# Patient Record
Sex: Male | Born: 1960
Health system: Southern US, Community
[De-identification: ages and names within clinical notes are randomized; demographics above are authoritative.]

## PROBLEM LIST (undated history)

## (undated) DIAGNOSIS — F329 Major depressive disorder, single episode, unspecified: Secondary | ICD-10-CM

## (undated) DIAGNOSIS — G4730 Sleep apnea, unspecified: Secondary | ICD-10-CM

## (undated) DIAGNOSIS — K429 Umbilical hernia without obstruction or gangrene: Secondary | ICD-10-CM

## (undated) DIAGNOSIS — K922 Gastrointestinal hemorrhage, unspecified: Secondary | ICD-10-CM

## (undated) DIAGNOSIS — G8929 Other chronic pain: Secondary | ICD-10-CM

## (undated) DIAGNOSIS — J449 Chronic obstructive pulmonary disease, unspecified: Secondary | ICD-10-CM

## (undated) DIAGNOSIS — M1990 Unspecified osteoarthritis, unspecified site: Secondary | ICD-10-CM

## (undated) DIAGNOSIS — J189 Pneumonia, unspecified organism: Secondary | ICD-10-CM

## (undated) DIAGNOSIS — R0600 Dyspnea, unspecified: Secondary | ICD-10-CM

## (undated) DIAGNOSIS — R490 Dysphonia: Secondary | ICD-10-CM

## (undated) DIAGNOSIS — I7121 Aneurysm of the ascending aorta, without rupture: Secondary | ICD-10-CM

## (undated) DIAGNOSIS — J986 Disorders of diaphragm: Secondary | ICD-10-CM

## (undated) DIAGNOSIS — I10 Essential (primary) hypertension: Secondary | ICD-10-CM

## (undated) DIAGNOSIS — G35 Multiple sclerosis: Secondary | ICD-10-CM

## (undated) DIAGNOSIS — F419 Anxiety disorder, unspecified: Secondary | ICD-10-CM

## (undated) DIAGNOSIS — G709 Myoneural disorder, unspecified: Secondary | ICD-10-CM

## (undated) DIAGNOSIS — K5903 Drug induced constipation: Secondary | ICD-10-CM

## (undated) DIAGNOSIS — T402X5A Adverse effect of other opioids, initial encounter: Secondary | ICD-10-CM

## (undated) DIAGNOSIS — F32A Depression, unspecified: Secondary | ICD-10-CM

## (undated) HISTORY — DX: Other chronic pain: G89.29

## (undated) HISTORY — PX: TONSILLECTOMY: SUR1361

## (undated) HISTORY — DX: Essential (primary) hypertension: I10

## (undated) HISTORY — DX: Depression, unspecified: F32.A

## (undated) HISTORY — DX: Dysphonia: R49.0

## (undated) HISTORY — DX: Sleep apnea, unspecified: G47.30

## (undated) HISTORY — DX: Morbid (severe) obesity due to excess calories: E66.01

## (undated) HISTORY — DX: Anxiety disorder, unspecified: F41.9

## (undated) HISTORY — PX: COLONOSCOPY W/ POLYPECTOMY: SHX1380

## (undated) HISTORY — DX: Major depressive disorder, single episode, unspecified: F32.9

## (undated) HISTORY — PX: MULTIPLE TOOTH EXTRACTIONS: SHX2053

## (undated) HISTORY — PX: SPINE SURGERY: SHX786

## (undated) HISTORY — DX: Multiple sclerosis: G35

## (undated) HISTORY — PX: ESOPHAGOGASTRODUODENOSCOPY: SHX1529

## (undated) HISTORY — DX: Chronic obstructive pulmonary disease, unspecified: J44.9

## (undated) HISTORY — PX: CARPAL TUNNEL RELEASE: SHX101

---

## 2002-03-16 ENCOUNTER — Encounter: Admission: RE | Admit: 2002-03-16 | Discharge: 2002-03-16 | Payer: Self-pay

## 2002-03-16 ENCOUNTER — Encounter: Payer: Self-pay | Admitting: Family Medicine

## 2002-03-30 ENCOUNTER — Encounter: Admission: RE | Admit: 2002-03-30 | Discharge: 2002-03-30 | Payer: Self-pay | Admitting: Family Medicine

## 2002-03-30 ENCOUNTER — Encounter: Payer: Self-pay | Admitting: Family Medicine

## 2003-01-22 ENCOUNTER — Emergency Department (HOSPITAL_COMMUNITY): Admission: EM | Admit: 2003-01-22 | Discharge: 2003-01-22 | Payer: Self-pay | Admitting: Emergency Medicine

## 2003-01-22 ENCOUNTER — Encounter: Payer: Self-pay | Admitting: Emergency Medicine

## 2003-01-31 ENCOUNTER — Encounter: Admission: RE | Admit: 2003-01-31 | Discharge: 2003-01-31 | Payer: Self-pay | Admitting: Neurosurgery

## 2003-01-31 ENCOUNTER — Encounter: Payer: Self-pay | Admitting: Neurosurgery

## 2003-05-04 ENCOUNTER — Encounter: Payer: Self-pay | Admitting: Neurosurgery

## 2003-05-04 ENCOUNTER — Ambulatory Visit (HOSPITAL_COMMUNITY): Admission: RE | Admit: 2003-05-04 | Discharge: 2003-05-05 | Payer: Self-pay | Admitting: Neurosurgery

## 2003-05-04 ENCOUNTER — Encounter (INDEPENDENT_AMBULATORY_CARE_PROVIDER_SITE_OTHER): Payer: Self-pay | Admitting: *Deleted

## 2003-07-05 ENCOUNTER — Encounter: Admission: RE | Admit: 2003-07-05 | Discharge: 2003-07-05 | Payer: Self-pay | Admitting: Neurosurgery

## 2003-10-03 ENCOUNTER — Encounter: Admission: RE | Admit: 2003-10-03 | Discharge: 2003-10-03 | Payer: Self-pay | Admitting: Neurosurgery

## 2004-01-11 ENCOUNTER — Encounter: Admission: RE | Admit: 2004-01-11 | Discharge: 2004-01-11 | Payer: Self-pay | Admitting: Neurosurgery

## 2004-02-08 ENCOUNTER — Encounter: Admission: RE | Admit: 2004-02-08 | Discharge: 2004-02-08 | Payer: Self-pay | Admitting: Neurosurgery

## 2004-04-04 ENCOUNTER — Emergency Department (HOSPITAL_COMMUNITY): Admission: EM | Admit: 2004-04-04 | Discharge: 2004-04-04 | Payer: Self-pay | Admitting: Emergency Medicine

## 2004-06-01 ENCOUNTER — Encounter: Admission: RE | Admit: 2004-06-01 | Discharge: 2004-06-01 | Payer: Self-pay | Admitting: Neurosurgery

## 2004-06-03 ENCOUNTER — Encounter: Admission: RE | Admit: 2004-06-03 | Discharge: 2004-06-03 | Payer: Self-pay | Admitting: Neurosurgery

## 2004-11-12 ENCOUNTER — Inpatient Hospital Stay (HOSPITAL_COMMUNITY): Admission: RE | Admit: 2004-11-12 | Discharge: 2004-11-13 | Payer: Self-pay | Admitting: Neurosurgery

## 2005-01-03 ENCOUNTER — Encounter: Admission: RE | Admit: 2005-01-03 | Discharge: 2005-01-03 | Payer: Self-pay | Admitting: Neurosurgery

## 2005-04-02 ENCOUNTER — Encounter: Admission: RE | Admit: 2005-04-02 | Discharge: 2005-04-02 | Payer: Self-pay | Admitting: Neurosurgery

## 2005-07-02 ENCOUNTER — Encounter: Admission: RE | Admit: 2005-07-02 | Discharge: 2005-07-02 | Payer: Self-pay | Admitting: Neurosurgery

## 2005-10-29 ENCOUNTER — Encounter: Admission: RE | Admit: 2005-10-29 | Discharge: 2005-10-29 | Payer: Self-pay | Admitting: Neurosurgery

## 2005-12-20 ENCOUNTER — Encounter: Admission: RE | Admit: 2005-12-20 | Discharge: 2005-12-20 | Payer: Self-pay | Admitting: Neurosurgery

## 2006-01-27 ENCOUNTER — Ambulatory Visit: Admission: RE | Admit: 2006-01-27 | Discharge: 2006-01-27 | Payer: Self-pay | Admitting: Neurosurgery

## 2006-02-20 ENCOUNTER — Encounter: Admission: RE | Admit: 2006-02-20 | Discharge: 2006-02-20 | Payer: Self-pay | Admitting: Neurosurgery

## 2006-02-24 ENCOUNTER — Ambulatory Visit: Payer: Self-pay | Admitting: Family Medicine

## 2006-02-27 ENCOUNTER — Encounter: Admission: RE | Admit: 2006-02-27 | Discharge: 2006-02-27 | Payer: Self-pay | Admitting: Neurosurgery

## 2006-05-21 ENCOUNTER — Encounter: Admission: RE | Admit: 2006-05-21 | Discharge: 2006-05-21 | Payer: Self-pay | Admitting: Neurosurgery

## 2006-08-26 ENCOUNTER — Encounter: Admission: RE | Admit: 2006-08-26 | Discharge: 2006-08-26 | Payer: Self-pay | Admitting: Neurosurgery

## 2006-09-09 ENCOUNTER — Encounter: Admission: RE | Admit: 2006-09-09 | Discharge: 2006-09-09 | Payer: Self-pay | Admitting: Neurosurgery

## 2007-01-07 ENCOUNTER — Inpatient Hospital Stay (HOSPITAL_COMMUNITY): Admission: RE | Admit: 2007-01-07 | Discharge: 2007-01-10 | Payer: Self-pay | Admitting: Neurosurgery

## 2007-02-05 ENCOUNTER — Encounter: Admission: RE | Admit: 2007-02-05 | Discharge: 2007-02-05 | Payer: Self-pay | Admitting: Neurosurgery

## 2007-03-19 ENCOUNTER — Encounter: Admission: RE | Admit: 2007-03-19 | Discharge: 2007-03-19 | Payer: Self-pay | Admitting: Neurosurgery

## 2007-05-21 ENCOUNTER — Encounter: Admission: RE | Admit: 2007-05-21 | Discharge: 2007-05-21 | Payer: Self-pay | Admitting: Neurosurgery

## 2007-05-27 ENCOUNTER — Encounter: Admission: RE | Admit: 2007-05-27 | Discharge: 2007-05-27 | Payer: Self-pay | Admitting: Neurosurgery

## 2007-08-03 ENCOUNTER — Ambulatory Visit: Payer: Self-pay | Admitting: *Deleted

## 2007-08-03 DIAGNOSIS — R609 Edema, unspecified: Secondary | ICD-10-CM | POA: Insufficient documentation

## 2007-08-04 ENCOUNTER — Encounter: Payer: Self-pay | Admitting: Family Medicine

## 2007-08-05 ENCOUNTER — Encounter: Payer: Self-pay | Admitting: Family Medicine

## 2007-08-05 LAB — CONVERTED CEMR LAB
Chloride: 101 meq/L (ref 96–112)
Glucose, Bld: 109 mg/dL — ABNORMAL HIGH (ref 70–99)
Potassium: 4 meq/L (ref 3.5–5.3)
Sodium: 139 meq/L (ref 135–145)
TSH: 0.757 microintl units/mL (ref 0.350–5.50)

## 2007-08-10 ENCOUNTER — Ambulatory Visit: Payer: Self-pay | Admitting: Family Medicine

## 2007-08-10 DIAGNOSIS — N319 Neuromuscular dysfunction of bladder, unspecified: Secondary | ICD-10-CM | POA: Insufficient documentation

## 2007-08-12 ENCOUNTER — Telehealth (INDEPENDENT_AMBULATORY_CARE_PROVIDER_SITE_OTHER): Payer: Self-pay | Admitting: *Deleted

## 2007-08-12 ENCOUNTER — Ambulatory Visit: Payer: Self-pay | Admitting: Family Medicine

## 2007-08-12 LAB — CONVERTED CEMR LAB
Albumin: 4.5 g/dL (ref 3.5–5.2)
Bilirubin Urine: NEGATIVE
Glucose, Urine, Semiquant: NEGATIVE
Specific Gravity, Urine: 1.015
WBC Urine, dipstick: NEGATIVE
pH: 6.5

## 2007-08-13 ENCOUNTER — Telehealth: Payer: Self-pay | Admitting: Family Medicine

## 2007-08-14 ENCOUNTER — Telehealth: Payer: Self-pay | Admitting: Family Medicine

## 2007-08-14 ENCOUNTER — Encounter: Payer: Self-pay | Admitting: Family Medicine

## 2007-08-14 LAB — CONVERTED CEMR LAB
Basophils Absolute: 0 10*3/uL (ref 0.0–0.1)
Basophils Relative: 0 % (ref 0–1)
Eosinophils Relative: 5 % (ref 0–5)
HCT: 45.3 % (ref 39.0–52.0)
Hemoglobin: 14.8 g/dL (ref 13.0–17.0)
MCHC: 32.7 g/dL (ref 30.0–36.0)
Monocytes Absolute: 0.1 10*3/uL (ref 0.1–1.0)
RDW: 12.8 % (ref 11.5–15.5)

## 2007-08-18 ENCOUNTER — Ambulatory Visit: Payer: Self-pay | Admitting: Family Medicine

## 2007-08-18 DIAGNOSIS — M542 Cervicalgia: Secondary | ICD-10-CM | POA: Insufficient documentation

## 2007-08-18 DIAGNOSIS — I872 Venous insufficiency (chronic) (peripheral): Secondary | ICD-10-CM | POA: Insufficient documentation

## 2007-08-18 DIAGNOSIS — F418 Other specified anxiety disorders: Secondary | ICD-10-CM | POA: Insufficient documentation

## 2007-08-18 DIAGNOSIS — F329 Major depressive disorder, single episode, unspecified: Secondary | ICD-10-CM | POA: Insufficient documentation

## 2007-08-22 ENCOUNTER — Ambulatory Visit (HOSPITAL_COMMUNITY): Admission: RE | Admit: 2007-08-22 | Discharge: 2007-08-22 | Payer: Self-pay | Admitting: Neurosurgery

## 2007-08-22 ENCOUNTER — Encounter: Admission: RE | Admit: 2007-08-22 | Discharge: 2007-08-22 | Payer: Self-pay | Admitting: Neurosurgery

## 2007-09-17 ENCOUNTER — Encounter: Payer: Self-pay | Admitting: Family Medicine

## 2007-10-02 ENCOUNTER — Encounter: Payer: Self-pay | Admitting: Family Medicine

## 2007-11-17 ENCOUNTER — Encounter: Payer: Self-pay | Admitting: Family Medicine

## 2007-11-19 ENCOUNTER — Encounter: Payer: Self-pay | Admitting: Family Medicine

## 2008-01-14 ENCOUNTER — Encounter: Admission: RE | Admit: 2008-01-14 | Discharge: 2008-01-14 | Payer: Self-pay | Admitting: Family Medicine

## 2008-06-28 ENCOUNTER — Encounter: Admission: RE | Admit: 2008-06-28 | Discharge: 2008-06-28 | Payer: Self-pay | Admitting: Neurosurgery

## 2008-07-02 ENCOUNTER — Encounter: Admission: RE | Admit: 2008-07-02 | Discharge: 2008-07-02 | Payer: Self-pay | Admitting: Neurosurgery

## 2008-08-01 ENCOUNTER — Ambulatory Visit: Payer: Self-pay | Admitting: Surgery

## 2008-08-02 ENCOUNTER — Ambulatory Visit: Payer: Self-pay | Admitting: Vascular Surgery

## 2008-09-11 ENCOUNTER — Ambulatory Visit: Payer: Self-pay | Admitting: Diagnostic Radiology

## 2008-09-11 ENCOUNTER — Emergency Department (HOSPITAL_BASED_OUTPATIENT_CLINIC_OR_DEPARTMENT_OTHER): Admission: EM | Admit: 2008-09-11 | Discharge: 2008-09-11 | Payer: Self-pay | Admitting: Emergency Medicine

## 2008-11-19 ENCOUNTER — Encounter: Admission: RE | Admit: 2008-11-19 | Discharge: 2008-11-19 | Payer: Self-pay | Admitting: Neurosurgery

## 2009-06-11 ENCOUNTER — Encounter: Admission: RE | Admit: 2009-06-11 | Discharge: 2009-06-11 | Payer: Self-pay | Admitting: Anesthesiology

## 2009-12-20 ENCOUNTER — Encounter: Admission: RE | Admit: 2009-12-20 | Discharge: 2009-12-20 | Payer: Self-pay | Admitting: Neurosurgery

## 2009-12-27 ENCOUNTER — Encounter: Admission: RE | Admit: 2009-12-27 | Discharge: 2009-12-27 | Payer: Self-pay | Admitting: Neurosurgery

## 2010-01-31 ENCOUNTER — Ambulatory Visit (HOSPITAL_COMMUNITY): Admission: RE | Admit: 2010-01-31 | Discharge: 2010-02-01 | Payer: Self-pay | Admitting: Neurosurgery

## 2010-03-06 ENCOUNTER — Encounter: Admission: RE | Admit: 2010-03-06 | Discharge: 2010-03-06 | Payer: Self-pay | Admitting: Neurosurgery

## 2010-04-05 ENCOUNTER — Encounter: Admission: RE | Admit: 2010-04-05 | Discharge: 2010-04-05 | Payer: Self-pay | Admitting: Neurosurgery

## 2010-08-12 ENCOUNTER — Encounter: Payer: Self-pay | Admitting: Neurosurgery

## 2010-10-07 LAB — CBC
MCHC: 34.8 g/dL (ref 30.0–36.0)
RDW: 13.6 % (ref 11.5–15.5)

## 2010-10-07 LAB — SURGICAL PCR SCREEN
MRSA, PCR: NEGATIVE
Staphylococcus aureus: NEGATIVE

## 2010-12-04 NOTE — Procedures (Signed)
DUPLEX DEEP VENOUS EXAM - LOWER EXTREMITY   INDICATION:  Bilateral lower extremity edema.   HISTORY:  Edema:  Bilateral lower extremity edema since 2008.  Trauma/Surgery:  No.  Pain:  Bilateral lower extremity pain.  PE:  No.  Previous DVT:  No.  Anticoagulants:  No.  Other:  Patient claims that prednisone resolves the edema in his legs.   DUPLEX EXAM:                CFV   SFV   PopV  PTV    GSV                R  L  R  L  R  L  R   L  R  L  Thrombosis    o  o  o  o  o  o  o   o  o  o  Spontaneous   +  +  +  +  +  +  +   +  +  +  Phasic        +  +  +  +  +  +  +   +  +  +  Augmentation  +  +  +  +  +  +  +   +  +  +  Compressible  +  +  +  +  +  +  +   +  +  +  Competent     +  o  +  o  +  +  +   +  +  P   Legend:  + - yes  o - no  p - partial  D - decreased   IMPRESSION:  1. No evidence of deep or superficial vein thrombosis.  No evidence of      baker's cyst bilaterally.  2. Deep vein incompetence is seen in the left common femoral vein and      left superficial femoral vein.    _____________________________  V. Charlena Cross, MD   MC/MEDQ  D:  08/01/2008  T:  08/01/2008  Job:  [id number]

## 2010-12-04 NOTE — Op Note (Signed)
NAME:  Mark [name], Mark [name]           ACCOUNT NO.:  [id number]   MEDICAL RECORD NO.:  [id number]          PATIENT TYPE:  INP   LOCATION:  3172                         FACILITY:  [facility]   PHYSICIAN:  Donalee Citrin, M.D.        DATE OF BIRTH:  02/16/1961   DATE OF PROCEDURE:  01/07/2007  DATE OF DISCHARGE:                               OPERATIVE REPORT   PREOPERATIVE DIAGNOSIS:  Degenerative disc disease, spinal instability  L4-L5, with bilateral L5 radiculopathy.   POSTOPERATIVE DIAGNOSIS:  Degenerative disc disease, spinal instability  L4-L5, with bilateral L5 radiculopathy.   PROCEDURE:  Re-exploration of lumbar fusion L5-S1, removal of hardware  L5-S1, decompressive lumbar laminotomy at L4-L5, posterior lumbar  interbody fusion L4-L5 using a Hybrid Telamon 12 x 22 mm PEEK cage  packed with locally harvested bone graft mixed with Progenics bone  substitute as well as a Tangent 12 x 26 mm allograft wedge, pedicle  screw fixation L4 to S1 using the 6.35 Legacy pedicle screw system,  posterolateral arthrodesis, L4 to S1, using locally harvested bone mixed  with bone substitute   SURGEON:  Donalee Citrin, M.D.   ASSISTANT:  Tia Alert, M.D.   ANESTHESIA:  General endotracheal anesthesia.   HISTORY OF PRESENT ILLNESS:  The patient is a very pleasant 50 year old  gentleman who underwent L5-S1 interbody fusion a couple of years ago.  He did very well initially; however, about a year postop started  developing progressive worsening back pain refractory to all forms of  treatment  including facet injections, epidural steroid injections,  physical therapy, anti-inflammatories, narcotic pain medication. Repeat  imaging with MRI scan diskography showed concordant responses at L4-L5  with solid bony fusion at L5-S1 and a negative response at L3-L4.  Due  to the patient's failure with conservative treatment, diskogram and  imaging findings, the patient is recommended extension of his fusion to  L4-L5 and exploration of his fusion at L5-S1.  The risks and benefits of  the operation were explained to the patient, he understood and agreed to  proceed forth.   DESCRIPTION OF PROCEDURE:  The patient was brought to the OR and was  induced under general anesthesia.  He was placed prone on the Wilson  frame.  The back was prepped and draped in the usual sterile fashion.  No preoperative was needed.  The old incision was opened and extended  cephalad. Scar tissue was dissected free and the subperiosteal  dissection carried out at the lamina and spinous processes at L4  exposing L4.  Then, the hardware was identified at L5-S1. The fusion was  inspected and felt to be solid.  There was a lot of bony ingrowth on the  pedicle screws at L5-S1, the top were removed, and the rod was removed.  The patient requested leaving the S1 screws behind so this was left  behind.  Then, the spinous process at L4 was removed.  Extensive scar  tissue was dissected free at the L4-L5 disc space and complete medial  facetectomies were performed at L4-L5. There was a marked stenosis and  marked diastasis of the facet complex  as well as a synovial cyst coming  off the canalicular aspect of the L4-L5 facet on the right. After all  the foraminotomies were completed at L4 and L5, attention was turned to  the disc space.  A large central disc herniation was appreciated.  Annulotomy was made with an 11 blade scalpel on the right side.  This  was cleaned out. A size 10 distractor was inserted.  This was felt to be  inadequate so a size 12 distractor was then subsequently inserted.  This  had good apposition of the endplates and felt to be the appropriate size  for the interbody spacers.  Then, the left side disc space was cleaned  out using a size 12 cutter and chisel and fluoroscopy.  At each step  along the way, the interspace was cleaned out and prepared for the  allograft.  A 12 x 20 mm Telamon PEEK cage mixed with  locally harvested  bone graft mixed with bone substitute was packed in the left sided  interspace approximated 2-3 mm deep to posterior vertebral line.  Fluoroscopy confirmed good position of the interbody wedge.  Then, the  right side distractor was removed.  The disc space cleaned out in a  similar fashion, central disc was removed and scraped, and the remainder  of the locally harvested autograft was packed against the left sided  Telamon and the right sided Tangent wedge was inserted. After all  interbody space had been done, attention was turned to pedicle screw  placement.  A pilot hole was drilled, fluoroscopy confirming trajectory  and position and both intracanalicular and extracanalicular inspection  confirmed no mediolateral breach A 6.5 by 50 screw was inserted on the  left with a 6.5 by 50 inserted on the right subsequently. After all  screws had been inserted, aggressive irrigation was performed and  decortication with a high speed drill with T-piece and lateral gutters  down to the fusion mass at L5-S1. The remainder of the locally harvested  bone graft was packed in the lateral gutters.  A 70 mm rod was inserted  at S1 and L5. The L4 screw was then compressed against L5.  A cross-link  was inserted.  The foramina were reinspected prior to crossing placement  to confirm no migration of graft material and confirm patency. Gelfoam  was then overlaid in the dura.  The muscle and fascia were  reapproximated after placement of a medium Hemovac drain with  interrupted Vicryls and the skin was closed with interrupted Vicryl as  well with Dermabond, Steri-Strips and dressed. At the end of the case,  sponge, instrument, and needle counts were correct.           ______________________________  Donalee Citrin, M.D.     GC/MEDQ  D:  01/07/2007  T:  01/07/2007  Job:  [id number]

## 2010-12-04 NOTE — Assessment & Plan Note (Signed)
OFFICE VISIT   Mark [name]  DOB:  [birth year]                                       08/01/2008  EAVWU#:[id number]   REASON FOR VISIT:  Evaluate leg swelling.   HISTORY:  This is [name] 50 year old gentleman I am seeing at the request of  Dr. Hilma Favors for evaluation of bilateral leg swelling, right greater  than left.  The patient states that he initially began having problems  with leg swelling in December 2008.  He attributes this to taking  Levaquin for pneumonia.  The swelling has stayed about the same.  It has  not gotten or has not improved.  The right leg is worse than the left.  He has taken some prednisone in the past and this did help improve his  symptoms.  He has had minimal relief with diuretics.  He does not have  any ulceration.  Of note, he has had significant weight gain.   REVIEW OF SYSTEMS:  GENERAL:  Positive for weight gain.  He weighs 339  pounds.  CARDIAC:  Negative.  PULMONARY:  Positive for sleep apnea.  GI:  Positive for constipation.  GU:  Negative.  VASCULAR:  He has pain in the legs with walking.  NEURO:  Negative.  ORTHOPEDIC:  Positive for arthritis, joint pain, muscle pain.  PSYCHIATRIC:  Negative.  ENT:  Negative.  HEMATOLOGIC:  Negative.   PAST MEDICAL HISTORY:  Chronic back pain, edema, gout, impotence, carpal  tunnel syndrome, sleep apnea, benign prostatic hypertrophy.   FAMILY HISTORY:  Negative for cardiovascular disease.   SOCIAL HISTORY:  He is married with 2 children.  Works in an [employer]ing [employer].  Does not smoke, as never smoked.  Drinks occasional alcohol.   MEDICATIONS:  Hydrocodone, diazepam, diclofenac and Xenical.   ALLERGIES:  None.   PHYSICAL EXAMINATION:  Blood pressure is 134/80 pulse is 78, temperature  is 98.0.  General:  He is well-appearing, in no distress.  Normocephalic, atraumatic.  Cardiovascular:  Regular rate and rhythm.  Pulmonary:  Lungs are clear bilaterally.  Abdomen:  Obese.   Extremities  are warm and well-perfused.  Right leg has pitting edema up to the knee,  more pronounced than on the left leg.  There is no evidence of ischemia.  There is no ulceration.  He has mild pigment changes in the right gaiter  region.   DIAGNOSTIC STUDIES:  The patient underwent venous ultrasound today.  This reveals no evidence of deep venous thrombosis superficial venous  thrombosis or Baker cyst bilaterally.  There is deep vein incompetence  in the left common femoral vein and the left superficial femoral vein.   ASSESSMENT:  Bilateral leg swelling, right greater than left.   PLAN:  The patient most likely has [name] lymphatic etiology causing his  swelling.  For that reason I think we will need to treat this medically.  I have recommended that he begin wearing compression therapy 20-30 mmHg.  I also recommended that he see [name] lymphedema therapist for massage  therapy as well as encouraged him to keep his leg elevated whenever  possible.   He does have evidence of venous insufficiency.  However, this is not  something that can be treated with laser ablation as it is mainly in his  deep system.  We discussed the need to control his swelling as this  can  lead to ulcerations in the future.  I think he would benefit from weight  loss.  I do not think this is attributed to the Levaquin.   Mark Ny, MD  Electronically Signed   VWB/MEDQ  D:  08/01/2008  T:  08/02/2008  Job:  [id number]   cc:   Mark [name]

## 2010-12-04 NOTE — Discharge Summary (Signed)
NAME:  Mark [name], Mark [name]           ACCOUNT NO.:  [id number]   MEDICAL RECORD NO.:  [id number]          PATIENT TYPE:  INP   LOCATION:  3006                         FACILITY:  [facility]   PHYSICIAN:  Donalee Citrin, M.D.        DATE OF BIRTH:  March 18, 1961   DATE OF ADMISSION:  01/07/2007  DATE OF DISCHARGE:  01/10/2007                               DISCHARGE SUMMARY   ADMISSION DIAGNOSIS:  Degenerative disk disease, lumbar spinal stenosis  at L4-5.   PROCEDURE:  Decompressive lumbar laminectomy, posterior lumbar body  fusion, L4-5, evaluation of fusion, L5-S1, extension hardware.   HISTORY OF PRESENT ILLNESS:  Patient was admitted and went to the  operating room and underwent the aforementioned procedure.  Postop,  patient did very well, had significant improvement of his leg pain and  hip pain.  Back pain progressively got better.  Maintained control with  p.o. pain medications with pills and muscle relaxers over the first  couple of days postoperatively.   On day #3 postop, patient was ambulating and voiding spontaneously,  tolerating a regular diet.  Was able to be discharged home with  scheduled followup in two weeks.  At the time of discharge, patient was  ambulating, voiding, and tolerating p.o. pain medication for management.           ______________________________  Donalee Citrin, M.D.     GC/MEDQ  D:  02/05/2007  T:  02/05/2007  Job:  [id number]
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
## 2010-12-07 NOTE — Op Note (Signed)
NAME:  Mark [name], Mark [name]           ACCOUNT NO.:  [id number]   MEDICAL RECORD NO.:  [id number]          PATIENT TYPE:  INP   LOCATION:  2899                         FACILITY:  [facility]   PHYSICIAN:  Donalee Citrin, M.D.        DATE OF BIRTH:  08-19-1960   DATE OF PROCEDURE:  11/12/2004  DATE OF DISCHARGE:                                 OPERATIVE REPORT   PREOPERATIVE DIAGNOSES:  1.  Degenerative disk disease.  2.  Ruptured disk L5-S1, left greater than right with S1 radiculopathy.  3.  Mechanical and discogenic low back pain.   POSTOPERATIVE DIAGNOSES:  1.  Degenerative disk disease.  2.  Ruptured disk L5-S1, left greater than right with S1 radiculopathy.  3.  Mechanical and discogenic low back pain.   PROCEDURE:  Posterior lumbar interbody fusion L5-S1 using 10 x 26 mm  translaminar pedicle screw and fixation L5-S1 using the M10 Legacy pedicle  screw system.  Posterolateral arthrodesis L5-S1 using locally harvested  autograft. Placement of Hemovac drain.   SURGEON:  Donalee Citrin, M.D.   ASSISTANT:  Kathaleen Maser. Pool, M.D.   ANESTHESIA:  General endotracheal.   INDICATIONS FOR PROCEDURE:  The patient is a very pleasant, 50 year old  gentleman with longstanding back and left leg pain, refractory to all forms  of conservative treatment with physical therapy, facet injections, epidural  steroid injections, anti-inflammatories and time.  The patient has only  responded to the facet blocks but only for a temporary basis.  His  preoperative imaging showed severe degenerative collapse at the L5-S1 disk  space and large central disk rupture.  Due to the patient's failure of  conservative treatment, his symptoms of predominantly mechanical and  discogenic low back pain with secondary radiculopathy, the patient was  recommended decompressive stabilization procedure.   The risks and benefits of surgery were counseled with him previously.   DESCRIPTION OF PROCEDURE:  The patient was brought to the  OR, was inducted  under general anesthesia, positioned prone on the Wilson frame.  Back was  prepped and draped in the usual sterile fashion.  Preoperative x-ray  localized the L5-S1 disk space. After infiltrating with local anesthesia, a  midline incision was made, Bovie electrocautery was used to take down  subcutaneous tissues and subperiosteal dissection was carried out to lamina  of L4-5 and S1, exposing the TPs at L5 and S1.  Intraoperative x-ray  confirmed proper position.   The L5 pedicle, spinous process and laminar complexes were removed at L5 and  complete medial facectomies were performed. The facet was noted to be  markedly degenerated and hypertrophied. The ligament was also noted to be  hypertrophied. The L5 nerve root was noted to be under a tremendous amount  of stenosis due to the large, central disk bulge and collapse, closing off  the L5 neuroforaminal bilaterally, worse than the left. This was all under-  bent and opened up with a 3 mm Kerrison punch.  Lateral facet complex was  also under-bitten and the lateral disk space was exposed.  The S1 nerve root  was decompressed out of its foramen, flush with  the pedicle.   After both neuroforamina were opened up, attention was taken to the disk  space. Working using D'Errico nerve retractor, the right S1 nerve root was  reflected medially.  There was noted to be large, posterior osteophytic  spurs which were all scraped off and bitten away. The disk space was cleaned  out. The size 8 retractor was inserted. This kind of fish-mouthed and I  decided I wanted to replace it with a 10 distractor.  The 10 distractor had  good apposition of the endplates on the right. Then on the left side  interspace was again cleaned out.  Several large osteophytes were removed  from the central compartment as well as disk fragments were cleaned out.  Then using a size 10 cutter and chisel, the endplates were scraped and  prepared to receive  bone graft.  Using the ring chisel, the S1 endplate was  also scraped again.  This prepared the endplates.   A 10 x 26 mm __________ graft was inserted under fluoroscopy, confirming  each step along the way and good position of the bone graft after placement.  Then the right side distractor was removed. right side disk space was again  prepared in a similar fashion.  Locally harvested autograft was __________  and allograft from the right side inserted.  Fluoroscopy confirmed good  position of the allografts.   Then attention was taken to pedicle screw placement.  Using bony landmarks  from within the canal as well as extra-foraminally, the lateral aspect of  the L5 pedicle was identified.  A pilot hole was drilled, confirmed with  fluoroscopy, cannulated with the awl, probed from within the pedicle as well  as within the canal, tapped with a 5.5 tap and 6.5 x 45 pedicle screws  inserted L5 on the left. This procedure was repeated on the left at S1 and  on the right at L5 and S1. All 4 pedicle screws had excellent purchase,  confirmed by fluoroscopy trajectory, each step along the way.  Each pedicle  was probed from within the pedicle, as well as within the canal to confirm  no medial or lateral breach.   After all of our screws had been placed, decortication was carried out in  the lateral facet complexes.  Locally harvested allograft was packed in the  lateral gutters. Then a 40 mm rod was sized, selected and inserted, top nuts  tightened down at S1. The L5 pedicle screw was decompressed against the S1.  The neuroforamina were then reexplored with angled nerve hook and noted to  have no further stenosis and to be widely patent.   Then a medium Hemovac drain was placed.  Gelfoam was overlaid at the top of  the dura. The muscle and fascia were reapproximated with 0 interrupted Vicryl and subcutaneous tissues closed with 2 layer of interrupted Vicryl  and skin was closed with running  4-0 subcuticular.  Benzoin and Steri-Strips  were applied.  The patient went to the recovery room in stable condition.  At the end of the case, all sponge counts and needle counts were correct.      GC/MEDQ  D:  11/12/2004  T:  11/12/2004  Job:  [id number]

## 2011-01-28 ENCOUNTER — Other Ambulatory Visit: Payer: Self-pay | Admitting: Neurosurgery

## 2011-01-28 DIAGNOSIS — M545 Low back pain, unspecified: Secondary | ICD-10-CM

## 2011-01-30 ENCOUNTER — Ambulatory Visit
Admission: RE | Admit: 2011-01-30 | Discharge: 2011-01-30 | Disposition: A | Discharge status: Home / Self Care | Payer: BC Managed Care – PPO | Source: Ambulatory Visit | Attending: Neurosurgery | Admitting: Neurosurgery

## 2011-01-30 DIAGNOSIS — M545 Low back pain, unspecified: Secondary | ICD-10-CM

## 2011-01-30 MED ORDER — GADOBENATE DIMEGLUMINE 529 MG/ML IV SOLN: 20.0000 mL | Freq: Once | INTRAVENOUS | Status: DC | PRN

## 2011-01-30 MED ORDER — GADOBENATE DIMEGLUMINE 529 MG/ML IV SOLN
20.0000 mL | Freq: Once | INTRAVENOUS | Status: AC | PRN
  Administered 2011-01-30: 20 mL via INTRAVENOUS

## 2011-03-26 ENCOUNTER — Encounter (HOSPITAL_COMMUNITY)
Admission: RE | Admit: 2011-03-26 | Discharge: 2011-03-26 | Disposition: A | Discharge status: Home / Self Care | Payer: BC Managed Care – PPO | Source: Ambulatory Visit | Attending: Neurosurgery | Admitting: Neurosurgery

## 2011-03-26 LAB — SURGICAL PCR SCREEN: Staphylococcus aureus: POSITIVE — AB

## 2011-03-26 LAB — TYPE AND SCREEN: Unit division: 0

## 2011-03-26 LAB — CBC
HCT: 39.7 % (ref 39.0–52.0)
Hemoglobin: 13.8 g/dL (ref 13.0–17.0)
MCH: 29.9 pg (ref 26.0–34.0)
MCV: 85.9 fL (ref 78.0–100.0)
RBC: 4.62 MIL/uL (ref 4.22–5.81)
WBC: 8 10*3/uL (ref 4.0–10.5)

## 2011-04-01 ENCOUNTER — Inpatient Hospital Stay (HOSPITAL_COMMUNITY): Payer: BC Managed Care – PPO

## 2011-04-01 ENCOUNTER — Inpatient Hospital Stay (HOSPITAL_COMMUNITY)
Admission: RE | Admit: 2011-04-01 | Discharge: 2011-04-04 | DRG: 756 | Disposition: A | Discharge status: Home / Self Care | Payer: BC Managed Care – PPO | Source: Ambulatory Visit | Attending: Neurosurgery | Admitting: Neurosurgery

## 2011-04-01 DIAGNOSIS — Z01812 Encounter for preprocedural laboratory examination: Secondary | ICD-10-CM

## 2011-04-01 DIAGNOSIS — Z8659 Personal history of other mental and behavioral disorders: Secondary | ICD-10-CM

## 2011-04-01 DIAGNOSIS — M48061 Spinal stenosis, lumbar region without neurogenic claudication: Principal | ICD-10-CM | POA: Diagnosis present

## 2011-04-01 DIAGNOSIS — M5137 Other intervertebral disc degeneration, lumbosacral region: Secondary | ICD-10-CM | POA: Diagnosis present

## 2011-04-01 DIAGNOSIS — M51379 Other intervertebral disc degeneration, lumbosacral region without mention of lumbar back pain or lower extremity pain: Secondary | ICD-10-CM | POA: Diagnosis present

## 2011-04-01 DIAGNOSIS — Z888 Allergy status to other drugs, medicaments and biological substances status: Secondary | ICD-10-CM

## 2011-04-02 LAB — POCT I-STAT 4, (NA,K, GLUC, HGB,HCT)
Glucose, Bld: 129 mg/dL — ABNORMAL HIGH (ref 70–99)
HCT: 38 % — ABNORMAL LOW (ref 39.0–52.0)
Hemoglobin: 12.9 g/dL — ABNORMAL LOW (ref 13.0–17.0)

## 2011-04-18 NOTE — Op Note (Signed)
Mark [name], Mark [name]           ACCOUNT NO.:  [id number]  MEDICAL RECORD NO.:  [id number]  LOCATION:  3004                         FACILITY:  [facility]  PHYSICIAN:  Donalee Citrin, M.D.        DATE OF BIRTH:  [birth year]  DATE OF PROCEDURE:  04/01/2011 DATE OF DISCHARGE:  03/26/2011                              OPERATIVE REPORT   PREOPERATIVE DIAGNOSES: 1. Lumbar spinal stenosis, both congenital and acquired L2-3, L3-4. 2. Degenerative disease at L2-3 and L3-4 with bilateral L3-L4     radiculopathies and mechanical back pain.  PROCEDURE:  Exploration of fusion and removal of hardware at L4-S1, decompressive lumbar laminectomies and posterior lumbar interbody fusion at L2-3 and L3-4 using 14 x 22 mm Telamon PEEK cages, packed with locally harvested autograft mixed with morselized allograft and BMP. Posterolateral arthrodesis L2-L4 using local autograft mixed with morselized allograft and BMP.  The pedicle screw fixation at L2-L4 using the Sofamor Danek 6.35 legacy pedicle screw system and placement of large Hemovac drain.  SURGEON:  Ronney Lion, M.D.  ASSISTANT:  Kathaleen Maser. Pool, MD.  ANESTHESIA:  General endotracheal.  HISTORY OF PRESENT ILLNESS:  The patient is a very pleasant 50-year gentleman with a longstanding difficulty with his back.  He has undergone previous L5-S1 fusion, subsequent extension at L4-5 and progressively broken down and got additional stenotics at L3-4 and L2-3. 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  After he failed all forms of conservative treatment, it was recommended re-exploration of fusion and removal of hardware as well as decompression and stabilization procedures at L2-3, L3-4 and due to the patient's size, 2 levels of previous fusion and defect at the 2 additional levels, the patient was advised to get a redo reexploration of fusion and  removal of hardware, decompression and stabilization procedure using morselized allograft along with locally harvested allograft and BMP.  The risks and benefits of the operation were explained to the patient.  He understood and agreed to proceed forward.  DESCRIPTION OF PROCEDURE:  The patient was brought to the OR, was induced under general anesthesia, and positioned supine.  Back was prepped and draped in usual sterile fashion.  The old incision was opened up and extended cephalad.  The scar tissue was dissected free and subperiosteal dissection carried down to the lamina of  L2-L3 bilaterally exposing the T-piece at L2, L3 and L4.  The hardware was then identified.  The previous fusion was felt to be solid so the hardware was removed with cross-link rods.  The top tightening nuts and screws at L5-S1 were also removed, leaving the screws 4 behind.  Then the scar tissue dissected free and complete decompression was performed centrally by removing the spinous processes at L2-L3 respectively.  The central decompression was begun work to the scar tissue and complete medial facetectomies were performed at L2-3, L3-4.  The L3-L2, L3-L4 nerve roots were then skeletonized, flushed with pedicle, marked facet arthropathy and severe hourglass compression of thecal sac from congenital stenosis on top of acquired stenosis, especially down the 3-4 interspace and this was all teased away with a  Penfield 4 and the medial facetectomies were performed bilaterally.  After the complete decompression had been completed, attention was taken to pedicle screw placement.  Using a high-speed drill, pilot holes were drilled at L2 on the right, cannulated with the awl, probed, tapped with a 5/5 tap, probed again, and 6 x 45 screw was inserted at L3 on the right.  In a similar fashion 6 x 50 screws were inserted at L3 on the left side. Same size screws were inserted with 4 screws that have been left from the  previous surgery.  After all 4 screws in place, 6 screws in total, the interbody work was done.  Disk space was incised and cleaned out. Disk space was very large and we could necessitate a 14-distractor so then using a size 12 rotating cutter and a size 14 chisel, endplates were prepped.  Central disk was scraped away.  This was cleaned up through 2 rongeurs.  A 14 x 22 Telamon PEEK cage packed with local autograft mixed with morselized allograft was then inserted on the patient's right side.  Distractor was removed.  Fluoroscopy was used on each step along the way to confirm good position.  Then the disk space again was cleaned from the left, local autograft and BMP was then packed centrally with morselized allograft and the left side Telamon PEEK cage was placed.  Then in a similar fashion, the interbody work was done at L2-3.  Again fluoroscopy was used on each step along the way and endplates were all scraped both centrally and laterally.  At the end of decompression and implant placement, all the foramina re-explored to confirm patency.  Aggressive decortication was carried out at T-piece and lateral gutters posterolaterally.  The autograft, allograft and BMP were then packed posterolaterally.  Then a 100-mm rod was placed.  Top tightening nuts were tightened down at L4.  The L3 screws compressed against L4 and L2 compressed against L3.  Prior to placing the cross- link, the neural foramina were all reinspected to confirm no migration of graft material.  Gelfoam was laid atop the dura.  Large Hemovac drain was placed and then the wound was closed in layers sequentially with interrupted Vicryl and running 4-0 subcuticular.  Benzoin and Steri- Strips were applied.  The patient went to recovery room in stable condition.  At the end of the case, all sponge, needle, and instrument counts were correct.          ______________________________ Donalee Citrin, M.D.     GC/MEDQ  D:   04/01/2011  T:  04/02/2011  Job:  [id number]  Electronically Signed by Donalee Citrin M.D. on 04/18/2011 01:34:01 PM

## 2011-04-18 NOTE — H&P (Signed)
  NAMELEMOYNE, NESTOR NO.:  [id number]  MEDICAL RECORD NO.:  [id number]  LOCATION:  3004                         FACILITY:  [facility]  PHYSICIAN:  Donalee Citrin, M.D.        DATE OF BIRTH:  12-11-1960  DATE OF ADMISSION:  04/01/2011 DATE OF DISCHARGE:                             HISTORY & PHYSICAL   ADMITTING DIAGNOSES:  Lumbar spondylosis, __________ spinal stenosis , degenerative disk disease  L3-L4 and L2-L3.  PROCEDURE:  __________ disease, L2-L3, L3-L4 __________.  SURGEON:  Donalee Citrin, MD  ANESTHESIA:  General endotracheal.  HISTORY OF PRESENT ILLNESS:  The patient is a very pleasant __________  PAST MEDICAL HISTORY:  Fairly remarkable for history of depression, anxiety, arthritis.  PAST SURGICAL HISTORY:  Thoracic laminectomy L4 to S1, two different operations, cervical fusion with carpal tunnel release, tonsillectomy.  DRUG ALLERGIES:  LEVAQUIN.  CURRENT MEDICATIONS:  __________.  PHYSICAL EXAMINATION:  GENERAL:  The patient is very pleasant, awake, alert and oriented __________ reflexes and sensation.  IMAGING:  MRI scan and CT scan shows severe lumbar spinal stenosis.  ASSESSMENT/PLAN:  The patient  presents for decompression and stabilization procedure.          ______________________________ Donalee Citrin, M.D.     GC/MEDQ  D:  04/01/2011  T:  04/01/2011  Job:  [id number]  Electronically Signed by Donalee Citrin M.D. on 04/18/2011 01:33:58 PM

## 2011-05-01 NOTE — Discharge Summary (Signed)
  NAMECARLE, FENECH           ACCOUNT NO.:  [id number]  MEDICAL RECORD NO.:  [id number]  LOCATION:  3004                         FACILITY:  [facility]  PHYSICIAN:  Donalee Citrin, M.D.        DATE OF BIRTH:  January 07, 1961  DATE OF ADMISSION:  04/01/2011 DATE OF DISCHARGE:  04/04/2011                              DISCHARGE SUMMARY   ADMITTING DIAGNOSES: 1. Degenerative disk disease. 2. Lumbar spinal stenosis, L3-4.  PROCEDURE DURING THIS HOSPITALIZATION: 1. Exploration of fusion and removal of hardware, L4-S1. 2. Posterior lumbar interbody fusion. 3. Decompressive laminectomy, L2-3 and L3-4.  HOSPITAL COURSE:  The patient was admitted, went to the operating room, and underwent the aforementioned procedure.  Postoperatively, the patient did very well with significant improvement of his leg pain, had a lot of back pain.  He was progressively mobilized with physical and occupational therapy.  He was discontinued on his PCA.  He was started on OxyContin to control his back pain.  Foley was able to be taken out on day 2, and he was voiding and was subsequently able to be discharged home on postoperative day 4 with scheduled followup in approximately 2 weeks.          ______________________________ Donalee Citrin, M.D.     GC/MEDQ  D:  04/18/2011  T:  04/19/2011  Job:  [id number]  Electronically Signed by Donalee Citrin M.D. on 05/01/2011 03:18:55 PM

## 2011-05-09 LAB — TYPE AND SCREEN
ABO/RH(D): O NEG
Antibody Screen: NEGATIVE

## 2011-05-09 LAB — CBC
MCHC: 34.2
RBC: 4.51
WBC: 6.6

## 2011-05-16 ENCOUNTER — Other Ambulatory Visit: Payer: Self-pay | Admitting: Neurosurgery

## 2011-05-16 ENCOUNTER — Ambulatory Visit
Admission: RE | Admit: 2011-05-16 | Discharge: 2011-05-16 | Disposition: A | Discharge status: Home / Self Care | Payer: BC Managed Care – PPO | Source: Ambulatory Visit | Attending: Neurosurgery | Admitting: Neurosurgery

## 2011-05-16 DIAGNOSIS — M25539 Pain in unspecified wrist: Secondary | ICD-10-CM

## 2011-05-16 DIAGNOSIS — M545 Low back pain, unspecified: Secondary | ICD-10-CM

## 2011-05-16 DIAGNOSIS — M5137 Other intervertebral disc degeneration, lumbosacral region: Secondary | ICD-10-CM

## 2011-05-16 DIAGNOSIS — W19XXXA Unspecified fall, initial encounter: Secondary | ICD-10-CM

## 2011-06-27 ENCOUNTER — Other Ambulatory Visit: Payer: Self-pay | Admitting: Neurosurgery

## 2011-06-27 ENCOUNTER — Ambulatory Visit
Admission: RE | Admit: 2011-06-27 | Discharge: 2011-06-27 | Disposition: A | Discharge status: Home / Self Care | Payer: BC Managed Care – PPO | Source: Ambulatory Visit | Attending: Neurosurgery | Admitting: Neurosurgery

## 2011-06-27 DIAGNOSIS — M545 Low back pain, unspecified: Secondary | ICD-10-CM

## 2011-09-12 ENCOUNTER — Ambulatory Visit
Admission: RE | Admit: 2011-09-12 | Discharge: 2011-09-12 | Disposition: A | Discharge status: Home / Self Care | Payer: BC Managed Care – PPO | Source: Ambulatory Visit | Attending: Neurosurgery | Admitting: Neurosurgery

## 2011-09-12 DIAGNOSIS — M545 Low back pain, unspecified: Secondary | ICD-10-CM

## 2011-09-26 ENCOUNTER — Other Ambulatory Visit: Payer: Self-pay | Admitting: Sports Medicine

## 2011-09-26 DIAGNOSIS — M25532 Pain in left wrist: Secondary | ICD-10-CM

## 2011-10-01 ENCOUNTER — Other Ambulatory Visit: Payer: Self-pay | Admitting: Sports Medicine

## 2011-10-01 DIAGNOSIS — T07XXXA Unspecified multiple injuries, initial encounter: Secondary | ICD-10-CM

## 2011-10-03 ENCOUNTER — Ambulatory Visit
Admission: RE | Admit: 2011-10-03 | Discharge: 2011-10-03 | Disposition: A | Discharge status: Home / Self Care | Payer: BC Managed Care – PPO | Source: Ambulatory Visit | Attending: Sports Medicine | Admitting: Sports Medicine

## 2011-10-03 DIAGNOSIS — M25532 Pain in left wrist: Secondary | ICD-10-CM

## 2011-10-03 DIAGNOSIS — T07XXXA Unspecified multiple injuries, initial encounter: Secondary | ICD-10-CM

## 2012-08-01 ENCOUNTER — Emergency Department (INDEPENDENT_AMBULATORY_CARE_PROVIDER_SITE_OTHER): Payer: BC Managed Care – PPO

## 2012-08-01 ENCOUNTER — Emergency Department
Admission: EM | Admit: 2012-08-01 | Discharge: 2012-08-01 | Disposition: A | Discharge status: Home / Self Care | Payer: BC Managed Care – PPO | Source: Home / Self Care | Attending: Family Medicine | Admitting: Family Medicine

## 2012-08-01 ENCOUNTER — Encounter: Payer: Self-pay | Admitting: *Deleted

## 2012-08-01 DIAGNOSIS — I517 Cardiomegaly: Secondary | ICD-10-CM

## 2012-08-01 DIAGNOSIS — J40 Bronchitis, not specified as acute or chronic: Secondary | ICD-10-CM

## 2012-08-01 DIAGNOSIS — T1590XA Foreign body on external eye, part unspecified, unspecified eye, initial encounter: Secondary | ICD-10-CM

## 2012-08-01 DIAGNOSIS — R05 Cough: Secondary | ICD-10-CM

## 2012-08-01 DIAGNOSIS — R059 Cough, unspecified: Secondary | ICD-10-CM

## 2012-08-01 DIAGNOSIS — R062 Wheezing: Secondary | ICD-10-CM

## 2012-08-01 MED ORDER — METHYLPREDNISOLONE SODIUM SUCC 125 MG IJ SOLR
125.0000 mg | Freq: Once | INTRAMUSCULAR | Status: AC
  Administered 2012-08-01: 125 mg via INTRAMUSCULAR

## 2012-08-01 MED ORDER — PREDNISONE 50 MG PO TABS: ORAL_TABLET | ORAL | Status: DC

## 2012-08-01 MED ORDER — AZITHROMYCIN 250 MG PO TABS: ORAL_TABLET | ORAL | Status: DC

## 2012-08-01 MED ORDER — POLYMYXIN B-TRIMETHOPRIM 10000-0.1 UNIT/ML-% OP SOLN: 1.0000 [drp] | OPHTHALMIC | Status: DC

## 2012-08-01 NOTE — ED Provider Notes (Signed)
History     CSN: [id number]  Arrival date & time 08/01/12  1340   First MD Initiated Contact with Patient 08/01/12 1344      Chief Complaint  Patient presents with  . Eye Pain   HPI Eye pain x 2 days. Pt with hx/o recurrent eye foreign bodies in the past. Feels like he has foreign body in L eye. Unsure of source. Has been working in the garage. No LOV. No headache. No fevers.   URI Symptoms Onset: 1 week  Description: cough, wheezing, mild SOB Modifying factors:  [facility]. Still with cough. Hx/o pneumonia. Pt wants to make sure that sxs are not from PNA.   Symptoms Nasal discharge: mild Fever: no Sore throat: no Cough: yes Wheezing: yes Ear pain: no GI symptoms: no Sick contacts: no  Red Flags  Stiff neck: no Dyspnea: yes Rash: no Swallowing difficulty: no  Sinusitis Risk Factors Headache/face pain: no Double sickening: no tooth pain: no  Allergy Risk Factors Sneezing: no Itchy scratchy throat: no Seasonal symptoms: no  Flu Risk Factors Headache: no muscle aches: no severe fatigue: no    History reviewed. No pertinent past medical history.  Past Surgical History  Procedure Date  . Carpal tunnel release   . Spine surgery     x 5  . Tonsillectomy     History reviewed. No pertinent family history.  History  Substance Use Topics  . Smoking status: Never Smoker   . Smokeless tobacco: Never Used  . Alcohol Use: Yes      Review of Systems  All other systems reviewed and are negative.    Allergies  Review of patient's allergies indicates no known allergies.  Home Medications  No current outpatient prescriptions on file.  BP 132/76  Pulse 74  Temp 98.6 F (37 C) (Oral)  Resp 16  Ht 6\' 1"  (1.854 m)  Wt 346 lb 4 oz (157.058 kg)  BMI 45.68 kg/m2  SpO2 94%  Physical Exam  Constitutional:       Obese, NAD    HENT:  Head: Normocephalic and atraumatic.  Right Ear: External ear normal.  Left  Ear: External ear normal.  Eyes:       Noted foreign body in R upper eyelid portion of R eye.  No corneal abrasions on florescein eye exam.    Neck:       Large neck girth    Cardiovascular: Normal rate, regular rhythm and normal heart sounds.   Pulmonary/Chest: Effort normal and breath sounds normal. He has no wheezes.  Abdominal: Soft.  Musculoskeletal: Normal range of motion.  Neurological: He is alert.  Skin: Skin is warm.    ED Course  Procedures (including critical care time)  Labs Reviewed - No data to display Dg Chest 2 View  08/01/2012  *RADIOLOGY REPORT*  Clinical Data: Cough.  CHEST - 2 VIEW  Comparison: No priors.  Findings: Lung volumes are low.  There are some areas of interstitial prominence in the lung bases bilaterally with some mild peribronchial cuffing.  No frank airspace consolidation.  No pleural effusions.  No evidence of pulmonary edema.  Azygos lobe (normal anatomical variant) is incidentally noted.  Heart size is mildly enlarged.  Mediastinal contours are unremarkable. Orthopedic fixation hardware in the lower cervical spine.  IMPRESSION: 1.  Interstitial prominence and peribronchial cuffing may suggest mild bronchitis. 2.  Mild cardiomegaly.   Original Report Authenticated By: Trudie Reed, M.D.  1. Eye foreign body   2. Bronchitis   3. Wheezing       MDM  Foreign body removed at bedside. Will place on polytrim for infectious coverage.  Noted bronchitis on CXR Will place on zpak for atypical coverage.  Solumedrol 125mg  IM x1 Prednisone x 5 days.  Discussed general care and infectious red flags.  Follow up as needed.      The patient and/or caregiver has been counseled thoroughly with regard to treatment plan and/or medications prescribed including dosage, schedule, interactions, rationale for use, and possible side effects and they verbalize understanding. Diagnoses and expected course of recovery discussed and will return if not improved  as expected or if the condition worsens. Patient and/or caregiver verbalized understanding.              Doree Albee, MD 08/01/12 1536

## 2012-08-01 NOTE — ED Notes (Signed)
Pt v/o object in right eye x 2 days.

## 2012-09-07 IMAGING — CR DG WRIST COMPLETE 3+V*L*
4 series · 4 of 4 positions shown · non-contrast
Comparison: Plain films left hand 04/04/2004.

CLINICAL DATA: Status post fall 05/10/2011.  Pain.

LEFT WRIST - COMPLETE 3+ VIEW

[x wrist pa left]
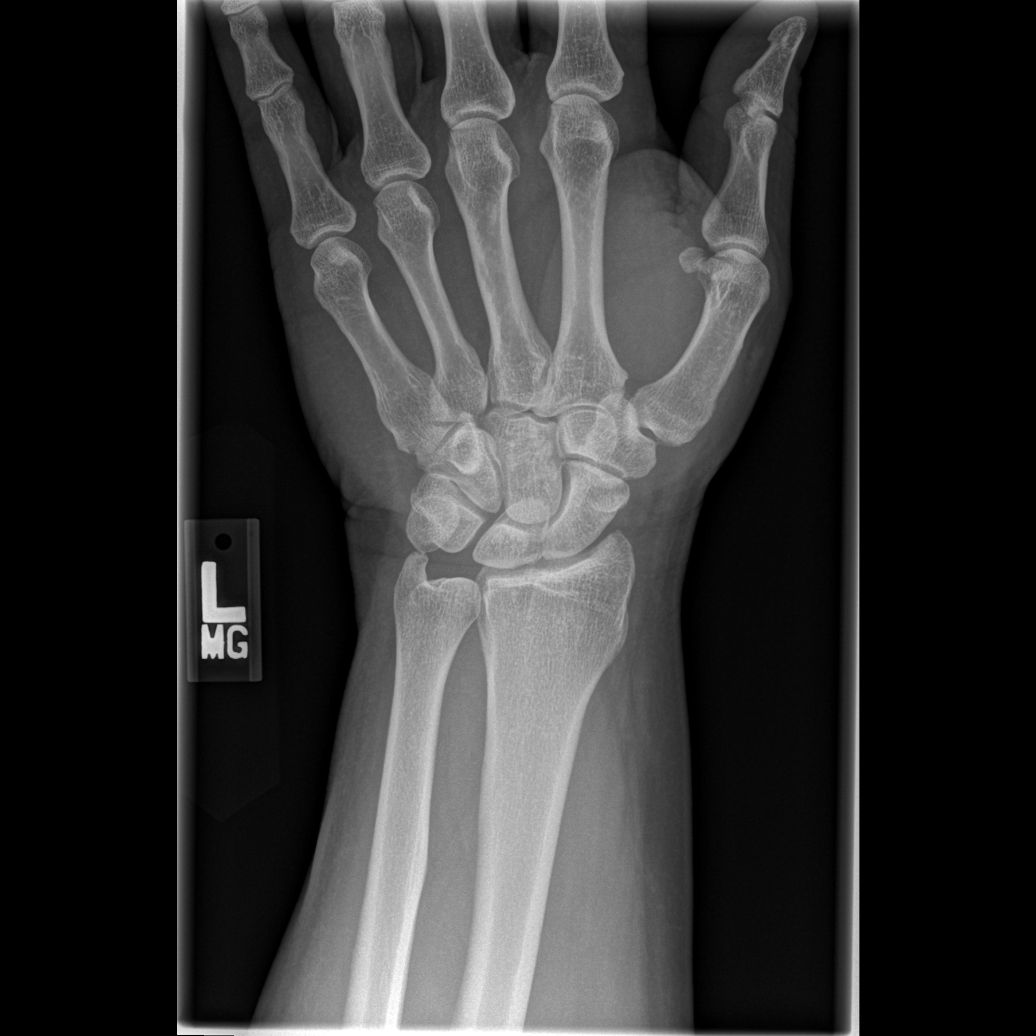

[x wrist obl left]
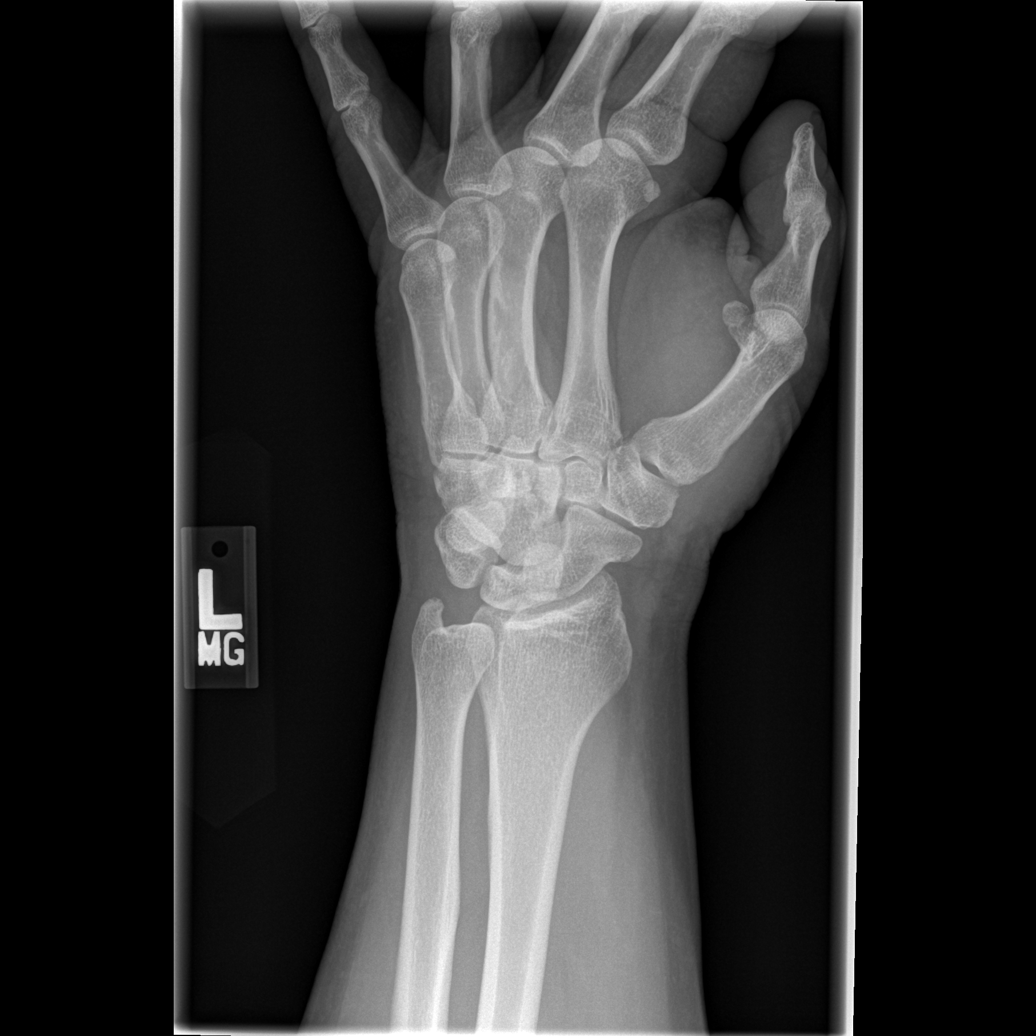

[x wrist lat left]
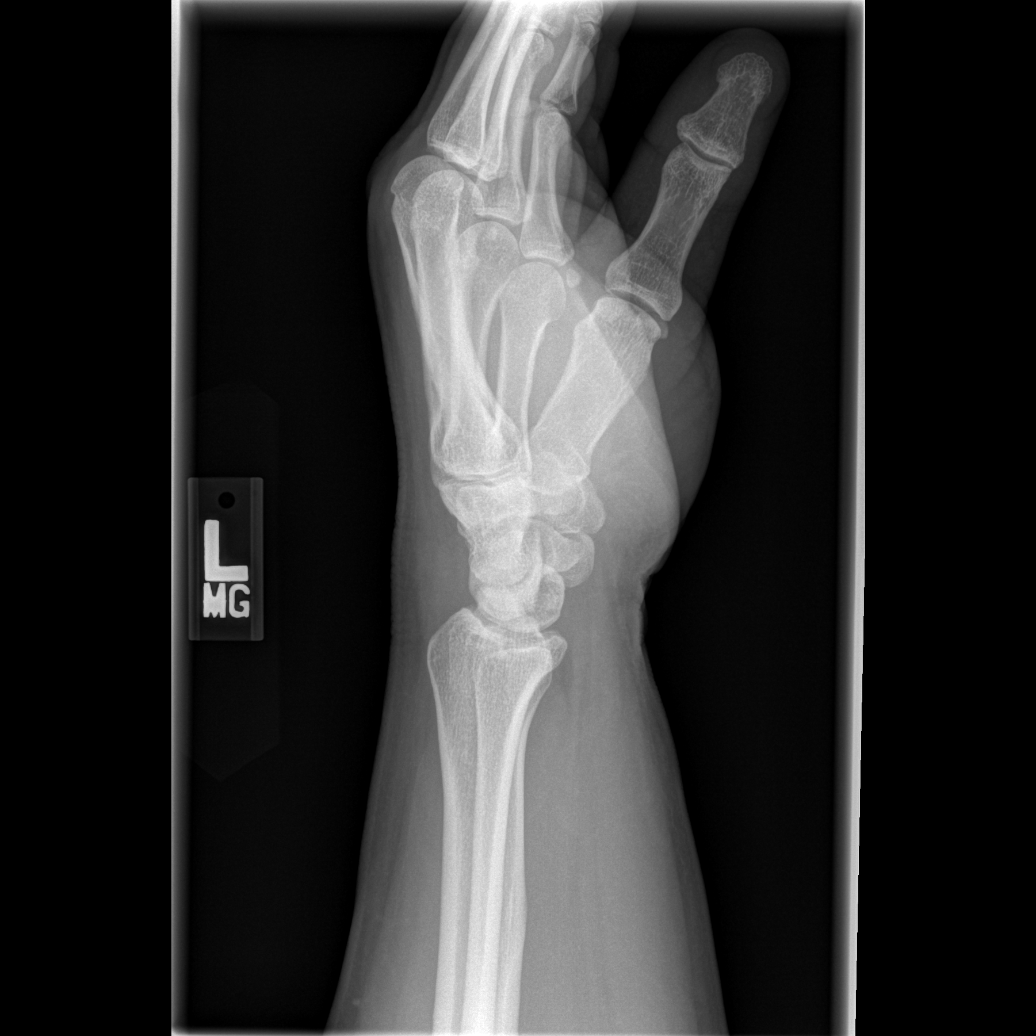

[x navicular]
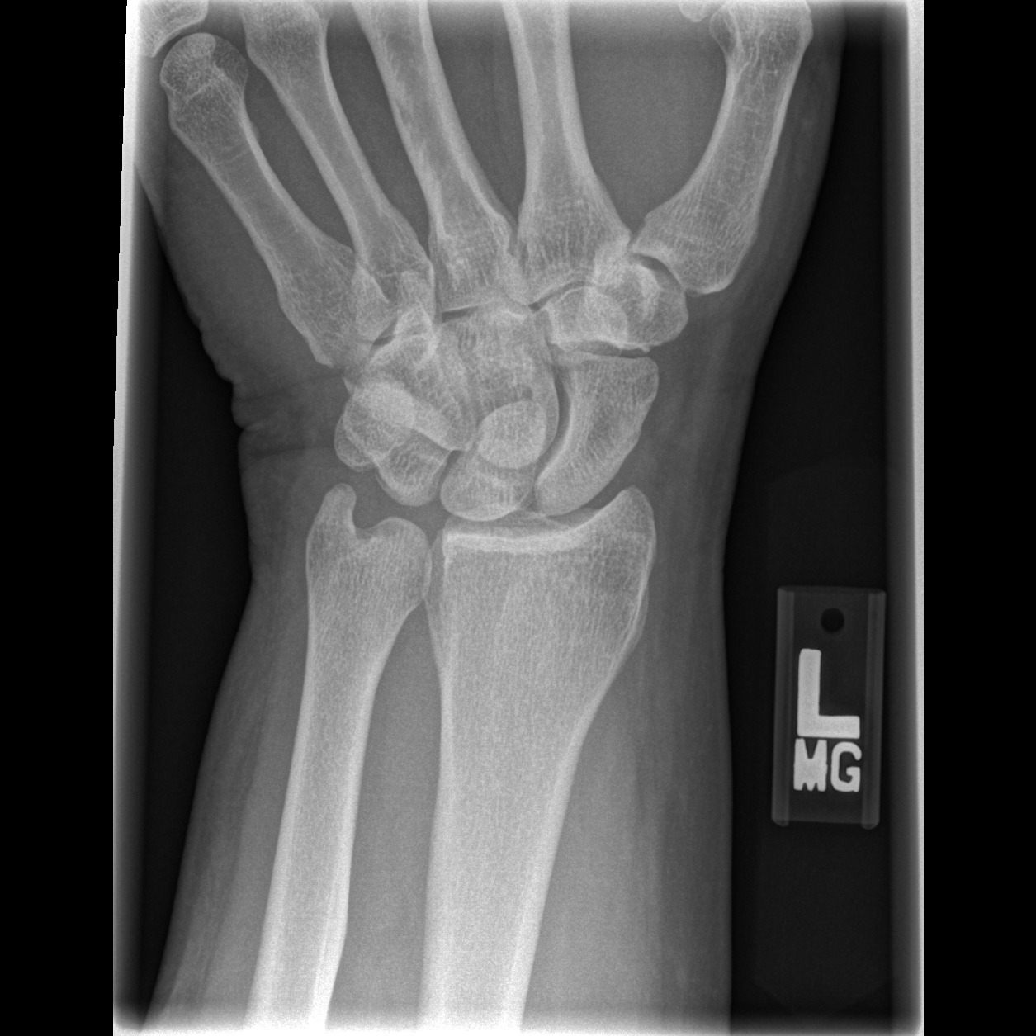

[4 of 4 positions shown; findings below may reference images not displayed]

FINDINGS: Imaged bones, joints and soft tissues appear normal.
IMPRESSION: Negative study.

## 2012-11-12 ENCOUNTER — Encounter: Payer: Self-pay | Admitting: Neurology

## 2012-11-12 NOTE — Progress Notes (Signed)
Quick Note:  I reviewed the patient's CPAP compliance data from 3/17 to 11/03/2012 which is a total of 30 days, during which time the patient used CPAP every day except for 1 day. The average usage was 4 hours and 31 minutes. The percent used days greater than 4 hours was 63 %, indicating fair compliance. The residual AHI was 1.3 per hour, indicating good treatment pressure of 11 cm of water pressure with EPR are of 3.   Huston Foley, MD, PhD [facility] ([facility]   ______

## 2012-11-20 ENCOUNTER — Other Ambulatory Visit: Payer: Self-pay | Admitting: Urology

## 2012-11-20 DIAGNOSIS — H538 Other visual disturbances: Secondary | ICD-10-CM

## 2012-11-23 ENCOUNTER — Other Ambulatory Visit: Payer: Self-pay | Admitting: Urology

## 2012-11-23 ENCOUNTER — Ambulatory Visit
Admission: RE | Admit: 2012-11-23 | Discharge: 2012-11-23 | Disposition: A | Discharge status: Home / Self Care | Payer: BC Managed Care – PPO | Source: Ambulatory Visit | Attending: Urology | Admitting: Urology

## 2012-11-23 DIAGNOSIS — Z139 Encounter for screening, unspecified: Secondary | ICD-10-CM

## 2012-11-25 ENCOUNTER — Ambulatory Visit
Admission: RE | Admit: 2012-11-25 | Discharge: 2012-11-25 | Disposition: A | Discharge status: Home / Self Care | Payer: BC Managed Care – PPO | Source: Ambulatory Visit | Attending: Urology | Admitting: Urology

## 2012-11-25 DIAGNOSIS — H538 Other visual disturbances: Secondary | ICD-10-CM

## 2013-01-14 ENCOUNTER — Encounter: Payer: Self-pay | Admitting: Neurology

## 2013-02-11 ENCOUNTER — Encounter: Payer: Self-pay | Admitting: Neurology

## 2013-02-11 ENCOUNTER — Ambulatory Visit (INDEPENDENT_AMBULATORY_CARE_PROVIDER_SITE_OTHER): Payer: BC Managed Care – PPO | Admitting: Neurology

## 2013-02-11 VITALS — BP 138/86 | HR 79 | Resp 18 | Ht 73.0 in | Wt 340.0 lb

## 2013-02-11 DIAGNOSIS — M549 Dorsalgia, unspecified: Secondary | ICD-10-CM

## 2013-02-11 DIAGNOSIS — G4733 Obstructive sleep apnea (adult) (pediatric): Secondary | ICD-10-CM

## 2013-02-11 DIAGNOSIS — G4730 Sleep apnea, unspecified: Secondary | ICD-10-CM

## 2013-02-11 DIAGNOSIS — G35 Multiple sclerosis: Secondary | ICD-10-CM | POA: Insufficient documentation

## 2013-02-11 HISTORY — DX: Sleep apnea, unspecified: G47.30

## 2013-02-11 HISTORY — DX: Multiple sclerosis: G35

## 2013-02-11 NOTE — Progress Notes (Signed)
[facility]  Provider:  Dr Vickey Huger Referring Provider: No ref. provider found Primary Care Physician:  Dr Mickie Kay   Chief Complaint  Patient presents with  . Follow-up    hypersomnia w/sleep apnea, rm 11    HPI:  Mark [name]. is a 52 y.o. male here as a referral from Dr.Cram for follow up on sleep apnea.   Mr. Despina Hidden has been a long time user of CPAP since early 2008. In December 2013 he returned upon request of his DME company-[employer] for a sleep consult. The patient had stated that sleepiness had increased but he also was on chronic pain-  not sleeping well at baseline and on the region an average of 4 hours of 90 sleep. His old CPAP machine setting will not known and the mammary jerk was not readable at the time of our encounter. The patient returned for a sleep study on 07/13/2012 and began tested positive for a high obstructive sleep apnea index his AHI was 96.1, his RDI 98.6. Oxygen saturation at nadir was 79% but only for 17.4 minutes. The patient's study was let's and CPAP initiated and titrated to 11 cm water he had no central apneas and response to the titration in his AHI was significantly reduced.  He used and nasal comfort gel mask.  EPR was set at 3 cm water. The patient also had a download from the CPAP in spring of this year showing borderline compliance.today I have another download available for the last month and his average daily used to time is 4 hours 4 minutes so he is just concerned compliant. His AHI is now 1.1, his median airleak is moderate 4.8 L a minute. He states that he still is not able to sleep longer by using CPAP at the CPAP has significantly reduced his snoring and apneic events his fatigue severity score today is 48 points his Epworth score is 6 pints -reduced in comparison to his last visit. His March download showed an AHI of 1.4 and June a residual  AHI of 1.2.  Is noted that if he doesn't wear his machine and mask  he awakens with a very sore throat from the loud , thunderous snoring.  His bedtime is 1 AM to 6 AM , will only get 4 hours average of nightly sleep- all his life , not a recent change. He used to work 2 jobs , and could go without any sleep in his twenties.  He has no cyclic sleep pattern, no depression history , and had a cervical fusion. He has frequent pneumonia , rhinitis,  and rarely a sinus infection.  He just received disability 2014 for chronic pain, and MS .   I also learned that [name] who has undergone multiple back surgeries in the past and has a history of chronic back pain,and was  diagnosed with a neurogenic bladder, developed dysesthesias earlier this year and was diagnosed by MRI with multiple sclerosis. He is treated by Dr. Mickie Kay , who has  started him on Aubagio .             Review of Systems: Out of a complete 14 system review, the patient complains of only the following symptoms, and all other reviewed systems are negative.  4 hours of sleep, Epworth 6, FSS 48.  becks 14 from 25 points.   History   Social History  . Marital Status: Married    Spouse Name: N/A    Number  of Children: 2  . Years of Education: 12   Occupational History  . Not on file.   Social History Main Topics  . Smoking status: Never Smoker   . Smokeless tobacco: Never Used  . Alcohol Use: Yes  . Drug Use: No  . Sexually Active: Not on file   Other Topics Concern  . Not on file   Social History Narrative   Last download from 2011 showed good response to 12 cm water with 3 cm EPR, but we have no record of the baseline AI.    Need to order  the old paper copies and send patient for retitration  to lab, after five years plus this will be a SPLIT at 15 and at 3% score. This calendar year preferred for deductible costs.           patient received machine in Dec 2013 , avoiding a new deductible.  Family History  Problem Relation Age of Onset  . Sleep walking Son   .  Cancer Other   . Cancer Other     Past Medical History  Diagnosis Date  . Morbid obesity   . Dysphonia     thoracic left sided"impingement" with heminumbness  . Chronic pain   . Depression   . Anxiety     chronic pain treated with narcotics  . MS (multiple sclerosis) 02/11/2013    ABAGIO  Per. Dr Daphane Shepherd  . MS (multiple sclerosis)   . Sleep apnea with use of continuous positive airway pressure (CPAP) 02/11/2013    07-13-12 AHi of 98.6 /hr titrated to 11 cm water ,  average user time 4 hours and 4 minutes. Residual AHI 1.20 December 2012 .    Past Surgical History  Procedure Laterality Date  . Carpal tunnel release    . Spine surgery      total of six vertebras fused, three lumbar surgeries and one anterior neck fusion  . Tonsillectomy    . Lower back fusion      surgery    Current Outpatient Prescriptions  Medication Sig Dispense Refill  . DULoxetine (CYMBALTA) 60 MG capsule       . HYDROcodone-acetaminophen (NORCO) 10-325 MG per tablet       . LYRICA 75 MG capsule       . Teriflunomide (AUBAGIO) 14 MG TABS Take 14 mg by mouth 1 day or 1 dose. Dr Mickie Kay      . azithromycin (ZITHROMAX) 250 MG tablet Take 2 tabs PO x 1 dose, then 1 tab PO QD x 4 days  6 tablet  0  . predniSONE (DELTASONE) 50 MG tablet 1 tab daily x 5 days  5 tablet  0  . trimethoprim-polymyxin b (POLYTRIM) ophthalmic solution Place 1 drop into the right eye every 4 (four) hours.  10 mL  0   No current facility-administered medications for this visit.    Allergies as of 02/11/2013  . (No Known Allergies)    Vitals: BP 138/86  Pulse 79  Ht 6\' 1"  (1.854 m)  Wt 340 lb (154.223 kg)  BMI 44.87 kg/m2 Last Weight:  Wt Readings from Last 1 Encounters:  02/11/13 340 lb (154.223 kg)   Last Height:   Ht Readings from Last 1 Encounters:  02/11/13 6\' 1"  (1.854 m)   Vision Screening:  20/20 bilat .  Physical exam:  General: The patient is awake, alert and appears not in acute distress. The patient is well  groomed. Head: Normocephalic, atraumatic. Neck is  supple. Mallampati 3, neck circumference:18 inches  Cardiovascular:  Regular rate and rhythm, without  murmurs or carotid bruit, and without distended neck veins. Respiratory: Lungs are clear to auscultation. Skin:  Without evidence of edema, or rash Trunk: BMI is still elevated and patient  has normal posture. Lost 8 pounds  Neurologic exam : The patient is awake and alert, oriented to place and time.  Memory subjective described as intact. There is a normal attention span & concentration ability.  Speech is fluent without  dysarthria, dysphonia or aphasia. Mood and affect are appropriate, he appears not depressed. .  Cranial nerves: Pupils are equal and briskly reactive to light. Funduscopic exam without evidence of pallor or edema. Extraocular movements  in vertical and horizontal planes intact and without nystagmus. Visual fields by finger perimetry are intact. Hearing to finger rub intact.  Facial sensation intact to fine touch. Facial motor strength is symmetric and tongue and uvula move midline.  Motor exam:  Normal tone and normal muscle bulk and symmetric normal strength in all extremities. The patient used a cane due to leg weakness and back pain, which he has not used today- he feels the Aubagio helped it. -   Sensory:  Fine touch, pinprick and vibration were tested in all extremities.  Feet numb  To fine touch,  vibration- small fiber neuropathy ? Proprioception is tested in the upper extremities  normal.  Coordination: Rapid alternating movements in the fingers/hands is tested and normal. Finger-to-nose maneuver tested  without evidence of ataxia, dysmetria or tremor.  Gait and station: Patient walks without assistive device , unassisted stool climb up to the exam table. He avoids stair due to knee pain .  Stance is stable and normal. Tandem gait is fragmented. Romberg testing is positive , he drifts forward - he fell i last month  and broke his wrist .   Deep tendon reflexes: in the  upper and lower extremities are attenuated , right more than left. Babinski equivocal.   Assessment:  After physical and neurologic examination, review of laboratory studies, imaging, neurophysiology testing and pre-existing records, assessment will be reviewed on the problem list.  OSA well treated with 11 cm CPAP,  AHi 1.2 -  Just making the compliance criteria at 4 hours and 4 minutes.  [facility].  340 pounds,  Reports he is not diabetic , but her needs to lose weight benefiting  his back, his OSA and his gait.   Plan:  Treatment plan and additional workup will be reviewed under Problem List.  Diet - try 2 and 5 diet , Dr. Leland Johns . CPAP continue on current setting.  MS followed by Dr. Daphane Shepherd

## 2013-02-11 NOTE — Patient Instructions (Signed)

## 2013-03-01 ENCOUNTER — Emergency Department
Admission: EM | Admit: 2013-03-01 | Discharge: 2013-03-01 | Disposition: A | Discharge status: Home / Self Care | Payer: BC Managed Care – PPO | Source: Home / Self Care | Attending: Family Medicine | Admitting: Family Medicine

## 2013-03-01 ENCOUNTER — Encounter: Payer: Self-pay | Admitting: Emergency Medicine

## 2013-03-01 DIAGNOSIS — J069 Acute upper respiratory infection, unspecified: Secondary | ICD-10-CM

## 2013-03-01 MED ORDER — AZITHROMYCIN 250 MG PO TABS: ORAL_TABLET | ORAL | Status: DC

## 2013-03-01 MED ORDER — BENZONATATE 100 MG PO CAPS: ORAL_CAPSULE | ORAL | Status: DC

## 2013-03-01 NOTE — ED Provider Notes (Signed)
CSN: [id number]     Arrival date & time 03/01/13  1928 History     First MD Initiated Contact with Patient 03/01/13 1952     Chief Complaint  Patient presents with  . Nasal Congestion  . Cough  . Fatigue      HPI Comments: Patient reports onset of a mild sore throat, fatigue, and sinus congestion 4 days ago.  This was followed by a cough, now worse at night.  He has had chills but no fever.   He has a history of pneumonia  The history is provided by the patient.    Past Medical History  Diagnosis Date  . Morbid obesity   . Dysphonia     thoracic left sided"impingement" with heminumbness  . Chronic pain   . Depression   . Anxiety     chronic pain treated with narcotics  . MS (multiple sclerosis) 02/11/2013    ABAGIO  Per. Dr Daphane Shepherd  . MS (multiple sclerosis)   . Sleep apnea with use of continuous positive airway pressure (CPAP) 02/11/2013    07-13-12 AHi of 98.6 /hr titrated to 11 cm water ,  average user time 4 hours and 4 minutes. Residual AHI 1.20 December 2012 .   Past Surgical History  Procedure Laterality Date  . Carpal tunnel release    . Spine surgery      total of six vertebras fused, three lumbar surgeries and one anterior neck fusion  . Tonsillectomy    . Lower back fusion      surgery   Family History  Problem Relation Age of Onset  . Sleep walking Son   . Cancer Other   . Cancer Other    History  Substance Use Topics  . Smoking status: Never Smoker   . Smokeless tobacco: Never Used  . Alcohol Use: Yes    Review of Systems + sore throat + cough No pleuritic pain No wheezing + nasal congestion + post-nasal drainage + sinus pain/pressure No itchy/red eyes No earache No hemoptysis No SOB No fever, + chills No nausea No vomiting No abdominal pain No diarrhea No urinary symptoms No skin rashes + fatigue No myalgias + headache Used OTC meds without relief  Allergies  Review of patient's allergies indicates no known allergies.  Home  Medications   Current Outpatient Rx  Name  Route  Sig  Dispense  Refill  . azithromycin (ZITHROMAX) 250 MG tablet      Take 2 tabs PO x 1 dose, then 1 tab PO QD x 4 days   6 tablet   0   . benzonatate (TESSALON) 100 MG capsule      Take one cap at bedtime as necessary for cough   12 capsule   0   . DULoxetine (CYMBALTA) 60 MG capsule               . HYDROcodone-acetaminophen (NORCO) 10-325 MG per tablet               . LYRICA 75 MG capsule               . Teriflunomide (AUBAGIO) 14 MG TABS   Oral   Take 14 mg by mouth 1 day or 1 dose. Dr Onalee Hua meyers          BP 127/80  Pulse 70  Temp(Src) 98 F (36.7 C) (Oral)  Resp 16  Ht 6' (1.829 m)  Wt 346 lb (156.945 kg)  BMI 46.92 kg/m2  SpO2 97% Physical Exam Nursing notes and Vital Signs reviewed. Appearance:  Patient appears stated age, and in no acute distress.  Patient is obese (BMI 46.9) Eyes:  Pupils are equal, round, and reactive to light and accomodation.  Extraocular movement is intact.  Conjunctivae are not inflamed  Ears:  Canals normal.  Tympanic membranes normal.  Nose:  Mildly congested turbinates.  No sinus tenderness.  Pharynx:  Normal Neck:  Supple.  Tender shotty left posterior nodes  Lungs:  Clear to auscultation.  Breath sounds are equal.  Heart:  Regular rate and rhythm without murmurs, rubs, or gallops.  Abdomen:  Nontender without masses or hepatosplenomegaly.  Bowel sounds are present.  No CVA or flank tenderness.  Extremities:  No edema.  No calf tenderness Skin:  No rash present.   ED Course   Procedures  none    1. Acute upper respiratory infections of unspecified site; suspect viral URI     MDM  With a history of pneumonia will begin a z-pack for atypical coverage. Prescription written for Benzonatate [facility]) to take at bedtime for night-time cough.  Take Mucinex D (guaifenesin with decongestant) twice daily for congestion.  Increase fluid intake, rest. May use Afrin  nasal spray (or generic oxymetazoline) twice daily for about 5 days.  Also recommend using saline nasal spray several times daily and saline nasal irrigation (AYR is a common brand) Stop all antihistamines for now, and other non-prescription cough/cold preparations. Recommend pneumococcal vaccine when well Followup with Family Doctor if not improved in one week.   Lattie Haw, MD 03/03/13 [phone]

## 2013-03-01 NOTE — ED Notes (Signed)
Reports congestion and cough x 5 days.

## 2013-08-11 ENCOUNTER — Encounter: Payer: Self-pay | Admitting: Neurology

## 2013-08-13 ENCOUNTER — Encounter: Payer: Self-pay | Admitting: Neurology

## 2013-09-15 DIAGNOSIS — M47816 Spondylosis without myelopathy or radiculopathy, lumbar region: Secondary | ICD-10-CM | POA: Insufficient documentation

## 2013-09-15 DIAGNOSIS — M47812 Spondylosis without myelopathy or radiculopathy, cervical region: Secondary | ICD-10-CM | POA: Insufficient documentation

## 2013-09-15 DIAGNOSIS — E669 Obesity, unspecified: Secondary | ICD-10-CM | POA: Insufficient documentation

## 2013-09-15 DIAGNOSIS — D214 Benign neoplasm of connective and other soft tissue of abdomen: Secondary | ICD-10-CM | POA: Insufficient documentation

## 2013-10-25 DIAGNOSIS — G629 Polyneuropathy, unspecified: Secondary | ICD-10-CM | POA: Insufficient documentation

## 2013-10-25 DIAGNOSIS — G4733 Obstructive sleep apnea (adult) (pediatric): Secondary | ICD-10-CM | POA: Insufficient documentation

## 2013-10-25 DIAGNOSIS — G8381 Brown-Sequard syndrome: Secondary | ICD-10-CM | POA: Insufficient documentation

## 2014-02-16 ENCOUNTER — Telehealth: Payer: Self-pay | Admitting: *Deleted

## 2014-02-16 ENCOUNTER — Encounter: Payer: Self-pay | Admitting: Neurology

## 2014-02-16 ENCOUNTER — Ambulatory Visit (INDEPENDENT_AMBULATORY_CARE_PROVIDER_SITE_OTHER): Payer: Medicare Other | Admitting: Neurology

## 2014-02-16 DIAGNOSIS — G4733 Obstructive sleep apnea (adult) (pediatric): Secondary | ICD-10-CM

## 2014-02-16 DIAGNOSIS — Z9989 Dependence on other enabling machines and devices: Secondary | ICD-10-CM

## 2014-02-16 NOTE — Progress Notes (Addendum)
[facility]  Provider:  Dr Brett Fairy Referring Provider: No ref. provider found Primary Care Physician:  Dr Rae Mar   Chief Complaint  Patient presents with  . Follow-up    Room 10    HPI:  Mark [name]. is a 53 y.o. male here as a referral from Dr.Cram for follow up on sleep apnea.      Mark [name] has been able to reduce his BMI and reduce thus his degree of OSA. He lost 24 pounds.    His CPAP download today was not possible , he forgot the machine or memory chip. He is followed by [facility] .  He has MS and is on Aubagio, noted delayed wound healing.  He is fatigued , he is disabled. He has irregular sleep habits , based on his hobby. He goes to bed at 2 AM and  Goes to sleep promptly , he rises at 8 AM but wears his CPAP nightly.   He drinks about a gallon of caffeinated sweet  tea  a day, but no coffee and also rarely soda.  Nonsmoker and nondrinker. The patient for a score prior to his last sleep study from 07-13-12 was 15 points and is becks inventory 25 points the patient now endorsed the Epworth sleepiness score at 7 points on CPAP and depression score at 0  points.    Last visit note at [facility], [facility] :  Mark [name] has been a long time user of CPAP since early 2008. In December 2013 he returned upon request of his DME company[employer] for a sleep consult. The patient had stated that sleepiness had increased but he also was on chronic pain-  not sleeping well at baseline and getting  an average of 4 hours sleep. His old CPAP machine setting was not known and the memory card  was not readable at the time of our encounter.  The patient returned for a sleep study on 07/13/2012 and tested positive for a severe  obstructive sleep apnea,   his AHI was 96.1, his RDI 98.6. Oxygen saturation at nadir was 79% but only for 17.4 minutes.  The patient's study was SPLIT and  CPAP initiated and titrated to 11 cm water.  He had no central apneas and  response to the titration in his AHI was significantly reduced.  He used and nasal comfort gel mask.  EPR was set at 3 cm water. The patient also had a download from the CPAP in spring of this year ( 2014)  showing borderline compliance. I have another download available for the last month and his average daily used to time is 4 hours 4 minutes so he is just concerned compliant. His AHI is now 1.1, his median airleak is moderate 4.8 L a minute. He states that he still is not able to sleep longer by using CPAP at the CPAP has significantly reduced his snoring and apneic events his fatigue severity score today is 48 points , his Epworth score is 6 points  -reduced in comparison to his last visit. His March  14 download showed an AHI of 1.4 and June a residual  AHI of 1.2.   Is noted that if he doesn't wear his machine and mask he awakens with a very sore throat from the loud , thunderous snoring.  His bedtime is 1 AM to 6 AM , will only get 4 hours average of nightly sleep- all his life , not a recent change. He used to  work 2 jobs , and could go without any sleep in his twenties.  He has no cyclic sleep pattern, no depression history , and had a cervical fusion. He has frequent pneumonia , rhinitis,  and rarely a sinus infection.  He just received disability 2014 for chronic pain, and MS .   I also learned that Mark [name] who has undergone multiple back surgeries in the past and has a history of chronic back pain,and was  diagnosed with a neurogenic bladder, developed dysesthesias earlier this year and was diagnosed by MRI with multiple sclerosis. He is treated by Dr. Rae Mar , who has  started him on Aubagio  Out of a complete 14 system review, the patient complains of only the following symptoms, and all other reviewed systems are negative.  4 hours of sleep, Epworth 7, FSS 48.  becks 14 from 25 points.   GDS zero points   History   Social History  . Marital Status: Married    Spouse  Name: Mark [name]    Number of Children: 2  . Years of Education: 12+   Occupational History  . Not on file.   Social History Main Topics  . Smoking status: Never Smoker   . Smokeless tobacco: Never Used  . Alcohol Use: Yes  . Drug Use: No  . Sexual Activity: Not on file   Other Topics Concern  . Not on file   Social History Narrative   Last download from 2011 showed good response to 12 cm water with 3 cm EPR, but we have no record of the baseline AI.    Need to order  the old paper copies and send patient for retitration  to lab, after five years plus this will be a SPLIT at 15 and at 3% score. This calendar year preferred for deductible costs.    Patient is married Mark [name]) and lives at home with his wife, daughter, son-in-law and grandchild.   Patient has two adult children.   Patient is right-handed.   Patient is disabled.   Patient has a college education.   Patient drinks a gallon of tea daily.          patient received machine in Dec 2013 , avoiding a new deductible.  Family History  Problem Relation Age of Onset  . Sleep walking Son   . Cancer Other   . Cancer Other     Past Medical History  Diagnosis Date  . Morbid obesity   . Dysphonia     thoracic left sided"impingement" with heminumbness  . Chronic pain   . Depression   . Anxiety     chronic pain treated with narcotics  . MS (multiple sclerosis) 02/11/2013    [name]  . MS (multiple sclerosis)   . Sleep apnea with use of continuous positive airway pressure (CPAP) 02/11/2013    07-13-12 AHi of 98.6 /hr titrated to 11 cm water ,  average user time 4 hours and 4 minutes. Residual AHI 1.20 December 2012 .    Past Surgical History  Procedure Laterality Date  . Carpal tunnel release    . Spine surgery      total of six vertebras fused, three lumbar surgeries and one anterior neck fusion  . Tonsillectomy    . Lower back fusion      surgery    Current Outpatient Prescriptions  Medication Sig  Dispense Refill  . amphetamine-dextroamphetamine (ADDERALL) 10 MG tablet Take 10 mg by mouth 2 (  two) times daily with a meal.      . DULoxetine (CYMBALTA) 60 MG capsule       . gabapentin (NEURONTIN) 300 MG capsule Take 300 mg by mouth 4 (four) times daily.      Marland Kitchen HYDROcodone-acetaminophen (NORCO) 10-325 MG per tablet       . ibuprofen (ADVIL,MOTRIN) 200 MG tablet Take 800 mg by mouth. As needed      . Teriflunomide (AUBAGIO) 14 MG TABS Take 14 mg by mouth 1 day or 1 dose. Dr Rae Mar       No current facility-administered medications for this visit.    Allergies as of 02/16/2014  . (No Known Allergies)    Vitals: BP 139/84  Pulse 79  Resp 16  Wt 339 lb (153.769 kg) Last Weight:  Wt Readings from Last 1 Encounters:  02/16/14 339 lb (153.769 kg)   Last Height:   Ht Readings from Last 1 Encounters:  03/01/13 6' (1.829 m)   Vision Screening:  20/20 bilat .  Physical exam:  General: The patient is awake, alert and appears not in acute distress. The patient is well groomed. Head: Normocephalic, atraumatic. Neck is supple. Mallampati 3, neck circumference: 20 inches , large neck ,laterally narrowed airway, no TMJ no retrognathia. Normal nasal air movements.  Cardiovascular:  Regular rate and rhythm, without murmurs or carotid bruit. Respiratory: Lungs are clear to auscultation. Skin:  Without evidence of edema, or rash Trunk: BMI is still elevated and patient  has normal posture. Lost 8 pounds  Neurologic exam : The patient is awake and alert, oriented to place and time.   Memory subjective described as intact. There is a normal attention span & concentration ability.  Speech is fluent without  dysarthria, dysphonia or aphasia.  Mood and affect are appropriate, he appears not depressed. .  Cranial nerves: Pupils are equal and briskly reactive to light. Funduscopic exam without evidence of pallor or edema. Extraocular movements  in vertical and horizontal planes intact and  without nystagmus. Visual fields by finger perimetry are intact. Hearing to finger rub intact.  Facial sensation intact to fine touch. Facial motor strength is symmetric and tongue and uvula move midline.  Motor exam:  Normal tone and normal muscle bulk and symmetric normal strength in all extremities.  The patient used a cane due to leg weakness and back pain, which he has not used today- he feels the Aubagio helped it. -   Sensory:  Fine touch, pinprick and vibration were tested in all extremities.   Feet numb to fine touch,  vibration- small fiber neuropathy ? Proprioception is tested in the upper extremities  normal.  Coordination: Rapid alternating movements in the fingers/hands is tested and normal.  Finger-to-nose maneuver tested  without evidence of ataxia, dysmetria or tremor.  Gait and station: Patient walks without assistive device , unassisted stool climb up to the exam table. He avoids stair due to knee pain .  Stance is stable and normal. Tandem gait is fragmented. Romberg testing is positive , he drifts forward - he fell i last month and broke his wrist .   Deep tendon reflexes: in the  upper and lower extremities are attenuated, right more than left.  Babinski equivocal.   Assessment:  After physical and neurologic examination, review of laboratory studies, imaging, neurophysiology testing and pre-existing records, assessment will be reviewed on the problem list.  OSA well treated with 11 cm CPAP,  Awaiting download form DME -[facility].  Plan:  Treatment plan and additional workup will be reviewed under Problem List.   CPAP continue on current setting.  MS followed by Dr. Bjorn Loser , [facility] .

## 2014-02-16 NOTE — Telephone Encounter (Signed)
I added to flomax to his med list.

## 2014-05-11 ENCOUNTER — Other Ambulatory Visit: Payer: Self-pay | Admitting: Urology

## 2014-05-11 DIAGNOSIS — G35 Multiple sclerosis: Secondary | ICD-10-CM

## 2014-05-11 DIAGNOSIS — R200 Anesthesia of skin: Secondary | ICD-10-CM

## 2014-05-23 ENCOUNTER — Other Ambulatory Visit: Payer: BC Managed Care – PPO

## 2014-05-26 ENCOUNTER — Other Ambulatory Visit: Payer: BC Managed Care – PPO

## 2014-05-31 ENCOUNTER — Ambulatory Visit
Admission: RE | Admit: 2014-05-31 | Discharge: 2014-05-31 | Disposition: A | Discharge status: Home / Self Care | Payer: Medicare Other | Source: Ambulatory Visit | Attending: Urology | Admitting: Urology

## 2014-05-31 DIAGNOSIS — G35 Multiple sclerosis: Secondary | ICD-10-CM

## 2014-05-31 DIAGNOSIS — R200 Anesthesia of skin: Secondary | ICD-10-CM

## 2014-05-31 MED ORDER — GADOBENATE DIMEGLUMINE 529 MG/ML IV SOLN
20.0000 mL | Freq: Once | INTRAVENOUS | Status: AC | PRN
  Administered 2014-05-31: 20 mL via INTRAVENOUS

## 2014-06-03 ENCOUNTER — Ambulatory Visit
Admission: RE | Admit: 2014-06-03 | Discharge: 2014-06-03 | Disposition: A | Discharge status: Home / Self Care | Payer: Medicare Other | Source: Ambulatory Visit | Attending: Urology | Admitting: Urology

## 2014-06-03 DIAGNOSIS — G35 Multiple sclerosis: Secondary | ICD-10-CM

## 2014-06-03 DIAGNOSIS — R200 Anesthesia of skin: Secondary | ICD-10-CM

## 2014-06-03 MED ORDER — GADOBENATE DIMEGLUMINE 529 MG/ML IV SOLN: 20.0000 mL | Freq: Once | INTRAVENOUS | Status: AC | PRN

## 2014-08-01 ENCOUNTER — Other Ambulatory Visit: Payer: Self-pay | Admitting: Neurosurgery

## 2014-08-01 DIAGNOSIS — M5412 Radiculopathy, cervical region: Secondary | ICD-10-CM

## 2014-08-01 DIAGNOSIS — M4316 Spondylolisthesis, lumbar region: Secondary | ICD-10-CM

## 2014-08-12 ENCOUNTER — Other Ambulatory Visit: Payer: Self-pay

## 2014-08-22 ENCOUNTER — Encounter: Payer: Self-pay | Admitting: Adult Health

## 2014-08-22 ENCOUNTER — Encounter: Payer: Self-pay | Admitting: Neurology

## 2014-08-22 ENCOUNTER — Ambulatory Visit (INDEPENDENT_AMBULATORY_CARE_PROVIDER_SITE_OTHER): Payer: Medicare Other | Admitting: Adult Health

## 2014-08-22 VITALS — BP 139/89 | HR 72 | Ht 73.0 in | Wt 336.0 lb

## 2014-08-22 DIAGNOSIS — G35 Multiple sclerosis: Secondary | ICD-10-CM | POA: Diagnosis not present

## 2014-08-22 DIAGNOSIS — G4730 Sleep apnea, unspecified: Secondary | ICD-10-CM

## 2014-08-22 DIAGNOSIS — G4733 Obstructive sleep apnea (adult) (pediatric): Secondary | ICD-10-CM | POA: Diagnosis not present

## 2014-08-22 NOTE — Patient Instructions (Signed)
Try to use the CPAP for > 4 hours each night.   Sleep Apnea  Sleep apnea is a sleep disorder characterized by abnormal pauses in breathing while you sleep. When your breathing pauses, the level of oxygen in your blood decreases. This causes you to move out of deep sleep and into light sleep. As a result, your quality of sleep is poor, and the system that carries your blood throughout your body (cardiovascular system) experiences stress. If sleep apnea remains untreated, the following conditions can develop:  High blood pressure (hypertension).  Coronary artery disease.  Inability to achieve or maintain an erection (impotence).  Impairment of your thought process (cognitive dysfunction). There are three types of sleep apnea: 1. Obstructive sleep apnea--Pauses in breathing during sleep because of a blocked airway. 2. Central sleep apnea--Pauses in breathing during sleep because the area of the brain that controls your breathing does not send the correct signals to the muscles that control breathing. 3. Mixed sleep apnea--A combination of both obstructive and central sleep apnea. RISK FACTORS The following risk factors can increase your risk of developing sleep apnea:  Being overweight.  Smoking.  Having narrow passages in your nose and throat.  Being of older age.  Being male.  Alcohol use.  Sedative and tranquilizer use.  Ethnicity. Among individuals younger than 35 years, African Americans are at increased risk of sleep apnea. SYMPTOMS   Difficulty staying asleep.  Daytime sleepiness and fatigue.  Loss of energy.  Irritability.  Loud, heavy snoring.  Morning headaches.  Trouble concentrating.  Forgetfulness.  Decreased interest in sex. DIAGNOSIS  In order to diagnose sleep apnea, your caregiver will perform a physical examination. Your caregiver may suggest that you take a home sleep test. Your caregiver may also recommend that you spend the night in a sleep lab.  In the sleep lab, several monitors record information about your heart, lungs, and brain while you sleep. Your leg and arm movements and blood oxygen level are also recorded. TREATMENT The following actions may help to resolve mild sleep apnea:  Sleeping on your side.   Using a decongestant if you have nasal congestion.   Avoiding the use of depressants, including alcohol, sedatives, and narcotics.   Losing weight and modifying your diet if you are overweight. There also are devices and treatments to help open your airway:  Oral appliances. These are custom-made mouthpieces that shift your lower jaw forward and slightly open your bite. This opens your airway.  Devices that create positive airway pressure. This positive pressure "splints" your airway open to help you breathe better during sleep. The following devices create positive airway pressure:  Continuous positive airway pressure (CPAP) device. The CPAP device creates a continuous level of air pressure with an air pump. The air is delivered to your airway through a mask while you sleep. This continuous pressure keeps your airway open.  Nasal expiratory positive airway pressure (EPAP) device. The EPAP device creates positive air pressure as you exhale. The device consists of single-use valves, which are inserted into each nostril and held in place by adhesive. The valves create very little resistance when you inhale but create much more resistance when you exhale. That increased resistance creates the positive airway pressure. This positive pressure while you exhale keeps your airway open, making it easier to breath when you inhale again.  Bilevel positive airway pressure (BPAP) device. The BPAP device is used mainly in patients with central sleep apnea. This device is similar to the CPAP  device because it also uses an air pump to deliver continuous air pressure through a mask. However, with the BPAP machine, the pressure is set at two  different levels. The pressure when you exhale is lower than the pressure when you inhale.  Surgery. Typically, surgery is only done if you cannot comply with less invasive treatments or if the less invasive treatments do not improve your condition. Surgery involves removing excess tissue in your airway to create a wider passage way. Document Released: 06/28/2002 Document Revised: 11/02/2012 Document Reviewed: 11/14/2011 [facility] Patient Information 2015 [place], [place]. This information is not intended to replace advice given to you by your health care provider. Make sure you discuss any questions you have with your health care provider.

## 2014-08-22 NOTE — Progress Notes (Signed)
PATIENT: Mark [name]. DOB: 04/28/1961  REASON FOR VISIT: follow up HISTORY FROM: patient  HISTORY OF PRESENT ILLNESS: [name] is a 54 year old male with history of obstructive sleep Apnea. He returns today for a 90 day compliance download. He brought his machine with him today and his reports shows an AHI of 1.1 at 11 cm of water, uses his machine for 4 hours and 46 minutes a night, with 93 %compliance. However he only uses his machine for > 4 hours 19/30 days- 63 %. Leak is 28.9L/min in the 95th percentile. His Epworth score is 4 points was previously 7 points. His fatigue severity score is 61 was previously 48. Patient states that his fatigue is due to MS and some disc issues in his back. Dr. Tressa Busman in [place] follows him for his MS. Patient reports that he gets about 10 hours of sleep a night. He goes to bed around 11-12 PM and arises at 10-11 AM.  He denies having trouble falling asleep or staying a sleep. States that the gets up about 1-2 times a night to urinate. Overall patient feels that CPAP has improved his sleepiness. Since the last visit the patient has had no new medical issues.   HISTORY 02/16/14 [facility]): [name] has been able to reduce his BMI and reduce thus his degree of OSA. He lost 24 pounds.  His CPAP download today was not possible , he forgot the machine or memory chip. He is followed by [facility] .  He has MS and is on Aubagio, noted delayed wound healing.  He is fatigued , he is disabled. He has irregular sleep habits , based on his hobby. He goes to bed at 2 AM and  Goes to sleep promptly , he rises at 8 AM but wears his CPAP nightly. He drinks about a gallon of caffeinated sweet  tea  a day, but no coffee and also rarely soda.  Nonsmoker and nondrinker. The patient for a score prior to his last sleep study from 07-13-12 was 15 points and is becks inventory 25 points the patient now endorsed the Epworth sleepiness score at 7 points on CPAP and  depression score at 0  points. Last visit note at [facility], [facility] :  [name] has been a long time user of CPAP since early 2008. In December 2013 he returned upon request of his DME company-[employer] for a sleep consult. The patient had stated that sleepiness had increased but he also was on chronic pain-  not sleeping well at baseline and getting  an average of 4 hours sleep. His old CPAP machine setting was not known and the memory card  was not readable at the time of our encounter. The patient returned for a sleep study on 07/13/2012 and tested positive for a severe  obstructive sleep apnea,   his AHI was 96.1, his RDI 98.6. Oxygen saturation at nadir was 79% but only for 17.4 minutes.The patient's study was SPLIT and  CPAP initiated and titrated to 11 cm water. He had no central apneas and response to the titration in his AHI was significantly reduced. He used and nasal comfort gel mask.  EPR was set at 3 cm water. The patient also had a download from the CPAP in spring of this year ( 2014)  showing borderline compliance.I have another download available for the last month and his average daily used to time is 4 hours 4 minutes so he is just concerned  compliant. His AHI is now 1.1, his median airleak is moderate 4.8 L a minute. He states that he still is not able to sleep longer by using CPAP at the CPAP has significantly reduced his snoring and apneic events his fatigue severity score today is 48 points , his Epworth score is 6 points  -reduced in comparison to his last visit. His March  14 download showed an AHI of 1.4 and June a residual  AHI of 1.2.Is noted that if he doesn't wear his machine and mask he awakens with a very sore throat from the loud , thunderous snoring.His bedtime is 1 AM to 6 AM , will only get 4 hours average of nightly sleep- all his life , not a recent He has no cyclic sleep pattern, no depression history , and had a cervical fusion. He has frequent pneumonia , rhinitis,  and rarely  a sinus infection.  He just received disability 2014 for chronic pain, and MS .I also learned that [name] who has undergone multiple back surgeries in the past and has a history of chronic back pain,and was  diagnosed with a neurogenic bladder, developed dysesthesias earlier this year and was diagnosed by MRI with multiple sclerosis. He is treated by Dr. Rae Mar , who has  started him on Aubagio  REVIEW OF SYSTEMS: Out of a complete 14 system review of symptoms, the patient complains only of the following symptoms, and all other reviewed systems are negative.  Constipation, joint pain, joint swelling, back pain, aching muscles, muscle cramps, walking difficulty, numbness or weakness.   ALLERGIES: No Known Allergies  HOME MEDICATIONS: Outpatient Prescriptions Prior to Visit  Medication Sig Dispense Refill  . amphetamine-dextroamphetamine (ADDERALL) 10 MG tablet Take 10 mg by mouth 2 (two) times daily with a meal.    . DULoxetine (CYMBALTA) 60 MG capsule     . gabapentin (NEURONTIN) 300 MG capsule Take 300 mg by mouth 4 (four) times daily.    Marland Kitchen HYDROcodone-acetaminophen (NORCO) 10-325 MG per tablet     . ibuprofen (ADVIL,MOTRIN) 200 MG tablet Take 800 mg by mouth. As needed    . tamsulosin (FLOMAX) 0.4 MG CAPS capsule Take 0.4 mg by mouth daily.    . Teriflunomide (AUBAGIO) 14 MG TABS Take 14 mg by mouth 1 day or 1 dose. Dr Rae Mar     No facility-administered medications prior to visit.    PAST MEDICAL HISTORY: Past Medical History  Diagnosis Date  . Morbid obesity   . Dysphonia     thoracic left sided"impingement" with heminumbness  . Chronic pain   . Depression   . Anxiety     chronic pain treated with narcotics  . MS (multiple sclerosis) 02/11/2013    [name]  . MS (multiple sclerosis)   . Sleep apnea with use of continuous positive airway pressure (CPAP) 02/11/2013    07-13-12 AHi of 98.6 /hr titrated to 11 cm water ,  average user time 4 hours and  4 minutes. Residual AHI 1.20 December 2012 .    PAST SURGICAL HISTORY: Past Surgical History  Procedure Laterality Date  . Carpal tunnel release    . Spine surgery      total of six vertebras fused, three lumbar surgeries and one anterior neck fusion  . Tonsillectomy    . Lower back fusion      surgery    FAMILY HISTORY: Family History  Problem Relation Age of Onset  . Sleep walking Son   .  Cancer Other   . Cancer Other     PHYSICAL EXAM  Filed Vitals:   08/22/14 1412  BP: 139/89  Pulse: 72  Height: 6\' 1"  (1.854 m)  Weight: 336 lb (152.409 kg)   Body mass index is 44.34 kg/(m^2).  Generalized: Well developed, in no acute distress  Neck: circumference 20 inches, Mallampati 3+   Neurological examination  Mentation: Alert oriented to time, place, history taking. Follows all commands speech and language fluent Cranial nerve II-XII: Pupils were equal round reactive to light. Extraocular movements were full, visual field were full on confrontational test. Facial sensation and strength were normal. Uvula tongue midline. Head turning and shoulder shrug  were normal and symmetric. Motor: The motor testing reveals 5 over 5 strength of all 4 extremities. Good symmetric motor tone is noted throughout.  Sensory: Sensory testing is intact to soft touch on all 4 extremities except decreased on the left upper and lower extremities. No evidence of extinction is noted.  Coordination: Cerebellar testing reveals good finger-nose-finger and heel-to-shin bilaterally.  Gait and station: Gait is wide based, very cautious and unsteady. Reflexes: Deep tendon reflexes are symmetric but depressed throughout   DIAGNOSTIC DATA (LABS, IMAGING, TESTING) - I reviewed patient records, labs, notes, testing and imaging myself where available.  ASSESSMENT AND PLAN 54 y.o. year old male  has a past medical history of Morbid obesity; Dysphonia; Chronic pain; Depression; Anxiety; MS (multiple sclerosis)  (02/11/2013); MS (multiple sclerosis); and Sleep apnea with use of continuous positive airway pressure (CPAP) (02/11/2013). here with:  1. OSA on CPAP 2. Multiple Sclerosis  Patient used his CPAP 28/30 days but he tends to not use it for >4 hours. I have asked the patient to start using the machine for >4 hours. He states that usually he forgets to put it back on when he gets up to urinate. Patient also had a leak but contributes this to shaving his beard. He has a new mask and straps coming. His MS is followed by Dr. Doyle Askew in [place]. He will follow-up in 3 months or sooner if needed.   Ward Givens, MSN, NP-C 08/22/2014, 2:29 PM [facility] [address] [place], [place] [address] [phone]  Note: This document was prepared with digital dictation and possible smart phrase technology. Any transcriptional errors that result from this process are unintentional.

## 2014-08-22 NOTE — Progress Notes (Signed)
I agree with the assessment and plan as directed by NP .The patient is known to me .   Nyelah Emmerich, MD  

## 2014-08-25 ENCOUNTER — Ambulatory Visit
Admission: RE | Admit: 2014-08-25 | Discharge: 2014-08-25 | Disposition: A | Discharge status: Home / Self Care | Payer: Self-pay | Source: Ambulatory Visit | Attending: Neurosurgery | Admitting: Neurosurgery

## 2014-08-25 DIAGNOSIS — M4316 Spondylolisthesis, lumbar region: Secondary | ICD-10-CM

## 2014-08-25 MED ORDER — GADOBENATE DIMEGLUMINE 529 MG/ML IV SOLN
20.0000 mL | Freq: Once | INTRAVENOUS | Status: AC | PRN
  Administered 2014-08-25: 20 mL via INTRAVENOUS

## 2014-09-07 ENCOUNTER — Other Ambulatory Visit: Payer: Self-pay | Admitting: Neurosurgery

## 2014-09-07 DIAGNOSIS — M5136 Other intervertebral disc degeneration, lumbar region: Secondary | ICD-10-CM

## 2014-09-12 ENCOUNTER — Ambulatory Visit
Admission: RE | Admit: 2014-09-12 | Discharge: 2014-09-12 | Disposition: A | Discharge status: Home / Self Care | Payer: Medicare Other | Source: Ambulatory Visit | Attending: Neurosurgery | Admitting: Neurosurgery

## 2014-09-12 DIAGNOSIS — M5136 Other intervertebral disc degeneration, lumbar region: Secondary | ICD-10-CM

## 2014-09-23 ENCOUNTER — Other Ambulatory Visit: Payer: Self-pay | Admitting: Neurosurgery

## 2014-09-23 DIAGNOSIS — M4316 Spondylolisthesis, lumbar region: Secondary | ICD-10-CM

## 2014-09-27 ENCOUNTER — Ambulatory Visit
Admission: RE | Admit: 2014-09-27 | Discharge: 2014-09-27 | Disposition: A | Discharge status: Home / Self Care | Payer: Medicare Other | Source: Ambulatory Visit | Attending: Neurosurgery | Admitting: Neurosurgery

## 2014-09-27 DIAGNOSIS — M4316 Spondylolisthesis, lumbar region: Secondary | ICD-10-CM

## 2014-09-27 MED ORDER — IOHEXOL 180 MG/ML  SOLN: 1.0000 mL | Freq: Once | INTRAMUSCULAR | Status: AC | PRN

## 2014-09-27 MED ORDER — METHYLPREDNISOLONE ACETATE 40 MG/ML INJ SUSP (RADIOLOG: 120.0000 mg | Freq: Once | INTRAMUSCULAR | Status: DC

## 2014-09-27 NOTE — Discharge Instructions (Signed)

## 2014-09-29 ENCOUNTER — Other Ambulatory Visit: Payer: Self-pay | Admitting: Neurosurgery

## 2014-09-29 DIAGNOSIS — M5412 Radiculopathy, cervical region: Secondary | ICD-10-CM

## 2014-10-06 ENCOUNTER — Ambulatory Visit
Admission: RE | Admit: 2014-10-06 | Discharge: 2014-10-06 | Disposition: A | Discharge status: Home / Self Care | Payer: Medicare Other | Source: Ambulatory Visit | Attending: Neurosurgery | Admitting: Neurosurgery

## 2014-10-06 DIAGNOSIS — M5412 Radiculopathy, cervical region: Secondary | ICD-10-CM

## 2014-10-06 MED ORDER — IOHEXOL 300 MG/ML  SOLN
1.0000 mL | Freq: Once | INTRAMUSCULAR | Status: AC | PRN
  Administered 2014-10-06: 1 mL via EPIDURAL

## 2014-10-06 MED ORDER — IOHEXOL 180 MG/ML  SOLN: 1.0000 mL | Freq: Once | INTRAMUSCULAR | Status: DC | PRN

## 2014-10-06 MED ORDER — IOHEXOL 300 MG/ML  SOLN: 1.0000 mL | Freq: Once | INTRAMUSCULAR | Status: AC | PRN

## 2014-10-06 MED ORDER — TRIAMCINOLONE ACETONIDE 40 MG/ML IJ SUSP (RADIOLOGY)
60.0000 mg | Freq: Once | INTRAMUSCULAR | Status: AC
  Administered 2014-10-06: 60 mg via EPIDURAL

## 2014-10-06 NOTE — Discharge Instructions (Signed)

## 2014-11-23 ENCOUNTER — Encounter: Payer: Self-pay | Admitting: Neurology

## 2014-11-23 ENCOUNTER — Telehealth: Payer: Self-pay

## 2014-11-23 ENCOUNTER — Ambulatory Visit (INDEPENDENT_AMBULATORY_CARE_PROVIDER_SITE_OTHER): Payer: Medicare Other | Admitting: Neurology

## 2014-11-23 VITALS — BP 129/78 | HR 79 | Resp 20 | Ht 73.62 in | Wt 345.0 lb

## 2014-11-23 DIAGNOSIS — G4733 Obstructive sleep apnea (adult) (pediatric): Secondary | ICD-10-CM | POA: Diagnosis not present

## 2014-11-23 NOTE — Patient Instructions (Signed)
Dalfampridine exteneded release tablets What is this medicine? DALFAMPRIDINE (dal FAM pri deen) may help improve walking in patients with multiple sclerosis. This medicine is not a cure. This medicine may be used for other purposes; ask your health care provider or pharmacist if you have questions. COMMON BRAND NAME(S): Ampyra What should I tell my health care provider before I take this medicine? They need to know if you have any of these conditions: -kidney disease -seizures -an unusual or allergic reaction to dalfampridine, other medicines, foods, dyes, or preservatives -pregnant or trying to get pregnant -breast-feeding How should I use this medicine? Take this medicine by mouth with a glass of water. Follow the directions on the prescription label. Take your medicine at regular intervals. Do not take it more often than directed. Do not cut, crush, chew, or dissolve this medicine. You can take it with or without food. A special MedGuide will be given to you by the pharmacist with each prescription and refill. Be sure to read this information carefully each time. Talk to your pediatrician regarding the use of this medicine in children. Special care may be needed. Overdosage: If you think you've taken too much of this medicine contact a poison control center or emergency room at once. Overdosage: If you think you have taken too much of this medicine contact a poison control center or emergency room at once. NOTE: This medicine is only for you. Do not share this medicine with others. What if I miss a dose? If you miss a dose, take it as soon as you can. Do not take double or extra doses. Do not take more than 2 tablets in a 24-hour period. Separate each tablet by 12 hours. What may interact with this medicine? Other forms of dalfampridine such as 4-aminopyridine such as 4-AP or fampridine This list may not describe all possible interactions. Give your health care provider a list of all the  medicines, herbs, non-prescription drugs, or dietary supplements you use. Also tell them if you smoke, drink alcohol, or use illegal drugs. Some items may interact with your medicine. What should I watch for while using this medicine? Tell your doctor or healthcare professional if your symptoms do not start to get better or if they get worse. You may get drowsy or dizzy. Do not drive, use machinery, or do anything that needs mental alertness until you know how this medicine affects you. Do not stand or sit up quickly, especially if you are an older patient. This reduces the risk of dizzy or fainting spells. What side effects may I notice from receiving this medicine? Side effects that you should report to your doctor or health care professional as soon as possible: -allergic reactions like skin rash, itching or hives, swelling of the face, lips, or tongue -confusion -pain in the lower back or side -pain when urinating -seizures -trouble passing urine or change in the amount of urine Side effects that usually do not require medical attention (Report these to your doctor or health care professional if they continue or are bothersome.): -constipation -nausea -pain, tingling, numbness in the hands or feet -trouble sleeping -unusually weak or tired This list may not describe all possible side effects. Call your doctor for medical advice about side effects. You may report side effects to FDA at 1-800-FDA-1088. Where should I keep my medicine? Keep out of the reach of children. Store at room temperature between 15 and 30 degrees C (59 and 86 degrees F). Throw away any unused medicine  after the expiration date. NOTE: This sheet is a summary. It may not cover all possible information. If you have questions about this medicine, talk to your doctor, pharmacist, or health care provider.  2015, [employer]. (2012-01-03 16:28:16)

## 2014-11-23 NOTE — Progress Notes (Signed)
SLEEP CLINIC [facility]   PATIENT: Mark [name]. DOB: 12/21/1960  REASON FOR VISIT: follow up HISTORY FROM: patient  HISTORY OF PRESENT ILLNESS:  MM , last visit . : [name] is a 54 year old male with history of obstructive sleep Apnea. He returns today for a 90 day compliance download. He brought his machine with him today and his reports shows an AHI of 1.1 at 11 cm of water, uses his machine for 4 hours and 46 minutes a night, with 93 %compliance. However he only uses his machine for > 4 hours 19/30 days- 63 %. Leak is 28.9L/min in the 95th percentile. His Epworth score is 4 points was previously 7 points. His fatigue severity score is 61 was previously 48. Patient states that his fatigue is due to MS and some disc issues in his back. Dr. Tressa Busman in [place] follows him for his MS. Patient reports that he gets about 10 hours of sleep a night. He goes to bed around 11-12 PM and arises at 10-11 AM.  He denies having trouble falling asleep or staying a sleep. States that the gets up about 1-2 times a night to urinate. Overall patient feels that CPAP has improved his sleepiness. Since the last visit the patient has had no new medical issues.   HISTORY 02/16/14 [facility]): [name] has been able to reduce his BMI and reduce thus his degree of OSA. He lost 24 pounds.  His CPAP download today was not possible , he forgot the machine or memory chip. He is followed by [facility] .  He has MS and is on Aubagio, noted delayed wound healing.  He is fatigued , he is disabled. He has irregular sleep habits , based on his hobby. He goes to bed at 2 AM and  Goes to sleep promptly , he rises at 8 AM but wears his CPAP nightly. He drinks about a gallon of caffeinated sweet  tea  a day, but no coffee and also rarely soda.  Nonsmoker and nondrinker. The patient for a score prior to his last sleep study from 07-13-12 was 15 points and is becks inventory 25 points the patient now  endorsed the Epworth sleepiness score at 7 points on CPAP and depression score at 0  points. Last visit note at [facility], [facility] :  [name] has been a long time user of CPAP since early 2008. In December 2013 he returned upon request of his DME company-i.e[employer] for a sleep consult. The patient had stated that sleepiness had increased but he also was on chronic pain-  not sleeping well at baseline and getting  an average of 4 hours sleep. His old CPAP machine setting was not known and the memory card  was not readable at the time of our encounter. The patient returned for a sleep study on 07/13/2012 and tested positive for a severe  obstructive sleep apnea,   his AHI was 96.1, his RDI 98.6.  Oxygen saturation at nadir was 79% but only for 17.4 minutes.The patient's study was SPLIT and  CPAP initiated and titrated to 11 cm water. He had no central apneas and response to the titration in his AHI was significantly reduced. He used and nasal comfort gel mask.  EPR was set at 3 cm water. The patient also had a download from the CPAP in spring of this year ( 2014)  showing borderline compliance.I have another download available for the last month and his average  daily used to time is 4 hours 4 minutes so he is just concerned compliant.  His AHI is now 1.1, his median airleak is moderate 4.8 L a minute. He states that he still is not able to sleep longer by using CPAP at the CPAP has significantly reduced his snoring and apneic events his fatigue severity score today is 48 points , his Epworth score is 6 points  -reduced in comparison to his last visit. His March  14 download showed an AHI of 1.4 and June a residual  AHI of 1.2.Is noted that if he doesn't wear his machine and mask he awakens with a very sore throat from the loud , thunderous snoring.His bedtime is 1 AM to 6 AM , will only get 4 hours average of nightly sleep- all his life , not a recent He has no cyclic sleep pattern, no depression history , and had a  cervical fusion. He has frequent pneumonia , rhinitis,  and rarely a sinus infection.   He just received disability 2014 for chronic pain, and MS .I also learned that [name] who has undergone multiple back surgeries in the past and has a history of chronic back pain,and was  diagnosed with a neurogenic bladder, developed dysesthesias earlier this year and was diagnosed by MRI with multiple sclerosis. He is treated by Dr. Rae Mar , who has  started him on Aubagio.   [name] is seen today for a CPAP follow-up visit on 11-23-14. He has a diagnosis of obstructive sleep apnea as described above he is on CPAP currently using 11 cm water with full-time EPR of 3 cm water. At download over the last January was obtained. At the time he had 93% compliance for days of use and 63% compliance for over 4 hours of use his average daily time of use is 4 hours and 27 minutes and his residual AHI is 1.1 there is a high air leak which may be explained by the patient's facial hair. His Epworth sleepiness score is 3 points and his fatigue severity score 63 points. The patient's machine today did not have a downloadable chip in it so we are calling his DME which is [employer] in [place]. In addition his high degree of fatigue can be relaxed related to his multiple sclerosis. He reports that he can hardly move his legs his legs just do not want to work cord" he does not have major pain with it but he is weak and in coordinated. He has fallen a couple of times. Also has right puffy ankles.  He continues to take AUBAGi0 and is followed by Dr. Doyle Askew.  He has been prescribed Adderall by Dr. Doyle Askew but he feels that he needs a higher dose or perhaps a more frequent dosing. His orthopedist prescribed NARCO, 120 tab at a time.   REVIEW OF SYSTEMS: Out of a complete 14 system review of symptoms, the patient complains only of the following symptoms, and all other reviewed systems are negative.  Constipation,  joint pain, joint swelling, back pain, aching muscles, muscle cramps, walking difficulty, numbness or weakness.   Fatigue severity score is extremely high at 63 points but his sleepiness score is 3 points and his compliance data show a 93% 4 days of use. CPAP is set to 11 cm water the 3 cm EPR residual AHI was 1.1.  ALLERGIES: No Known Allergies  HOME MEDICATIONS: Outpatient Prescriptions Prior to Visit  Medication Sig Dispense Refill  . amphetamine-dextroamphetamine (ADDERALL) 10  MG tablet Take 10 mg by mouth 2 (two) times daily with a meal.    . DULoxetine (CYMBALTA) 60 MG capsule     . gabapentin (NEURONTIN) 300 MG capsule Take 300 mg by mouth 4 (four) times daily.    Marland Kitchen HYDROcodone-acetaminophen (NORCO) 10-325 MG per tablet     . ibuprofen (ADVIL,MOTRIN) 200 MG tablet Take 800 mg by mouth. As needed    . tamsulosin (FLOMAX) 0.4 MG CAPS capsule Take 0.4 mg by mouth daily.    . Teriflunomide (AUBAGIO) 14 MG TABS Take 14 mg by mouth 1 day or 1 dose. Dr Rae Mar    . tiZANidine (ZANAFLEX) 4 MG tablet Take 4 mg by mouth.     No facility-administered medications prior to visit.    PAST MEDICAL HISTORY: Past Medical History  Diagnosis Date  . Morbid obesity   . Dysphonia     thoracic left sided"impingement" with heminumbness  . Chronic pain   . Depression   . Anxiety     chronic pain treated with narcotics  . MS (multiple sclerosis) 02/11/2013    [name]  . MS (multiple sclerosis)   . Sleep apnea with use of continuous positive airway pressure (CPAP) 02/11/2013    07-13-12 AHi of 98.6 /hr titrated to 11 cm water ,  average user time 4 hours and 4 minutes. Residual AHI 1.20 December 2012 .    PAST SURGICAL HISTORY: Past Surgical History  Procedure Laterality Date  . Carpal tunnel release    . Spine surgery      total of six vertebras fused, three lumbar surgeries and one anterior neck fusion  . Tonsillectomy    . Lower back fusion      surgery    FAMILY  HISTORY: Family History  Problem Relation Age of Onset  . Sleep walking Son   . Cancer Other   . Cancer Other     PHYSICAL EXAM  Filed Vitals:   11/23/14 1439  BP: 129/78  Pulse: 79  Resp: 20  Height: 6' 1.62" (1.87 m)  Weight: 345 lb (156.491 kg)   Body mass index is 44.75 kg/(m^2).  Generalized: Well developed, in no acute distress  . Patient appears fatigued he has lost some coordinated power in his legs. Neck: circumference 20 inches, Mallampati 3+   Neurological examination  Mentation: Alert oriented to time, place, history taking. Follows all commands speech and language fluent Cranial nerve II-XII: Pupils were equal round reactive to light. Extraocular movements were full, visual field were full on confrontational test. Facial sensation and strength were normal. Uvula tongue midline. Head turning and shoulder shrug  were normal and symmetric. Motor: The motor testing reveals 5 over 5 strength of all 4 extremities. Good symmetric motor tone is noted throughout.  Sensory: Sensory testing is intact to soft touch on all 4 extremities except decreased on the left upper and lower extremities.  No evidence of extinction is noted.  Coordination: Cerebellar testing reveals good finger-nose-finger and heel-to-shin bilaterally.  Gait and station: Gait is wide based, very cautious and unsteady. Reflexes: Deep tendon reflexes are symmetric but depressed throughout   DIAGNOSTIC DATA (LABS, IMAGING, TESTING) - I reviewed patient records, labs, notes, testing and imaging myself where available.  ASSESSMENT AND PLAN   1. OSA on CPAP- compliance visit .  2. Multiple Sclerosis- followed outside, as is his pain, back, arthritis , etc.   Patient used his CPAP 28/30 days but he tends to not  use it for >4 hours.  I have asked the patient to start using the machine for >4 hours. He states that usually he forgets to put it back on when he gets up to urinate . Patient also had a leak but  contributes this to shaving his beard. He has a new mask and straps coming.   His MS is followed by Dr. Doyle Askew in [place].   He will follow-up for sleep  In 12 months , CPAP - or sooner if needed.    11/23/2014, 2:51 PM [facility] [address] [place],  [address] [phone]  Note: This document was prepared with digital dictation and possible smart phrase technology. Any transcriptional errors that result from this process are unintentional.

## 2014-11-23 NOTE — Telephone Encounter (Signed)
Today's office visit with Dr. Brett Fairy faxed to Dr. Bjorn Loser at [facility] per Dr. Edwena Felty request.

## 2014-12-01 ENCOUNTER — Other Ambulatory Visit: Payer: Self-pay | Admitting: Urology

## 2014-12-01 DIAGNOSIS — G35 Multiple sclerosis: Secondary | ICD-10-CM

## 2014-12-01 DIAGNOSIS — G35D Multiple sclerosis, unspecified: Secondary | ICD-10-CM

## 2014-12-13 ENCOUNTER — Ambulatory Visit (INDEPENDENT_AMBULATORY_CARE_PROVIDER_SITE_OTHER): Payer: Medicare Other | Admitting: Family Medicine

## 2014-12-13 ENCOUNTER — Encounter: Payer: Self-pay | Admitting: Family Medicine

## 2014-12-13 VITALS — BP 138/78 | HR 82 | Ht 73.0 in | Wt 344.0 lb

## 2014-12-13 DIAGNOSIS — Z13220 Encounter for screening for lipoid disorders: Secondary | ICD-10-CM | POA: Diagnosis not present

## 2014-12-13 DIAGNOSIS — H9190 Unspecified hearing loss, unspecified ear: Secondary | ICD-10-CM | POA: Insufficient documentation

## 2014-12-13 DIAGNOSIS — Z1159 Encounter for screening for other viral diseases: Secondary | ICD-10-CM

## 2014-12-13 DIAGNOSIS — G35 Multiple sclerosis: Secondary | ICD-10-CM

## 2014-12-13 DIAGNOSIS — R351 Nocturia: Secondary | ICD-10-CM | POA: Diagnosis not present

## 2014-12-13 DIAGNOSIS — R030 Elevated blood-pressure reading, without diagnosis of hypertension: Secondary | ICD-10-CM | POA: Diagnosis not present

## 2014-12-13 DIAGNOSIS — IMO0001 Reserved for inherently not codable concepts without codable children: Secondary | ICD-10-CM

## 2014-12-13 DIAGNOSIS — H9193 Unspecified hearing loss, bilateral: Secondary | ICD-10-CM

## 2014-12-13 NOTE — Progress Notes (Signed)
Subjective:    Patient ID: Mark Spitz., male    DOB: [birth year], 54 y.o.   MRN: [id number]  HPI  54 year old male with a history of multiple sclerosis , obstructive sleep apnea and severe obesity comes in today to establish care. His wife is also a patient here.  He drinks about a gallon of sweet tea a day.  He wants to be tested for diabetes. He reports nocturia as well.   [name] writes the Hydrocodone for chronic neck and back pain. Hx of 5 back surgeries.   MS - Dr. Olen Pel writes the [place] and the adderral and gabapentin and IBU and zanaflex for his MS.   OSA - followed at [facility] neurology.    Review of Systems  Constitutional: Negative for fever, diaphoresis and unexpected weight change.  HENT: Negative for hearing loss, rhinorrhea, sneezing and tinnitus.         Ear ringing  Eyes: Negative for visual disturbance.  Respiratory: Negative for cough and wheezing.   Cardiovascular: Negative for chest pain and palpitations.  Gastrointestinal: Negative for nausea, vomiting, diarrhea and blood in stool.  Genitourinary: Negative for dysuria and discharge.        Positive for nighttime urination  Musculoskeletal: Positive for myalgias and arthralgias.  Skin: Negative for rash.  Neurological: Negative for headaches.        Memory loss +  Hematological: Negative for adenopathy.  Psychiatric/Behavioral: Positive for dysphoric mood. Negative for sleep disturbance. The patient is not nervous/anxious.     BP 138/78 mmHg  Pulse 82  Ht 6\' 1"  (1.854 m)  Wt 344 lb (156.037 kg)  BMI 45.40 kg/m2  SpO2 93%    No Known Allergies  Past Medical History  Diagnosis Date  . Morbid obesity   . Dysphonia     thoracic left sided"impingement" with heminumbness  . Chronic pain   . Depression   . Anxiety     chronic pain treated with narcotics  . MS (multiple sclerosis) 02/11/2013    [name]  . MS (multiple sclerosis)   . Sleep apnea with use of continuous  positive airway pressure (CPAP) 02/11/2013    07-13-12 AHi of 98.6 /hr titrated to 11 cm water ,  average user time 4 hours and 4 minutes. Residual AHI 1.20 December 2012 .    Past Surgical History  Procedure Laterality Date  . Carpal tunnel release Left     [name]  . Spine surgery      total of six vertebras fused, three lumbar surgeries and one anterior neck fusion  . Tonsillectomy    . Lower back fusion      surgery    History   Social History  . Marital Status: Married    Spouse Name: Mark [name]  . Number of Children: 2  . Years of Education: 12+   Occupational History  . Not on file.   Social History Main Topics  . Smoking status: Never Smoker   . Smokeless tobacco: Never Used  . Alcohol Use: Yes  . Drug Use: No  . Sexual Activity: Not on file   Other Topics Concern  . Not on file   Social History Narrative   Last download from 2011 showed good response to 12 cm water with 3 cm EPR, but we have no record of the baseline AI.    Need to order  the old paper copies and send patient for retitration  to lab, after five  years plus this will be a SPLIT at 88 and at 3% score. This calendar year preferred for deductible costs.    Patient is married Mark [name]) and lives at home with his wife, daughter, son-in-law and grandchild.   Patient has two adult children.   Patient is right-handed.   Patient is disabled.   Patient has a college education.   Patient drinks a gallon of tea daily.          Family History  Problem Relation Age of Onset  . Sleep walking Son   . Cancer Other   . Cancer Other     Outpatient Encounter Prescriptions as of 12/13/2014  Medication Sig  . amphetamine-dextroamphetamine (ADDERALL) 10 MG tablet Take 10 mg by mouth 2 (two) times daily with a meal.  . DULoxetine (CYMBALTA) 60 MG capsule   . gabapentin (NEURONTIN) 300 MG capsule Take 300 mg by mouth 4 (four) times daily.  Marland Kitchen HYDROcodone-acetaminophen (NORCO) 10-325 MG per tablet   . ibuprofen  (ADVIL,MOTRIN) 200 MG tablet Take 800 mg by mouth. As needed  . tamsulosin (FLOMAX) 0.4 MG CAPS capsule Take 0.4 mg by mouth daily.  . Teriflunomide (AUBAGIO) 14 MG TABS Take 14 mg by mouth 1 day or 1 dose. Dr Rae Mar  . tiZANidine (ZANAFLEX) 4 MG tablet Take 4 mg by mouth.   No facility-administered encounter medications on file as of 12/13/2014.          Objective:   Physical Exam  Constitutional: He is oriented to person, place, and time. He appears well-developed and well-nourished.  + obese  HENT:  Head: Normocephalic and atraumatic.  Cardiovascular: Normal rate, regular rhythm and normal heart sounds.    No carotid bruits.  Pulmonary/Chest: Effort normal and breath sounds normal.  Neurological: He is alert and oriented to person, place, and time.  Skin: Skin is warm and dry.  Psychiatric: He has a normal mood and affect. His behavior is normal.          Assessment & Plan:  Elevated BP -  Blood pressure has been up and down over the last year when I look back. I would suspect this is most likely secondary to weight and diet. Will check BMP, lipid panel and thyroid. Next  Due for hepatitis C screening. He declined HIV screening.  MS- followed by neurology.     chronic neck and back pain-followed by his neurosurgeon who also writes his narcotic pain medication. He currently is on hydrocodone 10/325 and takes 2 tabs every 4-6 hours daily. Next  Discussed need for vaccinations including tdap, shingles and pneumococcal 23 since he is technically immunosuppressed with his MS and on frequent courses of prednisone. He also has a prior history of pneumonia.   he does have bilateral hearing loss as well and does have hearing aids but his wife says he very rarely wears them.

## 2014-12-21 ENCOUNTER — Ambulatory Visit
Admission: RE | Admit: 2014-12-21 | Discharge: 2014-12-21 | Disposition: A | Discharge status: Home / Self Care | Payer: Medicare Other | Source: Ambulatory Visit | Attending: Urology | Admitting: Urology

## 2014-12-21 DIAGNOSIS — G35 Multiple sclerosis: Secondary | ICD-10-CM

## 2014-12-23 ENCOUNTER — Other Ambulatory Visit: Payer: Self-pay | Admitting: Urology

## 2014-12-23 DIAGNOSIS — G35 Multiple sclerosis: Secondary | ICD-10-CM

## 2014-12-23 DIAGNOSIS — G35D Multiple sclerosis, unspecified: Secondary | ICD-10-CM

## 2014-12-28 ENCOUNTER — Other Ambulatory Visit: Payer: Self-pay | Admitting: Neurosurgery

## 2014-12-28 DIAGNOSIS — M5412 Radiculopathy, cervical region: Secondary | ICD-10-CM

## 2014-12-29 ENCOUNTER — Other Ambulatory Visit: Payer: Self-pay | Admitting: Neurosurgery

## 2014-12-29 DIAGNOSIS — M5412 Radiculopathy, cervical region: Secondary | ICD-10-CM

## 2014-12-30 ENCOUNTER — Ambulatory Visit
Admission: RE | Admit: 2014-12-30 | Discharge: 2014-12-30 | Disposition: A | Discharge status: Home / Self Care | Payer: Medicare Other | Source: Ambulatory Visit | Attending: Neurosurgery | Admitting: Neurosurgery

## 2014-12-30 DIAGNOSIS — M5412 Radiculopathy, cervical region: Secondary | ICD-10-CM

## 2014-12-30 MED ORDER — TRIAMCINOLONE ACETONIDE 40 MG/ML IJ SUSP (RADIOLOGY)
60.0000 mg | Freq: Once | INTRAMUSCULAR | Status: AC
  Administered 2014-12-30: 60 mg via EPIDURAL

## 2014-12-30 MED ORDER — IOHEXOL 300 MG/ML  SOLN
1.0000 mL | Freq: Once | INTRAMUSCULAR | Status: AC | PRN
Start: 2014-12-30 — End: 2014-12-30
  Administered 2014-12-30: 1 mL via EPIDURAL

## 2015-01-04 ENCOUNTER — Ambulatory Visit (INDEPENDENT_AMBULATORY_CARE_PROVIDER_SITE_OTHER): Payer: Medicare Other | Admitting: Family Medicine

## 2015-01-04 ENCOUNTER — Encounter: Payer: Self-pay | Admitting: Family Medicine

## 2015-01-04 VITALS — BP 159/90 | HR 70 | Temp 98.2°F | Wt 340.0 lb

## 2015-01-04 DIAGNOSIS — Z283 Underimmunization status: Secondary | ICD-10-CM

## 2015-01-04 DIAGNOSIS — Z23 Encounter for immunization: Secondary | ICD-10-CM

## 2015-01-04 DIAGNOSIS — Z2839 Other underimmunization status: Secondary | ICD-10-CM

## 2015-01-04 DIAGNOSIS — L03116 Cellulitis of left lower limb: Secondary | ICD-10-CM | POA: Diagnosis not present

## 2015-01-04 MED ORDER — CLINDAMYCIN HCL 300 MG PO CAPS: 300.0000 mg | ORAL_CAPSULE | Freq: Three times a day (TID) | ORAL | Status: DC

## 2015-01-04 NOTE — Addendum Note (Signed)
Addended by: Isaias Cowman C on: 01/04/2015 11:35 AM   Modules accepted: Orders

## 2015-01-04 NOTE — Progress Notes (Signed)
CC: Mark [name]. is a 54 y.o. male is here for spider bite?   Subjective: HPI:  Painful ulcer moderate to severe in severity worse with touching or movement on the back of the left calf. Noticed 5 days ago and he's been waiting for her to improve on its own. He does not think it's been getting better or worse. No interventions as of yet. He denies any recent or remote trauma. He denies any claudication in either lower extremity. He has a small laceration from hitting his tiller against his right shin yesterday but denies any other skin complaints. He's never had this type of ulceration before. He denies fevers, chills, swollen lymph nodes nor any other rash. Denies swelling of either extremity recently or remotely   Review Of Systems Outlined In HPI  Past Medical History  Diagnosis Date  . Morbid obesity   . Dysphonia     thoracic left sided"impingement" with heminumbness  . Chronic pain   . Depression   . Anxiety     chronic pain treated with narcotics  . MS (multiple sclerosis) 02/11/2013    [name]  . MS (multiple sclerosis)   . Sleep apnea with use of continuous positive airway pressure (CPAP) 02/11/2013    07-13-12 AHi of 98.6 /hr titrated to 11 cm water ,  average user time 4 hours and 4 minutes. Residual AHI 1.20 December 2012 .    Past Surgical History  Procedure Laterality Date  . Carpal tunnel release Left     [name]  . Spine surgery      total of six vertebras fused, three lumbar surgeries and one anterior neck fusion  . Tonsillectomy    . Lower back fusion      surgery   Family History  Problem Relation Age of Onset  . Sleep walking Son   . Cancer Other   . Cancer Other     History   Social History  . Marital Status: Married    Spouse Name: Mark [name]  . Number of Children: 2  . Years of Education: 12+   Occupational History  . Not on file.   Social History Main Topics  . Smoking status: Never Smoker   . Smokeless tobacco: Never Used   . Alcohol Use: Yes  . Drug Use: No  . Sexual Activity: Not on file   Other Topics Concern  . Not on file   Social History Narrative   Last download from 2011 showed good response to 12 cm water with 3 cm EPR, but we have no record of the baseline AI.    Need to order  the old paper copies and send patient for retitration  to lab, after five years plus this will be a SPLIT at 15 and at 3% score. This calendar year preferred for deductible costs.    Patient is married Mark [name]) and lives at home with his wife, daughter, son-in-law and grandchild.   Patient has two adult children.   Patient is right-handed.   Patient is disabled.   Patient has a college education.   Patient drinks a gallon of tea daily.           Objective: BP 159/90 mmHg  Pulse 70  Temp(Src) 98.2 F (36.8 C) (Oral)  Wt 340 lb (154.223 kg)  Vital signs reviewed. General: Alert and Oriented, No Acute Distress HEENT: Pupils equal, round, reactive to light. Conjunctivae clear.  External ears unremarkable.  Moist mucous membranes.  Lungs: Clear and comfortable work of breathing, speaking in full sentences without accessory muscle use. Cardiac: Regular rate and rhythm.  Neuro: CN II-XII grossly intact, gait normal. Extremities: No peripheral edema.  Strong peripheral pulses.  Mental Status: No depression, anxiety, nor agitation. Logical though process. Skin: Warm and dry. Half centimeter shallow laceration on the right shin without any bleeding or signs of infection. On the back of the left calf there is a 9 mm diameter shallow ulceration with a punched out appearance quite painful to the touch. Mild erythema around the rim.  Assessment & Plan: [name] was seen today for spider bite?.  Diagnoses and all orders for this visit:  Cellulitis of left lower extremity Orders: -     clindamycin (CLEOCIN) 300 MG capsule; Take 1 capsule (300 mg total) by mouth 3 (three) times daily.   Suspect black widow versus brown  recluse spider bite, start clindamycin and keep wound covered. After 2 days of being on antibiotic feels like it's not improving to remeasure the wound and if greater than 9 mm notify me otherwise if shrinking this is reassuring.  He is uncertain about his most recent tetanus booster, he'll receive this today.  Return if symptoms worsen or fail to improve.

## 2015-01-05 LAB — LIPID PANEL
CHOLESTEROL: 140 mg/dL (ref 0–200)
HDL: 43 mg/dL (ref >=40)
LDL Cholesterol: 78 mg/dL (ref 0–99)
TRIGLYCERIDES: 93 mg/dL (ref <150)
Total CHOL/HDL Ratio: 3.3 Ratio
VLDL: 19 mg/dL (ref 0–40)

## 2015-01-05 LAB — BASIC METABOLIC PANEL WITH GFR
BUN: 19 mg/dL (ref 6–23)
CALCIUM: 9.3 mg/dL (ref 8.4–10.5)
CO2: 24 mEq/L (ref 19–32)
Chloride: 103 mEq/L (ref 96–112)
Creat: 0.78 mg/dL (ref 0.50–1.35)
Glucose, Bld: 90 mg/dL (ref 70–99)
POTASSIUM: 4.5 meq/L (ref 3.5–5.3)
Sodium: 136 mEq/L (ref 135–145)

## 2015-01-05 LAB — TSH: TSH: 1.302 u[IU]/mL (ref 0.350–4.500)

## 2015-01-05 LAB — HEPATITIS C ANTIBODY: HCV AB: NEGATIVE

## 2015-01-09 ENCOUNTER — Ambulatory Visit
Admission: RE | Admit: 2015-01-09 | Discharge: 2015-01-09 | Disposition: A | Discharge status: Home / Self Care | Payer: Medicare Other | Source: Ambulatory Visit | Attending: Urology | Admitting: Urology

## 2015-01-09 DIAGNOSIS — G35 Multiple sclerosis: Secondary | ICD-10-CM

## 2015-02-20 ENCOUNTER — Other Ambulatory Visit: Payer: Self-pay | Admitting: Neurosurgery

## 2015-02-20 DIAGNOSIS — M4316 Spondylolisthesis, lumbar region: Secondary | ICD-10-CM

## 2015-02-26 ENCOUNTER — Ambulatory Visit
Admission: RE | Admit: 2015-02-26 | Discharge: 2015-02-26 | Disposition: A | Discharge status: Home / Self Care | Payer: Medicare Other | Source: Ambulatory Visit | Attending: Neurosurgery | Admitting: Neurosurgery

## 2015-02-26 DIAGNOSIS — M4316 Spondylolisthesis, lumbar region: Secondary | ICD-10-CM

## 2015-02-26 MED ORDER — GADOBENATE DIMEGLUMINE 529 MG/ML IV SOLN
20.0000 mL | Freq: Once | INTRAVENOUS | Status: AC | PRN
  Administered 2015-02-26: 20 mL via INTRAVENOUS

## 2015-02-28 ENCOUNTER — Other Ambulatory Visit: Payer: Self-pay | Admitting: Neurosurgery

## 2015-03-02 ENCOUNTER — Other Ambulatory Visit: Payer: Medicare Other

## 2015-04-05 ENCOUNTER — Encounter (HOSPITAL_COMMUNITY)
Admission: RE | Admit: 2015-04-05 | Discharge: 2015-04-05 | Disposition: A | Discharge status: Home / Self Care | Payer: Medicare Other | Source: Ambulatory Visit | Attending: Neurosurgery | Admitting: Neurosurgery

## 2015-04-05 ENCOUNTER — Encounter (HOSPITAL_COMMUNITY): Payer: Self-pay

## 2015-04-05 DIAGNOSIS — Z01812 Encounter for preprocedural laboratory examination: Secondary | ICD-10-CM | POA: Insufficient documentation

## 2015-04-05 DIAGNOSIS — Z0183 Encounter for blood typing: Secondary | ICD-10-CM | POA: Insufficient documentation

## 2015-04-05 HISTORY — DX: Myoneural disorder, unspecified: G70.9

## 2015-04-05 HISTORY — DX: Gastrointestinal hemorrhage, unspecified: K92.2

## 2015-04-05 HISTORY — DX: Unspecified osteoarthritis, unspecified site: M19.90

## 2015-04-05 HISTORY — DX: Drug induced constipation: K59.03

## 2015-04-05 HISTORY — DX: Drug induced constipation: T40.2X5A

## 2015-04-05 HISTORY — DX: Umbilical hernia without obstruction or gangrene: K42.9

## 2015-04-05 HISTORY — DX: Pneumonia, unspecified organism: J18.9

## 2015-04-05 LAB — BASIC METABOLIC PANEL
Anion gap: 7 (ref 5–15)
BUN: 12 mg/dL (ref 6–20)
CO2: 27 mmol/L (ref 22–32)
Calcium: 9.2 mg/dL (ref 8.9–10.3)
Chloride: 102 mmol/L (ref 101–111)
Creatinine, Ser: 0.79 mg/dL (ref 0.61–1.24)
GFR calc Af Amer: >60 mL/min (ref >60)
GLUCOSE: 100 mg/dL — AB (ref 65–99)
Potassium: 4.1 mmol/L (ref 3.5–5.1)
Sodium: 136 mmol/L (ref 135–145)

## 2015-04-05 LAB — CBC
HEMATOCRIT: 39.1 % (ref 39.0–52.0)
Hemoglobin: 13.2 g/dL (ref 13.0–17.0)
MCH: 29.3 pg (ref 26.0–34.0)
MCHC: 33.8 g/dL (ref 30.0–36.0)
MCV: 86.9 fL (ref 78.0–100.0)
PLATELETS: 221 10*3/uL (ref 150–400)
RBC: 4.5 MIL/uL (ref 4.22–5.81)
RDW: 13.3 % (ref 11.5–15.5)
WBC: 7.3 10*3/uL (ref 4.0–10.5)

## 2015-04-05 LAB — TYPE AND SCREEN
ABO/RH(D): O NEG
ANTIBODY SCREEN: NEGATIVE

## 2015-04-05 LAB — SURGICAL PCR SCREEN
MRSA, PCR: NEGATIVE
Staphylococcus aureus: NEGATIVE

## 2015-04-05 NOTE — Progress Notes (Signed)
Patient states he saw Dr. Purcell Nails for a cardiology work-up 6-8 years ago but has not needed to see a cardiologist since.  Will request records for Echo and Stress test (patient states they were normal).  He saw the cardiologist due to edema in his leg which was then attributed to [place].  Patient follows up for MS with Dr. Bjorn Loser at [facility] in [place].  Patient's PCP is METHENEY,CATHERINE, MD

## 2015-04-05 NOTE — Pre-Procedure Instructions (Signed)
    Zymire Turnbo Harbin  04/05/2015      [place] [place] [facility] [place] [address] [place] [address] Phone: [phone] Fax: [phone]    Your procedure is scheduled on Monday April 17 2015 at 1000 a.m.  Report to [facility] Admitting at 800 A.M.  Call this number if you have problems the morning of surgery:  [phone]   Remember:  Do not eat food or drink liquids after midnight.  Take these medicines the morning of surgery with A SIP OF WATER Duloxetine (Cymbalta), Gabapentin (Neurontin), Tamsulosin (Flomax) If needed: Tizanidine (Zanaflex)  STOP: ALL Vitamins, Supplements, Effient and Herbal Medications, Fish Oils, Aspirins, NSAIDs (Nonsteroidal Anti-inflammatories such as Ibuprofen, Aleve, or Advil), and Goody's/BC Powders 7 days prior to surgery, until after surgery as directed by your physician. (On Monday September 19th).    Do not wear jewelry, make-up or nail polish.  Do not wear lotions, powders, or perfumes.  You may wear deodorant.  Do not shave 48 hours prior to surgery.  Men may shave face and neck.  Do not bring valuables to the hospital.  [facility] is not responsible for any belongings or valuables.  Contacts, dentures or bridgework may not be worn into surgery.  Leave your suitcase in the car.  After surgery it may be brought to your room.  For patients admitted to the hospital, discharge time will be determined by your treatment team.  Patients discharged the day of surgery will not be allowed to drive home.   Special instructions:  Please follow these instructions carefully:  1. Shower with CHG Soap the night before surgery and the morning of Surgery. 2. If you choose to wash your hair, wash your hair first as usual with your normal shampoo. 3. After you shampoo, rinse your hair and body thoroughly to remove the Shampoo. 4. Use CHG as you would  any other liquid soap. You can apply chg directly to the skin and wash gently with scrungie or a clean washcloth. 5. Apply the CHG Soap to your body ONLY FROM THE NECK DOWN. Do not use on open wounds or open sores. Avoid contact with your eyes, ears, mouth and genitals (private parts). Wash genitals (private parts) with your normal soap. 6. Wash thoroughly, paying special attention to the area where your surgery will be performed. 7. Thoroughly rinse your body with warm water from the neck down. 8. DO NOT shower/wash with your normal soap after using and rinsing off the CHG Soap. 9. Pat yourself dry with a clean towel.  10. Wear clean pajamas.  11. Place clean sheets on your bed the night of your first shower and do not sleep with pets.  Day of Surgery  Do not apply any lotions/deodorants the morning of surgery. Please wear clean clothes to the hospital/surgery center.    Please read over the following fact sheets that you were given. Pain Booklet, Coughing and Deep Breathing, Blood Transfusion Information, MRSA Information and Surgical Site Infection Prevention

## 2015-04-14 NOTE — Progress Notes (Signed)
Phone call made to medical records release number at [facility] [place] [phone]). No answer and left message for records.

## 2015-04-16 MED ORDER — DEXTROSE 5 % IV SOLN
3.0000 g | INTRAVENOUS | Status: AC
  Administered 2015-04-17: 2 g via INTRAVENOUS
  Administered 2015-04-17: 3 g via INTRAVENOUS
  Filled 2015-04-16: qty 3000

## 2015-04-17 ENCOUNTER — Encounter (HOSPITAL_COMMUNITY): Payer: Self-pay | Admitting: Anesthesiology

## 2015-04-17 ENCOUNTER — Inpatient Hospital Stay (HOSPITAL_COMMUNITY): Payer: Medicare Other | Admitting: Emergency Medicine

## 2015-04-17 ENCOUNTER — Inpatient Hospital Stay (HOSPITAL_COMMUNITY): Payer: Medicare Other | Admitting: Anesthesiology

## 2015-04-17 ENCOUNTER — Inpatient Hospital Stay (HOSPITAL_COMMUNITY)
Admission: RE | Admit: 2015-04-17 | Discharge: 2015-04-19 | DRG: 460 | Disposition: A | Discharge status: Home / Self Care | Payer: Medicare Other | Source: Ambulatory Visit | Attending: Neurosurgery | Admitting: Neurosurgery

## 2015-04-17 ENCOUNTER — Inpatient Hospital Stay (HOSPITAL_COMMUNITY): Payer: Medicare Other

## 2015-04-17 ENCOUNTER — Encounter (HOSPITAL_COMMUNITY)
Admission: RE | Disposition: A | Discharge status: Home / Self Care | Payer: Medicare Other | Source: Ambulatory Visit | Attending: Neurosurgery

## 2015-04-17 DIAGNOSIS — M4806 Spinal stenosis, lumbar region: Principal | ICD-10-CM | POA: Diagnosis present

## 2015-04-17 DIAGNOSIS — G35 Multiple sclerosis: Secondary | ICD-10-CM | POA: Diagnosis present

## 2015-04-17 DIAGNOSIS — G8929 Other chronic pain: Secondary | ICD-10-CM | POA: Diagnosis present

## 2015-04-17 DIAGNOSIS — M5126 Other intervertebral disc displacement, lumbar region: Secondary | ICD-10-CM | POA: Diagnosis present

## 2015-04-17 DIAGNOSIS — I739 Peripheral vascular disease, unspecified: Secondary | ICD-10-CM | POA: Diagnosis present

## 2015-04-17 DIAGNOSIS — Z6841 Body Mass Index (BMI) 40.0 and over, adult: Secondary | ICD-10-CM | POA: Diagnosis not present

## 2015-04-17 DIAGNOSIS — G4730 Sleep apnea, unspecified: Secondary | ICD-10-CM | POA: Diagnosis present

## 2015-04-17 DIAGNOSIS — F329 Major depressive disorder, single episode, unspecified: Secondary | ICD-10-CM | POA: Diagnosis present

## 2015-04-17 DIAGNOSIS — M549 Dorsalgia, unspecified: Secondary | ICD-10-CM | POA: Diagnosis present

## 2015-04-17 DIAGNOSIS — F419 Anxiety disorder, unspecified: Secondary | ICD-10-CM | POA: Diagnosis present

## 2015-04-17 DIAGNOSIS — R490 Dysphonia: Secondary | ICD-10-CM | POA: Diagnosis present

## 2015-04-17 DIAGNOSIS — Z419 Encounter for procedure for purposes other than remedying health state, unspecified: Secondary | ICD-10-CM

## 2015-04-17 HISTORY — PX: POSTERIOR LUMBAR FUSION: SHX6036

## 2015-04-17 SURGERY — POSTERIOR LUMBAR FUSION 1 WITH HARDWARE REMOVAL
Anesthesia: General | Site: Back

## 2015-04-17 MED ORDER — CEFAZOLIN SODIUM-DEXTROSE 2-3 GM-% IV SOLR
INTRAVENOUS | Status: AC
  Filled 2015-04-17: qty 50

## 2015-04-17 MED ORDER — ONDANSETRON HCL 4 MG/2ML IJ SOLN
INTRAMUSCULAR | Status: AC
  Filled 2015-04-17: qty 2

## 2015-04-17 MED ORDER — SUGAMMADEX SODIUM 200 MG/2ML IV SOLN
INTRAVENOUS | Status: AC
  Filled 2015-04-17: qty 2

## 2015-04-17 MED ORDER — BUPIVACAINE LIPOSOME 1.3 % IJ SUSP
INTRAMUSCULAR | Status: DC | PRN
  Administered 2015-04-17: 20 mL

## 2015-04-17 MED ORDER — GLYCOPYRROLATE 0.2 MG/ML IJ SOLN
INTRAMUSCULAR | Status: DC | PRN
  Administered 2015-04-17: .8 mg via INTRAVENOUS
  Administered 2015-04-17: 0.2 mg via INTRAVENOUS

## 2015-04-17 MED ORDER — SODIUM CHLORIDE 0.9 % IJ SOLN
INTRAMUSCULAR | Status: AC
  Filled 2015-04-17: qty 10

## 2015-04-17 MED ORDER — PROPOFOL 10 MG/ML IV BOLUS
INTRAVENOUS | Status: AC
  Filled 2015-04-17: qty 20

## 2015-04-17 MED ORDER — ESMOLOL HCL 10 MG/ML IV SOLN
INTRAVENOUS | Status: DC | PRN
  Administered 2015-04-17: 20 mg via INTRAVENOUS
  Administered 2015-04-17: 10 mg via INTRAVENOUS

## 2015-04-17 MED ORDER — BACITRACIN 50000 UNITS IM SOLR
INTRAMUSCULAR | Status: DC | PRN
  Administered 2015-04-17: 10:00:00

## 2015-04-17 MED ORDER — VANCOMYCIN HCL 1000 MG IV SOLR
INTRAVENOUS | Status: DC | PRN
  Administered 2015-04-17: 1000 mg

## 2015-04-17 MED ORDER — MENTHOL 3 MG MT LOZG: 1.0000 | LOZENGE | OROMUCOSAL | Status: DC | PRN

## 2015-04-17 MED ORDER — NEOSTIGMINE METHYLSULFATE 10 MG/10ML IV SOLN
INTRAVENOUS | Status: DC | PRN
  Administered 2015-04-17: 5 mg via INTRAVENOUS

## 2015-04-17 MED ORDER — ACETAMINOPHEN 650 MG RE SUPP: 650.0000 mg | RECTAL | Status: DC | PRN

## 2015-04-17 MED ORDER — THROMBIN 20000 UNITS EX SOLR
CUTANEOUS | Status: DC | PRN
  Administered 2015-04-17: 10:00:00 via TOPICAL

## 2015-04-17 MED ORDER — GABAPENTIN 600 MG PO TABS
600.0000 mg | ORAL_TABLET | Freq: Four times a day (QID) | ORAL | Status: DC
  Administered 2015-04-17 – 2015-04-19 (×7): 600 mg via ORAL
  Filled 2015-04-17 (×7): qty 1

## 2015-04-17 MED ORDER — DEXAMETHASONE SODIUM PHOSPHATE 10 MG/ML IJ SOLN
10.0000 mg | INTRAMUSCULAR | Status: AC
  Administered 2015-04-17: 10 mg via INTRAVENOUS

## 2015-04-17 MED ORDER — PHENYLEPHRINE HCL 10 MG/ML IJ SOLN
INTRAMUSCULAR | Status: AC
  Filled 2015-04-17: qty 1

## 2015-04-17 MED ORDER — ALBUMIN HUMAN 5 % IV SOLN
INTRAVENOUS | Status: DC | PRN
  Administered 2015-04-17: 14:00:00 via INTRAVENOUS

## 2015-04-17 MED ORDER — PHENOL 1.4 % MT LIQD: 1.0000 | OROMUCOSAL | Status: DC | PRN

## 2015-04-17 MED ORDER — GLYCOPYRROLATE 0.2 MG/ML IJ SOLN
INTRAMUSCULAR | Status: AC
  Filled 2015-04-17: qty 3

## 2015-04-17 MED ORDER — HYDROCODONE-ACETAMINOPHEN 10-325 MG PO TABS
2.0000 | ORAL_TABLET | ORAL | Status: DC | PRN
  Administered 2015-04-17 – 2015-04-19 (×9): 2 via ORAL
  Filled 2015-04-17 (×9): qty 2

## 2015-04-17 MED ORDER — PHENYLEPHRINE HCL 10 MG/ML IJ SOLN
10.0000 mg | INTRAMUSCULAR | Status: DC | PRN
  Administered 2015-04-17: 20 ug/min via INTRAVENOUS

## 2015-04-17 MED ORDER — PROMETHAZINE HCL 25 MG/ML IJ SOLN: 6.2500 mg | INTRAMUSCULAR | Status: DC | PRN

## 2015-04-17 MED ORDER — BUPIVACAINE LIPOSOME 1.3 % IJ SUSP
20.0000 mL | Freq: Once | INTRAMUSCULAR | Status: DC
  Filled 2015-04-17: qty 20

## 2015-04-17 MED ORDER — ROCURONIUM BROMIDE 50 MG/5ML IV SOLN
INTRAVENOUS | Status: AC
  Filled 2015-04-17: qty 1

## 2015-04-17 MED ORDER — CEFAZOLIN SODIUM-DEXTROSE 2-3 GM-% IV SOLR
2.0000 g | Freq: Three times a day (TID) | INTRAVENOUS | Status: DC
  Administered 2015-04-17 – 2015-04-19 (×5): 2 g via INTRAVENOUS
  Filled 2015-04-17 (×6): qty 50

## 2015-04-17 MED ORDER — LIDOCAINE HCL (CARDIAC) 20 MG/ML IV SOLN
INTRAVENOUS | Status: DC | PRN
  Administered 2015-04-17: 100 mg via INTRAVENOUS

## 2015-04-17 MED ORDER — ACETAMINOPHEN 325 MG PO TABS: 650.0000 mg | ORAL_TABLET | ORAL | Status: DC | PRN

## 2015-04-17 MED ORDER — TERIFLUNOMIDE 14 MG PO TABS
14.0000 mg | ORAL_TABLET | Freq: Every day | ORAL | Status: DC
  Administered 2015-04-17 – 2015-04-18 (×2): 14 mg via ORAL

## 2015-04-17 MED ORDER — HYDROMORPHONE HCL 1 MG/ML IJ SOLN
INTRAMUSCULAR | Status: AC
  Administered 2015-04-17: 0.5 mg via INTRAVENOUS
  Filled 2015-04-17: qty 1

## 2015-04-17 MED ORDER — GLYCOPYRROLATE 0.2 MG/ML IJ SOLN
INTRAMUSCULAR | Status: AC
  Filled 2015-04-17: qty 1

## 2015-04-17 MED ORDER — FENTANYL CITRATE (PF) 250 MCG/5ML IJ SOLN
INTRAMUSCULAR | Status: AC
  Filled 2015-04-17: qty 5

## 2015-04-17 MED ORDER — ONDANSETRON HCL 4 MG/2ML IJ SOLN: 4.0000 mg | INTRAMUSCULAR | Status: DC | PRN

## 2015-04-17 MED ORDER — PHENYLEPHRINE 40 MCG/ML (10ML) SYRINGE FOR IV PUSH (FOR BLOOD PRESSURE SUPPORT)
PREFILLED_SYRINGE | INTRAVENOUS | Status: AC
  Filled 2015-04-17: qty 10

## 2015-04-17 MED ORDER — 0.9 % SODIUM CHLORIDE (POUR BTL) OPTIME
TOPICAL | Status: DC | PRN
  Administered 2015-04-17: 1000 mL

## 2015-04-17 MED ORDER — HYDROMORPHONE HCL 1 MG/ML IJ SOLN
0.2500 mg | INTRAMUSCULAR | Status: DC | PRN
  Administered 2015-04-17 (×4): 0.5 mg via INTRAVENOUS

## 2015-04-17 MED ORDER — CYCLOBENZAPRINE HCL 10 MG PO TABS
10.0000 mg | ORAL_TABLET | Freq: Three times a day (TID) | ORAL | Status: DC | PRN
  Administered 2015-04-19: 10 mg via ORAL
  Filled 2015-04-17: qty 1

## 2015-04-17 MED ORDER — MIDAZOLAM HCL 2 MG/2ML IJ SOLN
INTRAMUSCULAR | Status: AC
  Filled 2015-04-17: qty 4

## 2015-04-17 MED ORDER — EPHEDRINE SULFATE 50 MG/ML IJ SOLN
INTRAMUSCULAR | Status: AC
  Filled 2015-04-17: qty 1

## 2015-04-17 MED ORDER — MIDAZOLAM HCL 5 MG/5ML IJ SOLN
INTRAMUSCULAR | Status: DC | PRN
  Administered 2015-04-17 (×2): 1 mg via INTRAVENOUS

## 2015-04-17 MED ORDER — SODIUM CHLORIDE 0.9 % IV SOLN: 250.0000 mL | INTRAVENOUS | Status: DC

## 2015-04-17 MED ORDER — LACTATED RINGERS IV SOLN
INTRAVENOUS | Status: DC | PRN
  Administered 2015-04-17 (×3): via INTRAVENOUS

## 2015-04-17 MED ORDER — IBUPROFEN 200 MG PO TABS: 800.0000 mg | ORAL_TABLET | Freq: Three times a day (TID) | ORAL | Status: DC | PRN

## 2015-04-17 MED ORDER — ROCURONIUM BROMIDE 100 MG/10ML IV SOLN
INTRAVENOUS | Status: DC | PRN
  Administered 2015-04-17 (×2): 10 mg via INTRAVENOUS
  Administered 2015-04-17: 20 mg via INTRAVENOUS
  Administered 2015-04-17: 50 mg via INTRAVENOUS

## 2015-04-17 MED ORDER — SUCCINYLCHOLINE CHLORIDE 20 MG/ML IJ SOLN
INTRAMUSCULAR | Status: DC | PRN
  Administered 2015-04-17: 120 mg via INTRAVENOUS

## 2015-04-17 MED ORDER — AMPHETAMINE-DEXTROAMPHETAMINE 10 MG PO TABS
10.0000 mg | ORAL_TABLET | Freq: Two times a day (BID) | ORAL | Status: DC
  Administered 2015-04-18 – 2015-04-19 (×3): 10 mg via ORAL
  Filled 2015-04-17 (×3): qty 1

## 2015-04-17 MED ORDER — SUCCINYLCHOLINE CHLORIDE 20 MG/ML IJ SOLN
INTRAMUSCULAR | Status: AC
  Filled 2015-04-17: qty 1

## 2015-04-17 MED ORDER — LIDOCAINE-EPINEPHRINE 1 %-1:100000 IJ SOLN
INTRAMUSCULAR | Status: DC | PRN
  Administered 2015-04-17: 10 mL

## 2015-04-17 MED ORDER — SODIUM CHLORIDE 0.9 % IJ SOLN
3.0000 mL | Freq: Two times a day (BID) | INTRAMUSCULAR | Status: DC
  Administered 2015-04-17 – 2015-04-18 (×2): 3 mL via INTRAVENOUS

## 2015-04-17 MED ORDER — DEXAMETHASONE SODIUM PHOSPHATE 10 MG/ML IJ SOLN
INTRAMUSCULAR | Status: AC
  Filled 2015-04-17: qty 1

## 2015-04-17 MED ORDER — FENTANYL CITRATE (PF) 100 MCG/2ML IJ SOLN
INTRAMUSCULAR | Status: DC | PRN
  Administered 2015-04-17: 150 ug via INTRAVENOUS
  Administered 2015-04-17 (×2): 50 ug via INTRAVENOUS

## 2015-04-17 MED ORDER — TAMSULOSIN HCL 0.4 MG PO CAPS
0.4000 mg | ORAL_CAPSULE | Freq: Every day | ORAL | Status: DC
  Administered 2015-04-18: 0.4 mg via ORAL
  Filled 2015-04-17: qty 1

## 2015-04-17 MED ORDER — DULOXETINE HCL 60 MG PO CPEP
60.0000 mg | ORAL_CAPSULE | Freq: Two times a day (BID) | ORAL | Status: DC
  Administered 2015-04-17 – 2015-04-19 (×4): 60 mg via ORAL
  Filled 2015-04-17 (×4): qty 1

## 2015-04-17 MED ORDER — PROPOFOL 10 MG/ML IV BOLUS
INTRAVENOUS | Status: DC | PRN
  Administered 2015-04-17: 250 mg via INTRAVENOUS

## 2015-04-17 MED ORDER — HYDROMORPHONE HCL 1 MG/ML IJ SOLN
0.5000 mg | INTRAMUSCULAR | Status: DC | PRN
  Administered 2015-04-17 – 2015-04-18 (×7): 1 mg via INTRAVENOUS
  Filled 2015-04-17 (×7): qty 1

## 2015-04-17 MED ORDER — DOCUSATE SODIUM 100 MG PO CAPS
100.0000 mg | ORAL_CAPSULE | Freq: Two times a day (BID) | ORAL | Status: DC
  Administered 2015-04-17 – 2015-04-19 (×4): 100 mg via ORAL
  Filled 2015-04-17 (×4): qty 1

## 2015-04-17 MED ORDER — TIZANIDINE HCL 4 MG PO TABS
4.0000 mg | ORAL_TABLET | Freq: Four times a day (QID) | ORAL | Status: DC | PRN
  Filled 2015-04-17: qty 1

## 2015-04-17 MED ORDER — SODIUM CHLORIDE 0.9 % IJ SOLN: 3.0000 mL | INTRAMUSCULAR | Status: DC | PRN

## 2015-04-17 MED ORDER — EPHEDRINE SULFATE 50 MG/ML IJ SOLN
INTRAMUSCULAR | Status: DC | PRN
  Administered 2015-04-17: 10 mg via INTRAVENOUS
  Administered 2015-04-17: 5 mg via INTRAVENOUS
  Administered 2015-04-17: 10 mg via INTRAVENOUS

## 2015-04-17 MED ORDER — ESMOLOL HCL 10 MG/ML IV SOLN
INTRAVENOUS | Status: AC
  Filled 2015-04-17: qty 10

## 2015-04-17 MED ORDER — VANCOMYCIN HCL 1000 MG IV SOLR
INTRAVENOUS | Status: AC
  Filled 2015-04-17: qty 1000

## 2015-04-17 MED ORDER — ONDANSETRON HCL 4 MG/2ML IJ SOLN
INTRAMUSCULAR | Status: DC | PRN
  Administered 2015-04-17: 4 mg via INTRAVENOUS

## 2015-04-17 MED ORDER — LIDOCAINE HCL (CARDIAC) 20 MG/ML IV SOLN
INTRAVENOUS | Status: AC
  Filled 2015-04-17: qty 5

## 2015-04-17 SURGICAL SUPPLY — 77 items
BAG DECANTER FOR FLEXI CONT (MISCELLANEOUS) ×2 IMPLANT
BENZOIN TINCTURE PRP APPL 2/3 (GAUZE/BANDAGES/DRESSINGS) ×2 IMPLANT
BLADE CLIPPER SURG (BLADE) IMPLANT
BLADE SURG 11 STRL SS (BLADE) ×2 IMPLANT
BONE ALLOSTEM MORSELIZED 5CC (Bone Implant) ×2 IMPLANT
BRUSH SCRUB EZ PLAIN DRY (MISCELLANEOUS) ×2 IMPLANT
BUR MATCHSTICK NEURO 3.0 LAGG (BURR) ×2 IMPLANT
BUR PRECISION FLUTE 6.0 (BURR) ×2 IMPLANT
CANISTER SUCT 3000ML PPV (MISCELLANEOUS) ×2 IMPLANT
CONT SPEC 4OZ CLIKSEAL STRL BL (MISCELLANEOUS) ×4 IMPLANT
COVER BACK TABLE 24X17X13 BIG (DRAPES) IMPLANT
COVER BACK TABLE 60X90IN (DRAPES) ×2 IMPLANT
CROSSLINK DANEK (Orthopedic Implant) ×1 IMPLANT
DECANTER SPIKE VIAL GLASS SM (MISCELLANEOUS) ×2 IMPLANT
DRAPE C-ARM 42X72 X-RAY (DRAPES) ×2 IMPLANT
DRAPE C-ARMOR (DRAPES) ×2 IMPLANT
DRAPE LAPAROTOMY 100X72X124 (DRAPES) ×2 IMPLANT
DRAPE POUCH INSTRU U-SHP 10X18 (DRAPES) ×2 IMPLANT
DRAPE PROXIMA HALF (DRAPES) IMPLANT
DRAPE SURG 17X23 STRL (DRAPES) ×2 IMPLANT
DRSG OPSITE 4X5.5 SM (GAUZE/BANDAGES/DRESSINGS) ×2 IMPLANT
DRSG OPSITE POSTOP 4X10 (GAUZE/BANDAGES/DRESSINGS) ×2 IMPLANT
DURAPREP 26ML APPLICATOR (WOUND CARE) ×2 IMPLANT
ELECT BLADE 4.0 EZ CLEAN MEGAD (MISCELLANEOUS) ×2
ELECT REM PT RETURN 9FT ADLT (ELECTROSURGICAL) ×2
ELECTRODE BLDE 4.0 EZ CLN MEGD (MISCELLANEOUS) ×1 IMPLANT
ELECTRODE REM PT RTRN 9FT ADLT (ELECTROSURGICAL) ×1 IMPLANT
EVACUATOR 3/16  PVC DRAIN (DRAIN) ×1
EVACUATOR 3/16 PVC DRAIN (DRAIN) ×1 IMPLANT
GAUZE SPONGE 4X4 12PLY STRL (GAUZE/BANDAGES/DRESSINGS) ×2 IMPLANT
GAUZE SPONGE 4X4 16PLY XRAY LF (GAUZE/BANDAGES/DRESSINGS) ×4 IMPLANT
GLOVE BIO SURGEON STRL SZ8 (GLOVE) ×4 IMPLANT
GLOVE ECLIPSE 7.5 STRL STRAW (GLOVE) ×4 IMPLANT
GLOVE EXAM NITRILE LRG STRL (GLOVE) IMPLANT
GLOVE EXAM NITRILE MD LF STRL (GLOVE) IMPLANT
GLOVE EXAM NITRILE XL STR (GLOVE) IMPLANT
GLOVE EXAM NITRILE XS STR PU (GLOVE) IMPLANT
GLOVE INDICATOR 7.5 STRL GRN (GLOVE) ×4 IMPLANT
GLOVE INDICATOR 8.0 STRL GRN (GLOVE) ×2 IMPLANT
GLOVE INDICATOR 8.5 STRL (GLOVE) ×4 IMPLANT
GLOVE SS N UNI LF 7.0 STRL (GLOVE) ×2 IMPLANT
GLOVE SURG SS PI 7.0 STRL IVOR (GLOVE) ×8 IMPLANT
GOWN STRL REUS W/ TWL LRG LVL3 (GOWN DISPOSABLE) ×1 IMPLANT
GOWN STRL REUS W/ TWL XL LVL3 (GOWN DISPOSABLE) ×3 IMPLANT
GOWN STRL REUS W/TWL 2XL LVL3 (GOWN DISPOSABLE) IMPLANT
GOWN STRL REUS W/TWL LRG LVL3 (GOWN DISPOSABLE) ×1
GOWN STRL REUS W/TWL XL LVL3 (GOWN DISPOSABLE) ×3
KIT BASIN OR (CUSTOM PROCEDURE TRAY) ×2 IMPLANT
KIT INFUSE XX SMALL 0.7CC (Orthopedic Implant) ×2 IMPLANT
KIT ROOM TURNOVER OR (KITS) ×2 IMPLANT
LIQUID BAND (GAUZE/BANDAGES/DRESSINGS) ×2 IMPLANT
MILL MEDIUM DISP (BLADE) ×2 IMPLANT
NEEDLE HYPO 21X1.5 SAFETY (NEEDLE) ×2 IMPLANT
NEEDLE HYPO 25X1 1.5 SAFETY (NEEDLE) ×2 IMPLANT
NS IRRIG 1000ML POUR BTL (IV SOLUTION) ×2 IMPLANT
PACK LAMINECTOMY NEURO (CUSTOM PROCEDURE TRAY) ×2 IMPLANT
PAD ARMBOARD 7.5X6 YLW CONV (MISCELLANEOUS) ×6 IMPLANT
PLATE BN 1.75-2.15XLO BAR (Orthopedic Implant) ×1 IMPLANT
ROD PREBENT 6.35X60 (Rod) ×4 IMPLANT
SCREW DANEK NONBREAK (Screw) ×4 IMPLANT
SCREW PEDICLE VA L635 6.5X50M (Screw) ×4 IMPLANT
SCREW SET BREAK OFF (Screw) ×4 IMPLANT
SPACER RISE 8X22 8-14MM-10 (Neuro Prosthesis/Implant) ×4 IMPLANT
SPONGE LAP 4X18 X RAY DECT (DISPOSABLE) IMPLANT
SPONGE SURGIFOAM ABS GEL 100 (HEMOSTASIS) ×2 IMPLANT
STRIP CLOSURE SKIN 1/2X4 (GAUZE/BANDAGES/DRESSINGS) ×2 IMPLANT
SUT BONE WAX W31G (SUTURE) ×2 IMPLANT
SUT VIC AB 0 CT1 18XCR BRD8 (SUTURE) ×2 IMPLANT
SUT VIC AB 0 CT1 8-18 (SUTURE) ×2
SUT VIC AB 2-0 CT1 18 (SUTURE) ×2 IMPLANT
SUT VICRYL 4-0 PS2 18IN ABS (SUTURE) ×2 IMPLANT
SYR 20CC LL (SYRINGE) ×2 IMPLANT
TOWEL OR 17X24 6PK STRL BLUE (TOWEL DISPOSABLE) ×2 IMPLANT
TOWEL OR 17X26 10 PK STRL BLUE (TOWEL DISPOSABLE) ×2 IMPLANT
TRAY FOLEY CATH 16FRSI W/METER (SET/KITS/TRAYS/PACK) ×2 IMPLANT
TRAY FOLEY W/METER SILVER 14FR (SET/KITS/TRAYS/PACK) IMPLANT
WATER STERILE IRR 1000ML POUR (IV SOLUTION) ×2 IMPLANT

## 2015-04-17 NOTE — H&P (Signed)
Mark [name] is an 54 y.o. male.   Chief Complaint: Back and bilateral leg pain HPI: Patient is a 54 year old gentleman has had multiple issues with his cervical thoracic lumbar spine multiple surgeries on both all 3 sections. He previously undergone an L2-S1 fusion over 2 different operations and was doing fairly well with his lumbar spine until he started having acute back pain workup revealed a new herniated disc at L1-L2 causing severe thecal sac compression. Patient does have bilateral hip and leg pain groin pain and medial 5 pain. Due to patient's failure conservative treatment imaging findings and progression of clinical syndrome I recommended decompression stabilization procedure at L1-2 and exploration of fusion removal of hardware L2-L4. I have extensively reviewed the risks and benefits of the operation with the patient as well as perioperative course expectations of outcome and alternatives of surgery and he understands and agrees to proceed forward.  Past Medical History  Diagnosis Date  . Morbid obesity   . Dysphonia     thoracic left sided"impingement" with heminumbness  . Chronic pain   . Depression   . Anxiety     chronic pain treated with narcotics  . MS (multiple sclerosis) 02/11/2013    [name]  . MS (multiple sclerosis)   . Sleep apnea with use of continuous positive airway pressure (CPAP) 02/11/2013    07-13-12 AHi of 98.6 /hr titrated to 11 cm water ,  average user time 4 hours and 4 minutes. Residual AHI 1.20 December 2012 .  Marland Kitchen Pneumonia   . Constipation due to opioid therapy   . GI bleed     abt 15 years ago  . Umbilical hernia   . Neuromuscular disorder   . Arthritis     Past Surgical History  Procedure Laterality Date  . Carpal tunnel release Left     [name]  . Spine surgery      total of six vertebras fused, three lumbar surgeries and one anterior neck fusion  . Tonsillectomy    . Lower back fusion      surgery  . Colonoscopy w/  polypectomy      Family History  Problem Relation Age of Onset  . Sleep walking Son   . Cancer Other   . Cancer Other    Social History:  reports that he has never smoked. He has never used smokeless tobacco. He reports that he drinks alcohol. He reports that he does not use illicit drugs.  Allergies: No Known Allergies  Medications Prior to Admission  Medication Sig Dispense Refill  . amphetamine-dextroamphetamine (ADDERALL) 10 MG tablet Take 10 mg by mouth 2 (two) times daily with a meal.    . DULoxetine (CYMBALTA) 60 MG capsule Take 60 mg by mouth 2 (two) times daily.     Marland Kitchen gabapentin (NEURONTIN) 600 MG tablet Take 600 mg by mouth 4 (four) times daily.    Marland Kitchen HYDROcodone-acetaminophen (NORCO) 10-325 MG per tablet Take 2 tablets by mouth every 4 (four) hours as needed for severe pain.     Marland Kitchen ibuprofen (ADVIL,MOTRIN) 800 MG tablet Take 800 mg by mouth every 8 (eight) hours as needed for moderate pain.    . tamsulosin (FLOMAX) 0.4 MG CAPS capsule Take 0.4 mg by mouth daily.    . Teriflunomide (AUBAGIO) 14 MG TABS Take 14 mg by mouth at bedtime. Dr Rae Mar    . tiZANidine (ZANAFLEX) 4 MG tablet Take 4 mg by mouth every 6 (six) hours as  needed for muscle spasms.       No results found for this or any previous visit (from the past 48 hour(s)). No results found.  Review of Systems  Constitutional: Negative.   Eyes: Negative.   Respiratory: Negative.   Cardiovascular: Negative.   Gastrointestinal: Negative.   Genitourinary: Negative.   Musculoskeletal: Positive for myalgias, back pain, joint pain and neck pain.  Skin: Negative.   Neurological: Positive for tingling and sensory change.  Endo/Heme/Allergies: Negative.   Psychiatric/Behavioral: Negative.     Blood pressure 130/72, pulse 65, temperature 97.6 F (36.4 C), temperature source Oral, resp. rate 20, height 6\' 1"  (1.854 m), weight 158.561 kg (349 lb 9 oz), SpO2 96 %. Physical Exam  Constitutional: He is oriented to  person, place, and time. He appears well-developed and well-nourished.  HENT:  Head: Normocephalic.  Eyes: Pupils are equal, round, and reactive to light.  Neck: Normal range of motion.  Respiratory: Effort normal.  GI: Soft.  Neurological: He is alert and oriented to person, place, and time. He has normal strength. GCS eye subscore is 4. GCS verbal subscore is 5. GCS motor subscore is 6.  Patient is awake and alert strength is 5 out of 5 in his upper and lower extremities.  Skin: Skin is warm and dry.     Assessment/Plan 54 year old gentleman presents for decompression stabilization procedure at L1-L2 with an expiration of fusion removal of hardware L2-L4  CRAM,GARY P 04/17/2015, 10:04 AM

## 2015-04-17 NOTE — Anesthesia Postprocedure Evaluation (Signed)
  Anesthesia Post-op Note  Patient: Percell Miller A Kasik  Procedure(s) Performed: Procedure(s): LUMBAR ONE-TWO POSTERIOR LUMBAR INTERBODY FUSION  WITH EXPLORATION  HARDWARE REMOVAL LUMBAR TWO TO LUMBAR FOUR (N/A)  Patient Location: PACU  Anesthesia Type:General  Level of Consciousness: awake, sedated, patient cooperative and responds to stimulation  Airway and Oxygen Therapy: Patient Spontanous Breathing and Patient connected to nasal cannula oxygen  Post-op Pain: moderate  Post-op Assessment: Post-op Vital signs reviewed LLE Motor Response: Purposeful movement LLE Sensation: Full sensation RLE Motor Response: Purposeful movement RLE Sensation: Full sensation      Post-op Vital Signs: stable  Last Vitals:  Filed Vitals:   04/17/15 1530  BP: 168/94  Pulse: 88  Temp: 36.8 C  Resp: 20    Complications: No apparent anesthesia complications

## 2015-04-17 NOTE — Anesthesia Procedure Notes (Signed)
Procedure Name: Intubation Date/Time: 04/17/2015 10:13 AM Performed by: Susa Loffler Pre-anesthesia Checklist: Patient identified, Patient being monitored, Emergency Drugs available, Timeout performed and Suction available Patient Re-evaluated:Patient Re-evaluated prior to inductionOxygen Delivery Method: Circle system utilized Preoxygenation: Pre-oxygenation with 100% oxygen Intubation Type: IV induction Ventilation: Mask ventilation without difficulty, Two handed mask ventilation required and Oral airway inserted - appropriate to patient size Laryngoscope Size: Glidescope (large adult blade) Grade View: Grade I Tube type: Oral Tube size: 7.5 mm Number of attempts: 1 Airway Equipment and Method: Stylet,  Video-laryngoscopy and Oral airway Placement Confirmation: ETT inserted through vocal cords under direct vision,  positive ETCO2 and breath sounds checked- equal and bilateral Secured at: 23 cm Tube secured with: Tape Dental Injury: Teeth and Oropharynx as per pre-operative assessment

## 2015-04-17 NOTE — Progress Notes (Signed)
Dr. Orene Desanctis updated. Pt pain 8/10 but drifting off to sleep. Snoring occassionally Saturations good. BP 177/ 97 pt states bp runs 140-170's at home at times. Cleared to transfer to 5 C

## 2015-04-17 NOTE — Progress Notes (Signed)
Placed pt. On cpap. Pt. Tolerating well at this time. 

## 2015-04-17 NOTE — Progress Notes (Signed)
Attempted IV x 2-would not thread

## 2015-04-17 NOTE — Transfer of Care (Signed)
Immediate Anesthesia Transfer of Care Note  Patient: Mark [name]  Procedure(s) Performed: Procedure(s): LUMBAR ONE-TWO POSTERIOR LUMBAR INTERBODY FUSION  WITH EXPLORATION  HARDWARE REMOVAL LUMBAR TWO TO LUMBAR FOUR (N/A)  Patient Location: PACU  Anesthesia Type:General  Level of Consciousness: awake, alert  and oriented  Airway & Oxygen Therapy: Patient Spontanous Breathing and Patient connected to nasal cannula oxygen  Post-op Assessment: Report given to RN, Post -op Vital signs reviewed and stable and Patient moving all extremities X 4  Post vital signs: Reviewed and stable  Last Vitals:  Filed Vitals:   04/17/15 0839  BP: 130/72  Pulse: 65  Temp: 36.4 C  Resp: 20    Complications: No apparent anesthesia complications

## 2015-04-17 NOTE — Op Note (Signed)
Preoperative diagnosis: Herniated nuclear pulposus L1-2 and instability L1-L2.  Postoperative diagnosis: Same  Procedure: Decompressive lumbar laminectomy L1-L2 in excess and requiring more work to would be needed with a standard interbody fusion with complete medial facetectomies and radical foraminotomies of the L1-L2 nerve roots.  #2 posterior lumbar interbody fusion using the globus expandable peek cages packed with locally harvested autograft mixed with allostem and BMP L1-L2  #3 pedicle screw fixation L1-L2 using the 6.35 Legacy pedicle screw system from [employer]  #4 exploration of fusion removal of hardware L2-L4  #5 posterior lateral arthrodesis L1-L2 using the locally harvested autograft mixed with Allostem and BMP  Surgeon: Dominica Severin cram  Asst.: Marland Kitchen Ditty  Anesthesia: Gen.  EBL: Minimal  History of present illness: Patient is a 54 year old gentleman has had long-standing issues with his cervical lumbar and thoracic spine undergone previous L2-S1 fusion and was doing fairly well however had progressive worsening back and bilateral leg pain workup revealed a large herniated disc at L1-L2 and due to patient's failed conservative treatment imaging findings and progressive clinical syndrome I recommended decompression sterilization procedure at that level. I extensively went over the risks and benefits of the operation with the patient as well as perioperative course expectations of outcome and alternatives of surgery he understood and agreed to proceed forward.  Operative procedure: Patient brought into the or was induced under general anesthesia positioned prone the Wilson frame his back was prepped and draped in routine sterile fashion. His old incision was opened up and extended cephalad subperiosteal dissections care lamina of L1-L2 and the hardware was exposed and opened down down to L4. I inspected the fusion effusion did appear to be solid I removed the cross-link remove the  top tightening nuts and removed the L3 and L4 screws. The fusion again was very solid from L2-L4. This point attention was taken to expose the TPs at L1 and removed the spinous process at L2 performed central decompression work into scar tissue with complete medial facetectomies radical foraminotomies of the L1 and L2 nerve roots. Epidural veins were correct I there was a large free fragment disc herniation migrated inferiorly from the disc space displacing the right L2 nerve against the pedicle at teased several large fibrous disc from medial to the pedicle cleaned out the disc space with pituitary rongeurs Epstein curettes and paddle shavers. With the distractor in place on the left side I inserted a 8-14 expandable cage packed with locally harvested autograft then the contralateral side endplates were prepared in a similar fashion some additional disc had ruptured on the left side and removed this cleaned out the disc space prepared the endplates packed a BMP local autograft mixed centrally and laterally and inserted the contralateral cage expanded to similar fashion. Then placed to L1 pedicle screws in similar fashion with Palacos cannulation with the awl probed O55 Probed again and 6 5 x 50 screws inserted bilaterally at L1. All screws excellent purchase was a couple see her good fixing space was maintained aggressive decortication was care MTPs lateral gutters from L1-L2 the remainder the local autograft BMP mix was packed posterior laterally from the TPs of L1 into the posterior lateral fusion mass at L2. Then I connected up 2 rods anchored and top tightening nuts in place placed a cross-link checked the foramina to confirm patency overlaid Gelfoam and up the dura spray spread vancomycin powder on the wound. Then placed a large Hemovac drain closed in layers interrupted Vicryls place more vancomycin powder in the extra  fascial layer. Then closed the skin with subcuticular. Dermabond benzo and Steri-Strip were  all applied patient recovered in stable condition. At the end of case all needle counts sponge counts were correct.

## 2015-04-17 NOTE — Anesthesia Preprocedure Evaluation (Addendum)
Anesthesia Evaluation  Patient identified by MRN, date of birth, ID band Patient awake    Reviewed: Allergy & Precautions, NPO status , Patient's Chart, lab work & pertinent test results  History of Anesthesia Complications Negative for: history of anesthetic complications  Airway Mallampati: III  TM Distance: <3 FB Neck ROM: Full    Dental  (+) Teeth Intact, Dental Advisory Given, Poor Dentition Multiple carious teeth:   Pulmonary sleep apnea , pneumonia,    breath sounds clear to auscultation       Cardiovascular + Peripheral Vascular Disease   Rhythm:Regular Rate:Normal     Neuro/Psych Multiple sclerosis  Neuromuscular disease    GI/Hepatic negative GI ROS, Neg liver ROS,   Endo/Other  Morbid obesity  Renal/GU negative Renal ROS     Musculoskeletal  (+) Arthritis ,   Abdominal (+) + obese,   Peds  Hematology   Anesthesia Other Findings   Reproductive/Obstetrics                           Anesthesia Physical Anesthesia Plan  ASA: III  Anesthesia Plan: General   Post-op Pain Management:    Induction: Intravenous  Airway Management Planned: Oral ETT and Video Laryngoscope Planned  Additional Equipment:   Intra-op Plan:   Post-operative Plan: Extubation in OR  Informed Consent: I have reviewed the patients History and Physical, chart, labs and discussed the procedure including the risks, benefits and alternatives for the proposed anesthesia with the patient or authorized representative who has indicated his/her understanding and acceptance.   Dental advisory given  Plan Discussed with: CRNA and Surgeon  Anesthesia Plan Comments:         Anesthesia Quick Evaluation

## 2015-04-17 NOTE — Progress Notes (Signed)
..  Pt arrived to 5C08@ 1634, Pt A&Ox 4, c/o pain 0/10. Dressing CDI, hem. Vac.  Marland Kitchen Pt VS taken, pt on O2 from PACU. Foley intact, unclamped. Pt without distress. Family at the bedside. Diet ordered, will monitor.

## 2015-04-18 NOTE — Progress Notes (Signed)
Subjective: Patient reports Doing well no leg pain back pain controlled  Objective: Vital signs in last 24 hours: Temp:  [97.6 F (36.4 C)-98.4 F (36.9 C)] 98.3 F (36.8 C) (09/27 0533) Pulse Rate:  [65-92] 89 (09/27 0533) Resp:  [8-21] 18 (09/27 0533) BP: (130-183)/(72-110) 173/79 mmHg (09/27 0533) SpO2:  [94 %-100 %] 100 % (09/27 0533) Weight:  [158.561 kg (349 lb 9 oz)] 158.561 kg (349 lb 9 oz) (09/26 0839)  Intake/Output from previous day: 09/26 0701 - 09/27 0700 In: 2650 [I.V.:2400; IV Piggyback:250] Out: 4215 [Urine:3515; Drains:500; Blood:200] Intake/Output this shift:    Strength out of 5 wound clean dry and intact  Lab Results: No results for input(s): WBC, HGB, HCT, PLT in the last 72 hours. BMET No results for input(s): NA, K, CL, CO2, GLUCOSE, BUN, CREATININE, CALCIUM in the last 72 hours.  Studies/Results: Dg Lumbar Spine 2-3 Views  04/17/2015   CLINICAL DATA:  L1-2 posterior fusion and L2 through L5 hardware removal.  EXAM: DG C-ARM 61-120 MIN; LUMBAR SPINE - 2-3 VIEW  COMPARISON:  Lumbar spine CT dated 06/28/2008.  FINDINGS: AP an lateral C arm views of the lumbar spine demonstrate placement of interbody metallic spacers and pedicle screws at 1 level in the lumbar spine. The exact level cannot be determined due to the limited field-of-view. An interbody bone plug is noted at the next more inferior level.  IMPRESSION: Surgical changes, as described above.   Electronically Signed   By: Claudie Revering M.D.   On: 04/17/2015 14:01   Dg C-arm 1-60 Min  04/17/2015   CLINICAL DATA:  L1-2 posterior fusion and L2 through L5 hardware removal.  EXAM: DG C-ARM 61-120 MIN; LUMBAR SPINE - 2-3 VIEW  COMPARISON:  Lumbar spine CT dated 06/28/2008.  FINDINGS: AP an lateral C arm views of the lumbar spine demonstrate placement of interbody metallic spacers and pedicle screws at 1 level in the lumbar spine. The exact level cannot be determined due to the limited field-of-view. An interbody  bone plug is noted at the next more inferior level.  IMPRESSION: Surgical changes, as described above.   Electronically Signed   By: Claudie Revering M.D.   On: 04/17/2015 14:01    Assessment/Plan: Postoperative day 1 from a PLIF at L1-L2 mobilized today with physical and occupational therapy  LOS: 1 day     CRAM,GARY P 04/18/2015, 7:39 AM

## 2015-04-18 NOTE — Care Management Note (Signed)
Case Management Note  Patient Details  Name: Mark [name] MRN: [id number] Date of Birth: Jan 23, 1961  Subjective/Objective:                    Action/Plan: Patient admitted for L 1-2 PLIF. Pt from home. CM will continue to follow for discharge needs.   Expected Discharge Date:                  Expected Discharge Plan:  Home/Self Care  In-House Referral:     Discharge planning Services     Post Acute Care Choice:    Choice offered to:     DME Arranged:    DME Agency:     HH Arranged:    HH Agency:     Status of Service:  In process, will continue to follow  Medicare Important Message Given:    Date Medicare IM Given:    Medicare IM give by:    Date Additional Medicare IM Given:    Additional Medicare Important Message give by:     If discussed at [place] of Stay Meetings, dates discussed:    Additional Comments:  Pollie Friar, RN 04/18/2015, 10:18 AM

## 2015-04-18 NOTE — Evaluation (Signed)
Physical Therapy Evaluation Patient Details Name: Mark [name] MRN: [id number] DOB: 05-22-1961 Today's Date: 04/18/2015   History of Present Illness  pt presents with L1-2 PLIF and hx of MS and multiple back surgeries.    Clinical Impression  Pt moving well and follows cueing well.  Pt able to self-don his back brace without A, but needed education on back precautions.  Will continue to follow while on acute.      Follow Up Recommendations No PT follow up;Supervision - Intermittent    Equipment Recommendations  None recommended by PT    Recommendations for Other Services       Precautions / Restrictions Precautions Precautions: Back Precaution Booklet Issued: No Precaution Comments: Reviewed back precautions.   Required Braces or Orthoses: Spinal Brace Spinal Brace: Lumbar corset;Applied in sitting position Restrictions Weight Bearing Restrictions: No      Mobility  Bed Mobility Overal bed mobility: Needs Assistance Bed Mobility: Rolling;Sidelying to Sit Rolling: Min guard Sidelying to sit: Min guard       General bed mobility comments: cues for log roll technique and hand over hand cueing.    Transfers Overall transfer level: Needs assistance Equipment used: Rolling walker (2 wheeled) Transfers: Sit to/from Stand Sit to Stand: Min guard         General transfer comment: cues for UE use and scooting to EOB prior to coming to stand.    Ambulation/Gait Ambulation/Gait assistance: Supervision Ambulation Distance (Feet): 100 Feet Assistive device: Rolling walker (2 wheeled) Gait Pattern/deviations: Step-through pattern;Decreased stride length     General Gait Details: pt moves with guarded posture and needs cues for positioning within RW and upright posture.    Stairs            Wheelchair Mobility    Modified Rankin (Stroke Patients Only)       Balance Overall balance assessment: No apparent balance deficits (not formally assessed)                                            Pertinent Vitals/Pain Pain Assessment: 0-10 Pain Score: 3  Pain Location: Back Pain Descriptors / Indicators: Aching Pain Intervention(s): Monitored during session;Premedicated before session;Repositioned    Home Living Family/patient expects to be discharged to:: Private residence Living Arrangements: Spouse/significant other Available Help at Discharge: Family;Available 24 hours/day Type of Home: House Home Access: Level entry     Home Layout: One level Home Equipment: Walker - 2 wheels;Shower seat;Cane - single point Additional Comments: pt has 2 steps down to den    Prior Function Level of Independence: Independent with assistive device(s)         Comments: Used cane.     Hand Dominance        Extremity/Trunk Assessment   Upper Extremity Assessment: Defer to OT evaluation           Lower Extremity Assessment: RLE deficits/detail;LLE deficits/detail RLE Deficits / Details: pt indicates decreased sensation in LE up to knee.  Good overall strength.   LLE Deficits / Details: pt indicates chronic diminished sensation from nipple line inferiorly to toes since previous back surgery.    Cervical / Trunk Assessment: Normal  Communication   Communication: No difficulties  Cognition Arousal/Alertness: Awake/alert Behavior During Therapy: WFL for tasks assessed/performed Overall Cognitive Status: Within Functional Limits for tasks assessed  General Comments      Exercises        Assessment/Plan    PT Assessment Patient needs continued PT services  PT Diagnosis Difficulty walking   PT Problem List Decreased activity tolerance;Decreased balance;Decreased mobility;Decreased knowledge of use of DME;Decreased knowledge of precautions;Impaired sensation;Pain  PT Treatment Interventions DME instruction;Gait training;Stair training;Functional mobility training;Therapeutic  activities;Therapeutic exercise;Balance training;Neuromuscular re-education;Patient/family education   PT Goals (Current goals can be found in the Care Plan section) Acute Rehab PT Goals Patient Stated Goal: Move without pain. PT Goal Formulation: With patient Time For Goal Achievement: 04/25/15 Potential to Achieve Goals: Good    Frequency Min 5X/week   Barriers to discharge        Co-evaluation               End of Session Equipment Utilized During Treatment: Gait belt;Back brace Activity Tolerance: Patient tolerated treatment well Patient left: in chair;with call bell/phone within reach;with family/visitor present Nurse Communication: Mobility status         Time: 2297-9892 PT Time Calculation (min) (ACUTE ONLY): 24 min   Charges:   PT Evaluation $Initial PT Evaluation Tier I: 1 Procedure PT Treatments $Gait Training: 8-22 mins   PT G CodesCatarina Hartshorn, [place] 04/18/2015, 12:04 PM

## 2015-04-18 NOTE — Evaluation (Signed)
Occupational Therapy Evaluation Patient Details Name: Mark [name] MRN: [id number] DOB: [birth year] Today's Date: 04/18/2015    History of Present Illness pt presents with L1-2 PLIF and hx of MS and multiple back surgeries.     Clinical Impression   Pt was assisted for LB dressing by his wife and ambulating with a cane.  Educated pt and wife in back precautions related in ADL and IADL, availability of AE for LB bathing and dressing, and safe footwear.  Pt is well familiar with back precautions as this is his sixth spinal surgery.  No further OT needs.    Follow Up Recommendations  No OT follow up    Equipment Recommendations  None recommended by OT    Recommendations for Other Services       Precautions / Restrictions Precautions Precautions: Back Precaution Booklet Issued: No Precaution Comments: Reviewed back precautions.   Required Braces or Orthoses: Spinal Brace Spinal Brace: Lumbar corset;Applied in sitting position Restrictions Weight Bearing Restrictions: No      Mobility Bed Mobility Overal bed mobility: Needs Assistance Bed Mobility: Rolling;Sidelying to Sit Rolling: Min guard Sidelying to sit: Min guard       General bed mobility comments: pt in chair  Transfers Overall transfer level: Needs assistance Equipment used: Rolling walker (2 wheeled) Transfers: Sit to/from Stand Sit to Stand: Supervision         General transfer comment: wife reports they have been walking together in the halls    Balance Overall balance assessment: No apparent balance deficits (not formally assessed)                                          ADL                                         General ADL Comments: Pt performing ADL transfers at a supervision level. Wife will continue to assist with LB dressing, pt has a Secondary school teacher. Educated pt in 2 cup method for brushing teeth and to avoid twisting with pericare.     Vision      Perception     Praxis      Pertinent Vitals/Pain Pain Assessment: Faces Pain Score: 3  Faces Pain Scale: Hurts little more Pain Location: back Pain Descriptors / Indicators: Aching Pain Intervention(s): Monitored during session;Premedicated before session     Hand Dominance Right   Extremity/Trunk Assessment Upper Extremity Assessment Upper Extremity Assessment: Overall WFL for tasks assessed   Lower Extremity Assessment Lower Extremity Assessment: Defer to PT evaluation RLE Deficits / Details: pt indicates decreased sensation in LE up to knee.  Good overall strength.   RLE Sensation: decreased light touch LLE Deficits / Details: pt indicates chronic diminished sensation from nipple line inferiorly to toes since previous back surgery.   LLE Sensation: decreased light touch   Cervical / Trunk Assessment Cervical / Trunk Assessment: Normal   Communication Communication Communication: HOH   Cognition Arousal/Alertness: Awake/alert Behavior During Therapy: WFL for tasks assessed/performed Overall Cognitive Status: Within Functional Limits for tasks assessed                     General Comments       Exercises       Shoulder Instructions  Home Living Family/patient expects to be discharged to:: Private residence Living Arrangements: Spouse/significant other Available Help at Discharge: Family;Available 24 hours/day Type of Home: House Home Access: Level entry     Home Layout: One level     Bathroom Shower/Tub: Occupational psychologist: Handicapped height     Home Equipment: Environmental consultant - 2 wheels;Shower seat;Cane - single point;Adaptive equipment Adaptive Equipment: Reacher Additional Comments: pt has 2 steps down to den      Prior Functioning/Environment Level of Independence: Needs assistance  Gait / Transfers Assistance Needed: uses a cane ADL's / Homemaking Assistance Needed: wife helps don socks, pt wears slip on shoes    Comments: pt is familiar with use of sock aide, educated in benefits of long bath sponge and in safe footwear    OT Diagnosis:     OT Problem List:     OT Treatment/Interventions:      OT Goals(Current goals can be found in the care plan section) Acute Rehab OT Goals Patient Stated Goal: home today  OT Frequency:     Barriers to D/C:            Co-evaluation              End of Session Equipment Utilized During Treatment: Rolling walker;Back brace  Activity Tolerance: Patient tolerated treatment well Patient left: in chair;with call bell/phone within reach;with family/visitor present   Time: 1235-1250 OT Time Calculation (min): 15 min Charges:  OT General Charges $OT Visit: 1 Procedure OT Evaluation $Initial OT Evaluation Tier I: 1 Procedure G-Codes:    Malka So 04/18/2015, 1:06 PM

## 2015-04-18 NOTE — Progress Notes (Signed)
Patient refusing to have Foley removed this AM because of difficulty in the past with retention. Patient requesting to "keep it in a little bit longer." Will pass along to day shift. Continuing to monitor. Genelle Economou, Rande Brunt, RN

## 2015-04-19 MED ORDER — OXYCODONE HCL 10 MG PO TABS: 20.0000 mg | ORAL_TABLET | ORAL | Status: DC | PRN

## 2015-04-19 MED FILL — Sodium Chloride IV Soln 0.9%: INTRAVENOUS | Qty: 1000 | Status: AC

## 2015-04-19 MED FILL — Heparin Sodium (Porcine) Inj 1000 Unit/ML: INTRAMUSCULAR | Qty: 30 | Status: AC

## 2015-04-19 NOTE — Discharge Instructions (Signed)
No lifting no bending no twisting no driving a riding a car unless he is coming back and forth to see me. Keep incision clean dry and intact. °

## 2015-04-19 NOTE — Progress Notes (Signed)
Pt discharged home with spouse. Left unit via wheelchair with volunteer services. IV removed, hemovac discontinued. Pt tol well. Mark [name]

## 2015-04-19 NOTE — Discharge Summary (Signed)
Physician Discharge Summary  Patient ID: Mark [name] MRN: [id number] DOB/AGE: April 27, 1961 54 y.o.  Admit date: 04/17/2015 Discharge date: 04/19/2015  Admission Diagnoses: Degenerative disc disease lumbar spinal stenosis and herniated nucleus pulposis L1-L2  Discharge Diagnoses: Same good Active Problems:   HNP (herniated nucleus pulposus), lumbar   Discharged Condition: good  Hospital Course: patient is admitted hospital underwent decompressive laminectomy and fusion L1-L2 with exploration of fusion remohardware L2-L4., Postoperatively patient went to recovery room and the floor on the floor he was ambulating and voiding spontaneously and tolerating regular diet stable for discharge home.   Consults: None  Significant Diagnostic Studies:   Treatments: Decompression stabilization procedure L1-L2   Discharge Exam: Blood pressure 168/88, pulse 79, temperature 98.6 F (37 C), temperature source Oral, resp. rate 17, height 6\' 1"  (1.854 m), weight 158.561 kg (349 lb 9 oz), SpO2 98 %. Strength out of 5 wound clean dry and intact  Disposition: 01-Home or Self Care     Medication List    TAKE these medications        amphetamine-dextroamphetamine 10 MG tablet  Commonly known as:  ADDERALL  Take 10 mg by mouth 2 (two) times daily with a meal.     AUBAGIO 14 MG Tabs  Generic drug:  Teriflunomide  Take 14 mg by mouth at bedtime. Dr Rae Mar     DULoxetine 60 MG capsule  Commonly known as:  CYMBALTA  Take 60 mg by mouth 2 (two) times daily.     gabapentin 600 MG tablet  Commonly known as:  NEURONTIN  Take 600 mg by mouth 4 (four) times daily.     HYDROcodone-acetaminophen 10-325 MG tablet  Commonly known as:  NORCO  Take 2 tablets by mouth every 4 (four) hours as needed for severe pain.     ibuprofen 800 MG tablet  Commonly known as:  ADVIL,MOTRIN  Take 800 mg by mouth every 8 (eight) hours as needed for moderate pain.     Oxycodone HCl 10 MG Tabs  Commonly  known as:  ROXICODONE  Take 2 tablets (20 mg total) by mouth every 4 (four) hours as needed for severe pain.     tamsulosin 0.4 MG Caps capsule  Commonly known as:  FLOMAX  Take 0.4 mg by mouth daily.     tiZANidine 4 MG tablet  Commonly known as:  ZANAFLEX  Take 4 mg by mouth every 6 (six) hours as needed for muscle spasms.           Follow-up Information    Follow up with [facility] P, MD.   Specialty:  Neurosurgery   Contact information:   [address] [place] [address] [phone]       Signed: Elaina Hoops 04/19/2015, 8:32 AM

## 2015-04-19 NOTE — Progress Notes (Signed)
Pt aware of discharge plan. Aware MD in surgery and will sign prescription when out. Can discharge home then. Agreeable. Wendee Copp

## 2015-04-19 NOTE — Progress Notes (Signed)
Patient ID: Mark [name], male   DOB: 03-06-1961, 54 y.o.   MRN: [id number] Doing well no leg pain  Strength out of 5 wound clean dry and intact  Discharge home

## 2015-04-19 NOTE — Progress Notes (Signed)
Patient placed self on CPAP and was sleeping. Patient was tollerating well

## 2015-04-22 ENCOUNTER — Encounter: Payer: Self-pay | Admitting: *Deleted

## 2015-04-22 ENCOUNTER — Emergency Department
Admission: EM | Admit: 2015-04-22 | Discharge: 2015-04-22 | Disposition: A | Discharge status: Home / Self Care | Payer: Medicare Other | Source: Home / Self Care | Attending: Family Medicine | Admitting: Family Medicine

## 2015-04-22 ENCOUNTER — Emergency Department (INDEPENDENT_AMBULATORY_CARE_PROVIDER_SITE_OTHER): Payer: Medicare Other

## 2015-04-22 DIAGNOSIS — R05 Cough: Secondary | ICD-10-CM

## 2015-04-22 DIAGNOSIS — R509 Fever, unspecified: Secondary | ICD-10-CM | POA: Diagnosis not present

## 2015-04-22 DIAGNOSIS — J069 Acute upper respiratory infection, unspecified: Secondary | ICD-10-CM

## 2015-04-22 LAB — POCT URINALYSIS DIP (MANUAL ENTRY)
Bilirubin, UA: NEGATIVE
GLUCOSE UA: NEGATIVE
Ketones, POC UA: NEGATIVE
LEUKOCYTES UA: NEGATIVE
NITRITE UA: NEGATIVE
Spec Grav, UA: 1.02
UROBILINOGEN UA: 0.2
pH, UA: 6

## 2015-04-22 LAB — POCT CBC W AUTO DIFF (K'VILLE URGENT CARE)

## 2015-04-22 NOTE — ED Notes (Signed)
Pt had back surgery Monday and thursday evening began to feel ill.  He has had headache 8/10 pain, fever up to 104 on home thermometer and vomited once.  We did compare their thermometer to ours in clinic here today and theirs said 103 and ours 99.  Pt states no extra pain at incision site.

## 2015-04-22 NOTE — ED Provider Notes (Signed)
CSN: [id number]     Arrival date & time 04/22/15  1004 History   First MD Initiated Contact with Patient 04/22/15 1118     Chief Complaint  Patient presents with  . Fever  . Headache      HPI Comments: Patient is s/p L1-L2 fusion five days ago.  He had no post-op complications and was discharged 3 days ago.  24 hours ago he developed sweats and yesterday had a low grade fever.  His neurosurgeon prescribed a Z-pack, concerned that patient may have had a respiratory infection.  The patient states that he had had a mild cough prior to surgery, now improved but persistent.  He reports that he had chills/sweats this morning, and nausea with an episode of vomiting today.  No diarrhea or abdominal pain.  He is taking fluids without difficulty.  He has been fatigued during the past 48 hours.  He denies shortness of breath or pleuritic pain.  No urinary symptoms.  He reports that his back pain is better, and denies increased pain at his incision site.  The history is provided by the patient and the spouse.    Past Medical History  Diagnosis Date  . Morbid obesity ([place])   . Dysphonia     thoracic left sided"impingement" with heminumbness  . Chronic pain   . Depression   . Anxiety     chronic pain treated with narcotics  . MS (multiple sclerosis) ([place]) 02/11/2013    [name]  . MS (multiple sclerosis) ([place])   . Sleep apnea with use of continuous positive airway pressure (CPAP) 02/11/2013    07-13-12 AHi of 98.6 /hr titrated to 11 cm water ,  average user time 4 hours and 4 minutes. Residual AHI 1.20 December 2012 .  Marland Kitchen Pneumonia   . Constipation due to opioid therapy   . GI bleed     abt 15 years ago  . Umbilical hernia   . Neuromuscular disorder ([place])   . Arthritis    Past Surgical History  Procedure Laterality Date  . Carpal tunnel release Left     [name]  . Spine surgery      total of six vertebras fused, three lumbar surgeries and one anterior neck fusion  . Tonsillectomy     . Lower back fusion      surgery  . Colonoscopy w/ polypectomy     Family History  Problem Relation Age of Onset  . Sleep walking Son   . Cancer Other   . Cancer Other    Social History  Substance Use Topics  . Smoking status: Never Smoker   . Smokeless tobacco: Never Used  . Alcohol Use: Yes     Comment: occasionally    Review of Systems No sore throat + cough No pleuritic pain No wheezing No nasal congestion No post-nasal drainage No sinus pain/pressure No itchy/red eyes No earache No hemoptysis No SOB + fever, + chills/sweats + nausea + vomiting, resolved No abdominal pain No diarrhea No urinary symptoms No skin rash + fatigue + myalgias + headache Used OTC meds without relief  Allergies  Review of patient's allergies indicates no known allergies.  Home Medications   Prior to Admission medications   Medication Sig Start Date End Date Taking? Authorizing Provider  azithromycin (ZITHROMAX) 250 MG tablet Take by mouth daily.   Yes Historical Provider, MD  amphetamine-dextroamphetamine (ADDERALL) 10 MG tablet Take 10 mg by mouth 2 (two) times daily with a  meal.    Historical Provider, MD  DULoxetine (CYMBALTA) 60 MG capsule Take 60 mg by mouth 2 (two) times daily.  01/25/13   Historical Provider, MD  gabapentin (NEURONTIN) 600 MG tablet Take 600 mg by mouth 4 (four) times daily.    Historical Provider, MD  HYDROcodone-acetaminophen (NORCO) 10-325 MG per tablet Take 2 tablets by mouth every 4 (four) hours as needed for severe pain.  02/01/13   Historical Provider, MD  ibuprofen (ADVIL,MOTRIN) 800 MG tablet Take 800 mg by mouth every 8 (eight) hours as needed for moderate pain.    Historical Provider, MD  Oxycodone HCl 10 MG TABS Take 2 tablets (20 mg total) by mouth every 4 (four) hours as needed for severe pain. 04/19/15   [name], MD  tamsulosin (FLOMAX) 0.4 MG CAPS capsule Take 0.4 mg by mouth daily.    Historical Provider, MD  Teriflunomide (AUBAGIO) 14 MG  TABS Take 14 mg by mouth at bedtime. Dr Rae Mar    Historical Provider, MD  tiZANidine (ZANAFLEX) 4 MG tablet Take 4 mg by mouth every 6 (six) hours as needed for muscle spasms.     Historical Provider, MD   Meds Ordered and Administered this Visit  Medications - No data to display  BP 121/61 mmHg  Pulse 88  Temp(Src) 99 F (37.2 C) (Oral)  Ht 6' (1.829 m)  Wt 348 lb (157.852 kg)  BMI 47.19 kg/m2  SpO2 95% No data found.   Physical Exam Nursing notes and Vital Signs reviewed. Appearance:  Patient appears stated age, and in no acute distress.  Patient is obese (BMI 47.2) Eyes:  Pupils are equal, round, and reactive to light and accomodation.  Extraocular movement is intact.  Conjunctivae are not inflamed  Ears:  Canals normal.  Tympanic membranes normal.  Nose:   Normal turbinates.  No sinus tenderness.  Mouth:  moist mucous membranes  Pharynx:  Normal Neck:  Supple.  No adenopathy  Lungs:  Clear to auscultation.  Breath sounds are equal.  Moving air well. Heart:  Regular rate and rhythm without murmurs, rubs, or gallops.  Abdomen:  Nontender without masses or hepatosplenomegaly.  Bowel sounds are present.  No CVA or flank tenderness.  Back:  Midline surgical scar without swelling, discharge, or erythema; minimal surrounding tenderness to palpation. Extremities:  Trace lower leg edema.  No calf tenderness Skin:  No rash present.   ED Course  Procedures  None    Labs Reviewed  POCT URINALYSIS DIP (MANUAL ENTRY) - Abnormal; Notable for the following:    Blood, UA trace-intact (*)    Protein Ur, POC trace (*)    All other components within normal limits  URINE CULTURE  POCT CBC W AUTO DIFF ([facility]):  WBC 7.0; LY 16.6; MO 8.7; GR 74.7; Hgb 9.0; Platelets 207     Imaging Review Dg Chest 2 View  04/22/2015   CLINICAL DATA:  Cough and fever.  Recent lumbar fusion.  EXAM: CHEST  2 VIEW  COMPARISON:  08/01/2012  FINDINGS: Cardiac enlargement. Negative for heart  failure. Negative for pneumonia or effusion. Lumbar fusion hardware noted.  IMPRESSION: No active cardiopulmonary disease.   Electronically Signed   By: Franchot Gallo M.D.   On: 04/22/2015 12:31     MDM   1. Acute upper respiratory infection    Continue present medications.  Check temperature daily. Note anemia on today's CBC (Hgb 9.0).  Previous Hgb on 04/05/15 was 13.2 Followup with Family Doctor  if not improved in about 3 days.  Follow-up with neurosurgeon as scheduled. If symptoms become significantly worse during the night or over the weekend, proceed to the local emergency room.     Kandra Nicolas, MD 04/23/15 [phone]

## 2015-04-23 LAB — URINE CULTURE
COLONY COUNT: NO GROWTH
Organism ID, Bacteria: NO GROWTH

## 2015-04-23 NOTE — Discharge Instructions (Signed)
Continue present medications.  Check temperature daily. Follow-up with neurosurgeon as scheduled. If symptoms become significantly worse during the night or over the weekend, proceed to the local emergency room.

## 2015-04-24 ENCOUNTER — Telehealth: Payer: Self-pay | Admitting: *Deleted

## 2015-06-01 ENCOUNTER — Encounter: Payer: Self-pay | Admitting: Family Medicine

## 2015-06-01 ENCOUNTER — Ambulatory Visit (INDEPENDENT_AMBULATORY_CARE_PROVIDER_SITE_OTHER): Payer: Medicare Other | Admitting: Family Medicine

## 2015-06-01 VITALS — BP 147/73 | HR 73 | Temp 98.6°F | Wt 342.0 lb

## 2015-06-01 DIAGNOSIS — R52 Pain, unspecified: Secondary | ICD-10-CM | POA: Diagnosis not present

## 2015-06-01 NOTE — Progress Notes (Signed)
   Subjective:    Patient ID: Mark Spitz., male    DOB: [birth year], 54 y.o.   MRN: [id number]  HPI Has a really ST 2 days ago.  Had some sweats. No fever. Some cough. + bodyaches.  No SOB.  + nightsweats. Says drenched the sheets last night. + sick contacts. No recent travel outside the country. Had back surgery 6 weeks ago.    Review of Systems     Objective:   Physical Exam  Constitutional: He is oriented to person, place, and time. He appears well-developed and well-nourished.  HENT:  Head: Normocephalic and atraumatic.  Right Ear: External ear normal.  Left Ear: External ear normal.  Nose: Nose normal.  Mouth/Throat: Oropharynx is clear and moist.  TMs and canals are clear.   Eyes: Conjunctivae and EOM are normal. Pupils are equal, round, and reactive to light.  Neck: Neck supple. No thyromegaly present.  Cardiovascular: Normal rate and normal heart sounds.   Pulmonary/Chest: Effort normal and breath sounds normal.  Lymphadenopathy:    He has no cervical adenopathy.  Neurological: He is alert and oriented to person, place, and time.  Skin: Skin is warm and dry.  Psychiatric: He has a normal mood and affect.          Assessment & Plan:  URI - likely viral. Neg for influenza.  Symptomatic care. Call if getting worse or not better.

## 2015-06-01 NOTE — Patient Instructions (Signed)
Upper Respiratory Infection, Adult Most upper respiratory infections (URIs) are a viral infection of the air passages leading to the lungs. A URI affects the nose, throat, and upper air passages. The most common type of URI is nasopharyngitis and is typically referred to as "the common cold." URIs run their course and usually go away on their own. Most of the time, a URI does not require medical attention, but sometimes a bacterial infection in the upper airways can follow a viral infection. This is called a secondary infection. Sinus and middle ear infections are common types of secondary upper respiratory infections. Bacterial pneumonia can also complicate a URI. A URI can worsen asthma and chronic obstructive pulmonary disease (COPD). Sometimes, these complications can require emergency medical care and may be life threatening.  CAUSES Almost all URIs are caused by viruses. A virus is a type of germ and can spread from one person to another.  RISKS FACTORS You may be at risk for a URI if:   You smoke.   You have chronic heart or lung disease.  You have a weakened defense (immune) system.   You are very young or very old.   You have nasal allergies or asthma.  You work in crowded or poorly ventilated areas.  You work in health care facilities or schools. SIGNS AND SYMPTOMS  Symptoms typically develop 2-3 days after you come in contact with a cold virus. Most viral URIs last 7-10 days. However, viral URIs from the influenza virus (flu virus) can last 14-18 days and are typically more severe. Symptoms may include:   Runny or stuffy (congested) nose.   Sneezing.   Cough.   Sore throat.   Headache.   Fatigue.   Fever.   Loss of appetite.   Pain in your forehead, behind your eyes, and over your cheekbones (sinus pain).  Muscle aches.  DIAGNOSIS  Your health care provider may diagnose a URI by:  Physical exam.  Tests to check that your symptoms are not due to  another condition such as:  Strep throat.  Sinusitis.  Pneumonia.  Asthma. TREATMENT  A URI goes away on its own with time. It cannot be cured with medicines, but medicines may be prescribed or recommended to relieve symptoms. Medicines may help:  Reduce your fever.  Reduce your cough.  Relieve nasal congestion. HOME CARE INSTRUCTIONS   Take medicines only as directed by your health care provider.   Gargle warm saltwater or take cough drops to comfort your throat as directed by your health care provider.  Use a warm mist humidifier or inhale steam from a shower to increase air moisture. This may make it easier to breathe.  Drink enough fluid to keep your urine clear or pale yellow.   Eat soups and other clear broths and maintain good nutrition.   Rest as needed.   Return to work when your temperature has returned to normal or as your health care provider advises. You may need to stay home longer to avoid infecting others. You can also use a face mask and careful hand washing to prevent spread of the virus.  Increase the usage of your inhaler if you have asthma.   Do not use any tobacco products, including cigarettes, chewing tobacco, or electronic cigarettes. If you need help quitting, ask your health care provider. PREVENTION  The best way to protect yourself from getting a cold is to practice good hygiene.   Avoid oral or hand contact with people with cold   symptoms.   Wash your hands often if contact occurs.  There is no clear evidence that vitamin C, vitamin E, echinacea, or exercise reduces the chance of developing a cold. However, it is always recommended to get plenty of rest, exercise, and practice good nutrition.  SEEK MEDICAL CARE IF:   You are getting worse rather than better.   Your symptoms are not controlled by medicine.   You have chills.  You have worsening shortness of breath.  You have brown or red mucus.  You have yellow or brown nasal  discharge.  You have pain in your face, especially when you bend forward.  You have a fever.  You have swollen neck glands.  You have pain while swallowing.  You have white areas in the back of your throat. SEEK IMMEDIATE MEDICAL CARE IF:   You have severe or persistent:  Headache.  Ear pain.  Sinus pain.  Chest pain.  You have chronic lung disease and any of the following:  Wheezing.  Prolonged cough.  Coughing up blood.  A change in your usual mucus.  You have a stiff neck.  You have changes in your:  Vision.  Hearing.  Thinking.  Mood. MAKE SURE YOU:   Understand these instructions.  Will watch your condition.  Will get help right away if you are not doing well or get worse.   This information is not intended to replace advice given to you by your health care provider. Make sure you discuss any questions you have with your health care provider.   Document Released: 01/01/2001 Document Revised: 11/22/2014 Document Reviewed: 10/13/2013 Elsevier Interactive Patient Education 2016 [employer]  

## 2015-07-07 ENCOUNTER — Other Ambulatory Visit: Payer: Self-pay | Admitting: Urology

## 2015-07-07 DIAGNOSIS — G35 Multiple sclerosis: Secondary | ICD-10-CM

## 2015-07-25 ENCOUNTER — Ambulatory Visit
Admission: RE | Admit: 2015-07-25 | Discharge: 2015-07-25 | Disposition: A | Discharge status: Home / Self Care | Payer: Medicare Other | Source: Ambulatory Visit | Attending: Urology | Admitting: Urology

## 2015-07-25 DIAGNOSIS — G35 Multiple sclerosis: Secondary | ICD-10-CM

## 2015-07-25 MED ORDER — GADOBENATE DIMEGLUMINE 529 MG/ML IV SOLN
20.0000 mL | Freq: Once | INTRAVENOUS | Status: AC | PRN
Start: 2015-07-25 — End: 2015-07-25
  Administered 2015-07-25: 20 mL via INTRAVENOUS

## 2015-09-11 DIAGNOSIS — G629 Polyneuropathy, unspecified: Secondary | ICD-10-CM | POA: Diagnosis not present

## 2015-09-11 DIAGNOSIS — R5383 Other fatigue: Secondary | ICD-10-CM | POA: Diagnosis not present

## 2015-09-11 DIAGNOSIS — M7989 Other specified soft tissue disorders: Secondary | ICD-10-CM | POA: Diagnosis not present

## 2015-09-11 DIAGNOSIS — Z79899 Other long term (current) drug therapy: Secondary | ICD-10-CM | POA: Insufficient documentation

## 2015-09-11 DIAGNOSIS — G4733 Obstructive sleep apnea (adult) (pediatric): Secondary | ICD-10-CM | POA: Diagnosis not present

## 2015-09-11 DIAGNOSIS — G35 Multiple sclerosis: Secondary | ICD-10-CM | POA: Diagnosis not present

## 2015-09-11 DIAGNOSIS — G8381 Brown-Sequard syndrome: Secondary | ICD-10-CM | POA: Diagnosis not present

## 2015-09-11 DIAGNOSIS — Z5181 Encounter for therapeutic drug level monitoring: Secondary | ICD-10-CM | POA: Diagnosis not present

## 2015-09-26 ENCOUNTER — Ambulatory Visit (INDEPENDENT_AMBULATORY_CARE_PROVIDER_SITE_OTHER): Payer: Medicare Other | Admitting: Family Medicine

## 2015-09-26 ENCOUNTER — Encounter: Payer: Self-pay | Admitting: Family Medicine

## 2015-09-26 VITALS — BP 153/86 | HR 88 | Temp 98.8°F | Wt 348.0 lb

## 2015-09-26 DIAGNOSIS — R52 Pain, unspecified: Secondary | ICD-10-CM | POA: Diagnosis not present

## 2015-09-26 DIAGNOSIS — R509 Fever, unspecified: Secondary | ICD-10-CM | POA: Diagnosis not present

## 2015-09-26 DIAGNOSIS — R05 Cough: Secondary | ICD-10-CM | POA: Diagnosis not present

## 2015-09-26 LAB — POCT INFLUENZA A/B
INFLUENZA A, POC: NEGATIVE
Influenza B, POC: NEGATIVE

## 2015-09-26 NOTE — Progress Notes (Signed)
Mark [name]. is a 55 y.o. male who presents to [facility] today for body aches. Patient has bilateral upper and lower body aches and soreness. This is been present for about a day. He's been exposed to somebody who was positive for flu. No vomiting or diarrhea. Patient notes mild fever at home. He did not receive a flu vaccine this year. He thinks perhaps his pain may be due to a flareup of his MS. No new weakness or numbness or loss of function. No new bowel or bladder dysfunction.   Past Medical History  Diagnosis Date  . Morbid obesity ([place])   . Dysphonia     thoracic left sided"impingement" with heminumbness  . Chronic pain   . Depression   . Anxiety     chronic pain treated with narcotics  . MS (multiple sclerosis) ([place]) 02/11/2013    [name]  . MS (multiple sclerosis) ([place])   . Sleep apnea with use of continuous positive airway pressure (CPAP) 02/11/2013    07-13-12 AHi of 98.6 /hr titrated to 11 cm water ,  average user time 4 hours and 4 minutes. Residual AHI 1.20 December 2012 .  Marland Kitchen Pneumonia   . Constipation due to opioid therapy   . GI bleed     abt 15 years ago  . Umbilical hernia   . Neuromuscular disorder ([place])   . Arthritis    Past Surgical History  Procedure Laterality Date  . Carpal tunnel release Left     [name]  . Spine surgery      total of six vertebras fused, three lumbar surgeries and one anterior neck fusion  . Tonsillectomy    . Posterior lumbar fusion  04/17/15    [name], also removed old hardware  . Colonoscopy w/ polypectomy     Social History  Substance Use Topics  . Smoking status: Never Smoker   . Smokeless tobacco: Never Used  . Alcohol Use: Yes     Comment: occasionally   family history includes Cancer in his other and other; Sleep walking in his son.  ROS as above Medications: Current Outpatient Prescriptions   Medication Sig Dispense Refill  . amphetamine-dextroamphetamine (ADDERALL) 10 MG tablet Take 10 mg by mouth 2 (two) times daily with a meal.    . DULoxetine (CYMBALTA) 60 MG capsule Take 60 mg by mouth 2 (two) times daily.     Marland Kitchen gabapentin (NEURONTIN) 600 MG tablet Take 600 mg by mouth 4 (four) times daily.    Marland Kitchen HYDROcodone-acetaminophen (NORCO) 10-325 MG per tablet Take 2 tablets by mouth every 4 (four) hours as needed for severe pain.     Marland Kitchen ibuprofen (ADVIL,MOTRIN) 800 MG tablet Take 800 mg by mouth every 8 (eight) hours as needed for moderate pain.    . tamsulosin (FLOMAX) 0.4 MG CAPS capsule Take 0.4 mg by mouth daily.    . Teriflunomide (AUBAGIO) 14 MG TABS Take 14 mg by mouth at bedtime. Dr Rae Mar    . tiZANidine (ZANAFLEX) 4 MG tablet Take 4 mg by mouth every 6 (six) hours as needed for muscle spasms.      No current facility-administered medications for this visit.   No Known Allergies   Exam:  BP 153/86 mmHg  Pulse 88  Temp(Src) 98.8 F (37.1 C) (Oral)  Wt 348 lb (157.852 kg)  SpO2 98% Gen: Well NAD HEENT: EOMI,  MMM Lungs: Normal  work of breathing. CTABL Heart: RRR no MRG Abd: NABS, Soft. Nondistended, Nontender Exts: Brisk capillary refill, warm and well perfused.  Lower extremity strength is intact to knee extension and flexion. Patient walks with a painful gait.     Results for orders placed or performed in visit on 09/26/15 (from the past 24 hour(s))  POCT Influenza A/B     Status: None   Collection Time: 09/26/15  1:46 PM  Result Value Ref Range   Influenza A, POC Negative Negative   Influenza B, POC Negative Negative   No results found.   55 year old male with a history of multiple sclerosis with myalgias. Likely due to viral etiology. Influenza test negative. This may be a flare of MS. Patient should have a prednisone dose being called in by his neurologist soon. He can take that I feel it reasonable. Additionally we'll check CBC CK and metabolic  panel. Return if not improving. Discussed warning signs or symptoms.

## 2015-09-26 NOTE — Patient Instructions (Addendum)
Thank you for coming in today. Flu test was negative.  Get labs now.  Return if not better.  Call or go to the emergency room if you get worse, have trouble breathing, have chest pains, or palpitations.   Muscle Pain, Adult Muscle pain (myalgia) may be caused by many things, including:  Overuse or muscle strain, especially if you are not in shape. This is the most common cause of muscle pain.  Injury.  Bruises.  Viruses, such as the flu.  Infectious diseases.  Fibromyalgia, which is a chronic condition that causes muscle tenderness, fatigue, and headache.  Autoimmune diseases, including lupus.  Certain drugs, including ACE inhibitors and statins. Muscle pain may be mild or severe. In most cases, the pain lasts only a short time and goes away without treatment. To diagnose the cause of your muscle pain, your health care provider will take your medical history. This means he or she will ask you when your muscle pain began and what has been happening. If you have not had muscle pain for very long, your health care provider may want to wait before doing much testing. If your muscle pain has lasted a long time, your health care provider may want to run tests right away. If your health care provider thinks your muscle pain may be caused by illness, you may need to have additional tests to rule out certain conditions.  Treatment for muscle pain depends on the cause. Home care is often enough to relieve muscle pain. Your health care provider may also prescribe anti-inflammatory medicine. HOME CARE INSTRUCTIONS Watch your condition for any changes. The following actions may help to lessen any discomfort you are feeling:  Only take over-the-counter or prescription medicines as directed by your health care provider.  Apply ice to the sore muscle:  Put ice in a plastic bag.  Place a towel between your skin and the bag.  Leave the ice on for 15-20 minutes, 3-4 times a day.  You may  alternate applying hot and cold packs to the muscle as directed by your health care provider.  If overuse is causing your muscle pain, slow down your activities until the pain goes away.  Remember that it is normal to feel some muscle pain after starting a workout program. Muscles that have not been used often will be sore at first.  Do regular, gentle exercises if you are not usually active.  Warm up before exercising to lower your risk of muscle pain.  Do not continue working out if the pain is very bad. Bad pain could mean you have injured a muscle. SEEK MEDICAL CARE IF:  Your muscle pain gets worse, and medicines do not help.  You have muscle pain that lasts longer than 3 days.  You have a rash or fever along with muscle pain.  You have muscle pain after a tick bite.  You have muscle pain while working out, even though you are in good physical condition.  You have redness, soreness, or swelling along with muscle pain.  You have muscle pain after starting a new medicine or changing the dose of a medicine. SEEK IMMEDIATE MEDICAL CARE IF:  You have trouble breathing.  You have trouble swallowing.  You have muscle pain along with a stiff neck, fever, and vomiting.  You have severe muscle weakness or cannot move part of your body. MAKE SURE YOU:   Understand these instructions.  Will watch your condition.  Will get help right away if you are  not doing well or get worse.   This information is not intended to replace advice given to you by your health care provider. Make sure you discuss any questions you have with your health care provider.   Document Released: 05/30/2006 Document Revised: 07/29/2014 Document Reviewed: 05/04/2013 Elsevier Interactive Patient Education [employer].

## 2015-09-27 DIAGNOSIS — B9789 Other viral agents as the cause of diseases classified elsewhere: Secondary | ICD-10-CM | POA: Insufficient documentation

## 2015-09-27 DIAGNOSIS — G35 Multiple sclerosis: Secondary | ICD-10-CM | POA: Diagnosis not present

## 2015-09-27 DIAGNOSIS — M60009 Infective myositis, unspecified site: Secondary | ICD-10-CM | POA: Diagnosis not present

## 2015-09-27 DIAGNOSIS — G8381 Brown-Sequard syndrome: Secondary | ICD-10-CM | POA: Diagnosis not present

## 2015-09-27 LAB — CBC
HCT: 39.3 % (ref 39.0–52.0)
HEMOGLOBIN: 13 g/dL (ref 13.0–17.0)
MCH: 27.1 pg (ref 26.0–34.0)
MCHC: 33.1 g/dL (ref 30.0–36.0)
MCV: 82 fL (ref 78.0–100.0)
MPV: 9.6 fL (ref 8.6–12.4)
Platelets: 236 10*3/uL (ref 150–400)
RBC: 4.79 MIL/uL (ref 4.22–5.81)
RDW: 15.4 % (ref 11.5–15.5)
WBC: 5.2 10*3/uL (ref 4.0–10.5)

## 2015-09-27 LAB — COMPREHENSIVE METABOLIC PANEL
ALBUMIN: 4.2 g/dL (ref 3.6–5.1)
ALT: 17 U/L (ref 9–46)
AST: 16 U/L (ref 10–35)
Alkaline Phosphatase: 74 U/L (ref 40–115)
BUN: 11 mg/dL (ref 7–25)
CHLORIDE: 101 mmol/L (ref 98–110)
CO2: 27 mmol/L (ref 20–31)
Calcium: 9.1 mg/dL (ref 8.6–10.3)
Creat: 0.74 mg/dL (ref 0.70–1.33)
Glucose, Bld: 104 mg/dL — ABNORMAL HIGH (ref 65–99)
POTASSIUM: 4.4 mmol/L (ref 3.5–5.3)
Sodium: 136 mmol/L (ref 135–146)
TOTAL PROTEIN: 6.4 g/dL (ref 6.1–8.1)
Total Bilirubin: 0.4 mg/dL (ref 0.2–1.2)

## 2015-09-27 LAB — CK: CK TOTAL: 71 U/L (ref 7–232)

## 2015-09-27 NOTE — Progress Notes (Signed)
Quick Note:    Labs are normal.  ______

## 2015-10-02 ENCOUNTER — Ambulatory Visit (INDEPENDENT_AMBULATORY_CARE_PROVIDER_SITE_OTHER): Payer: Medicare Other

## 2015-10-02 ENCOUNTER — Encounter: Payer: Self-pay | Admitting: Family Medicine

## 2015-10-02 ENCOUNTER — Ambulatory Visit (INDEPENDENT_AMBULATORY_CARE_PROVIDER_SITE_OTHER): Payer: Medicare Other | Admitting: Family Medicine

## 2015-10-02 VITALS — BP 146/83 | HR 67 | Temp 97.7°F | Wt 336.0 lb

## 2015-10-02 DIAGNOSIS — R509 Fever, unspecified: Secondary | ICD-10-CM

## 2015-10-02 DIAGNOSIS — R059 Cough, unspecified: Secondary | ICD-10-CM

## 2015-10-02 DIAGNOSIS — R05 Cough: Secondary | ICD-10-CM

## 2015-10-02 DIAGNOSIS — R053 Chronic cough: Secondary | ICD-10-CM | POA: Insufficient documentation

## 2015-10-02 MED ORDER — IPRATROPIUM-ALBUTEROL 0.5-2.5 (3) MG/3ML IN SOLN
3.0000 mL | Freq: Once | RESPIRATORY_TRACT | Status: AC
  Administered 2015-10-02: 3 mL via RESPIRATORY_TRACT

## 2015-10-02 MED ORDER — BENZONATATE 200 MG PO CAPS: 200.0000 mg | ORAL_CAPSULE | Freq: Three times a day (TID) | ORAL | Status: DC | PRN

## 2015-10-02 MED ORDER — PREDNISONE 50 MG PO TABS: 50.0000 mg | ORAL_TABLET | Freq: Every day | ORAL | Status: DC

## 2015-10-02 MED ORDER — AZITHROMYCIN 250 MG PO TABS: 250.0000 mg | ORAL_TABLET | Freq: Every day | ORAL | Status: DC

## 2015-10-02 MED ORDER — GUAIFENESIN-CODEINE 100-10 MG/5ML PO SOLN: 5.0000 mL | Freq: Every evening | ORAL | Status: DC | PRN

## 2015-10-02 MED ORDER — ALBUTEROL SULFATE HFA 108 (90 BASE) MCG/ACT IN AERS: 2.0000 | INHALATION_SPRAY | Freq: Four times a day (QID) | RESPIRATORY_TRACT | Status: DC | PRN

## 2015-10-02 NOTE — Assessment & Plan Note (Signed)
Likely more bronchitis and pneumonia. Awaiting formal radiology review. Patient already recently had prednisone course. We'll prescribe azithromycin codeine cough syrup Tessalon Perles and albuterol as patient had good response to DuoNeb. Backup 5 day 50 mg prednisone burst. Return as needed.

## 2015-10-02 NOTE — Patient Instructions (Signed)
Thank you for coming in today. Take azithromycin antibiotics. Use albuterol inhaler as needed. Take Tessalon during the day and codeine cough syrup at night. Use prednisone if not getting better. We will contact you regarding x-ray results. Call or go to the emergency room if you get worse, have trouble breathing, have chest pains, or palpitations.   Acute Bronchitis Bronchitis is inflammation of the airways that extend from the windpipe into the lungs (bronchi). The inflammation often causes mucus to develop. This leads to a cough, which is the most common symptom of bronchitis.  In acute bronchitis, the condition usually develops suddenly and goes away over time, usually in a couple weeks. Smoking, allergies, and asthma can make bronchitis worse. Repeated episodes of bronchitis may cause further lung problems.  CAUSES Acute bronchitis is most often caused by the same virus that causes a cold. The virus can spread from person to person (contagious) through coughing, sneezing, and touching contaminated objects. SIGNS AND SYMPTOMS   Cough.   Fever.   Coughing up mucus.   Body aches.   Chest congestion.   Chills.   Shortness of breath.   Sore throat.  DIAGNOSIS  Acute bronchitis is usually diagnosed through a physical exam. Your health care provider will also ask you questions about your medical history. Tests, such as chest X-rays, are sometimes done to rule out other conditions.  TREATMENT  Acute bronchitis usually goes away in a couple weeks. Oftentimes, no medical treatment is necessary. Medicines are sometimes given for relief of fever or cough. Antibiotic medicines are usually not needed but may be prescribed in certain situations. In some cases, an inhaler may be recommended to help reduce shortness of breath and control the cough. A cool mist vaporizer may also be used to help thin bronchial secretions and make it easier to clear the chest.  HOME CARE  INSTRUCTIONS  Get plenty of rest.   Drink enough fluids to keep your urine clear or pale yellow (unless you have a medical condition that requires fluid restriction). Increasing fluids may help thin your respiratory secretions (sputum) and reduce chest congestion, and it will prevent dehydration.   Take medicines only as directed by your health care provider.  If you were prescribed an antibiotic medicine, finish it all even if you start to feel better.  Avoid smoking and secondhand smoke. Exposure to cigarette smoke or irritating chemicals will make bronchitis worse. If you are a smoker, consider using nicotine gum or skin patches to help control withdrawal symptoms. Quitting smoking will help your lungs heal faster.   Reduce the chances of another bout of acute bronchitis by washing your hands frequently, avoiding people with cold symptoms, and trying not to touch your hands to your mouth, nose, or eyes.   Keep all follow-up visits as directed by your health care provider.  SEEK MEDICAL CARE IF: Your symptoms do not improve after 1 week of treatment.  SEEK IMMEDIATE MEDICAL CARE IF:  You develop an increased fever or chills.   You have chest pain.   You have severe shortness of breath.  You have bloody sputum.   You develop dehydration.  You faint or repeatedly feel like you are going to pass out.  You develop repeated vomiting.  You develop a severe headache. MAKE SURE YOU:   Understand these instructions.  Will watch your condition.  Will get help right away if you are not doing well or get worse.   This information is not intended to replace  advice given to you by your health care provider. Make sure you discuss any questions you have with your health care provider.   Document Released: 08/15/2004 Document Revised: 07/29/2014 Document Reviewed: 12/29/2012 Elsevier Interactive Patient Education [employer].

## 2015-10-02 NOTE — Progress Notes (Signed)
Mark [name]. is a 55 y.o. male who presents to [facility] today for Cough. Patient was seen last week for body aches and subjective fever. This was thought to be due to a viral etiology. Certainly afterwards he developed a cough and wheezing. He notes some chest tightness  As well as a persistent nagging and bothersome cough. He denies any severe shortness of breath or chest pain. He notes subjective fevers.  He has tried some over-the-counter medicines which did not help much.  His neurologist prescribed prednisone which helped his body aches.   Past Medical History  Diagnosis Date  . Morbid obesity ([place])   . Dysphonia     thoracic left sided"impingement" with heminumbness  . Chronic pain   . Depression   . Anxiety     chronic pain treated with narcotics  . MS (multiple sclerosis) ([place]) 02/11/2013    [name]  . MS (multiple sclerosis) ([place])   . Sleep apnea with use of continuous positive airway pressure (CPAP) 02/11/2013    07-13-12 AHi of 98.6 /hr titrated to 11 cm water ,  average user time 4 hours and 4 minutes. Residual AHI 1.20 December 2012 .  Marland Kitchen Pneumonia   . Constipation due to opioid therapy   . GI bleed     abt 15 years ago  . Umbilical hernia   . Neuromuscular disorder ([place])   . Arthritis    Past Surgical History  Procedure Laterality Date  . Carpal tunnel release Left     [name]  . Spine surgery      total of six vertebras fused, three lumbar surgeries and one anterior neck fusion  . Tonsillectomy    . Posterior lumbar fusion  04/17/15    [name], also removed old hardware  . Colonoscopy w/ polypectomy     Social History  Substance Use Topics  . Smoking status: Never Smoker   . Smokeless tobacco: Never Used  . Alcohol Use: Yes     Comment: occasionally   family history includes Cancer in his other and other; Sleep walking in his  son.  ROS as above Medications: Current Outpatient Prescriptions  Medication Sig Dispense Refill  . amphetamine-dextroamphetamine (ADDERALL) 10 MG tablet Take 10 mg by mouth 2 (two) times daily with a meal.    . DULoxetine (CYMBALTA) 60 MG capsule Take 60 mg by mouth 2 (two) times daily.     Marland Kitchen gabapentin (NEURONTIN) 600 MG tablet Take 600 mg by mouth 4 (four) times daily.    Marland Kitchen HYDROcodone-acetaminophen (NORCO) 10-325 MG per tablet Take 2 tablets by mouth every 4 (four) hours as needed for severe pain.     Marland Kitchen ibuprofen (ADVIL,MOTRIN) 800 MG tablet Take 800 mg by mouth every 8 (eight) hours as needed for moderate pain.    . tamsulosin (FLOMAX) 0.4 MG CAPS capsule Take 0.4 mg by mouth daily.    . Teriflunomide (AUBAGIO) 14 MG TABS Take 14 mg by mouth at bedtime. Dr Rae Mar    . tiZANidine (ZANAFLEX) 4 MG tablet Take 4 mg by mouth every 6 (six) hours as needed for muscle spasms.      No current facility-administered medications for this visit.   No Known Allergies   Exam:  BP 146/83 mmHg  Pulse 67  Temp(Src) 97.7 F (36.5 C) (Oral)  Wt 336 lb (152.409 kg)  SpO2 98% Gen: Well NAD HEENT: EOMI,  MMM Lungs: Normal work of breathing.  Wheezing present bilaterally Heart: RRR no MRG Abd: NABS, Soft. Nondistended, Nontender Exts: Brisk capillary refill, warm and well perfused.   Patient was given a 2.5/0.5mg  duoneb nebulizer, and felt better   CXR: Bronchitic changes. Large heart. No obvious infiltrate. Awaiting formal review.   No results found for this or any previous visit (from the past 24 hour(s)). No results found.   Please see individual assessment and plan sections.

## 2015-10-03 NOTE — Progress Notes (Signed)
Quick Note:  Normal, no changes. ______ 

## 2015-10-05 DIAGNOSIS — M5137 Other intervertebral disc degeneration, lumbosacral region: Secondary | ICD-10-CM | POA: Diagnosis not present

## 2015-10-05 DIAGNOSIS — Z6841 Body Mass Index (BMI) 40.0 and over, adult: Secondary | ICD-10-CM | POA: Diagnosis not present

## 2015-10-05 DIAGNOSIS — I10 Essential (primary) hypertension: Secondary | ICD-10-CM | POA: Diagnosis not present

## 2015-10-05 DIAGNOSIS — M542 Cervicalgia: Secondary | ICD-10-CM | POA: Diagnosis not present

## 2015-11-23 ENCOUNTER — Encounter: Payer: Self-pay | Admitting: Nurse Practitioner

## 2015-11-23 ENCOUNTER — Ambulatory Visit (INDEPENDENT_AMBULATORY_CARE_PROVIDER_SITE_OTHER): Payer: Medicare Other | Admitting: Nurse Practitioner

## 2015-11-23 VITALS — BP 140/85 | HR 78 | Resp 18 | Ht 73.0 in | Wt 350.0 lb

## 2015-11-23 DIAGNOSIS — G4733 Obstructive sleep apnea (adult) (pediatric): Secondary | ICD-10-CM

## 2015-11-23 NOTE — Progress Notes (Signed)
[facility]  PATIENT: Mark [name]. DOB: Dec 16, 1960   REASON FOR VISIT: follow up for obstructive sleep apnea, obesity HISTORY FROM:patient    HISTORY OF PRESENT ILLNESS:  HISTORY 02/16/14 [facility]): Mark [name] has been able to reduce his BMI and reduce thus his degree of OSA. He lost 24 pounds. His CPAP download today was not possible , he forgot the machine or memory chip. He is followed by [facility] . He has MS and is on Aubagio, noted delayed wound healing. He is fatigued , he is disabled. He has irregular sleep habits , based on his hobby. He goes to bed at 2 AM and Goes to sleep promptly , he rises at 8 AM but wears his CPAP nightly. He drinks about a gallon of caffeinated sweet tea a day, but no coffee and also rarely soda. Nonsmoker and nondrinker. The patient for a score prior to his last sleep study from 07-13-12 was 15 points and is becks inventory 25 points the patient now endorsed the Epworth sleepiness score at 7 points on CPAP and depression score at 0 points. Last visit note at [facility], [facility] : Mark [name] has been a long time user of CPAP since early 2008. In December 2013 he returned upon request of his DME company-i.[employer] for a sleep consult. The patient had stated that sleepiness had increased but he also was on chronic pain- not sleeping well at baseline and getting an average of 4 hours sleep. His old CPAP machine setting was not known and the memory card was not readable at the time of our encounter.The patient returned for a sleep study on 07/13/2012 and tested positive for a severe obstructive sleep apnea, his AHI was 96.1, his RDI 98.6.  Oxygen saturation at nadir was 79% but only for 17.4 minutes.The patient's study was SPLIT and CPAP initiated and titrated to 11 cm water.He had no central apneas and response to the titration in his AHI was significantly reduced.He used and nasal comfort gel mask. EPR was set at 3 cm  water. The patient also had a download from the CPAP in spring of this year ( 2014) showing borderline compliance.I have another download available for the last month and his average daily used to time is 4 hours 4 minutes so he is just concerned compliant. His AHI is now 1.1, his median airleak is moderate 4.8 L a minute. He states that he still is not able to sleep longer by using CPAP at the CPAP has significantly reduced his snoring and apneic events his fatigue severity score today is 48 points , his Epworth score is 6 points -reduced in comparison to his last visit. His March 14 download showed an AHI of 1.4 and June a residual AHI of 1.2.Is noted that if he doesn't wear his machine and mask he awakens with a very sore throat from the loud , thunderous snoring.His bedtime is 1 AM to 6 AM , will only get 4 hours average of nightly sleep- all his life , not a recent He has no cyclic sleep pattern, no depression history , and had a cervical fusion. He has frequent pneumonia , rhinitis, and rarely a sinus infection.  He just received disability 2014 for chronic pain, and MS .I also learned that Mark [name] who has undergone multiple back surgeries in the past and has a history of chronic back pain,and was diagnosed with a neurogenic bladder, developed dysesthesias earlier this year and was diagnosed by MRI  with multiple sclerosis. He is treated by Dr. Rae Mar , who has started him on Aubagio.  CDMr. [name] is seen today for a CPAP follow-up visit on 11-23-14. He has a diagnosis of obstructive sleep apnea as described above he is on CPAP currently using 11 cm water with full-time EPR of 3 cm water. At download over the last January was obtained. At the time he had 93% compliance for days of use and 63% compliance for over 4 hours of use his average daily time of use is 4 hours and 27 minutes and his residual AHI is 1.1 there is a high air leak which may be explained by the patient's facial  hair. His Epworth sleepiness score is 3 points and his fatigue severity score 63 points. The patient's machine today did not have a downloadable chip in it so we are calling his DME which is [facility] in [place]. In addition his high degree of fatigue can be relaxed related to his multiple sclerosis. He reports that he can hardly move his legs his legs just do not want to work cord" he does not have major pain with it but he is weak and in coordinated. He has fallen a couple of times. Also has right puffy ankles.He continues to take AUBAGi0 and is followed by Dr. Doyle Askew. He has been prescribed Adderall by Dr. Doyle Askew but he feels that he needs a higher dose or perhaps a more frequent dosing. His orthopedist prescribed NARCO, 120 tab at a time.  UPDATE 05/04/2017[name], 55 year old male returns for follow-up. He has history of obstructive sleep apnea with CPAP. His compliance is 90% at 27 days his average usage is 4 hours and 8 minutes.  However he only uses his machine for > 4 hours 13/27 days- 43 %. Leak is minimal..AHI of 1.1 at 11 cm of water,  His Epworth score is 4 points. His fatigue severity score is 55  Patient states that his fatigue is due to MS . Dr. Tressa Busman in [place] follows him for his MS. Patient reports that he gets about 10 hours of sleep a night. He goes to bed around 11-12 PM and arises at 10-11 AM. He has puffy ankles. He returns for reevaluation REVIEW OF SYSTEMS: Full 14 system review of systems performed and notable only for those listed, all others are neg:  Constitutional: fatigue  Cardiovascular: neg Ear/Nose/Throat: neg  Skin: neg Eyes: neg Respiratory: neg Gastroitestinal: neg  Hematology/Lymphatic: neg  Endocrine: neg Musculoskeletal:neg Allergy/Immunology: neg Neurological: neg Psychiatric: neg Sleep : obstructive sleep apnea with CPAP   ALLERGIES: No Known Allergies  HOME MEDICATIONS: Outpatient Prescriptions Prior to Visit  Medication Sig  Dispense Refill  . amphetamine-dextroamphetamine (ADDERALL) 10 MG tablet Take 10 mg by mouth 2 (two) times daily with a meal.    . DULoxetine (CYMBALTA) 60 MG capsule Take 60 mg by mouth 2 (two) times daily.     Marland Kitchen gabapentin (NEURONTIN) 600 MG tablet Take 600 mg by mouth 4 (four) times daily.    Marland Kitchen HYDROcodone-acetaminophen (NORCO) 10-325 MG per tablet Take 2 tablets by mouth every 4 (four) hours as needed for severe pain.     Marland Kitchen ibuprofen (ADVIL,MOTRIN) 800 MG tablet Take 800 mg by mouth every 8 (eight) hours as needed for moderate pain.    . tamsulosin (FLOMAX) 0.4 MG CAPS capsule Take 0.4 mg by mouth daily.    . Teriflunomide (AUBAGIO) 14 MG TABS Take 14 mg by mouth at bedtime. Dr Rae Mar    .  tiZANidine (ZANAFLEX) 4 MG tablet Take 4 mg by mouth every 6 (six) hours as needed for muscle spasms.     Marland Kitchen albuterol (PROVENTIL HFA;VENTOLIN HFA) 108 (90 Base) MCG/ACT inhaler Inhale 2 puffs into the lungs every 6 (six) hours as needed for wheezing or shortness of breath. 1 Inhaler 0  . azithromycin (ZITHROMAX) 250 MG tablet Take 1 tablet (250 mg total) by mouth daily. Take first 2 tablets together, then 1 every day until finished. 6 tablet 0  . benzonatate (TESSALON) 200 MG capsule Take 1 capsule (200 mg total) by mouth 3 (three) times daily as needed for cough. 45 capsule 0  . guaiFENesin-codeine 100-10 MG/5ML syrup Take 5 mLs by mouth at bedtime as needed for cough. 120 mL 0  . predniSONE (DELTASONE) 50 MG tablet Take 1 tablet (50 mg total) by mouth daily. 5 tablet 0   No facility-administered medications prior to visit.    PAST MEDICAL HISTORY: Past Medical History  Diagnosis Date  . Morbid obesity ([place])   . Dysphonia     thoracic left sided"impingement" with heminumbness  . Chronic pain   . Depression   . Anxiety     chronic pain treated with narcotics  . MS (multiple sclerosis) ([place]) 02/11/2013    [name]  . MS (multiple sclerosis) ([place])   . Sleep apnea with use of  continuous positive airway pressure (CPAP) 02/11/2013    07-13-12 AHi of 98.6 /hr titrated to 11 cm water ,  average user time 4 hours and 4 minutes. Residual AHI 1.20 December 2012 .  Marland Kitchen Pneumonia   . Constipation due to opioid therapy   . GI bleed     abt 15 years ago  . Umbilical hernia   . Neuromuscular disorder ([place])   . Arthritis     PAST SURGICAL HISTORY: Past Surgical History  Procedure Laterality Date  . Carpal tunnel release Left     [name]  . Spine surgery      total of six vertebras fused, three lumbar surgeries and one anterior neck fusion  . Tonsillectomy    . Posterior lumbar fusion  04/17/15    [name], also removed old hardware  . Colonoscopy w/ polypectomy      FAMILY HISTORY: Family History  Problem Relation Age of Onset  . Sleep walking Son   . Cancer Other   . Cancer Other     SOCIAL HISTORY: Social History   Social History  . Marital Status: Married    Spouse Name: [name]  . Number of Children: 2  . Years of Education: 12+   Occupational History  . Not on file.   Social History Main Topics  . Smoking status: Never Smoker   . Smokeless tobacco: Never Used  . Alcohol Use: Yes     Comment: occasionally  . Drug Use: No  . Sexual Activity: Not on file   Other Topics Concern  . Not on file   Social History Narrative   Last download from 2011 showed good response to 12 cm water with 3 cm EPR, but we have no record of the baseline AI.    Need to order  the old paper copies and send patient for retitration  to lab, after five years plus this will be a SPLIT at 15 and at 3% score. This calendar year preferred for deductible costs.    Patient is married [name]) and lives at home with his wife, daughter, son-in-law and  grandchild.   Patient has two adult children.   Patient is right-handed.   Patient is disabled.   Patient has a college education.   Patient drinks a gallon of tea daily.           PHYSICAL EXAM  Filed Vitals:   11/23/15 1517    BP: 140/85  Pulse: 78  Resp: 18  Height: 6\' 1"  (1.854 m)  Weight: 350 lb (158.759 kg)   Body mass index is 46.19 kg/(m^2). Generalized: Well developed, in no acute distress  Neck: circumference 20 inches, Mallampati 3+   Neurological examination  Mentation: Alert oriented to time, place, history taking. Follows all commands speech and language fluent Cranial nerve II-XII: Pupils were equal round reactive to light. Extraocular movements were full, visual field were full on confrontational test. Facial sensation and strength were normal. Uvula tongue midline. Head turning and shoulder shrug were normal and symmetric. Motor: The motor testing reveals 5 over 5 strength of all 4 extremities. Good symmetric motor tone is noted throughout.  Sensory: Sensory testing is intact to soft touch on all 4 extremities except decreased on the left upper and lower extremities. No evidence of extinction is noted.  Coordination: Cerebellar testing reveals good finger-nose-finger and heel-to-shin bilaterally.  Gait and station: Gait is wide based, very cautious and unsteady. Reflexes: Deep tendon reflexes are symmetric but depressed throughout  DIAGNOSTIC DATA (LABS, IMAGING, TESTING) - I reviewed patient records, labs, notes, testing and imaging myself where available.  Lab Results  Component Value Date   WBC 5.2 09/26/2015   HGB 13.0 09/26/2015   HCT 39.3 09/26/2015   MCV 82.0 09/26/2015   PLT 236 09/26/2015      Component Value Date/Time   NA 136 09/26/2015 1354   K 4.4 09/26/2015 1354   CL 101 09/26/2015 1354   CO2 27 09/26/2015 1354   GLUCOSE 104* 09/26/2015 1354   BUN 11 09/26/2015 1354   CREATININE 0.74 09/26/2015 1354   CREATININE 0.79 04/05/2015 1200   CALCIUM 9.1 09/26/2015 1354   PROT 6.4 09/26/2015 1354   ALBUMIN 4.2 09/26/2015 1354   AST 16 09/26/2015 1354   ALT 17 09/26/2015 1354   ALKPHOS 74 09/26/2015 1354   BILITOT 0.4 09/26/2015 1354   GFRNONAA >60 04/05/2015 1200    GFRNONAA >89 01/04/2015 1141   GFRAA >60 04/05/2015 1200   GFRAA >89 01/04/2015 1141   Lab Results  Component Value Date   CHOL 140 01/04/2015   HDL 43 01/04/2015   LDLCALC 78 01/04/2015   TRIG 93 01/04/2015   CHOLHDL 3.3 01/04/2015    Lab Results  Component Value Date   TSH 1.302 01/04/2015      ASSESSMENT AND PLAN 55 y.o. year old male  has a past medical history of Morbid obesity; Dysphonia; Chronic pain; Depression; Anxiety; MS (multiple sclerosis) (02/11/2013); MS (multiple sclerosis); and Sleep apnea with use of continuous positive airway pressure (CPAP) (02/11/2013). Hereto follow-up for his CPAP compliance    90% compliance at 27 days his average usage is 4 hours and 8 minutes.  However he only uses his machine for > 4 hours 13/27 days- 43 %. Leak is minimal.he attributes this to shaving his beard/facial hair.AHI of 1.1 at 11 cm of water,  His Epworth score is 4 points. His fatigue severity score is 55 Patient used his CPAP 27/30 days but he tends to not use it for >4 hours. I have asked the patient to start using the machine for >4 hours.  He states that usually he forgets to put it back on when he gets up to urinate.  His MS is followed by Dr. Doyle Askew in [place]. He will follow-up 1 year.  Mark [name], [facility], [facility], APRN  [facility] [address], [place], [place] [address] [phone]

## 2015-11-23 NOTE — Patient Instructions (Signed)
Will check with equipment company for more elastic for mask  Fairly good compliance with CPap F/U yearly for CPAP compliance

## 2015-11-27 ENCOUNTER — Encounter: Payer: Self-pay | Admitting: *Deleted

## 2015-12-08 DIAGNOSIS — E291 Testicular hypofunction: Secondary | ICD-10-CM | POA: Insufficient documentation

## 2015-12-08 DIAGNOSIS — N400 Enlarged prostate without lower urinary tract symptoms: Secondary | ICD-10-CM | POA: Insufficient documentation

## 2015-12-26 DIAGNOSIS — N401 Enlarged prostate with lower urinary tract symptoms: Secondary | ICD-10-CM | POA: Diagnosis not present

## 2015-12-26 DIAGNOSIS — N529 Male erectile dysfunction, unspecified: Secondary | ICD-10-CM | POA: Diagnosis not present

## 2015-12-26 DIAGNOSIS — R350 Frequency of micturition: Secondary | ICD-10-CM | POA: Diagnosis not present

## 2015-12-26 DIAGNOSIS — N138 Other obstructive and reflux uropathy: Secondary | ICD-10-CM | POA: Diagnosis not present

## 2015-12-26 DIAGNOSIS — E291 Testicular hypofunction: Secondary | ICD-10-CM | POA: Diagnosis not present

## 2016-01-01 ENCOUNTER — Ambulatory Visit (INDEPENDENT_AMBULATORY_CARE_PROVIDER_SITE_OTHER): Payer: Medicare Other | Admitting: Family Medicine

## 2016-01-02 ENCOUNTER — Ambulatory Visit (INDEPENDENT_AMBULATORY_CARE_PROVIDER_SITE_OTHER): Payer: Medicare Other | Admitting: Physician Assistant

## 2016-01-02 ENCOUNTER — Encounter: Payer: Self-pay | Admitting: Physician Assistant

## 2016-01-02 VITALS — BP 138/78 | HR 65 | Ht 73.0 in | Wt 337.0 lb

## 2016-01-02 DIAGNOSIS — R030 Elevated blood-pressure reading, without diagnosis of hypertension: Secondary | ICD-10-CM | POA: Diagnosis not present

## 2016-01-02 DIAGNOSIS — IMO0001 Reserved for inherently not codable concepts without codable children: Secondary | ICD-10-CM

## 2016-01-02 MED ORDER — ALPRAZOLAM 0.5 MG PO TABS
ORAL_TABLET | ORAL | Status: DC
Start: 2016-01-02 — End: 2016-12-11

## 2016-01-02 NOTE — Progress Notes (Signed)
Patient has been deemed a "no-show" for today's scheduled appointment.  Based on chief complaint and my chart review:  -Follow-up advised.  Contacting patient and urged to schedule visit in 2-3 weeks.  If necessary this was communicated to the patient via phone or mail on: 5:13 PM 01/02/2016

## 2016-01-03 DIAGNOSIS — R030 Elevated blood-pressure reading, without diagnosis of hypertension: Secondary | ICD-10-CM | POA: Insufficient documentation

## 2016-01-03 NOTE — Progress Notes (Signed)
   Subjective:    Patient ID: Mark [name]., male    DOB: 02-10-1961, 55 y.o.   MRN: [id number]  HPI Patient is a 55 year old male who presents to the clinic for blood pressure issues. 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 He went to have a procedure done his blood pressure was 179/101. He has never been on any medication for blood pressure. He denies any chest pain, palpitations, headaches or dizziness. When he checks his blood pressure outside of the dentist office it is always under 140/90. He does admit to getting very anxious before the procedure.   Review of Systems  All other systems reviewed and are negative.      Objective:   Physical Exam  Constitutional: He is oriented to person, place, and time. He appears well-developed and well-nourished.  Morbid obesity.   HENT:  Head: Normocephalic and atraumatic.  Cardiovascular: Normal rate, regular rhythm and normal heart sounds.   Pulmonary/Chest: Effort normal and breath sounds normal. He has no wheezes.  Neurological: He is alert and oriented to person, place, and time.  Psychiatric: He has a normal mood and affect. His behavior is normal.          Assessment & Plan:  Elevated blood pressure-Blood pressure here in office is under 140/90. I look back at his flowsheet and historically his blood pressures have always been fairly controlled. I do think that could be some anxiety component to the elevation when at the dentist office. I did give him Xanax to take 30 minutes before he arrives at the dentist office hopefully this will help to relax him. Also discussed laying down and having him wait 5 minutes before checking his blood pressure. I went ahead and filled out paperwork for them to do her seizure as long as blood pressure under 165/95.I do not think he needs a daily blood pressure medicine at this time. I discussed with patient that he is obese and his blood pressure  has been borderline at times we could consider this in the future.  Morbid obesity-encouraged patient to consider an exercise plan to help him lose weight. Encouraged patient to follow-up in the next month with PCP to recheck blood pressure and discuss weight loss medications.

## 2016-01-04 DIAGNOSIS — R030 Elevated blood-pressure reading, without diagnosis of hypertension: Secondary | ICD-10-CM | POA: Diagnosis not present

## 2016-01-04 DIAGNOSIS — M5137 Other intervertebral disc degeneration, lumbosacral region: Secondary | ICD-10-CM | POA: Diagnosis not present

## 2016-01-04 DIAGNOSIS — Z6841 Body Mass Index (BMI) 40.0 and over, adult: Secondary | ICD-10-CM | POA: Diagnosis not present

## 2016-01-15 DIAGNOSIS — M79604 Pain in right leg: Secondary | ICD-10-CM | POA: Insufficient documentation

## 2016-01-15 DIAGNOSIS — M79605 Pain in left leg: Secondary | ICD-10-CM | POA: Insufficient documentation

## 2016-01-19 DIAGNOSIS — G4733 Obstructive sleep apnea (adult) (pediatric): Secondary | ICD-10-CM | POA: Diagnosis not present

## 2016-02-13 ENCOUNTER — Encounter: Payer: Self-pay | Admitting: Family Medicine

## 2016-02-13 ENCOUNTER — Ambulatory Visit (INDEPENDENT_AMBULATORY_CARE_PROVIDER_SITE_OTHER): Payer: Medicare Other | Admitting: Family Medicine

## 2016-02-13 VITALS — BP 147/85 | HR 84 | Wt 336.0 lb

## 2016-02-13 DIAGNOSIS — M5416 Radiculopathy, lumbar region: Secondary | ICD-10-CM | POA: Insufficient documentation

## 2016-02-13 DIAGNOSIS — G35 Multiple sclerosis: Secondary | ICD-10-CM | POA: Diagnosis not present

## 2016-02-13 MED ORDER — PREDNISONE 10 MG (48) PO TBPK: ORAL_TABLET | Freq: Every day | ORAL | 0 refills | Status: DC

## 2016-02-13 NOTE — Patient Instructions (Signed)
Thank you for coming in today.  Dr Shelia Media  Wednesday Nov 8th at [facility] 30 mins early.  Come with insurance and medications and photo ID.  You are on the cancellation list as well. You should receive a new patient package in the mail soon. Call [phone] for questions  Contact her neurosurgeon   Take prednisone Dosepak  Come back or go to the emergency room if you notice new weakness new numbness problems walking or bowel or bladder problems.

## 2016-02-13 NOTE — Progress Notes (Signed)
Mark [name]. is a 55 y.o. male who presents to [facility] today for back pain radiating to the left leg. Patient has a complicated past medical history of multiple sclerosis and degenerative disc disease in the low back with occasional nerve impingement. He's been complaining of pain radiating down the left leg for the last several weeks. He's been treated with a short course of prednisone from his neurologist office for a presumptive MS flare. He denies any new weakness or new bowel bladder dysfunction. He notes the pain is quite severe at times. He takes gabapentin Cymbalta and Norco for pain control.   On a related note he would like a second opinion with a different neurologist and is interested in Dr. Shelia Media at [facility]. He would like a referral if possible.   Past Medical History:  Diagnosis Date  . Anxiety    chronic pain treated with narcotics  . Arthritis   . Chronic pain   . Constipation due to opioid therapy   . Depression   . Dysphonia    thoracic left sided"impingement" with heminumbness  . GI bleed    abt 15 years ago  . Morbid obesity ([place])   . MS (multiple sclerosis) ([place]) 02/11/2013   [name]  . MS (multiple sclerosis) ([place])   . Neuromuscular disorder ([place])   . Pneumonia   . Sleep apnea with use of continuous positive airway pressure (CPAP) 02/11/2013   07-13-12 AHi of 98.6 /hr titrated to 11 cm water ,  average user time 4 hours and 4 minutes. Residual AHI 1.20 December 2012 .  Marland Kitchen Umbilical hernia    Past Surgical History:  Procedure Laterality Date  . CARPAL TUNNEL RELEASE Left    [name]  . COLONOSCOPY W/ POLYPECTOMY    . POSTERIOR LUMBAR FUSION  04/17/15   [name], also removed old hardware  . SPINE SURGERY     total of six vertebras fused, three lumbar surgeries and one anterior neck fusion  . TONSILLECTOMY      Social History  Substance Use Topics  . Smoking status: Never Smoker  . Smokeless tobacco: Never Used  . Alcohol use Yes     Comment: occasionally   family history includes Cancer in his other and other; Sleep walking in his son.  ROS as above:  Medications: Current Outpatient Prescriptions  Medication Sig Dispense Refill  . ALPRAZolam (XANAX) 0.5 MG tablet Take 1-2 tablets before procedures. 10 tablet 0  . amphetamine-dextroamphetamine (ADDERALL) 10 MG tablet Take 10 mg by mouth 2 (two) times daily with a meal.    . DULoxetine (CYMBALTA) 60 MG capsule Take 60 mg by mouth 2 (two) times daily.     Marland Kitchen gabapentin (NEURONTIN) 600 MG tablet Take 600 mg by mouth 4 (four) times daily.    Marland Kitchen HYDROcodone-acetaminophen (NORCO) 10-325 MG per tablet Take 2 tablets by mouth every 4 (four) hours as needed for severe pain.     Marland Kitchen ibuprofen (ADVIL,MOTRIN) 800 MG tablet Take 800 mg by mouth every 8 (eight) hours as needed for moderate pain.    . predniSONE (STERAPRED UNI-PAK 48 TAB) 10 MG (48) TBPK tablet Take by mouth daily. 12 day dosepack po 48 tablet 0  . tamsulosin (FLOMAX) 0.4 MG CAPS capsule Take 0.4 mg by mouth daily.    . Teriflunomide (AUBAGIO) 14 MG TABS Take 14 mg by mouth at bedtime. Dr  david meyers    . tiZANidine (ZANAFLEX) 4 MG tablet Take 4 mg by mouth every 6 (six) hours as needed for muscle spasms.      No current facility-administered medications for this visit.    No Known Allergies   Exam:  BP (!) 147/85   Pulse 84  Gen: Well NAD in Pain appearing. Back is nontender. Left leg positive left slump test.  Sensation is intact. Strength is decreased.  Antalgic gait using a cane.   CLINICAL DATA:  L1-2 posterior fusion and L2 through L5 hardware removal.  EXAM: DG C-ARM 61-120 MIN; LUMBAR SPINE - 2-3 VIEW  COMPARISON:  Lumbar spine CT dated 06/28/2008.  FINDINGS: AP an lateral C arm views of the lumbar spine demonstrate placement of interbody metallic spacers  and pedicle screws at 1 level in the lumbar spine. The exact level cannot be determined due to the limited field-of-view. An interbody bone plug is noted at the next more inferior level.  IMPRESSION: Surgical changes, as described above.   CLINICAL DATA:  Spondylolisthesis. Right-sided lower back pain starting 3 weeks ago.  EXAM: MRI LUMBAR SPINE WITHOUT AND WITH CONTRAST  TECHNIQUE: Multiplanar and multiecho pulse sequences of the lumbar spine were obtained without and with intravenous contrast.  CONTRAST:  57mL MULTIHANCE GADOBENATE DIMEGLUMINE 529 MG/ML IV SOLN  COMPARISON:  08/25/2014  FINDINGS: No marrow signal abnormality suggestive of fracture, infection, or neoplasm. Normal conus signal and morphology. No retroperitoneal abnormality to explain back pain. No abnormal clumping or enhancement to suggest arachnoiditis.  Degenerative changes which are superimposed on a congenitally stenotic canal:  T11-12: Bulky facet and ligamentum overgrowth causes chronic spinal canal stenosis with mild to moderate deformation of the posterior cord. Facet effusions present at this level suggesting chronic motion.  T12- L1: Mild to moderate right foraminal stenosis from facet overgrowth.  L1-L2: Advanced spinal canal stenosis from congenitally short pedicles and progressive adjacent segment ligament and facet overgrowth. There is also new T2 hyperintense extradural signal abnormality filling the right canal and extending inferiorly to the mid L2 body level, and potentially extending posteriorly. This is consistent with disc extrusion, signal suggesting recent herniation. Canal and right foraminal stenosis is high-grade.  L2-L3: Discectomy and laminectomy with complete bony fusion by CT. No residual stenosis.  L3-L4: Discectomy and laminectomy with complete bony fusion by CT. No residual stenosis.  L4-L5: Discectomy with wide laminectomy. Complete bony fusion by  CT. No residual stenosis.  L5-S1:Discectomy with complete bony fusion. Hardware has been removed from the left remotely. No residual stenosis.  IMPRESSION: 1. L1-2 new right paracentral disc extrusion with inferior extension and advanced spinal canal stenosis. Short pedicles and advanced adjacent segment posterior element disease contributes to the stenosis. 2. T11-12 chronic spinal canal stenosis from posterior element disease with mild to moderate cord mass effect. 3. L2-S1 fusion with no residual stenosis.   Electronically Signed   By: Monte Fantasia M.D.   On: 02/26/2015 21:16  Electronically Signed   By: Claudie Revering M.D.   On: 04/17/2015 14:01    No results found for this or any previous visit (from the past 24 hour(s)). No results found.    Assessment and Plan: 55 y.o. male with probable left L5 lumbar radiculopathy. Patient has extensive DJD in the lumbar spine. However his history and course is comment located by multiple sclerosis.  I discussed my suspicion with the patient who agrees that is probably lumbar radiculopathy as it is somewhat consistent and hind site with  previous episodes of lumbar radiculopathy. He states that "I I should just go see my neurosurgeon then".   I think that's a reasonable plan. However we'll also prescribe a longer course of prednisone Dosepak. Continue gabapentin Norco and Cymbalta. Discussed warning signs or symptoms.  Additionally we'll refer to Dr. Shelia Media at [facility] neurology   Discussed warning signs or symptoms. Please see discharge instructions. Patient expresses understanding.   CC: METHENEY,CATHERINE, MD

## 2016-02-16 DIAGNOSIS — G35 Multiple sclerosis: Secondary | ICD-10-CM | POA: Diagnosis not present

## 2016-02-16 DIAGNOSIS — Z79899 Other long term (current) drug therapy: Secondary | ICD-10-CM | POA: Diagnosis not present

## 2016-02-16 DIAGNOSIS — M79662 Pain in left lower leg: Secondary | ICD-10-CM | POA: Diagnosis not present

## 2016-02-16 DIAGNOSIS — M79609 Pain in unspecified limb: Secondary | ICD-10-CM | POA: Diagnosis not present

## 2016-02-23 DIAGNOSIS — R030 Elevated blood-pressure reading, without diagnosis of hypertension: Secondary | ICD-10-CM | POA: Diagnosis not present

## 2016-02-23 DIAGNOSIS — Z6841 Body Mass Index (BMI) 40.0 and over, adult: Secondary | ICD-10-CM | POA: Diagnosis not present

## 2016-02-23 DIAGNOSIS — G992 Myelopathy in diseases classified elsewhere: Secondary | ICD-10-CM | POA: Diagnosis not present

## 2016-03-05 ENCOUNTER — Other Ambulatory Visit: Payer: Self-pay | Admitting: Neurosurgery

## 2016-03-05 DIAGNOSIS — G992 Myelopathy in diseases classified elsewhere: Secondary | ICD-10-CM

## 2016-03-13 ENCOUNTER — Emergency Department
Admission: EM | Admit: 2016-03-13 | Discharge: 2016-03-13 | Disposition: A | Discharge status: Home / Self Care | Payer: Medicare Other | Source: Home / Self Care | Attending: Family Medicine | Admitting: Family Medicine

## 2016-03-13 ENCOUNTER — Encounter: Payer: Self-pay | Admitting: *Deleted

## 2016-03-13 DIAGNOSIS — Z0100 Encounter for examination of eyes and vision without abnormal findings: Secondary | ICD-10-CM | POA: Diagnosis not present

## 2016-03-13 NOTE — ED Triage Notes (Signed)
Pt reports getting a foreign body in his RT eye 3 days ago. He is having an MRI tomorrow and wanted to make sure he didn't have anything metal in his eye.

## 2016-03-13 NOTE — ED Provider Notes (Signed)
[facility]    CSN: [id number] Arrival date & time: 03/13/16  [id number]  First Provider Contact:  First MD Initiated Contact with Patient 03/13/16 1955        History   Chief Complaint Chief Complaint  Patient presents with  . Eye Problem    HPI Mark [name]. is a 55 y.o. male.   Patient complains of two day history of vague foreign body sensation in his right eye without changes in vision.  He is concerned because he will be undergoing MRI tomorrow, and he wants to ensure that there is no metallic foreign body in his right eye.   The history is provided by the patient.  Eye Problem  Location:  Right eye Quality:  Foreign body sensation Severity:  Mild Onset quality:  Gradual Duration:  2 days Timing:  Constant Progression:  Improving Chronicity:  New Context: scratch   Context: not burn, not chemical exposure, not contact lens problem, not direct trauma, not foreign body, not using machinery, not smoke exposure and not UV exposure   Relieved by:  None tried Worsened by:  Nothing Ineffective treatments:  None tried Associated symptoms: no blurred vision, no crusting, no decreased vision, no discharge, no double vision, no facial rash, no headaches, no inflammation, no itching, no photophobia, no redness, no scotomas, no swelling and no tearing   Risk factors: no recent URI     Past Medical History:  Diagnosis Date  . Anxiety    chronic pain treated with narcotics  . Arthritis   . Chronic pain   . Constipation due to opioid therapy   . Depression   . Dysphonia    thoracic left sided"impingement" with heminumbness  . GI bleed    abt 15 years ago  . Morbid obesity ([place])   . MS (multiple sclerosis) ([place]) 02/11/2013   [name]  . MS (multiple sclerosis) ([place])   . Neuromuscular disorder ([place])   . Pneumonia   . Sleep apnea with use of continuous positive airway pressure (CPAP) 02/11/2013   07-13-12 AHi of 98.6 /hr titrated to 11 cm  water ,  average user time 4 hours and 4 minutes. Residual AHI 1.20 December 2012 .  Marland Kitchen Umbilical hernia     Patient Active Problem List   Diagnosis Date Noted  . Left lumbar radiculitis 02/13/2016  . Morbid obesity ([place]) 01/03/2016  . Elevated blood pressure 01/03/2016  . Cough 10/02/2015  . HNP (herniated nucleus pulposus), lumbar 04/17/2015  . Hearing loss 12/13/2014  . OSA (obstructive sleep apnea) 11/23/2014  . Severe obesity (BMI >= 40) ([place]) 11/23/2014  . 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 Neuropathy ([place]) 10/25/2013  . Obstructive apnea 10/25/2013  . Cervical osteoarthritis 09/15/2013  . Degenerative arthritis of lumbar spine 09/15/2013  . Adiposity 09/15/2013  . Benign neoplasm of soft tissues of abdomen 09/15/2013  . MS (multiple sclerosis) ([place]) 02/11/2013  . Sleep apnea with use of continuous positive airway pressure (CPAP) 02/11/2013  . DEPRESSION 08/18/2007  . UNSPECIFIED VENOUS INSUFFICIENCY 08/18/2007  . BACK PAIN, CHRONIC 08/18/2007  . NEUROGENIC BLADDER 08/10/2007  . LEG EDEMA, BILATERAL 08/03/2007    Past Surgical History:  Procedure Laterality Date  . CARPAL TUNNEL RELEASE Left    [name]  . COLONOSCOPY W/ POLYPECTOMY    . POSTERIOR LUMBAR FUSION  04/17/15   [name], also removed old hardware  . SPINE SURGERY     total of six vertebras fused, three lumbar  surgeries and one anterior neck fusion  . TONSILLECTOMY         Home Medications    Prior to Admission medications   Medication Sig Start Date End Date Taking? Authorizing Provider  ALPRAZolam Duanne Moron) 0.5 MG tablet Take 1-2 tablets before procedures. 01/02/16   Jade L Breeback, PA-C  amphetamine-dextroamphetamine (ADDERALL) 10 MG tablet Take 10 mg by mouth 2 (two) times daily with a meal.    Historical Provider, MD  DULoxetine (CYMBALTA) 60 MG capsule Take 60 mg by mouth 2 (two) times daily.  01/25/13   Historical Provider, MD  gabapentin (NEURONTIN) 600 MG tablet Take 600 mg by mouth 4 (four)  times daily.    Historical Provider, MD  HYDROcodone-acetaminophen (NORCO) 10-325 MG per tablet Take 2 tablets by mouth every 4 (four) hours as needed for severe pain.  02/01/13   Historical Provider, MD  ibuprofen (ADVIL,MOTRIN) 800 MG tablet Take 800 mg by mouth every 8 (eight) hours as needed for moderate pain.    Historical Provider, MD  tamsulosin (FLOMAX) 0.4 MG CAPS capsule Take 0.4 mg by mouth daily.    Historical Provider, MD  Teriflunomide (AUBAGIO) 14 MG TABS Take 14 mg by mouth at bedtime. Dr Rae Mar    Historical Provider, MD  tiZANidine (ZANAFLEX) 4 MG tablet Take 4 mg by mouth every 6 (six) hours as needed for muscle spasms.     Historical Provider, MD    Family History Family History  Problem Relation Age of Onset  . Cancer Other   . Cancer Other   . Sleep walking Son     Social History Social History  Substance Use Topics  . Smoking status: Never Smoker  . Smokeless tobacco: Never Used  . Alcohol use Yes     Comment: occasionally     Allergies   Review of patient's allergies indicates no known allergies.   Review of Systems Review of Systems  Eyes: Negative for blurred vision, double vision, photophobia, discharge, redness and itching.  Neurological: Negative for headaches.  All other systems reviewed and are negative.    Physical Exam Triage Vital Signs ED Triage Vitals  Enc Vitals Group     BP 03/13/16 1954 152/79     Pulse Rate 03/13/16 1954 73     Resp 03/13/16 1954 18     Temp 03/13/16 1954 98.1 F (36.7 C)     Temp Source 03/13/16 1954 Oral     SpO2 03/13/16 1954 96 %     Weight 03/13/16 1955 (!) 338 lb (153.3 kg)     Height 03/13/16 1955 6\' 1"  (1.854 m)     Head Circumference --      Peak Flow --      Pain Score 03/13/16 1956 3     Pain Loc --      Pain Edu? --      Excl. in [place]? --    No data found.   Updated Vital Signs BP 152/79 (BP Location: Left Arm)   Pulse 73   Temp 98.1 F (36.7 C) (Oral)   Resp 18   Ht 6\' 1"  (1.854  m)   Wt (!) 338 lb (153.3 kg)   SpO2 96%   BMI 44.59 kg/m   Visual Acuity Right Eye Distance: 20/20 Left Eye Distance: 20/30 Bilateral Distance: 20/20 (w/o correction)  Right Eye Near:   Left Eye Near:    Bilateral Near:     Physical Exam  Constitutional: He appears well-developed and well-nourished.  No distress.  HENT:  Head: Atraumatic.  Nose: Nose normal.  Mouth/Throat: Oropharynx is clear and moist.  Eyes: Conjunctivae, EOM and lids are normal. Pupils are equal, round, and reactive to light. Lids are everted and swept, no foreign bodies found. Right eye exhibits no chemosis, no discharge, no exudate and no hordeolum. No foreign body present in the right eye. Left eye exhibits no chemosis, no discharge, no exudate and no hordeolum. No foreign body present in the left eye.  Fluorescein to right eye reveals no foreign body or corneal abrasion  Nursing note and vitals reviewed.    UC Treatments / Results  Labs (all labs ordered are listed, but only abnormal results are displayed) Labs Reviewed - No data to display  EKG  EKG Interpretation None       Radiology No results found.  Procedures Procedures (including critical care time)  Medications Ordered in UC Medications - No data to display   Initial Impression / Assessment and Plan / UC Course  I have reviewed the triage vital signs and the nursing notes.  Pertinent labs & imaging results that were available during my care of the patient were reviewed by me and considered in my medical decision making (see chart for details).  Clinical Course   Reassurance:  Eye exam reveals no evidence foreign body or corneal abrasion.     Final Clinical Impressions(s) / UC Diagnoses   Final diagnoses:  Encounter for eye exam    New Prescriptions New Prescriptions   No medications on file     Kandra Nicolas, MD 03/13/16 2014

## 2016-03-14 ENCOUNTER — Ambulatory Visit
Admission: RE | Admit: 2016-03-14 | Discharge: 2016-03-14 | Disposition: A | Discharge status: Home / Self Care | Payer: Medicare Other | Source: Ambulatory Visit | Attending: Neurosurgery | Admitting: Neurosurgery

## 2016-03-14 DIAGNOSIS — M4804 Spinal stenosis, thoracic region: Secondary | ICD-10-CM | POA: Diagnosis not present

## 2016-03-14 DIAGNOSIS — G992 Myelopathy in diseases classified elsewhere: Secondary | ICD-10-CM

## 2016-03-14 DIAGNOSIS — M4802 Spinal stenosis, cervical region: Secondary | ICD-10-CM | POA: Diagnosis not present

## 2016-03-14 MED ORDER — GADOBENATE DIMEGLUMINE 529 MG/ML IV SOLN
20.0000 mL | Freq: Once | INTRAVENOUS | Status: AC | PRN
  Administered 2016-03-14: 20 mL via INTRAVENOUS

## 2016-03-19 DIAGNOSIS — M79605 Pain in left leg: Secondary | ICD-10-CM | POA: Diagnosis not present

## 2016-03-19 DIAGNOSIS — G35 Multiple sclerosis: Secondary | ICD-10-CM | POA: Diagnosis not present

## 2016-03-19 DIAGNOSIS — G8381 Brown-Sequard syndrome: Secondary | ICD-10-CM | POA: Diagnosis not present

## 2016-03-19 DIAGNOSIS — M79604 Pain in right leg: Secondary | ICD-10-CM | POA: Diagnosis not present

## 2016-03-19 DIAGNOSIS — Z79899 Other long term (current) drug therapy: Secondary | ICD-10-CM | POA: Diagnosis not present

## 2016-03-19 DIAGNOSIS — G629 Polyneuropathy, unspecified: Secondary | ICD-10-CM | POA: Diagnosis not present

## 2016-03-20 DIAGNOSIS — G8220 Paraplegia, unspecified: Secondary | ICD-10-CM | POA: Insufficient documentation

## 2016-04-04 DIAGNOSIS — R030 Elevated blood-pressure reading, without diagnosis of hypertension: Secondary | ICD-10-CM | POA: Diagnosis not present

## 2016-04-04 DIAGNOSIS — Z79899 Other long term (current) drug therapy: Secondary | ICD-10-CM | POA: Diagnosis not present

## 2016-04-04 DIAGNOSIS — Z5181 Encounter for therapeutic drug level monitoring: Secondary | ICD-10-CM | POA: Diagnosis not present

## 2016-04-04 DIAGNOSIS — M5023 Other cervical disc displacement, cervicothoracic region: Secondary | ICD-10-CM | POA: Diagnosis not present

## 2016-04-04 DIAGNOSIS — Z6841 Body Mass Index (BMI) 40.0 and over, adult: Secondary | ICD-10-CM | POA: Diagnosis not present

## 2016-04-04 DIAGNOSIS — M542 Cervicalgia: Secondary | ICD-10-CM | POA: Diagnosis not present

## 2016-04-09 DIAGNOSIS — M4802 Spinal stenosis, cervical region: Secondary | ICD-10-CM | POA: Diagnosis not present

## 2016-04-09 DIAGNOSIS — M4804 Spinal stenosis, thoracic region: Secondary | ICD-10-CM | POA: Diagnosis not present

## 2016-04-09 DIAGNOSIS — G35 Multiple sclerosis: Secondary | ICD-10-CM | POA: Diagnosis not present

## 2016-04-16 ENCOUNTER — Other Ambulatory Visit: Payer: Self-pay | Admitting: Neurosurgery

## 2016-04-16 DIAGNOSIS — M5023 Other cervical disc displacement, cervicothoracic region: Secondary | ICD-10-CM

## 2016-04-20 ENCOUNTER — Other Ambulatory Visit: Payer: Medicare Other

## 2016-04-22 ENCOUNTER — Ambulatory Visit
Admission: RE | Admit: 2016-04-22 | Discharge: 2016-04-22 | Disposition: A | Discharge status: Home / Self Care | Payer: Medicare Other | Source: Ambulatory Visit | Attending: Neurosurgery | Admitting: Neurosurgery

## 2016-04-22 DIAGNOSIS — M4326 Fusion of spine, lumbar region: Secondary | ICD-10-CM | POA: Diagnosis not present

## 2016-04-22 DIAGNOSIS — M5023 Other cervical disc displacement, cervicothoracic region: Secondary | ICD-10-CM

## 2016-04-23 DIAGNOSIS — Z6841 Body Mass Index (BMI) 40.0 and over, adult: Secondary | ICD-10-CM | POA: Diagnosis not present

## 2016-04-23 DIAGNOSIS — M5137 Other intervertebral disc degeneration, lumbosacral region: Secondary | ICD-10-CM | POA: Diagnosis not present

## 2016-04-23 DIAGNOSIS — R030 Elevated blood-pressure reading, without diagnosis of hypertension: Secondary | ICD-10-CM | POA: Diagnosis not present

## 2016-04-24 ENCOUNTER — Other Ambulatory Visit: Payer: Self-pay | Admitting: Neurosurgery

## 2016-04-24 DIAGNOSIS — M5023 Other cervical disc displacement, cervicothoracic region: Secondary | ICD-10-CM

## 2016-04-29 ENCOUNTER — Other Ambulatory Visit: Payer: Self-pay | Admitting: Neurosurgery

## 2016-05-01 DIAGNOSIS — G4733 Obstructive sleep apnea (adult) (pediatric): Secondary | ICD-10-CM | POA: Diagnosis not present

## 2016-05-13 ENCOUNTER — Other Ambulatory Visit: Payer: Medicare Other

## 2016-05-14 ENCOUNTER — Encounter (HOSPITAL_COMMUNITY): Payer: Self-pay

## 2016-05-14 ENCOUNTER — Encounter (HOSPITAL_COMMUNITY)
Admission: RE | Admit: 2016-05-14 | Discharge: 2016-05-14 | Disposition: A | Discharge status: Home / Self Care | Payer: Medicare Other | Source: Ambulatory Visit | Attending: Neurosurgery | Admitting: Neurosurgery

## 2016-05-14 DIAGNOSIS — Z6841 Body Mass Index (BMI) 40.0 and over, adult: Secondary | ICD-10-CM | POA: Diagnosis not present

## 2016-05-14 DIAGNOSIS — F419 Anxiety disorder, unspecified: Secondary | ICD-10-CM | POA: Diagnosis not present

## 2016-05-14 DIAGNOSIS — G9520 Unspecified cord compression: Secondary | ICD-10-CM | POA: Diagnosis not present

## 2016-05-14 DIAGNOSIS — G4730 Sleep apnea, unspecified: Secondary | ICD-10-CM | POA: Diagnosis not present

## 2016-05-14 DIAGNOSIS — M4805 Spinal stenosis, thoracolumbar region: Secondary | ICD-10-CM | POA: Diagnosis not present

## 2016-05-14 DIAGNOSIS — G35 Multiple sclerosis: Secondary | ICD-10-CM | POA: Diagnosis not present

## 2016-05-14 DIAGNOSIS — F329 Major depressive disorder, single episode, unspecified: Secondary | ICD-10-CM | POA: Diagnosis not present

## 2016-05-14 DIAGNOSIS — G8929 Other chronic pain: Secondary | ICD-10-CM | POA: Diagnosis not present

## 2016-05-14 DIAGNOSIS — Z981 Arthrodesis status: Secondary | ICD-10-CM | POA: Diagnosis not present

## 2016-05-14 LAB — CBC
HEMATOCRIT: 40.5 % (ref 39.0–52.0)
HEMOGLOBIN: 13.6 g/dL (ref 13.0–17.0)
MCH: 29.1 pg (ref 26.0–34.0)
MCHC: 33.6 g/dL (ref 30.0–36.0)
MCV: 86.5 fL (ref 78.0–100.0)
Platelets: 250 10*3/uL (ref 150–400)
RBC: 4.68 MIL/uL (ref 4.22–5.81)
RDW: 13 % (ref 11.5–15.5)
WBC: 8.4 10*3/uL (ref 4.0–10.5)

## 2016-05-14 LAB — BASIC METABOLIC PANEL
ANION GAP: 6 (ref 5–15)
BUN: 21 mg/dL — ABNORMAL HIGH (ref 6–20)
CHLORIDE: 104 mmol/L (ref 101–111)
CO2: 28 mmol/L (ref 22–32)
Calcium: 9.3 mg/dL (ref 8.9–10.3)
Creatinine, Ser: 0.79 mg/dL (ref 0.61–1.24)
GFR calc non Af Amer: >60 mL/min (ref >60)
Glucose, Bld: 103 mg/dL — ABNORMAL HIGH (ref 65–99)
POTASSIUM: 3.8 mmol/L (ref 3.5–5.1)
SODIUM: 138 mmol/L (ref 135–145)

## 2016-05-14 LAB — SURGICAL PCR SCREEN
MRSA, PCR: NEGATIVE
STAPHYLOCOCCUS AUREUS: NEGATIVE

## 2016-05-14 LAB — TYPE AND SCREEN
ABO/RH(D): O NEG
ANTIBODY SCREEN: NEGATIVE

## 2016-05-14 NOTE — Pre-Procedure Instructions (Signed)
Mark Albert Shiller Jr.  05/14/2016      [place], [place] - [id number] [place] [address] Phone: [phone] Fax: [phone]    Your procedure is scheduled on   Friday  05/17/16  Report to [facility] Admitting at 740 A.M.  Call this number if you have problems the morning of surgery:  [phone]   Remember:  Do not eat food or drink liquids after midnight.  Take these medicines the morning of surgery with A SIP OF WATER   DULOXETINE (CYMBALTA), GABAPENTIN, HYDROCODONE IF NEEDED   (STOP NOW TAKING ASPIRIN OR ASPIRIN PRODUCTS, IBUPROFEN/ ADVIL/ MOTRIN, MOBIC/ MELOXICAM, GOODY POWDERS, BC'S, HERBAL MEDICINES)   Do not wear jewelry, make-up or nail polish.  Do not wear lotions, powders, or perfumes, or deoderant.  Do not shave 48 hours prior to surgery.  Men may shave face and neck.  Do not bring valuables to the hospital.  [facility] is not responsible for any belongings or valuables.  Contacts, dentures or bridgework may not be worn into surgery.  Leave your suitcase in the car.  After surgery it may be brought to your room.  For patients admitted to the hospital, discharge time will be determined by your treatment team.  Patients discharged the day of surgery will not be allowed to drive home.   Name and phone number of your driver:    Special instructions:  [facility] - Preparing for Surgery  Before surgery, you can play an important role.  Because skin is not sterile, your skin needs to be as free of germs as possible.  You can reduce the number of germs on you skin by washing with CHG (chlorahexidine gluconate) soap before surgery.  CHG is an antiseptic cleaner which kills germs and bonds with the skin to continue killing germs even after washing.  Please DO NOT use if you have an allergy to CHG or antibacterial soaps.  If your skin becomes reddened/irritated stop using the CHG and inform your nurse when  you arrive at Short Stay.  Do not shave (including legs and underarms) for at least 48 hours prior to the first CHG shower.  You may shave your face.  Please follow these instructions carefully:   1.  Shower with CHG Soap the night before surgery and the                                morning of Surgery.  2.  If you choose to wash your hair, wash your hair first as usual with your       normal shampoo.  3.  After you shampoo, rinse your hair and body thoroughly to remove the                      Shampoo.  4.  Use CHG as you would any other liquid soap.  You can apply chg directly       to the skin and wash gently with scrungie or a clean washcloth.  5.  Apply the CHG Soap to your body ONLY FROM THE NECK DOWN.        Do not use on open wounds or open sores.  Avoid contact with your eyes,       ears, mouth and genitals (private parts).  Wash genitals (private parts)       with your  normal soap.  6.  Wash thoroughly, paying special attention to the area where your surgery        will be performed.  7.  Thoroughly rinse your body with warm water from the neck down.  8.  DO NOT shower/wash with your normal soap after using and rinsing off       the CHG Soap.  9.  Pat yourself dry with a clean towel.            10.  Wear clean pajamas.            11.  Place clean sheets on your bed the night of your first shower and do not        sleep with pets.  Day of Surgery  Do not apply any lotions/deoderants the morning of surgery.  Please wear clean clothes to the hospital/surgery center.    Please read over the following fact sheets that you were given. MRSA Information and Surgical Site Infection Prevention

## 2016-05-14 NOTE — Progress Notes (Signed)
Called PCP (DR. Lynne Leader [phone]) LEFT MESSAGE WITH EBONY TO FAX STRESS TEST  OR ECHO , EKG, OV )   PATIENTS WIFE WAS NOT SURE WHAT WAS DONE MAYBE ECHO OR STRESS TEST ?2014 [place].

## 2016-05-16 MED ORDER — DEXTROSE 5 % IV SOLN
3.0000 g | INTRAVENOUS | Status: AC
  Administered 2016-05-17 (×2): 3 g via INTRAVENOUS
  Filled 2016-05-16: qty 3000

## 2016-05-16 NOTE — Progress Notes (Signed)
Left message for dr. Amalia Hailey office to fax ? Echo or ? Stress test if they have.

## 2016-05-17 ENCOUNTER — Inpatient Hospital Stay (HOSPITAL_COMMUNITY): Payer: Medicare Other

## 2016-05-17 ENCOUNTER — Encounter (HOSPITAL_COMMUNITY): Payer: Self-pay | Admitting: Anesthesiology

## 2016-05-17 ENCOUNTER — Inpatient Hospital Stay (HOSPITAL_COMMUNITY): Payer: Medicare Other | Admitting: Anesthesiology

## 2016-05-17 ENCOUNTER — Encounter (HOSPITAL_COMMUNITY)
Admission: RE | Disposition: A | Discharge status: Home / Self Care | Payer: Self-pay | Source: Ambulatory Visit | Attending: Neurosurgery

## 2016-05-17 ENCOUNTER — Inpatient Hospital Stay (HOSPITAL_COMMUNITY)
Admission: RE | Admit: 2016-05-17 | Discharge: 2016-05-19 | DRG: 460 | Disposition: A | Discharge status: Home / Self Care | Payer: Medicare Other | Source: Ambulatory Visit | Attending: Neurosurgery | Admitting: Neurosurgery

## 2016-05-17 DIAGNOSIS — F329 Major depressive disorder, single episode, unspecified: Secondary | ICD-10-CM | POA: Diagnosis present

## 2016-05-17 DIAGNOSIS — G35 Multiple sclerosis: Secondary | ICD-10-CM | POA: Diagnosis not present

## 2016-05-17 DIAGNOSIS — M5136 Other intervertebral disc degeneration, lumbar region: Secondary | ICD-10-CM

## 2016-05-17 DIAGNOSIS — Z79899 Other long term (current) drug therapy: Secondary | ICD-10-CM

## 2016-05-17 DIAGNOSIS — M4804 Spinal stenosis, thoracic region: Secondary | ICD-10-CM | POA: Diagnosis present

## 2016-05-17 DIAGNOSIS — M546 Pain in thoracic spine: Secondary | ICD-10-CM | POA: Diagnosis present

## 2016-05-17 DIAGNOSIS — G9520 Unspecified cord compression: Secondary | ICD-10-CM | POA: Diagnosis present

## 2016-05-17 DIAGNOSIS — G8929 Other chronic pain: Secondary | ICD-10-CM | POA: Diagnosis present

## 2016-05-17 DIAGNOSIS — G959 Disease of spinal cord, unspecified: Secondary | ICD-10-CM | POA: Diagnosis not present

## 2016-05-17 DIAGNOSIS — Z981 Arthrodesis status: Secondary | ICD-10-CM | POA: Diagnosis not present

## 2016-05-17 DIAGNOSIS — F419 Anxiety disorder, unspecified: Secondary | ICD-10-CM | POA: Diagnosis not present

## 2016-05-17 DIAGNOSIS — G4730 Sleep apnea, unspecified: Secondary | ICD-10-CM | POA: Diagnosis present

## 2016-05-17 DIAGNOSIS — Z6841 Body Mass Index (BMI) 40.0 and over, adult: Secondary | ICD-10-CM

## 2016-05-17 DIAGNOSIS — M4805 Spinal stenosis, thoracolumbar region: Secondary | ICD-10-CM | POA: Diagnosis not present

## 2016-05-17 DIAGNOSIS — M1990 Unspecified osteoarthritis, unspecified site: Secondary | ICD-10-CM | POA: Diagnosis not present

## 2016-05-17 DIAGNOSIS — Z472 Encounter for removal of internal fixation device: Secondary | ICD-10-CM | POA: Diagnosis not present

## 2016-05-17 SURGERY — POSTERIOR LUMBAR FUSION 3 LEVEL
Anesthesia: General | Site: Back

## 2016-05-17 MED ORDER — HYDROMORPHONE HCL 2 MG/ML IJ SOLN
INTRAMUSCULAR | Status: AC
  Filled 2016-05-17: qty 1

## 2016-05-17 MED ORDER — TIZANIDINE HCL 4 MG PO TABS
4.0000 mg | ORAL_TABLET | Freq: Four times a day (QID) | ORAL | Status: DC
  Administered 2016-05-17 – 2016-05-19 (×6): 4 mg via ORAL
  Filled 2016-05-17 (×6): qty 1

## 2016-05-17 MED ORDER — LIDOCAINE-EPINEPHRINE 1 %-1:100000 IJ SOLN
INTRAMUSCULAR | Status: AC
  Filled 2016-05-17: qty 1

## 2016-05-17 MED ORDER — SODIUM CHLORIDE 0.9 % IV SOLN: 250.0000 mL | INTRAVENOUS | Status: DC

## 2016-05-17 MED ORDER — PROPOFOL 10 MG/ML IV BOLUS
INTRAVENOUS | Status: AC
  Filled 2016-05-17: qty 20

## 2016-05-17 MED ORDER — DULOXETINE HCL 60 MG PO CPEP
60.0000 mg | ORAL_CAPSULE | Freq: Two times a day (BID) | ORAL | Status: DC
  Administered 2016-05-17 – 2016-05-19 (×4): 60 mg via ORAL
  Filled 2016-05-17 (×4): qty 1

## 2016-05-17 MED ORDER — CHLORHEXIDINE GLUCONATE CLOTH 2 % EX PADS: 6.0000 | MEDICATED_PAD | Freq: Once | CUTANEOUS | Status: DC

## 2016-05-17 MED ORDER — CEFAZOLIN SODIUM 1 G IJ SOLR
INTRAMUSCULAR | Status: AC
  Filled 2016-05-17: qty 30

## 2016-05-17 MED ORDER — 0.9 % SODIUM CHLORIDE (POUR BTL) OPTIME
TOPICAL | Status: DC | PRN
  Administered 2016-05-17: 1000 mL

## 2016-05-17 MED ORDER — THROMBIN 5000 UNITS EX SOLR
CUTANEOUS | Status: DC | PRN
  Administered 2016-05-17: 13:00:00 via TOPICAL

## 2016-05-17 MED ORDER — TAMSULOSIN HCL 0.4 MG PO CAPS
0.4000 mg | ORAL_CAPSULE | Freq: Every day | ORAL | Status: DC
  Administered 2016-05-17 – 2016-05-18 (×2): 0.4 mg via ORAL
  Filled 2016-05-17 (×2): qty 1

## 2016-05-17 MED ORDER — GABAPENTIN 400 MG PO CAPS
800.0000 mg | ORAL_CAPSULE | Freq: Four times a day (QID) | ORAL | Status: DC
  Administered 2016-05-17 – 2016-05-19 (×6): 800 mg via ORAL
  Filled 2016-05-17 (×7): qty 2

## 2016-05-17 MED ORDER — DEXAMETHASONE SODIUM PHOSPHATE 10 MG/ML IJ SOLN
10.0000 mg | INTRAMUSCULAR | Status: DC
  Filled 2016-05-17: qty 1

## 2016-05-17 MED ORDER — ALPRAZOLAM 0.5 MG PO TABS: 0.5000 mg | ORAL_TABLET | ORAL | Status: DC

## 2016-05-17 MED ORDER — METHYLPREDNISOLONE 4 MG PO TBPK: 4.0000 mg | ORAL_TABLET | ORAL | Status: DC

## 2016-05-17 MED ORDER — OXYCODONE-ACETAMINOPHEN 5-325 MG PO TABS
ORAL_TABLET | ORAL | Status: AC
  Filled 2016-05-17: qty 2

## 2016-05-17 MED ORDER — SURGIFOAM 100 EX MISC
CUTANEOUS | Status: DC | PRN
  Administered 2016-05-17: 11:00:00 via TOPICAL

## 2016-05-17 MED ORDER — PHENOL 1.4 % MT LIQD: 1.0000 | OROMUCOSAL | Status: DC | PRN

## 2016-05-17 MED ORDER — SODIUM CHLORIDE 0.9 % IR SOLN
Status: DC | PRN
  Administered 2016-05-17 (×2)

## 2016-05-17 MED ORDER — HYDROMORPHONE HCL 1 MG/ML IJ SOLN
0.2500 mg | INTRAMUSCULAR | Status: DC | PRN
  Administered 2016-05-17 (×4): 0.5 mg via INTRAVENOUS

## 2016-05-17 MED ORDER — THROMBIN 5000 UNITS EX SOLR
CUTANEOUS | Status: AC
  Filled 2016-05-17: qty 5000

## 2016-05-17 MED ORDER — SODIUM CHLORIDE 0.9% FLUSH: 3.0000 mL | INTRAVENOUS | Status: DC | PRN

## 2016-05-17 MED ORDER — FENTANYL CITRATE (PF) 100 MCG/2ML IJ SOLN
INTRAMUSCULAR | Status: AC
  Filled 2016-05-17: qty 2

## 2016-05-17 MED ORDER — FENTANYL CITRATE (PF) 100 MCG/2ML IJ SOLN
INTRAMUSCULAR | Status: DC | PRN
  Administered 2016-05-17 (×2): 100 ug via INTRAVENOUS
  Administered 2016-05-17 (×2): 50 ug via INTRAVENOUS
  Administered 2016-05-17: 100 ug via INTRAVENOUS
  Administered 2016-05-17: 25 ug via INTRAVENOUS
  Administered 2016-05-17 (×3): 50 ug via INTRAVENOUS
  Administered 2016-05-17: 25 ug via INTRAVENOUS

## 2016-05-17 MED ORDER — BUPIVACAINE HCL (PF) 0.25 % IJ SOLN
INTRAMUSCULAR | Status: AC
  Filled 2016-05-17: qty 30

## 2016-05-17 MED ORDER — SODIUM CHLORIDE 0.9% FLUSH
3.0000 mL | Freq: Two times a day (BID) | INTRAVENOUS | Status: DC
  Administered 2016-05-18: 3 mL via INTRAVENOUS

## 2016-05-17 MED ORDER — VANCOMYCIN HCL 1000 MG IV SOLR
INTRAVENOUS | Status: AC
  Filled 2016-05-17: qty 1000

## 2016-05-17 MED ORDER — PHENYLEPHRINE HCL 10 MG/ML IJ SOLN
INTRAVENOUS | Status: DC | PRN
  Administered 2016-05-17: 75 ug/min via INTRAVENOUS
  Administered 2016-05-17: 100 ug/min via INTRAVENOUS

## 2016-05-17 MED ORDER — ONDANSETRON HCL 4 MG/2ML IJ SOLN
INTRAMUSCULAR | Status: AC
  Filled 2016-05-17: qty 2

## 2016-05-17 MED ORDER — ROCURONIUM BROMIDE 100 MG/10ML IV SOLN
INTRAVENOUS | Status: DC | PRN
  Administered 2016-05-17 (×2): 10 mg via INTRAVENOUS
  Administered 2016-05-17: 20 mg via INTRAVENOUS
  Administered 2016-05-17: 60 mg via INTRAVENOUS
  Administered 2016-05-17 (×2): 10 mg via INTRAVENOUS
  Administered 2016-05-17 (×2): 20 mg via INTRAVENOUS

## 2016-05-17 MED ORDER — ONDANSETRON HCL 4 MG/2ML IJ SOLN
INTRAMUSCULAR | Status: DC | PRN
  Administered 2016-05-17 (×2): 4 mg via INTRAVENOUS

## 2016-05-17 MED ORDER — DEXTROSE 5 % IV SOLN
3.0000 g | Freq: Three times a day (TID) | INTRAVENOUS | Status: DC
  Administered 2016-05-17 – 2016-05-19 (×5): 3 g via INTRAVENOUS
  Filled 2016-05-17 (×6): qty 3000

## 2016-05-17 MED ORDER — AMPHETAMINE-DEXTROAMPHET ER 5 MG PO CP24
15.0000 mg | ORAL_CAPSULE | Freq: Two times a day (BID) | ORAL | Status: DC
  Administered 2016-05-18 – 2016-05-19 (×3): 15 mg via ORAL
  Filled 2016-05-17 (×3): qty 1

## 2016-05-17 MED ORDER — LACTATED RINGERS IV SOLN
INTRAVENOUS | Status: DC
  Administered 2016-05-17 (×2): via INTRAVENOUS
  Administered 2016-05-17: 50 mL/h via INTRAVENOUS
  Administered 2016-05-17: 14:00:00 via INTRAVENOUS

## 2016-05-17 MED ORDER — LIDOCAINE HCL (CARDIAC) 20 MG/ML IV SOLN
INTRAVENOUS | Status: DC | PRN
  Administered 2016-05-17: 80 mg via INTRAVENOUS

## 2016-05-17 MED ORDER — DOCUSATE SODIUM 100 MG PO CAPS
100.0000 mg | ORAL_CAPSULE | Freq: Two times a day (BID) | ORAL | Status: DC
  Administered 2016-05-17 – 2016-05-19 (×4): 100 mg via ORAL
  Filled 2016-05-17 (×4): qty 1

## 2016-05-17 MED ORDER — OXYCODONE-ACETAMINOPHEN 5-325 MG PO TABS
1.0000 | ORAL_TABLET | ORAL | Status: DC | PRN
  Administered 2016-05-17 – 2016-05-18 (×5): 2 via ORAL
  Administered 2016-05-19: 1 via ORAL
  Administered 2016-05-19 (×2): 2 via ORAL
  Filled 2016-05-17 (×7): qty 2

## 2016-05-17 MED ORDER — BUPIVACAINE LIPOSOME 1.3 % IJ SUSP
20.0000 mL | INTRAMUSCULAR | Status: DC
  Filled 2016-05-17: qty 20

## 2016-05-17 MED ORDER — ALBUMIN HUMAN 5 % IV SOLN
INTRAVENOUS | Status: DC | PRN
  Administered 2016-05-17: 12:00:00 via INTRAVENOUS

## 2016-05-17 MED ORDER — BUPIVACAINE LIPOSOME 1.3 % IJ SUSP
INTRAMUSCULAR | Status: DC | PRN
  Administered 2016-05-17: 20 mL

## 2016-05-17 MED ORDER — PROPOFOL 10 MG/ML IV BOLUS
INTRAVENOUS | Status: DC | PRN
  Administered 2016-05-17: 200 mg via INTRAVENOUS
  Administered 2016-05-17: 30 mg via INTRAVENOUS

## 2016-05-17 MED ORDER — ACETAMINOPHEN 325 MG PO TABS: 650.0000 mg | ORAL_TABLET | ORAL | Status: DC | PRN

## 2016-05-17 MED ORDER — FENTANYL CITRATE (PF) 100 MCG/2ML IJ SOLN
INTRAMUSCULAR | Status: AC
Start: 2016-05-17 — End: 2016-05-17
  Filled 2016-05-17: qty 2

## 2016-05-17 MED ORDER — LIDOCAINE-EPINEPHRINE 1 %-1:100000 IJ SOLN
INTRAMUSCULAR | Status: DC | PRN
  Administered 2016-05-17: 5 mL via INTRADERMAL

## 2016-05-17 MED ORDER — ACETAMINOPHEN 650 MG RE SUPP: 650.0000 mg | RECTAL | Status: DC | PRN

## 2016-05-17 MED ORDER — THROMBIN 20000 UNITS EX SOLR
CUTANEOUS | Status: AC
  Filled 2016-05-17: qty 20000

## 2016-05-17 MED ORDER — MENTHOL 3 MG MT LOZG: 1.0000 | LOZENGE | OROMUCOSAL | Status: DC | PRN

## 2016-05-17 MED ORDER — TERIFLUNOMIDE 14 MG PO TABS: 14.0000 mg | ORAL_TABLET | Freq: Every day | ORAL | Status: DC

## 2016-05-17 MED ORDER — AMPHETAMINE-DEXTROAMPHET ER 15 MG PO CP24
15.0000 mg | ORAL_CAPSULE | Freq: Two times a day (BID) | ORAL | Status: DC
  Filled 2016-05-17: qty 1

## 2016-05-17 MED ORDER — EPHEDRINE SULFATE 50 MG/ML IJ SOLN
INTRAMUSCULAR | Status: DC | PRN
  Administered 2016-05-17 (×4): 5 mg via INTRAVENOUS
  Administered 2016-05-17: 10 mg via INTRAVENOUS

## 2016-05-17 MED ORDER — CYCLOBENZAPRINE HCL 10 MG PO TABS
ORAL_TABLET | ORAL | Status: AC
  Filled 2016-05-17: qty 1

## 2016-05-17 MED ORDER — HYDROCODONE-ACETAMINOPHEN 10-325 MG PO TABS
2.0000 | ORAL_TABLET | ORAL | Status: DC | PRN
  Administered 2016-05-17 – 2016-05-18 (×3): 2 via ORAL
  Filled 2016-05-17 (×3): qty 2

## 2016-05-17 MED ORDER — HYDROMORPHONE HCL 1 MG/ML IJ SOLN
0.5000 mg | INTRAMUSCULAR | Status: DC | PRN
  Administered 2016-05-17 – 2016-05-18 (×7): 1 mg via INTRAVENOUS
  Filled 2016-05-17 (×7): qty 1

## 2016-05-17 MED ORDER — ROCURONIUM BROMIDE 10 MG/ML (PF) SYRINGE
PREFILLED_SYRINGE | INTRAVENOUS | Status: AC
  Filled 2016-05-17: qty 30

## 2016-05-17 MED ORDER — IBUPROFEN 200 MG PO TABS: 800.0000 mg | ORAL_TABLET | Freq: Three times a day (TID) | ORAL | Status: DC | PRN

## 2016-05-17 MED ORDER — MEPERIDINE HCL 25 MG/ML IJ SOLN: 6.2500 mg | INTRAMUSCULAR | Status: DC | PRN

## 2016-05-17 MED ORDER — MIDAZOLAM HCL 2 MG/2ML IJ SOLN
INTRAMUSCULAR | Status: AC
  Filled 2016-05-17: qty 2

## 2016-05-17 MED ORDER — GABAPENTIN 800 MG PO TABS
800.0000 mg | ORAL_TABLET | Freq: Four times a day (QID) | ORAL | Status: DC
  Filled 2016-05-17 (×3): qty 1

## 2016-05-17 MED ORDER — DEXAMETHASONE SODIUM PHOSPHATE 10 MG/ML IJ SOLN
INTRAMUSCULAR | Status: DC | PRN
  Administered 2016-05-17: 10 mg via INTRAVENOUS

## 2016-05-17 MED ORDER — MIDAZOLAM HCL 5 MG/5ML IJ SOLN
INTRAMUSCULAR | Status: DC | PRN
  Administered 2016-05-17: 2 mg via INTRAVENOUS

## 2016-05-17 MED ORDER — VANCOMYCIN HCL 1000 MG IV SOLR
INTRAVENOUS | Status: DC | PRN
  Administered 2016-05-17: 1000 mg via TOPICAL

## 2016-05-17 MED ORDER — PROMETHAZINE HCL 25 MG/ML IJ SOLN: 6.2500 mg | INTRAMUSCULAR | Status: DC | PRN

## 2016-05-17 MED ORDER — BUPIVACAINE HCL (PF) 0.25 % IJ SOLN
INTRAMUSCULAR | Status: DC | PRN
  Administered 2016-05-17: 5 mL

## 2016-05-17 MED ORDER — SUGAMMADEX SODIUM 500 MG/5ML IV SOLN
INTRAVENOUS | Status: DC | PRN
  Administered 2016-05-17: 300 mg via INTRAVENOUS

## 2016-05-17 MED ORDER — CYCLOBENZAPRINE HCL 10 MG PO TABS
10.0000 mg | ORAL_TABLET | Freq: Three times a day (TID) | ORAL | Status: DC | PRN
  Administered 2016-05-17 – 2016-05-19 (×2): 10 mg via ORAL
  Filled 2016-05-17: qty 1

## 2016-05-17 MED ORDER — PHENYLEPHRINE HCL 10 MG/ML IJ SOLN
INTRAMUSCULAR | Status: DC | PRN
  Administered 2016-05-17 (×3): 40 ug via INTRAVENOUS

## 2016-05-17 MED ORDER — GLYCOPYRROLATE 0.2 MG/ML IJ SOLN
INTRAMUSCULAR | Status: DC | PRN
  Administered 2016-05-17 (×2): 0.2 mg via INTRAVENOUS

## 2016-05-17 MED ORDER — ONDANSETRON HCL 4 MG/2ML IJ SOLN: 4.0000 mg | INTRAMUSCULAR | Status: DC | PRN

## 2016-05-17 MED FILL — Sodium Chloride IV Soln 0.9%: INTRAVENOUS | Qty: 1000 | Status: AC

## 2016-05-17 MED FILL — Heparin Sodium (Porcine) Inj 1000 Unit/ML: INTRAMUSCULAR | Qty: 30 | Status: AC

## 2016-05-17 SURGICAL SUPPLY — 79 items
BAG DECANTER FOR FLEXI CONT (MISCELLANEOUS) ×4 IMPLANT
BENZOIN TINCTURE PRP APPL 2/3 (GAUZE/BANDAGES/DRESSINGS) ×2 IMPLANT
BLADE CLIPPER SURG (BLADE) ×2 IMPLANT
BLADE SURG 11 STRL SS (BLADE) ×2 IMPLANT
BUR MATCHSTICK NEURO 3.0 LAGG (BURR) ×2 IMPLANT
BUR PRECISION FLUTE 6.0 (BURR) ×2 IMPLANT
CANISTER SUCT 3000ML PPV (MISCELLANEOUS) ×2 IMPLANT
CLSR STERI-STRIP ANTIMIC 1/2X4 (GAUZE/BANDAGES/DRESSINGS) ×2 IMPLANT
CONN CROSSLINK REV 38-50MM (Connector) ×2 IMPLANT
CONNECTOR CRSLINK REV 38-50MM (Connector) ×1 IMPLANT
CONT SPEC 4OZ CLIKSEAL STRL BL (MISCELLANEOUS) ×4 IMPLANT
COVER BACK TABLE 60X90IN (DRAPES) ×2 IMPLANT
DECANTER SPIKE VIAL GLASS SM (MISCELLANEOUS) ×2 IMPLANT
DERMABOND ADVANCED (GAUZE/BANDAGES/DRESSINGS) ×1
DERMABOND ADVANCED .7 DNX12 (GAUZE/BANDAGES/DRESSINGS) ×1 IMPLANT
DRAPE C-ARM 42X72 X-RAY (DRAPES) ×2 IMPLANT
DRAPE C-ARMOR (DRAPES) ×2 IMPLANT
DRAPE HALF SHEET 40X57 (DRAPES) ×2 IMPLANT
DRAPE LAPAROTOMY 100X72X124 (DRAPES) ×2 IMPLANT
DRAPE POUCH INSTRU U-SHP 10X18 (DRAPES) ×2 IMPLANT
DRAPE SURG 17X23 STRL (DRAPES) ×2 IMPLANT
DRSG OPSITE 4X5.5 SM (GAUZE/BANDAGES/DRESSINGS) ×2 IMPLANT
DRSG OPSITE POSTOP 4X10 (GAUZE/BANDAGES/DRESSINGS) ×2 IMPLANT
DRSG OPSITE POSTOP 4X8 (GAUZE/BANDAGES/DRESSINGS) ×2 IMPLANT
DURAPREP 26ML APPLICATOR (WOUND CARE) ×2 IMPLANT
ELECT BLADE 4.0 EZ CLEAN MEGAD (MISCELLANEOUS) ×2
ELECT REM PT RETURN 9FT ADLT (ELECTROSURGICAL) ×2
ELECTRODE BLDE 4.0 EZ CLN MEGD (MISCELLANEOUS) ×1 IMPLANT
ELECTRODE REM PT RTRN 9FT ADLT (ELECTROSURGICAL) ×1 IMPLANT
EVACUATOR 3/16  PVC DRAIN (DRAIN) ×1
EVACUATOR 3/16 PVC DRAIN (DRAIN) ×1 IMPLANT
GAUZE SPONGE 4X4 12PLY STRL (GAUZE/BANDAGES/DRESSINGS) ×2 IMPLANT
GAUZE SPONGE 4X4 16PLY XRAY LF (GAUZE/BANDAGES/DRESSINGS) ×2 IMPLANT
GLOVE BIO SURGEON STRL SZ7 (GLOVE) ×4 IMPLANT
GLOVE BIO SURGEON STRL SZ8 (GLOVE) ×4 IMPLANT
GLOVE BIOGEL PI IND STRL 7.0 (GLOVE) ×1 IMPLANT
GLOVE BIOGEL PI INDICATOR 7.0 (GLOVE) ×1
GLOVE ECLIPSE 9.0 STRL (GLOVE) ×2 IMPLANT
GLOVE EXAM NITRILE LRG STRL (GLOVE) IMPLANT
GLOVE EXAM NITRILE XL STR (GLOVE) IMPLANT
GLOVE EXAM NITRILE XS STR PU (GLOVE) IMPLANT
GLOVE INDICATOR 8.5 STRL (GLOVE) ×4 IMPLANT
GLOVE SURG SS PI 6.5 STRL IVOR (GLOVE) ×4 IMPLANT
GOWN STRL REUS W/ TWL LRG LVL3 (GOWN DISPOSABLE) ×4 IMPLANT
GOWN STRL REUS W/ TWL XL LVL3 (GOWN DISPOSABLE) ×4 IMPLANT
GOWN STRL REUS W/TWL 2XL LVL3 (GOWN DISPOSABLE) IMPLANT
GOWN STRL REUS W/TWL LRG LVL3 (GOWN DISPOSABLE) ×4
GOWN STRL REUS W/TWL XL LVL3 (GOWN DISPOSABLE) ×4
KIT BASIN OR (CUSTOM PROCEDURE TRAY) ×2 IMPLANT
KIT INFUSE MEDIUM (Orthopedic Implant) ×2 IMPLANT
KIT ROOM TURNOVER OR (KITS) ×2 IMPLANT
MARKER SKIN DUAL TIP RULER LAB (MISCELLANEOUS) ×2 IMPLANT
MILL MEDIUM DISP (BLADE) ×2 IMPLANT
MIX DBX 10CC 35% BONE (Bone Implant) ×2 IMPLANT
NEEDLE HYPO 21X1.5 SAFETY (NEEDLE) ×2 IMPLANT
NEEDLE HYPO 25X1 1.5 SAFETY (NEEDLE) ×2 IMPLANT
NS IRRIG 1000ML POUR BTL (IV SOLUTION) ×2 IMPLANT
PACK LAMINECTOMY NEURO (CUSTOM PROCEDURE TRAY) ×2 IMPLANT
PAD ARMBOARD 7.5X6 YLW CONV (MISCELLANEOUS) ×6 IMPLANT
PUTTY KINEX BIOACTIVE 2CC (Bone Implant) ×2 IMPLANT
ROD REVERE LOCKING 6.35 (Rod) ×2 IMPLANT
SCREW REVERE 6.35 6.5MMX45 (Screw) ×6 IMPLANT
SCREW REVERE 6.35 6.5X40MM (Screw) ×2 IMPLANT
SCREW REVERE 6.5X50MM (Screw) ×4 IMPLANT
SCREW SET BREAK OFF (Screw) ×8 IMPLANT
SPONGE LAP 4X18 X RAY DECT (DISPOSABLE) ×2 IMPLANT
SPONGE SURGIFOAM ABS GEL 100 (HEMOSTASIS) ×2 IMPLANT
STRIP CLOSURE SKIN 1/2X4 (GAUZE/BANDAGES/DRESSINGS) ×4 IMPLANT
STYLET INTUB SATIN SLIP 14FR (MISCELLANEOUS) ×2 IMPLANT
SUT VIC AB 0 CT1 18XCR BRD8 (SUTURE) ×2 IMPLANT
SUT VIC AB 0 CT1 8-18 (SUTURE) ×2
SUT VIC AB 2-0 CT1 18 (SUTURE) ×4 IMPLANT
SUT VIC AB 3-0 SH 8-18 (SUTURE) ×2 IMPLANT
SUT VIC AB 4-0 PS2 27 (SUTURE) IMPLANT
SYR 20CC LL (SYRINGE) ×2 IMPLANT
TOWEL OR 17X24 6PK STRL BLUE (TOWEL DISPOSABLE) ×2 IMPLANT
TOWEL OR 17X26 10 PK STRL BLUE (TOWEL DISPOSABLE) ×2 IMPLANT
TRAY FOLEY W/METER SILVER 16FR (SET/KITS/TRAYS/PACK) ×2 IMPLANT
WATER STERILE IRR 1000ML POUR (IV SOLUTION) ×2 IMPLANT

## 2016-05-17 NOTE — Op Note (Signed)
Preoperative diagnosis: Thoracic myelopathy looks to me weakness with thoracic stenosis T10-11, T11-12, T12-L1  Postoperative diagnosis: Same  Procedure: Decompressive thoracic laminectomy at T10-11, T11-12, and T12-L1 with partial medial facetectomies and foraminotomies of the T10, T11, T12, and L1 nerve roots.  #2 pedicle screw fixation T10 L2 utilizing the globus Revere pedicle screws at T10, T11, T12 tying into old the Medtronic Legacy pedicle screws at L1 and L2. With 6.5 x 45 mm screws at T11 and T12 and 6.5 x 40 at T10  #3 posterior lateral arthrodesis T10-L1 utilizing locally harvested autograft mixed with DBX mix and BMP as well as kinex  Surgeon: Mark [name]  Asst.: Mallie Mussel pool  Anesthesia: Gen.  EBL: Minimal  History of present illness: Patient is a very pleasant 55 year old gentleman has had long-standing progressive worsening back and bilateral leg pain weakness difficulty walking. Workup revealed severe cord compression thoracic stenosis at T10-11, T11-12, T12-L1. Due to patient's progression of clinical syndrome failed conservative treatment imaging findings and recommended decompressive thoracic laminectomy and fusion from T10-L2. I extensively went over the risks and benefits of the operation with the patient as well as perioperative course expectations of outcome and alternatives of surgery and he understands and agrees to proceed forward.  Operative procedure: Patient brought into the or was induced on general she's positioned prone on the Wilson frame his back was prepped and draped in routine sterile fashion is old incision was opened up and extended cephalad subperiosteal dissection carried lamina of T9, T10, T11, T12, and identify the screws and hardware from L1-L2. The old fusion appear to be solid L1-L2 after a confirmed the appropriate level removed the spinous processes at T10, T11, T12 perform central decompression by drilling down the lamina with a high-speed drill and  will margin of the gutters of the tumor Kerrison punch taking care to minimize any retraction replacement on thecal sac and spinal cord. There was severe hourglass compression of the spinal cord and thecal sac at all 3 levels especially at T11-12. This is all teased off the removed piece of fashion decompress the central canal. After wide and adequate decompression achieved from T10 down to L1 attention taken to pedicle screw placement. Utilizing-AP and lateral fluoroscopy pilot holes were drilled K with the awl probed tapped with a 55 Probed again and initially a 6 5 x 50 screw was inserted on the left at T12 then 6 5 x 45 on the left T11 6 5 x 45 on the left at T10 and on the right side a 6 5 x 40 at T10 6 x 45 at T11 and 6 5 x 45 at T12. All screws were in good position and confirmed by fluoroscopy. The wounds, see her good fixing space was maintained I aggressively decorticated the TPs and lateral gutters from T10 down to L1 packed local autograft mixed with DBX and kinex And BMP posterior laterally. Then fashioned and cut 2 rods anchored in place with top tightening nuts and placed a cross-link placed some additional bone graft lateral to the rods then explored the spinal cord decompression site to confirm no migration of graft material placed a large Hemovac drain speckled vancomycin powder in the wound and injected X Burrell. I then closed the wound in layers with interrupted Vicryls and Dermabond benzo insertions and sterile dressing was applied patient recovered in stable condition. At the end of case all needle counts and sponge counts were correct.

## 2016-05-17 NOTE — Progress Notes (Signed)
Attempted to see if patient had echo or stress test  From [facility]- office stated they did not see one. Patient has not been there since 2009 .

## 2016-05-17 NOTE — Anesthesia Preprocedure Evaluation (Signed)
Anesthesia Evaluation  Patient identified by MRN, date of birth, ID band Patient awake    Reviewed: Allergy & Precautions, NPO status , Patient's Chart, lab work & pertinent test results  History of Anesthesia Complications Negative for: history of anesthetic complications  Airway Mallampati: III  TM Distance: <3 FB Neck ROM: Full    Dental  (+) Teeth Intact, Dental Advisory Given, Poor Dentition Multiple carious teeth:   Pulmonary sleep apnea , pneumonia,    breath sounds clear to auscultation       Cardiovascular + Peripheral Vascular Disease   Rhythm:Regular Rate:Normal     Neuro/Psych Multiple sclerosis  Neuromuscular disease    GI/Hepatic negative GI ROS, Neg liver ROS,   Endo/Other  Morbid obesity  Renal/GU negative Renal ROS     Musculoskeletal  (+) Arthritis ,   Abdominal (+) + obese,   Peds  Hematology   Anesthesia Other Findings   Reproductive/Obstetrics                             Anesthesia Physical  Anesthesia Plan  ASA: III  Anesthesia Plan: General   Post-op Pain Management:    Induction: Intravenous  Airway Management Planned: Oral ETT and Video Laryngoscope Planned  Additional Equipment:   Intra-op Plan:   Post-operative Plan: Extubation in OR  Informed Consent: I have reviewed the patients History and Physical, chart, labs and discussed the procedure including the risks, benefits and alternatives for the proposed anesthesia with the patient or authorized representative who has indicated his/her understanding and acceptance.   Dental advisory given  Plan Discussed with: CRNA  Anesthesia Plan Comments:         Anesthesia Quick Evaluation

## 2016-05-17 NOTE — H&P (Signed)
Mark [name]. is an 55 y.o. male.   Chief Complaint: Back and bilateral leg pain and weakness HPI: Patient is a 55 year old gentleman is a long-standing history with this neck thoracic and lumbar spine. He's had a previous L1 S1 fusion he's had thoracic decompressive laminectomies he's had anterior cervical discectomy and fusion. Over the last several months she's had progressive worsening weakness and numbness in his lower 70s. Workup has revealed lower thoracic severe spinal stenosis and due to his clinical exam consistent with a progressive thoracic myelopathy and severe stenosis above his previous L1-L2 fusion I recommended decompressive thoracic laminectomy from T10-11 to L1 and pedicle screw fixation T10-L2 tying into his old construct at L2. I've extensively gone over the risks and benefits of the operation the patient as well as perioperative course expectations of outcome and alternatives of surgery and he understood and agrees to proceed forward  Past Medical History:  Diagnosis Date  . Anxiety    chronic pain treated with narcotics  . Arthritis   . Chronic pain   . Constipation due to opioid therapy   . Depression   . Dysphonia    thoracic left sided"impingement" with heminumbness  . GI bleed    abt 15 years ago  . Morbid obesity ([place])   . MS (multiple sclerosis) ([place]) 02/11/2013   [name]  . MS (multiple sclerosis) ([place])   . Neuromuscular disorder ([place])   . Pneumonia   . Sleep apnea with use of continuous positive airway pressure (CPAP) 02/11/2013   07-13-12 AHi of 98.6 /hr titrated to 11 cm water ,  average user time 4 hours and 4 minutes. Residual AHI 1.20 December 2012 .  Marland Kitchen Umbilical hernia     Past Surgical History:  Procedure Laterality Date  . CARPAL TUNNEL RELEASE Left    [name]  . COLONOSCOPY W/ POLYPECTOMY    . POSTERIOR LUMBAR FUSION  04/17/15   [name], also removed old hardware  . SPINE SURGERY     total of six vertebras fused, three  lumbar surgeries and one anterior neck fusion  . TONSILLECTOMY      Family History  Problem Relation Age of Onset  . Cancer Other   . Cancer Other   . Sleep walking Son    Social History:  reports that he has never smoked. He has never used smokeless tobacco. He reports that he drinks alcohol. He reports that he does not use drugs.  Allergies: No Known Allergies  Medications Prior to Admission  Medication Sig Dispense Refill  . ALPRAZolam (XANAX) 0.5 MG tablet Take 1-2 tablets before procedures. (Patient taking differently: Take 0.5-1 mg by mouth as directed. Take 1-2 tablets before procedures.) 10 tablet 0  . amphetamine-dextroamphetamine (ADDERALL XR) 15 MG 24 hr capsule Take 15 mg by mouth 2 (two) times daily.  0  . DULoxetine (CYMBALTA) 60 MG capsule Take 60 mg by mouth 2 (two) times daily.     Marland Kitchen gabapentin (NEURONTIN) 800 MG tablet Take 800 mg by mouth 4 (four) times daily.  11  . HYDROcodone-acetaminophen (NORCO) 10-325 MG per tablet Take 2 tablets by mouth every 4 (four) hours as needed for severe pain.     Marland Kitchen ibuprofen (ADVIL,MOTRIN) 800 MG tablet Take 800 mg by mouth every 8 (eight) hours as needed for moderate pain.    . meloxicam (MOBIC) 7.5 MG tablet Take 7.5 mg by mouth 4 (four) times daily.  11  . methylPREDNISolone (  MEDROL DOSEPAK) 4 MG TBPK tablet Take 4-24 mg by mouth See admin instructions. Take these number of tablets on consecutive days:6-5-4-3-2-1  0  . tamsulosin (FLOMAX) 0.4 MG CAPS capsule Take 0.4 mg by mouth at bedtime.     . Teriflunomide (AUBAGIO) 14 MG TABS Take 14 mg by mouth at bedtime. Dr Rae Mar    . tiZANidine (ZANAFLEX) 4 MG tablet Take 4 mg by mouth 4 (four) times daily.       No results found for this or any previous visit (from the past 48 hour(s)). No results found.  Review of Systems  Musculoskeletal: Positive for back pain, falls and myalgias.  Neurological: Positive for sensory change.  All other systems reviewed and are  negative.   Blood pressure (!) 168/96, pulse 65, temperature 98.6 F (37 C), temperature source Oral, resp. rate 18, height 6\' 1"  (1.854 m), weight (!) 147.9 kg (326 lb), SpO2 97 %. Physical Exam  Constitutional: He is oriented to person, place, and time. He appears well-developed and well-nourished.  HENT:  Head: Normocephalic and atraumatic.  Eyes: Pupils are equal, round, and reactive to light.  Neck: Normal range of motion.  Respiratory: Effort normal.  GI: Soft. Bowel sounds are normal.  Neurological: He is alert and oriented to person, place, and time. He has normal strength. GCS eye subscore is 4. GCS verbal subscore is 5. GCS motor subscore is 6.  Strength is 5 out of 5 distal lower extremity dorsiflexion and plantarflexion quadriceps and hamstrings however iliopsoas is 4+ out of 5 right lower extremity     Assessment/Plan 55 year old presents for T10 L2 fusion with decompressive thoracic laminectomies T10-11 T11-T12 T12-L1  Edilberto Roosevelt P, MD 05/17/2016, 9:40 AM

## 2016-05-17 NOTE — Anesthesia Procedure Notes (Signed)
Procedure Name: Intubation Date/Time: 05/17/2016 10:14 AM Performed by: Shirlyn Goltz Pre-anesthesia Checklist: Patient identified, Emergency Drugs available, Suction available and Patient being monitored Patient Re-evaluated:Patient Re-evaluated prior to inductionOxygen Delivery Method: Circle system utilized Preoxygenation: Pre-oxygenation with 100% oxygen Intubation Type: IV induction Ventilation: Mask ventilation without difficulty Laryngoscope Size: Glidescope and 4 Grade View: Grade I Tube type: Oral Tube size: 7.5 mm Number of attempts: 1 Airway Equipment and Method: Rigid stylet and Video-laryngoscopy Placement Confirmation: ETT inserted through vocal cords under direct vision,  positive ETCO2 and breath sounds checked- equal and bilateral Secured at: 23 cm Tube secured with: Tape Dental Injury: Teeth and Oropharynx as per pre-operative assessment

## 2016-05-17 NOTE — Transfer of Care (Signed)
Immediate Anesthesia Transfer of Care Note  Patient: Mark Botbyl Ballo Jr.  Procedure(s) Performed: Procedure(s) with comments: THORASIC TEN - THORASIC ELEVEN, THORASIC ELEVEN - THORASIC TWELVE, THORASIC TWELVE - LUMBAR ONE POSTERIOR LATERAL ARTHRODESIS (N/A) - THORASIC TEN - THORASIC ELEVEN, THORASIC ELEVEN - THORASIC TWELVE, THORASIC TWELVE - LUMBAR ONE POSTERIOR LATERAL ARTHRODESIS   Patient Location: PACU  Anesthesia Type:General  Level of Consciousness: awake, alert , oriented and patient cooperative  Airway & Oxygen Therapy: Patient Spontanous Breathing and Patient connected to nasal cannula oxygen  Post-op Assessment: Report given to RN, Post -op Vital signs reviewed and stable and Patient moving all extremities X 4  Post vital signs: Reviewed and stable  Last Vitals:  Vitals:   05/17/16 0844 05/17/16 1545  BP: (!) 168/96   Pulse: 65   Resp: 18   Temp: 37 C (P) 36.7 C    Last Pain:  Vitals:   05/17/16 0902  TempSrc:   PainSc: 8       Patients Stated Pain Goal: 3 ([id number])  Complications: No apparent anesthesia complications

## 2016-05-17 NOTE — Anesthesia Postprocedure Evaluation (Signed)
Anesthesia Post Note  Patient: Yazen Charvat Seyler Jr.  Procedure(s) Performed: Procedure(s) (LRB): THORASIC TEN - THORASIC ELEVEN, THORASIC ELEVEN - THORASIC TWELVE, THORASIC TWELVE - LUMBAR ONE POSTERIOR LATERAL ARTHRODESIS (N/A)  Patient location during evaluation: PACU Anesthesia Type: General Level of consciousness: awake and alert Pain management: pain level controlled Vital Signs Assessment: post-procedure vital signs reviewed and stable Respiratory status: spontaneous breathing, nonlabored ventilation, respiratory function stable and patient connected to nasal cannula oxygen Cardiovascular status: blood pressure returned to baseline and stable Postop Assessment: no signs of nausea or vomiting Anesthetic complications: no    Last Vitals:  Vitals:   05/17/16 1704 05/17/16 1733  BP:  (!) 156/91  Pulse: 65 62  Resp: 15 14  Temp: 36.2 C 36.8 C    Last Pain:  Vitals:   05/17/16 1733  TempSrc: Oral  PainSc:                  Effie Berkshire

## 2016-05-17 NOTE — Progress Notes (Signed)
Patient arrived to 5C08 from PACU. Safety precautions and orders reviewed with patient. ICS encourage. VSS. No other distress noted. Will continue to monitor.   Ave Filter, RN

## 2016-05-18 MED ORDER — CHLORHEXIDINE GLUCONATE 0.12 % MT SOLN
15.0000 mL | Freq: Two times a day (BID) | OROMUCOSAL | Status: DC
  Administered 2016-05-18: 15 mL via OROMUCOSAL
  Filled 2016-05-18: qty 15

## 2016-05-18 MED ORDER — ORAL CARE MOUTH RINSE: 15.0000 mL | Freq: Two times a day (BID) | OROMUCOSAL | Status: DC

## 2016-05-18 NOTE — Progress Notes (Signed)
Upon initiation of removal of foley this AM, patient refused. Patient and wife educated about infection risk, discussed plan of care; stated understanding. Patient refused any mobilization this AM, states "I am not up for it right now, I will try later. Last surgery the foley stayed in for a whole day, but I know it has to come out." Continue to monitor.

## 2016-05-18 NOTE — Evaluation (Signed)
Occupational Therapy Evaluation Patient Details Name: Mark [name]. MRN: [id number] DOB: [birth year] Today's Date: 05/18/2016    History of Present Illness 55 y.o. male with hx of multiple spinal surgery and multiple sclerosis, underwent decompressive thoracic laminectomy at T10-11, T11-12, and T12-L1 with partial medial facetectomies and foraminotomies of the T10, T11, T12, and L1 nerve roots.   Clinical Impression   Pt admitted with above. He demonstrates the below listed deficits and will benefit from continued OT to maximize safety and independence with BADLs.  Pt requires max A for LB ADLs and min A for functional mobility.  His wife is very supportive and able to assist as needed at discharge.  Will follow acutely.       Follow Up Recommendations  No OT follow up;Supervision/Assistance - 24 hour    Equipment Recommendations  None recommended by OT    Recommendations for Other Services       Precautions / Restrictions Precautions Precautions: Back;Fall Precaution Booklet Issued: No Precaution Comments: pt able to state back precautions independently  Required Braces or Orthoses: Spinal Brace Spinal Brace: Lumbar corset Restrictions Weight Bearing Restrictions: No      Mobility Bed Mobility               General bed mobility comments: sitting up in chair   Transfers Overall transfer level: Needs assistance Equipment used: Rolling walker (2 wheeled) Transfers: Sit to/from [facility] Sit to Stand: Min assist Stand pivot transfers: Min assist       General transfer comment: min A to lift hips from low surface     Balance Overall balance assessment: Needs assistance Sitting-balance support: Feet supported Sitting balance-Leahy Scale: Good     Standing balance support: No upper extremity supported Standing balance-Leahy Scale: Fair                              ADL Overall ADL's : Needs  assistance/impaired Eating/Feeding: Independent   Grooming: Wash/dry hands;Wash/dry face;Oral care;Brushing hair;Minimal assistance;Standing Grooming Details (indicate cue type and reason): reviewed safe technique for brushing teeth  Upper Body Bathing: Set up;Supervision/ safety;Sitting   Lower Body Bathing: Maximal assistance;Sit to/from stand Lower Body Bathing Details (indicate cue type and reason): Pt has long handled loofa he will use to bathe LEs  Upper Body Dressing : Supervision/safety;Set up;Sitting   Lower Body Dressing: Maximal assistance;Sit to/from stand Lower Body Dressing Details (indicate cue type and reason): Pt has reacher and is able to describe correct method for donning pants.  Wife will assist him with sandles.  He doesn't wear socks and has no interest in sock aid  Toilet Transfer: Minimal assistance;Ambulation;Comfort height toilet;Grab bars;RW   Toileting- Clothing Manipulation and Hygiene: Moderate assistance;Sit to/from stand       Functional mobility during ADLs: Min guard;Minimal assistance;Rolling walker General ADL Comments: wife very supportive      Vision     Perception     Praxis      Pertinent Vitals/Pain Pain Assessment: 0-10 Pain Score: 5  Pain Location: back  Pain Descriptors / Indicators: Aching;Operative site guarding Pain Intervention(s): Limited activity within patient's tolerance;Monitored during session;Premedicated before session     Hand Dominance Right   Extremity/Trunk Assessment Upper Extremity Assessment Upper Extremity Assessment: Overall WFL for tasks assessed   Lower Extremity Assessment Lower Extremity Assessment: Defer to PT evaluation   Cervical / Trunk Assessment Cervical / Trunk Assessment: Normal   Communication Communication  Communication: No difficulties   Cognition Arousal/Alertness: Awake/alert Behavior During Therapy: WFL for tasks assessed/performed Overall Cognitive Status: Within Functional  Limits for tasks assessed                     General Comments       Exercises       Shoulder Instructions      Home Living Family/patient expects to be discharged to:: Private residence Living Arrangements: Spouse/significant other Available Help at Discharge: Family Type of Home: Other(Comment) (RV) Home Access: Ramped entrance     Home Layout: Two level Alternate Level Stairs-Number of Steps: 2 Alternate Level Stairs-Rails: Right Bathroom Shower/Tub: Hospital doctor Toilet: Handicapped height     Home Equipment: Environmental consultant - 2 wheels;Bedside commode;Shower seat;Wheelchair - manual;Cane - single point;Adaptive equipment;Other (comment) (lift chair and adjustable bed ) Adaptive Equipment: Reacher Additional Comments: Living in an RV, has equipment, nity center      Prior Functioning/Environment Level of Independence: Needs assistance  Gait / Transfers Assistance Needed: Cane to ambulate, multiple falls ADL's / Homemaking Assistance Needed: Wife would assist dressing lower body   Comments: wife does not wear socks         OT Problem List: Decreased strength;Impaired balance (sitting and/or standing);Decreased safety awareness;Decreased knowledge of use of DME or AE;Decreased knowledge of precautions;Obesity;Pain   OT Treatment/Interventions: Self-care/ADL training;DME and/or AE instruction;Therapeutic activities;Patient/family education    OT Goals(Current goals can be found in the care plan section) Acute Rehab OT Goals Patient Stated Goal: to regain independence  OT Goal Formulation: With patient Time For Goal Achievement: 06/01/16 Potential to Achieve Goals: Good ADL Goals Pt Will Perform Grooming: with supervision;standing Pt Will Perform Lower Body Bathing: with supervision;sit to/from stand;with adaptive equipment Pt Will Perform Lower Body Dressing: with supervision;sit to/from stand;with adaptive equipment (pants only ) Pt Will Transfer to  Toilet: with supervision;ambulating;regular height toilet;bedside commode;grab bars Pt Will Perform Toileting - Clothing Manipulation and hygiene: with supervision;with adaptive equipment;sit to/from stand  OT Frequency: Min 2X/week   Barriers to D/C:            Co-evaluation              End of Session Equipment Utilized During Treatment: Gait belt;Back brace Nurse Communication: Mobility status  Activity Tolerance: Patient tolerated treatment well Patient left: in chair;with call bell/phone within reach;with chair alarm set;with family/visitor present   Time: 1225-1255 OT Time Calculation (min): 30 min Charges:  OT General Charges $OT Visit: 1 Procedure OT Evaluation $OT Eval Moderate Complexity: 1 Procedure OT Treatments $Self Care/Home Management : 8-22 mins G-Codes:    Nakshatra Klose M 2016/05/28, 5:26 PM

## 2016-05-18 NOTE — Evaluation (Signed)
Physical Therapy Evaluation Patient Details Name: Mark [name]. MRN: [id number] DOB: 07/23/1960 Today's Date: 05/18/2016   History of Present Illness  55 y.o. male with hx of multiple spinal surgery and multiple sclerosis, underwent decompressive thoracic laminectomy at T10-11, T11-12, and T12-L1 with partial medial facetectomies and foraminotomies of the T10, T11, T12, and L1 nerve roots.  Clinical Impression  Patient is seen following the above procedure, presenting with functional limitations due to the deficits listed below (see PT Problem List). Patient reports feeling much stronger after surgery than he did prior to procedure. Requires min-mod assist for getting out of bed and ambulating short distance today. Anticipate he will progress well enough to return home with is wife safely but may benefit from HHPT follow-up due to living in confined space, limiting navigation in RV. Patient will benefit from skilled PT to increase their independence and safety with mobility to allow discharge to the venue listed below.       Follow Up Recommendations Home health PT;Supervision for mobility/OOB (Due to staying in RV, would likely benefit from home safety evaluation and recommendations. Pt also reports hx of non-compliance following spinal surgeries resulting in further injuries.)    Equipment Recommendations  None recommended by PT    Recommendations for Other Services       Precautions / Restrictions Precautions Precautions: Back;Fall Precaution Booklet Issued: No Precaution Comments: Reviewed precautions Required Braces or Orthoses: Spinal Brace Spinal Brace: Thoracolumbosacral orthotic Restrictions Weight Bearing Restrictions: No      Mobility  Bed Mobility Overal bed mobility: Needs Assistance Bed Mobility: Rolling;Sidelying to Sit Rolling: Min guard Sidelying to sit: Mod assist       General bed mobility comments: Min assist for truncal support while rising from  sidelying positon after practicing log roll technique. Cues throughout.  Transfers Overall transfer level: Needs assistance Equipment used: Rolling walker (2 wheeled) Transfers: Sit to/from Stand Sit to Stand: Min assist;From elevated surface         General transfer comment: Min assist for boost to stand from slightly elevated surface. VC for safety and technique with RW for support once upright.  Ambulation/Gait Ambulation/Gait assistance: Min assist Ambulation Distance (Feet): 5 Feet Assistive device: Rolling walker (2 wheeled) Gait Pattern/deviations: Step-to pattern;Decreased stride length;Shuffle;Trunk flexed;Wide base of support Gait velocity: decreased Gait velocity interpretation: Below normal speed for age/gender General Gait Details: Educated on walker placement with closer proximity for support. Cues for upright posture. No buckling noted with moderate support through UEs on RW. Limited distance due to pain today but states he feels stronger now than he did prior to surgery.  Stairs            Wheelchair Mobility    Modified Rankin (Stroke Patients Only)       Balance Overall balance assessment: Needs assistance Sitting-balance support: No upper extremity supported;Feet supported Sitting balance-Leahy Scale: Good     Standing balance support: No upper extremity supported Standing balance-Leahy Scale: Fair                               Pertinent Vitals/Pain      Home Living Family/patient expects to be discharged to:: Private residence Living Arrangements: Spouse/significant other Available Help at Discharge: Family Type of Home: Other(Comment) (RV) Home Access: Ramped entrance     Home Layout: Two level Home Equipment: [place] - 2 wheels;Bedside commode;Shower seat;Wheelchair - manual;Cane - single point Additional Comments: Living in an  RV, has equipment, plans to shower at community center    Prior Function Level of Independence:  Needs assistance   Gait / Transfers Assistance Needed: Kasandra Knudsen to ambulate, multiple falls  ADL's / Homemaking Assistance Needed: Wife would assist dressing lower body        Hand Dominance   Dominant Hand: Right    Extremity/Trunk Assessment   Upper Extremity Assessment: Defer to OT evaluation           Lower Extremity Assessment: Generalized weakness      Cervical / Trunk Assessment: Normal  Communication   Communication: No difficulties  Cognition Arousal/Alertness: Awake/alert Behavior During Therapy: WFL for tasks assessed/performed Overall Cognitive Status: Within Functional Limits for tasks assessed                      General Comments General comments (skin integrity, edema, etc.): Educated on brace use    Exercises     Assessment/Plan    PT Assessment Patient needs continued PT services  PT Problem List Decreased strength;Decreased activity tolerance;Decreased balance;Decreased mobility;Decreased knowledge of use of DME;Decreased knowledge of precautions;Obesity;Pain          PT Treatment Interventions DME instruction;Gait training;Stair training;Functional mobility training;Therapeutic activities;Therapeutic exercise;Balance training;Neuromuscular re-education;Patient/family education    PT Goals (Current goals can be found in the Care Plan section)  Acute Rehab PT Goals Patient Stated Goal: Have his other surgeries completed PT Goal Formulation: With patient Time For Goal Achievement: 06/01/16 Potential to Achieve Goals: Good    Frequency Min 5X/week   Barriers to discharge Inaccessible home environment Staying in an RV but feels they will be fine.    Co-evaluation               End of Session Equipment Utilized During Treatment: Gait belt;Back brace Activity Tolerance: Patient limited by pain Patient left: in chair;with call bell/phone within reach;with chair alarm set;with family/visitor present;with SCD's reapplied Nurse  Communication: Mobility status;Patient requests pain meds         Time: 1030-1057 PT Time Calculation (min) (ACUTE ONLY): 27 min   Charges:   PT Evaluation $PT Eval Moderate Complexity: 1 Procedure PT Treatments $Therapeutic Activity: 8-22 mins   PT G Codes:        Ellouise Newer 05/18/2016, 11:26 AM  Elayne Snare, [place]

## 2016-05-18 NOTE — Progress Notes (Signed)
No acute events Moving legs well Sensation at baseline (decreased on left leg) Ambulate with therapy today

## 2016-05-19 MED ORDER — OXYCODONE-ACETAMINOPHEN 5-325 MG PO TABS: 1.0000 | ORAL_TABLET | ORAL | 0 refills | Status: DC | PRN

## 2016-05-19 MED ORDER — CYCLOBENZAPRINE HCL 10 MG PO TABS: 10.0000 mg | ORAL_TABLET | Freq: Three times a day (TID) | ORAL | 0 refills | Status: DC | PRN

## 2016-05-19 NOTE — Progress Notes (Signed)
CM spoke to the patient and spouse and both declined any HH services or equipment despite recommendations. CM re mains available if needs arise.

## 2016-05-19 NOTE — Discharge Summary (Signed)
Physician Discharge Summary  Patient ID: Mark [name]. MRN: [id number] DOB/AGE: [birth year] 55 y.o.  Admit date: 05/17/2016 Discharge date: 05/19/2016  Admission Diagnoses:  Discharge Diagnoses:  Active Problems:   Spinal stenosis of thoracolumbar region   Discharged Condition: good  Hospital Course: Patient did hospital where he underwent an uncomplicated thoracic decompression infusion. Postoperative use doing well. Preoperative back and lower extremity symptoms much better. Standing and walking without difficulty. Ready for discharge home.  Consults:   Significant Diagnostic Studies:   Treatments:   Discharge Exam: Blood pressure 137/70, pulse 79, temperature 99 F (37.2 C), temperature source Oral, resp. rate 20, height 6\' 1"  (1.854 m), weight (!) 147.9 kg (326 lb), SpO2 100 %. Awake and alert. Oriented and appropriate. Cranial nerve function intact. Motor and sensory function extremities normal. Wound clean and dry. Chest and abdomen benign.  Disposition: 01-Home or Self Care     Medication List    TAKE these medications   ALPRAZolam 0.5 MG tablet Commonly known as:  XANAX Take 1-2 tablets before procedures. What changed:  how much to take  how to take this  when to take this  additional instructions   amphetamine-dextroamphetamine 15 MG 24 hr capsule Commonly known as:  ADDERALL XR Take 15 mg by mouth 2 (two) times daily.   AUBAGIO 14 MG Tabs Generic drug:  Teriflunomide Take 14 mg by mouth at bedtime. Dr Rae Mar   cyclobenzaprine 10 MG tablet Commonly known as:  FLEXERIL Take 1 tablet (10 mg total) by mouth 3 (three) times daily as needed for muscle spasms.   DULoxetine 60 MG capsule Commonly known as:  CYMBALTA Take 60 mg by mouth 2 (two) times daily.   gabapentin 800 MG tablet Commonly known as:  NEURONTIN Take 800 mg by mouth 4 (four) times daily.   HYDROcodone-acetaminophen 10-325 MG tablet Commonly known as:  NORCO Take  2 tablets by mouth every 4 (four) hours as needed for severe pain.   ibuprofen 800 MG tablet Commonly known as:  ADVIL,MOTRIN Take 800 mg by mouth every 8 (eight) hours as needed for moderate pain.   meloxicam 7.5 MG tablet Commonly known as:  MOBIC Take 7.5 mg by mouth 4 (four) times daily.   methylPREDNISolone 4 MG Tbpk tablet Commonly known as:  MEDROL DOSEPAK Take 4-24 mg by mouth See admin instructions. Take these number of tablets on consecutive days:6-5-4-3-2-1   oxyCODONE-acetaminophen 5-325 MG tablet Commonly known as:  PERCOCET/ROXICET Take 1-2 tablets by mouth every 4 (four) hours as needed for moderate pain.   tamsulosin 0.4 MG Caps capsule Commonly known as:  FLOMAX Take 0.4 mg by mouth at bedtime.   tiZANidine 4 MG tablet Commonly known as:  ZANAFLEX Take 4 mg by mouth 4 (four) times daily.      Follow-up Information    CRAM,GARY P, MD .   Specialty:  Neurosurgery Contact information: [address] [place] [address] [phone]        Elaina Hoops, MD .   Specialty:  Neurosurgery Contact information: [address] [place] [address] [phone]           Signed: Charlie Pitter 05/19/2016, 12:16 PM

## 2016-05-19 NOTE — Progress Notes (Signed)
Iv d/c'd. hemovac removed aseptically, dressing reclosed w/intact/occlusive tegaderm. Ready for home.

## 2016-05-22 ENCOUNTER — Encounter (HOSPITAL_COMMUNITY): Payer: Self-pay | Admitting: Neurosurgery

## 2016-06-20 DIAGNOSIS — G992 Myelopathy in diseases classified elsewhere: Secondary | ICD-10-CM | POA: Diagnosis not present

## 2016-06-27 DIAGNOSIS — N529 Male erectile dysfunction, unspecified: Secondary | ICD-10-CM | POA: Diagnosis not present

## 2016-06-27 DIAGNOSIS — N138 Other obstructive and reflux uropathy: Secondary | ICD-10-CM | POA: Diagnosis not present

## 2016-06-27 DIAGNOSIS — E291 Testicular hypofunction: Secondary | ICD-10-CM | POA: Diagnosis not present

## 2016-06-27 DIAGNOSIS — N400 Enlarged prostate without lower urinary tract symptoms: Secondary | ICD-10-CM | POA: Diagnosis not present

## 2016-06-27 DIAGNOSIS — N401 Enlarged prostate with lower urinary tract symptoms: Secondary | ICD-10-CM | POA: Diagnosis not present

## 2016-06-27 DIAGNOSIS — R350 Frequency of micturition: Secondary | ICD-10-CM | POA: Diagnosis not present

## 2016-07-08 DIAGNOSIS — H527 Unspecified disorder of refraction: Secondary | ICD-10-CM | POA: Diagnosis not present

## 2016-08-15 DIAGNOSIS — M5137 Other intervertebral disc degeneration, lumbosacral region: Secondary | ICD-10-CM | POA: Diagnosis not present

## 2016-08-16 DIAGNOSIS — Z9989 Dependence on other enabling machines and devices: Secondary | ICD-10-CM | POA: Diagnosis not present

## 2016-08-16 DIAGNOSIS — G4733 Obstructive sleep apnea (adult) (pediatric): Secondary | ICD-10-CM | POA: Diagnosis not present

## 2016-08-16 DIAGNOSIS — G959 Disease of spinal cord, unspecified: Secondary | ICD-10-CM | POA: Diagnosis not present

## 2016-08-16 DIAGNOSIS — G35 Multiple sclerosis: Secondary | ICD-10-CM | POA: Diagnosis not present

## 2016-08-16 DIAGNOSIS — M4710 Other spondylosis with myelopathy, site unspecified: Secondary | ICD-10-CM | POA: Diagnosis not present

## 2016-08-16 DIAGNOSIS — Z79899 Other long term (current) drug therapy: Secondary | ICD-10-CM | POA: Diagnosis not present

## 2016-08-16 DIAGNOSIS — Z9889 Other specified postprocedural states: Secondary | ICD-10-CM | POA: Diagnosis not present

## 2016-08-20 ENCOUNTER — Encounter: Payer: Self-pay | Admitting: Family Medicine

## 2016-08-20 ENCOUNTER — Ambulatory Visit (INDEPENDENT_AMBULATORY_CARE_PROVIDER_SITE_OTHER): Payer: Medicare Other | Admitting: Family Medicine

## 2016-08-20 ENCOUNTER — Ambulatory Visit (INDEPENDENT_AMBULATORY_CARE_PROVIDER_SITE_OTHER): Payer: Medicare Other

## 2016-08-20 DIAGNOSIS — M791 Myalgia, unspecified site: Secondary | ICD-10-CM

## 2016-08-20 DIAGNOSIS — M79604 Pain in right leg: Secondary | ICD-10-CM

## 2016-08-20 DIAGNOSIS — M160 Bilateral primary osteoarthritis of hip: Secondary | ICD-10-CM | POA: Diagnosis not present

## 2016-08-20 DIAGNOSIS — M79605 Pain in left leg: Secondary | ICD-10-CM

## 2016-08-20 NOTE — Patient Instructions (Signed)
Thank you for coming in today. Get labs and xray today.  We will contact you with results and come up with treatment plan.  Return sooner if needed.    Muscle Pain, Adult Muscle pain (myalgia) may be mild or severe. In most cases, the pain lasts only a short time and it goes away without treatment. It is normal to feel some muscle pain after starting a workout program. Muscles that have not been used often will be sore at first. Muscle pain may also be caused by many other things, including:  Overuse or muscle strain, especially if you are not in shape. This is the most common cause of muscle pain.  Injury.  Bruises.  Viruses, such as the flu.  Infectious diseases.  A chronic condition that causes muscle tenderness, fatigue, and headache (fibromyalgia).  A condition, such as lupus, in which the body's disease-fighting system attacks other organs in the body (autoimmune or rheumatologic diseases).  Certain drugs, including ACE inhibitors and statins. To diagnose the cause of your muscle pain, your health care provider will do a physical exam and ask questions about the pain and when it began. If you have not had muscle pain for very long, your health care provider may want to wait before doing much testing. If your muscle pain has lasted a long time, your health care provider may want to run tests right away. In some cases, this may include tests to rule out certain conditions or illnesses. Treatment for muscle pain depends on the cause. Home care is often enough to relieve muscle pain. Your health care provider may also prescribe anti-inflammatory medicine. Follow these instructions at home: Activity  If overuse is causing your muscle pain:  Slow down your activities until the pain goes away.  Do regular, gentle exercises if you are not usually active.  Warm up before exercising. Stretch before and after exercising. This can help lower the risk of muscle pain.  Do not continue  working out if the pain is very bad. Bad pain could mean that you have injured a muscle. Managing pain and discomfort  If directed, apply ice to the sore muscle:  Put ice in a plastic bag.  Place a towel between your skin and the bag.  Leave the ice on for 20 minutes, 2-3 times a day.  You may also alternate between applying ice and applying heat as told by your health care provider. To apply heat, use the heat source that your health care provider recommends, such as a moist heat pack or a heating pad.  Place a towel between your skin and the heat source.  Leave the heat on for 20-30 minutes.  Remove the heat if your skin turns bright red. This is especially important if you are unable to feel pain, heat, or cold. You may have a greater risk of getting burned. Medicines  Take over-the-counter and prescription medicines only as told by your health care provider.  Do not drive or use heavy machinery while taking prescription pain medicine. Contact a health care provider if:  Your muscle pain gets worse and medicines do not help.  You have muscle pain that lasts longer than 3 days.  You have a rash or fever along with muscle pain.  You have muscle pain after a tick bite.  You have muscle pain while working out, even though you are in good physical condition.  You have redness, soreness, or swelling along with muscle pain.  You have muscle pain  after starting a new medicine or changing the dose of a medicine. Get help right away if:  You have trouble breathing.  You have trouble swallowing.  You have muscle pain along with a stiff neck, fever, and vomiting.  You have severe muscle weakness or cannot move part of your body. This information is not intended to replace advice given to you by your health care provider. Make sure you discuss any questions you have with your health care provider. Document Released: 05/30/2006 Document Revised: 01/26/2016 Document Reviewed:  11/28/2015 Elsevier Interactive Patient Education  2017 [employer].

## 2016-08-21 ENCOUNTER — Encounter: Payer: Self-pay | Admitting: Family Medicine

## 2016-08-21 DIAGNOSIS — D649 Anemia, unspecified: Secondary | ICD-10-CM | POA: Insufficient documentation

## 2016-08-21 LAB — CBC
HEMATOCRIT: 37.6 % — AB (ref 38.5–50.0)
Hemoglobin: 12.2 g/dL — ABNORMAL LOW (ref 13.2–17.1)
MCH: 26.6 pg — AB (ref 27.0–33.0)
MCHC: 32.4 g/dL (ref 32.0–36.0)
MCV: 81.9 fL (ref 80.0–100.0)
MPV: 9.5 fL (ref 7.5–12.5)
PLATELETS: 282 10*3/uL (ref 140–400)
RBC: 4.59 MIL/uL (ref 4.20–5.80)
RDW: 15.4 % — AB (ref 11.0–15.0)
WBC: 6.4 10*3/uL (ref 3.8–10.8)

## 2016-08-21 LAB — COMPLETE METABOLIC PANEL WITH GFR
ALBUMIN: 4.3 g/dL (ref 3.6–5.1)
ALK PHOS: 92 U/L (ref 40–115)
ALT: 14 U/L (ref 9–46)
AST: 15 U/L (ref 10–35)
BILIRUBIN TOTAL: 0.3 mg/dL (ref 0.2–1.2)
BUN: 13 mg/dL (ref 7–25)
CALCIUM: 9.3 mg/dL (ref 8.6–10.3)
CO2: 27 mmol/L (ref 20–31)
CREATININE: 0.8 mg/dL (ref 0.70–1.33)
Chloride: 104 mmol/L (ref 98–110)
GFR, Est African American: >89 mL/min (ref >=60)
GFR, Est Non African American: >89 mL/min (ref >=60)
GLUCOSE: 92 mg/dL (ref 65–99)
POTASSIUM: 4.3 mmol/L (ref 3.5–5.3)
SODIUM: 139 mmol/L (ref 135–146)
TOTAL PROTEIN: 6.7 g/dL (ref 6.1–8.1)

## 2016-08-21 LAB — CK: Total CK: 111 U/L (ref 7–232)

## 2016-08-21 LAB — TSH: TSH: 1.33 mIU/L (ref 0.40–4.50)

## 2016-08-21 NOTE — Progress Notes (Signed)
Mark [name] Brooke Bonito. is a 56 y.o. male who presents to [facility] today for thigh pain. Patient has a several year history of bilateral anterior thigh pain. Pain is worse with prolonged sitting and walking. He has a pertinent orthopedic medical history for multiple lumbar fusions. Most recently in October 2017 he had a T10-L1 fusion to address significant spinal cord compression at T11-L1.  At this point he's had his entire lumbar spine fused through various surgeries. Additionally he has a pertinent past medical history for multiple sclerosis. He notes she's been seen by both his neurologist and his neurosurgeon various issues and has complained of thigh pain which so far has not really been identified. He denies weakness or numbness into his thighs or difficulty walking. She denies any significant or new bowel bladder dysfunction. He denies any fevers or chills.   Past Medical History:  Diagnosis Date  . Anxiety    chronic pain treated with narcotics  . Arthritis   . Chronic pain   . Constipation due to opioid therapy   . Depression   . Dysphonia    thoracic left sided"impingement" with heminumbness  . GI bleed    abt 15 years ago  . Morbid obesity ([place])   . MS (multiple sclerosis) ([place]) 02/11/2013   [name]  . MS (multiple sclerosis) ([place])   . Neuromuscular disorder ([place])   . Pneumonia   . Sleep apnea with use of continuous positive airway pressure (CPAP) 02/11/2013   07-13-12 AHi of 98.6 /hr titrated to 11 cm water ,  average user time 4 hours and 4 minutes. Residual AHI 1.20 December 2012 .  Marland Kitchen Umbilical hernia    Past Surgical History:  Procedure Laterality Date  . CARPAL TUNNEL RELEASE Left    [name]  . COLONOSCOPY W/ POLYPECTOMY    . POSTERIOR LUMBAR FUSION  04/17/15   [name], also removed old hardware  . SPINE SURGERY     total of six vertebras fused, three lumbar surgeries and one anterior neck fusion  .  TONSILLECTOMY     Social History  Substance Use Topics  . Smoking status: Never Smoker  . Smokeless tobacco: Never Used  . Alcohol use Yes     Comment: occasionally     ROS:  As above   Medications: Current Outpatient Prescriptions  Medication Sig Dispense Refill  . ALPRAZolam (XANAX) 0.5 MG tablet Take 1-2 tablets before procedures. (Patient taking differently: Take 0.5-1 mg by mouth as directed. Take 1-2 tablets before procedures.) 10 tablet 0  . amphetamine-dextroamphetamine (ADDERALL XR) 15 MG 24 hr capsule Take 15 mg by mouth 2 (two) times daily.  0  . cyclobenzaprine (FLEXERIL) 10 MG tablet Take 1 tablet (10 mg total) by mouth 3 (three) times daily as needed for muscle spasms. 30 tablet 0  . DULoxetine (CYMBALTA) 60 MG capsule Take 60 mg by mouth 2 (two) times daily.     Marland Kitchen gabapentin (NEURONTIN) 800 MG tablet Take 800 mg by mouth 4 (four) times daily.  11  . HYDROcodone-acetaminophen (NORCO) 10-325 MG per tablet Take 2 tablets by mouth every 4 (four) hours as needed for severe pain.     Marland Kitchen ibuprofen (ADVIL,MOTRIN) 800 MG tablet Take 800 mg by mouth every 8 (eight) hours as needed for moderate pain.    . meloxicam (MOBIC) 7.5 MG tablet Take 7.5 mg by mouth 4 (four) times daily.  11  . methylPREDNISolone (MEDROL DOSEPAK)  4 MG TBPK tablet Take 4-24 mg by mouth See admin instructions. Take these number of tablets on consecutive days:6-5-4-3-2-1  0  . oxyCODONE-acetaminophen (PERCOCET/ROXICET) 5-325 MG tablet Take 1-2 tablets by mouth every 4 (four) hours as needed for moderate pain. 90 tablet 0  . tamsulosin (FLOMAX) 0.4 MG CAPS capsule Take 0.4 mg by mouth at bedtime.     . Teriflunomide (AUBAGIO) 14 MG TABS Take 14 mg by mouth at bedtime. Dr Rae Mar    . tiZANidine (ZANAFLEX) 4 MG tablet Take 4 mg by mouth 4 (four) times daily.      No current facility-administered medications for this visit.    No Known Allergies   Exam:  BP (!) 169/85   Pulse 69   Wt (!) 329 lb  (149.2 kg)   BMI 43.41 kg/m  General: Well Developed, well nourished, and in no acute distress.  Neuro/Psych: Alert and oriented x3, extra-ocular muscles intact, able to move all 4 extremities, sensation grossly intact. Skin: Warm and dry, no rashes noted.  Respiratory: Not using accessory muscles, speaking in full sentences, trachea midline.  Cardiovascular: Pulses palpable, no extremity edema. Abdomen: Does not appear distended. MSK:  Right hip: Normal-appearing nontender. Decreased flexion and rotational range of motion no reproducible thigh pain with hip motion. Left hip: Normal-appearing nontender. Decreased flexion and rotation range of motion without reducing pain. Thigh: No masses palpated. Mildly tender to palpation especially at the distal quadriceps knees bilaterally are nontender with range of motion of 5-120. Intact extension and flexion strength. Antalgic gait.    Results for orders placed or performed in visit on 08/20/16 (from the past 48 hour(s))  CBC     Status: Abnormal   Collection Time: 08/20/16  4:43 PM  Result Value Ref Range   WBC 6.4 3.8 - 10.8 K/uL   RBC 4.59 4.20 - 5.80 MIL/uL   Hemoglobin 12.2 (L) 13.2 - 17.1 g/dL   HCT 37.6 (L) 38.5 - 50.0 %   MCV 81.9 80.0 - 100.0 fL   MCH 26.6 (L) 27.0 - 33.0 pg   MCHC 32.4 32.0 - 36.0 g/dL   RDW 15.4 (H) 11.0 - 15.0 %   Platelets 282 140 - 400 K/uL   MPV 9.5 7.5 - 12.5 fL  CK     Status: None   Collection Time: 08/20/16  4:43 PM  Result Value Ref Range   Total CK 111 7 - 232 U/L  TSH     Status: None   Collection Time: 08/20/16  4:43 PM  Result Value Ref Range   TSH 1.33 0.40 - 4.50 mIU/L  COMPLETE METABOLIC PANEL WITH GFR     Status: None   Collection Time: 08/20/16  4:43 PM  Result Value Ref Range   Sodium 139 135 - 146 mmol/L   Potassium 4.3 3.5 - 5.3 mmol/L   Chloride 104 98 - 110 mmol/L   CO2 27 20 - 31 mmol/L   Glucose, Bld 92 65 - 99 mg/dL   BUN 13 7 - 25 mg/dL   Creat 0.80 0.70 - 1.33 mg/dL     Comment:   For patients > or = 55 years of age: The upper reference limit for Creatinine is approximately 13% higher for people identified as African-American.      Total Bilirubin 0.3 0.2 - 1.2 mg/dL   Alkaline Phosphatase 92 40 - 115 U/L   AST 15 10 - 35 U/L   ALT 14 9 - 46 U/L  Total Protein 6.7 6.1 - 8.1 g/dL   Albumin 4.3 3.6 - 5.1 g/dL   Calcium 9.3 8.6 - 10.3 mg/dL   GFR, Est African American >89 >=60 mL/min   GFR, Est Non African American >89 >=60 mL/min   No results found.    Assessment and Plan: 56 y.o. male with  Thigh pain bilaterally. No obvious metabolic cause based on labs listed above. Additionally he doesn't take any medications that are known to be toxic to muscles. A pelvis x-ray is pending she may show hip arthritis.   think either lumbar radiculopathy or hip DJD is the likely cause of pain.    we will contact patient with results are back. Consider diagnostic and therapeutic injection in the future    Orders Placed This Encounter  Procedures  . DG Pelvis 1-2 Views    Standing Status:   Future    Number of Occurrences:   1    Standing Expiration Date:   10/18/2017    Order Specific Question:   Reason for Exam (SYMPTOM  OR DIAGNOSIS REQUIRED)    Answer:   eval thigh pain ? hip djd    Order Specific Question:   Preferred imaging location?    Answer:   [facility]  . CBC  . CK  . TSH  . COMPLETE METABOLIC PANEL WITH GFR    Discussed warning signs or symptoms. Please see discharge instructions. Patient expresses understanding.

## 2016-09-09 DIAGNOSIS — R208 Other disturbances of skin sensation: Secondary | ICD-10-CM | POA: Insufficient documentation

## 2016-10-02 DIAGNOSIS — G4733 Obstructive sleep apnea (adult) (pediatric): Secondary | ICD-10-CM | POA: Diagnosis not present

## 2016-10-03 DIAGNOSIS — G992 Myelopathy in diseases classified elsewhere: Secondary | ICD-10-CM | POA: Diagnosis not present

## 2016-10-03 DIAGNOSIS — M5023 Other cervical disc displacement, cervicothoracic region: Secondary | ICD-10-CM | POA: Diagnosis not present

## 2016-10-03 DIAGNOSIS — M5137 Other intervertebral disc degeneration, lumbosacral region: Secondary | ICD-10-CM | POA: Diagnosis not present

## 2016-10-03 DIAGNOSIS — R030 Elevated blood-pressure reading, without diagnosis of hypertension: Secondary | ICD-10-CM | POA: Diagnosis not present

## 2016-10-08 ENCOUNTER — Other Ambulatory Visit: Payer: Self-pay | Admitting: Neurosurgery

## 2016-10-08 DIAGNOSIS — G35 Multiple sclerosis: Secondary | ICD-10-CM

## 2016-10-21 ENCOUNTER — Ambulatory Visit
Admission: RE | Admit: 2016-10-21 | Discharge: 2016-10-21 | Disposition: A | Discharge status: Home / Self Care | Payer: Medicare Other | Source: Ambulatory Visit | Attending: Neurosurgery | Admitting: Neurosurgery

## 2016-10-21 DIAGNOSIS — G35 Multiple sclerosis: Secondary | ICD-10-CM

## 2016-10-21 DIAGNOSIS — M4802 Spinal stenosis, cervical region: Secondary | ICD-10-CM | POA: Diagnosis not present

## 2016-10-21 DIAGNOSIS — M4804 Spinal stenosis, thoracic region: Secondary | ICD-10-CM | POA: Diagnosis not present

## 2016-10-21 MED ORDER — GADOBENATE DIMEGLUMINE 529 MG/ML IV SOLN
20.0000 mL | Freq: Once | INTRAVENOUS | Status: AC | PRN
  Administered 2016-10-21: 20 mL via INTRAVENOUS

## 2016-10-23 DIAGNOSIS — M25562 Pain in left knee: Secondary | ICD-10-CM | POA: Diagnosis not present

## 2016-10-23 DIAGNOSIS — M25561 Pain in right knee: Secondary | ICD-10-CM | POA: Diagnosis not present

## 2016-10-23 DIAGNOSIS — M170 Bilateral primary osteoarthritis of knee: Secondary | ICD-10-CM | POA: Diagnosis not present

## 2016-10-28 DIAGNOSIS — M5137 Other intervertebral disc degeneration, lumbosacral region: Secondary | ICD-10-CM | POA: Diagnosis not present

## 2016-10-28 DIAGNOSIS — Z6841 Body Mass Index (BMI) 40.0 and over, adult: Secondary | ICD-10-CM | POA: Diagnosis not present

## 2016-11-04 DIAGNOSIS — M47818 Spondylosis without myelopathy or radiculopathy, sacral and sacrococcygeal region: Secondary | ICD-10-CM | POA: Diagnosis not present

## 2016-11-04 DIAGNOSIS — M519 Unspecified thoracic, thoracolumbar and lumbosacral intervertebral disc disorder: Secondary | ICD-10-CM | POA: Diagnosis not present

## 2016-11-04 DIAGNOSIS — Z981 Arthrodesis status: Secondary | ICD-10-CM | POA: Diagnosis not present

## 2016-11-05 DIAGNOSIS — G35 Multiple sclerosis: Secondary | ICD-10-CM | POA: Diagnosis not present

## 2016-11-05 DIAGNOSIS — M5124 Other intervertebral disc displacement, thoracic region: Secondary | ICD-10-CM | POA: Diagnosis not present

## 2016-11-05 DIAGNOSIS — G992 Myelopathy in diseases classified elsewhere: Secondary | ICD-10-CM | POA: Diagnosis not present

## 2016-11-18 DIAGNOSIS — M5124 Other intervertebral disc displacement, thoracic region: Secondary | ICD-10-CM | POA: Diagnosis not present

## 2016-11-26 ENCOUNTER — Ambulatory Visit: Payer: Medicare Other | Admitting: Nurse Practitioner

## 2016-12-10 DIAGNOSIS — M5124 Other intervertebral disc displacement, thoracic region: Secondary | ICD-10-CM | POA: Diagnosis not present

## 2016-12-11 ENCOUNTER — Ambulatory Visit (INDEPENDENT_AMBULATORY_CARE_PROVIDER_SITE_OTHER): Payer: Medicare Other | Admitting: Nurse Practitioner

## 2016-12-11 ENCOUNTER — Encounter (INDEPENDENT_AMBULATORY_CARE_PROVIDER_SITE_OTHER): Payer: Self-pay

## 2016-12-11 ENCOUNTER — Encounter: Payer: Self-pay | Admitting: Nurse Practitioner

## 2016-12-11 VITALS — BP 137/86 | HR 74 | Wt 315.4 lb

## 2016-12-11 DIAGNOSIS — G4730 Sleep apnea, unspecified: Secondary | ICD-10-CM

## 2016-12-11 DIAGNOSIS — G4733 Obstructive sleep apnea (adult) (pediatric): Secondary | ICD-10-CM

## 2016-12-11 NOTE — Patient Instructions (Signed)
Patient  will bring CPAP chip in for download If compliance ok can follow up yearly

## 2016-12-11 NOTE — Progress Notes (Signed)
[facility]  PATIENT: Mark [name]. DOB: Dec 16, 1960   REASON FOR VISIT: follow up for obstructive sleep apnea, obesity HISTORY FROM:patient    HISTORY OF PRESENT ILLNESS:  HISTORY 02/16/14 [facility]): Mark [name] has been able to reduce his BMI and reduce thus his degree of OSA. He lost 24 pounds. His CPAP download today was not possible , he forgot the machine or memory chip. He is followed by [facility] . He has MS and is on Aubagio, noted delayed wound healing. He is fatigued , he is disabled. He has irregular sleep habits , based on his hobby. He goes to bed at 2 AM and Goes to sleep promptly , he rises at 8 AM but wears his CPAP nightly. He drinks about a gallon of caffeinated sweet tea a day, but no coffee and also rarely soda. Nonsmoker and nondrinker. The patient for a score prior to his last sleep study from 07-13-12 was 15 points and is becks inventory 25 points the patient now endorsed the Epworth sleepiness score at 7 points on CPAP and depression score at 0 points. Last visit note at [facility], [facility] : Mark [name] has been a long time user of CPAP since early 2008. In December 2013 he returned upon request of his DME company-i.[employer] for a sleep consult. The patient had stated that sleepiness had increased but he also was on chronic pain- not sleeping well at baseline and getting an average of 4 hours sleep. His old CPAP machine setting was not known and the memory card was not readable at the time of our encounter.The patient returned for a sleep study on 07/13/2012 and tested positive for a severe obstructive sleep apnea, his AHI was 96.1, his RDI 98.6.  Oxygen saturation at nadir was 79% but only for 17.4 minutes.The patient's study was SPLIT and CPAP initiated and titrated to 11 cm water.He had no central apneas and response to the titration in his AHI was significantly reduced.He used and nasal comfort gel mask. EPR was set at 3 cm  water. The patient also had a download from the CPAP in spring of this year ( 2014) showing borderline compliance.I have another download available for the last month and his average daily used to time is 4 hours 4 minutes so he is just concerned compliant. His AHI is now 1.1, his median airleak is moderate 4.8 L a minute. He states that he still is not able to sleep longer by using CPAP at the CPAP has significantly reduced his snoring and apneic events his fatigue severity score today is 48 points , his Epworth score is 6 points -reduced in comparison to his last visit. His March 14 download showed an AHI of 1.4 and June a residual AHI of 1.2.Is noted that if he doesn't wear his machine and mask he awakens with a very sore throat from the loud , thunderous snoring.His bedtime is 1 AM to 6 AM , will only get 4 hours average of nightly sleep- all his life , not a recent He has no cyclic sleep pattern, no depression history , and had a cervical fusion. He has frequent pneumonia , rhinitis, and rarely a sinus infection.  He just received disability 2014 for chronic pain, and MS .I also learned that Mark [name] who has undergone multiple back surgeries in the past and has a history of chronic back pain,and was diagnosed with a neurogenic bladder, developed dysesthesias earlier this year and was diagnosed by MRI  with multiple sclerosis. He is treated by Dr. Rae Mar , who has started him on Aubagio.  CDMr. [name] is seen today for a CPAP follow-up visit on 11-23-14. He has a diagnosis of obstructive sleep apnea as described above he is on CPAP currently using 11 cm water with full-time EPR of 3 cm water. At download over the last January was obtained. At the time he had 93% compliance for days of use and 63% compliance for over 4 hours of use his average daily time of use is 4 hours and 27 minutes and his residual AHI is 1.1 there is a high air leak which may be explained by the patient's facial  hair. His Epworth sleepiness score is 3 points and his fatigue severity score 63 points. The patient's machine today did not have a downloadable chip in it so we are calling his DME which is [facility] in [place]. In addition his high degree of fatigue can be relaxed related to his multiple sclerosis. He reports that he can hardly move his legs his legs just do not want to work cord" he does not have major pain with it but he is weak and in coordinated. He has fallen a couple of times. Also has right puffy ankles.He continues to take AUBAGi0 and is followed by Dr. Doyle Askew. He has been prescribed Adderall by Dr. Doyle Askew but he feels that he needs a higher dose or perhaps a more frequent dosing. His orthopedist prescribed NARCO, 120 tab at a time.  UPDATE 05/04/2017[name], 56 year old male returns for follow-up. He has history of obstructive sleep apnea with CPAP. His compliance is 90% at 27 days his average usage is 4 hours and 8 minutes.  However he only uses his machine for > 4 hours 13/27 days- 43 %. Leak is minimal..AHI of 1.1 at 11 cm of water,  His Epworth score is 4 points. His fatigue severity score is 55  Patient states that his fatigue is due to MS . Dr. Tressa Busman in [place] follows him for his MS. Patient reports that he gets about 10 hours of sleep a night. He goes to bed around 11-12 PM and arises at 10-11 AM. He has puffy ankles. He returns for reevaluation UPDATE 05/23/2018[name], 56 year old male returns for follow-up with history of obstructive sleep apnea with CPAP. He did not bring his chip to download however he claims he is compliant with CPAP  machine every day.  His Epworth score is 8. He continues to be followed by Dr. Tressa Busman in [place] for his multiple sclerosis. He is being seen at [facility] for pain injections. He has had previous back surgeries. He returns for reevaluation REVIEW OF SYSTEMS: Full 14 system review of systems performed and notable only  for those listed, all others are neg:  Constitutional: fatigue  Cardiovascular: neg Ear/Nose/Throat: neg  Skin: neg Eyes: neg Respiratory: neg Gastroitestinal: neg  Hematology/Lymphatic: neg  Endocrine: neg Musculoskeletal: Back pain joint pain Allergy/Immunology: neg Neurological: neg Psychiatric: neg Sleep : obstructive sleep apnea with CPAP   ALLERGIES: No Known Allergies  HOME MEDICATIONS: Outpatient Medications Prior to Visit  Medication Sig Dispense Refill  . amphetamine-dextroamphetamine (ADDERALL XR) 15 MG 24 hr capsule Take 15 mg by mouth 2 (two) times daily.  0  . DULoxetine (CYMBALTA) 60 MG capsule Take 60 mg by mouth 2 (two) times daily.     Marland Kitchen gabapentin (NEURONTIN) 800 MG tablet Take 800 mg by mouth 4 (four) times daily.  11  .  HYDROcodone-acetaminophen (NORCO) 10-325 MG per tablet Take 2 tablets by mouth every 4 (four) hours as needed for severe pain.     Marland Kitchen ibuprofen (ADVIL,MOTRIN) 800 MG tablet Take 800 mg by mouth every 8 (eight) hours as needed for moderate pain.    . tamsulosin (FLOMAX) 0.4 MG CAPS capsule Take 0.4 mg by mouth at bedtime.     . Teriflunomide (AUBAGIO) 14 MG TABS Take 14 mg by mouth at bedtime. Dr Rae Mar    . tiZANidine (ZANAFLEX) 4 MG tablet Take 4 mg by mouth 4 (four) times daily.     Marland Kitchen ALPRAZolam (XANAX) 0.5 MG tablet Take 1-2 tablets before procedures. (Patient not taking: Reported on 12/11/2016) 10 tablet 0  . cyclobenzaprine (FLEXERIL) 10 MG tablet Take 1 tablet (10 mg total) by mouth 3 (three) times daily as needed for muscle spasms. (Patient not taking: Reported on 12/11/2016) 30 tablet 0  . meloxicam (MOBIC) 7.5 MG tablet Take 7.5 mg by mouth 4 (four) times daily.  11  . methylPREDNISolone (MEDROL DOSEPAK) 4 MG TBPK tablet Take 4-24 mg by mouth See admin instructions. Take these number of tablets on consecutive days:6-5-4-3-2-1  0  . oxyCODONE-acetaminophen (PERCOCET/ROXICET) 5-325 MG tablet Take 1-2 tablets by mouth every 4 (four)  hours as needed for moderate pain. (Patient not taking: Reported on 12/11/2016) 90 tablet 0   No facility-administered medications prior to visit.     PAST MEDICAL HISTORY: Past Medical History:  Diagnosis Date  . Anxiety    chronic pain treated with narcotics  . Arthritis   . Chronic pain   . Constipation due to opioid therapy   . Depression   . Dysphonia    thoracic left sided"impingement" with heminumbness  . GI bleed    abt 15 years ago  . Morbid obesity ([place])   . MS (multiple sclerosis) ([place]) 02/11/2013   [name]  . MS (multiple sclerosis) ([place])   . Neuromuscular disorder ([place])   . Pneumonia   . Sleep apnea with use of continuous positive airway pressure (CPAP) 02/11/2013   07-13-12 AHi of 98.6 /hr titrated to 11 cm water ,  average user time 4 hours and 4 minutes. Residual AHI 1.20 December 2012 .  Marland Kitchen Umbilical hernia     PAST SURGICAL HISTORY: Past Surgical History:  Procedure Laterality Date  . CARPAL TUNNEL RELEASE Left    [name]  . COLONOSCOPY W/ POLYPECTOMY    . POSTERIOR LUMBAR FUSION  04/17/15   [name], also removed old hardware  . SPINE SURGERY     total of six vertebras fused, three lumbar surgeries and one anterior neck fusion  . TONSILLECTOMY      FAMILY HISTORY: Family History  Problem Relation Age of Onset  . Cancer Other   . Cancer Other   . Sleep walking Son     SOCIAL HISTORY: Social History   Social History  . Marital status: Married    Spouse name: [name]  . Number of children: 2  . Years of education: 12+   Occupational History  . Not on file.   Social History Main Topics  . Smoking status: Never Smoker  . Smokeless tobacco: Never Used  . Alcohol use Yes     Comment: occasionally  . Drug use: No  . Sexual activity: Not on file   Other Topics Concern  . Not on file   Social History Narrative   Last download from 2011 showed good response to  12 cm water with 3 cm EPR, but we have no record of the baseline AI.     Need to order  the old paper copies and send patient for retitration  to lab, after five years plus this will be a SPLIT at 15 and at 3% score. This calendar year preferred for deductible costs.    Patient is married [name]) and lives at home with his wife, daughter, son-in-law and grandchild.   Patient has two adult children.   Patient is right-handed.   Patient is disabled.   Patient has a college education.   Patient drinks a gallon of tea daily.           PHYSICAL EXAM  Vitals:   12/11/16 1357  BP: 137/86  Pulse: 74  Weight: (!) 315 lb 6.4 oz (143.1 kg)   Body mass index is 41.61 kg/m. Generalized: Well developed, obese male in no acute distress  Neck: Supple, Mallampati 3+   Neurological examination  Mentation: Alert oriented to time, place, history taking. Follows all commands speech and language fluentESS 8 Cranial nerve II-XII: Pupils were equal round reactive to light. Extraocular movements were full, visual field were full on confrontational test. Facial sensation and strength were normal. Uvula tongue midline. Head turning and shoulder shrug were normal and symmetric. Motor: The motor testing reveals 5 over 5 strength of all 4 extremities. Good symmetric motor tone is noted throughout.  Sensory: Sensory testing is intact to soft touch on all 4 extremities except decreased on the left upper and lower extremities. No evidence of extinction is noted.  Coordination: Cerebellar testing reveals good finger-nose-finger and heel-to-shin bilaterally. No tremor  Gait and station: Gait is wide based, very cautious and unsteady. No assistive device Reflexes: Deep tendon reflexes are symmetric but depressed throughout  DIAGNOSTIC DATA (LABS, IMAGING, TESTING) - I reviewed patient records, labs, notes, testing and imaging myself where available.  Lab Results  Component Value Date   WBC 6.4 08/20/2016   HGB 12.2 (L) 08/20/2016   HCT 37.6 (L) 08/20/2016   MCV 81.9  08/20/2016   PLT 282 08/20/2016      Component Value Date/Time   NA 139 08/20/2016 1643   K 4.3 08/20/2016 1643   CL 104 08/20/2016 1643   CO2 27 08/20/2016 1643   GLUCOSE 92 08/20/2016 1643   BUN 13 08/20/2016 1643   CREATININE 0.80 08/20/2016 1643   CALCIUM 9.3 08/20/2016 1643   PROT 6.7 08/20/2016 1643   ALBUMIN 4.3 08/20/2016 1643   AST 15 08/20/2016 1643   ALT 14 08/20/2016 1643   ALKPHOS 92 08/20/2016 1643   BILITOT 0.3 08/20/2016 1643   GFRNONAA >89 08/20/2016 1643   GFRAA >89 08/20/2016 1643     Lab Results  Component Value Date   TSH 1.33 08/20/2016      ASSESSMENT AND PLAN 76 -year-old male  has a past medical history of Morbid obesity; Dysphonia; Chronic pain; Depression; Anxiety; MS (multiple sclerosis) (02/11/2013);  and Sleep apnea with use of continuous positive airway pressure (CPAP) (02/11/2013). Hereto follow-up for his CPAP compliance. Patient did not bring his chip for download however he claims he is compliance with machine every day. ESS score 8  PLAN: Patient  will bring CPAP chip in for download If compliance ok can follow up yearly Dennie Bible, [facility], [facility], [facility] [address] [place], [place] [address] [phone]

## 2016-12-11 NOTE — Progress Notes (Signed)
I agree with the assessment and plan as directed by NP .   Beyza Bellino, MD  

## 2016-12-16 ENCOUNTER — Encounter: Payer: Self-pay | Admitting: Nurse Practitioner

## 2016-12-26 DIAGNOSIS — R350 Frequency of micturition: Secondary | ICD-10-CM | POA: Diagnosis not present

## 2016-12-26 DIAGNOSIS — N529 Male erectile dysfunction, unspecified: Secondary | ICD-10-CM | POA: Diagnosis not present

## 2016-12-26 DIAGNOSIS — E291 Testicular hypofunction: Secondary | ICD-10-CM | POA: Diagnosis not present

## 2016-12-26 DIAGNOSIS — N401 Enlarged prostate with lower urinary tract symptoms: Secondary | ICD-10-CM | POA: Diagnosis not present

## 2016-12-30 ENCOUNTER — Telehealth: Payer: Self-pay | Admitting: Nurse Practitioner

## 2016-12-30 NOTE — Telephone Encounter (Signed)
I called pt, discuss cpap compliance expectations with the pt. He is agreeable to increasing his usage to at least 4 hours every night and bringing the chip in for a repeat download in 2 months. Pt verbalized understanding of recommendations.

## 2016-12-30 NOTE — Telephone Encounter (Signed)
CPAP compliance greater than 4 hours is 12 days for 40%. This needs to be at least 70% Usage days 19 out of 30 Days less than 4 hours 7 or 23% Pressure 11 cm EPR level 3 AHI 0.8 Please increase CPAP compliance and bring chip in in 2 months for repeat compliance report. Please call the patient

## 2016-12-31 DIAGNOSIS — M5124 Other intervertebral disc displacement, thoracic region: Secondary | ICD-10-CM | POA: Diagnosis not present

## 2017-03-13 DIAGNOSIS — M5124 Other intervertebral disc displacement, thoracic region: Secondary | ICD-10-CM | POA: Diagnosis not present

## 2017-04-02 DIAGNOSIS — M5124 Other intervertebral disc displacement, thoracic region: Secondary | ICD-10-CM | POA: Diagnosis not present

## 2017-04-02 DIAGNOSIS — Z6841 Body Mass Index (BMI) 40.0 and over, adult: Secondary | ICD-10-CM | POA: Diagnosis not present

## 2017-04-02 DIAGNOSIS — R030 Elevated blood-pressure reading, without diagnosis of hypertension: Secondary | ICD-10-CM | POA: Diagnosis not present

## 2017-04-10 DIAGNOSIS — M25562 Pain in left knee: Secondary | ICD-10-CM | POA: Diagnosis not present

## 2017-04-10 DIAGNOSIS — M170 Bilateral primary osteoarthritis of knee: Secondary | ICD-10-CM | POA: Diagnosis not present

## 2017-04-10 DIAGNOSIS — M25561 Pain in right knee: Secondary | ICD-10-CM | POA: Diagnosis not present

## 2017-04-16 DIAGNOSIS — Z5181 Encounter for therapeutic drug level monitoring: Secondary | ICD-10-CM | POA: Diagnosis not present

## 2017-04-16 DIAGNOSIS — G8220 Paraplegia, unspecified: Secondary | ICD-10-CM | POA: Diagnosis not present

## 2017-04-16 DIAGNOSIS — G35 Multiple sclerosis: Secondary | ICD-10-CM | POA: Diagnosis not present

## 2017-04-16 DIAGNOSIS — G8381 Brown-Sequard syndrome: Secondary | ICD-10-CM | POA: Diagnosis not present

## 2017-05-05 ENCOUNTER — Ambulatory Visit (INDEPENDENT_AMBULATORY_CARE_PROVIDER_SITE_OTHER): Payer: Medicare Other | Admitting: Family Medicine

## 2017-05-05 ENCOUNTER — Encounter: Payer: Self-pay | Admitting: Family Medicine

## 2017-05-05 VITALS — BP 119/80 | HR 96 | Ht 73.0 in | Wt 317.0 lb

## 2017-05-05 DIAGNOSIS — L819 Disorder of pigmentation, unspecified: Secondary | ICD-10-CM

## 2017-05-05 DIAGNOSIS — B070 Plantar wart: Secondary | ICD-10-CM | POA: Diagnosis not present

## 2017-05-05 DIAGNOSIS — D229 Melanocytic nevi, unspecified: Secondary | ICD-10-CM

## 2017-05-05 DIAGNOSIS — L821 Other seborrheic keratosis: Secondary | ICD-10-CM | POA: Diagnosis not present

## 2017-05-05 DIAGNOSIS — L918 Other hypertrophic disorders of the skin: Secondary | ICD-10-CM

## 2017-05-05 NOTE — Progress Notes (Signed)
Subjective:    Patient ID: Mark [name]., male    DOB: 17-Oct-1960, 56 y.o.   MRN: [id number]  HPI  He has a few skin lesion on his right upper back he would like me to look at. Says they have been a little itchy.    Also c/o of a plantar wart on arch of left foot. Has tried "digging" it out previously.  Tries to keep the dead skin down by sanding it down.    He also has some skin tags under the axilla, left hip and right groin crease.   Has a darker lesion that has been changing under the right axilla that he wants looked at.  He has a family hx of melanoma.    Review of Systems     Objective:   Physical Exam  Constitutional: He is oriented to person, place, and time. He appears well-developed and well-nourished.  HENT:  Head: Normocephalic and atraumatic.  Eyes: Conjunctivae and EOM are normal.  Cardiovascular: Normal rate.   Pulmonary/Chest: Effort normal.  Neurological: He is alert and oriented to person, place, and time.  Skin: Skin is dry. No pallor.  He had a few very small seborrheic keratosis on the right upper back shoulder area. In the right axilla he had an irregular border raised dark brown mole. Possible irritated seborrheic keratosis versus atypical nevus. On the sole of the left foot he had a corn with some callus on the surface. He also had skin tags as noted below.  Psychiatric: He has a normal mood and affect. His behavior is normal.  Vitals reviewed.         Assessment & Plan:  Skin Tag Removal Procedure Note  Pre-operative Diagnosis: Classic skin tags (acrochordon)  Post-operative Diagnosis: Classic skin tags (acrochordon)  Locations:1 in right axilla, left hip, right inner groin crease and 3 in the left axilla.   Indications: irritation  Anesthesia: none  Procedure Details  The risks (including bleeding and infection) and benefits of the procedure and Verbal informed consent obtained. Using sterile iris scissors, multiple skin tags  were snipped off at their bases after cleansing with Betadine.  Bleeding was controlled by pressure.   Findings: Pathognomonic benign lesions  not sent for pathological exam.  Condition: Stable  Complications: none.  Plan: 1. Instructed to keep the wounds dry and covered for 24-48h and clean thereafter. 2. Warning signs of infection were reviewed.   3. Recommended that the patient use OTC acetaminophen as needed for pain.  4. Return as needed.   Shave Biopsy Procedure Note  Pre-operative Diagnosis: Suspicious lesion  Post-operative Diagnosis: same  Locations:right axilla  Indications: change in mole  Anesthesia: Lidocaine 1% with epinephrine without added sodium bicarbonate  Procedure Details  Patient informed of the risks (including bleeding and infection) and benefits of the  procedure and Verbal informed consent obtained.  The lesion and surrounding area were given a sterile prep using chlorhexidine and draped in the usual sterile fashion. A scalpel was used to shave an area of skin approximately 1cm by 1cm.  Hemostasis achieved with alumuninum chloride. Antibiotic ointment and a sterile dressing applied.  The specimen was sent for pathologic examination. The patient tolerated the procedure well.  EBL: trace blood  Findings: Await pathology  Condition: Stable  Complications: none.  Plan: 1. Instructed to keep the wound dry and covered for 24-48h and clean thereafter. 2. Warning signs of infection were reviewed.   3. Recommended that the patient use OTC  acetaminophen as needed for pain.  4. Return PRN.    Cryotherapy Procedure Note  Pre-operative Diagnosis: plantar wart and seborrheic keratosis  Post-operative Diagnosis: same  Locations: solfe of left foot   Indications: plantar wart.   Anesthesia: not required    Procedure Details  Patient informed of risks (permanent scarring, infection, light or dark discoloration, bleeding, infection, weakness,  numbness and recurrence of the lesion) and benefits of the procedure and verbal informed consent obtained.  The areas are treated with liquid nitrogen therapy, frozen until ice ball extended 2 mm beyond lesion, allowed to thaw, and treated again. The patient tolerated procedure well.  The patient was instructed on post-op care, warned that there may be blister formation, redness and pain. Recommend OTC analgesia as needed for pain.  Condition: Stable  Complications: none.  Plan: 1. Instructed to keep the area dry and covered for 24-48h and clean thereafter. 2. Warning signs of infection were reviewed.   3. Recommended that the patient use OTC acetaminophen as needed for pain.  4. Return PRN

## 2017-05-06 DIAGNOSIS — D225 Melanocytic nevi of trunk: Secondary | ICD-10-CM | POA: Diagnosis not present

## 2017-05-26 ENCOUNTER — Encounter: Payer: Self-pay | Admitting: Osteopathic Medicine

## 2017-05-26 ENCOUNTER — Ambulatory Visit (INDEPENDENT_AMBULATORY_CARE_PROVIDER_SITE_OTHER): Payer: Medicare Other | Admitting: Osteopathic Medicine

## 2017-05-26 VITALS — BP 150/100 | HR 81 | Temp 98.3°F | Ht 73.0 in | Wt 319.3 lb

## 2017-05-26 DIAGNOSIS — R42 Dizziness and giddiness: Secondary | ICD-10-CM

## 2017-05-26 DIAGNOSIS — R030 Elevated blood-pressure reading, without diagnosis of hypertension: Secondary | ICD-10-CM

## 2017-05-26 DIAGNOSIS — G35 Multiple sclerosis: Secondary | ICD-10-CM | POA: Diagnosis not present

## 2017-05-26 MED ORDER — MECLIZINE HCL 25 MG PO TABS: 25.0000 mg | ORAL_TABLET | Freq: Three times a day (TID) | ORAL | 0 refills | Status: DC | PRN

## 2017-05-26 NOTE — Progress Notes (Addendum)
HPI: Mark Enrique. is a 56 y.o. male with PMH  has a past medical history of Anxiety, Arthritis, Chronic pain, Constipation due to opioid therapy, Depression, Dysphonia, GI bleed, Morbid obesity ([place]), MS (multiple sclerosis) ([place]) (02/11/2013), MS (multiple sclerosis) ([place]), Neuromuscular disorder ([place]), Pneumonia, Sleep apnea with use of continuous positive airway pressure (CPAP) (5/85/2778), and Umbilical hernia.  who presents to [facility] today, 05/26/17,  for chief complaint of:  Chief Complaint  Patient presents with  . Dizziness  . Nausea     Feeling "out of sorts" starting yesterday. Woke up with having to pee "feeling drunk" like lightheaded and nauseous. Feeling is a bit better but persisted. He is very worried as he never really has dizziness or feeling bad.   Concerned about his BP possibly being the issue. 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 He also checked BS at friend's, 130 after eating. BP there was 160/90 on home monitor. Was 156/93 at [facility] just before this visit.   Head feels swimming/lighteaded as opposed to spinning. Worse with sitting/standing. No vision changes. No chest pains. No shortness of breath.   No hx HTN but BP has been high on occasion in the past. Hx anxiety. Hx MS maintained on [place], following w/ Neuro.      Past medical, surgical, social and family history reviewed:  Patient Active Problem List   Diagnosis Date Noted  . Anemia 08/21/2016  . Myalgia 08/20/2016  . Spinal stenosis of thoracolumbar region 05/17/2016  . Left lumbar radiculitis 02/13/2016  . Morbid obesity ([place]) 01/03/2016  . Elevated blood pressure 01/03/2016  . Cough 10/02/2015  . HNP (herniated nucleus pulposus), lumbar 04/17/2015  . Hearing loss 12/13/2014  . OSA (obstructive sleep apnea) 11/23/2014  . Severe obesity (BMI >= 40) ([place]) 11/23/2014  . 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 Neuropathy 10/25/2013  . Obstructive apnea  10/25/2013  . Cervical osteoarthritis 09/15/2013  . Degenerative arthritis of lumbar spine 09/15/2013  . Adiposity 09/15/2013  . Benign neoplasm of soft tissues of abdomen 09/15/2013  . MS (multiple sclerosis) ([place]) 02/11/2013  . Sleep apnea with use of continuous positive airway pressure (CPAP) 02/11/2013  . DEPRESSION 08/18/2007  . UNSPECIFIED VENOUS INSUFFICIENCY 08/18/2007  . BACK PAIN, CHRONIC 08/18/2007  . NEUROGENIC BLADDER 08/10/2007  . LEG EDEMA, BILATERAL 08/03/2007    Past Surgical History:  Procedure Laterality Date  . CARPAL TUNNEL RELEASE Left    [name]  . COLONOSCOPY W/ POLYPECTOMY    . POSTERIOR LUMBAR FUSION  04/17/15   [name], also removed old hardware  . SPINE SURGERY     total of six vertebras fused, three lumbar surgeries and one anterior neck fusion  . TONSILLECTOMY      Social History   Tobacco Use  . Smoking status: Never Smoker  . Smokeless tobacco: Never Used  Substance Use Topics  . Alcohol use: Yes    Comment: occasionally    Family History  Problem Relation Age of Onset  . Cancer Other   . Cancer Other   . Sleep walking Son      Current medication list and allergy/intolerance information reviewed:    Current Outpatient Medications  Medication Sig Dispense Refill  . amphetamine-dextroamphetamine (ADDERALL XR) 30 MG 24 hr capsule Take 30 mg by mouth.    . diazepam (VALIUM) 5 MG tablet TAKE 1-4 TABLETS BY MOUTH AS NEEDED FOR MRI  0  . DULoxetine (CYMBALTA) 60 MG capsule Take 60 mg by mouth  2 (two) times daily.     Marland Kitchen gabapentin (NEURONTIN) 800 MG tablet Take 800 mg by mouth 4 (four) times daily.  11  . HYDROcodone-acetaminophen (NORCO) 10-325 MG per tablet Take 2 tablets by mouth every 4 (four) hours as needed for severe pain.     Marland Kitchen ibuprofen (ADVIL,MOTRIN) 800 MG tablet Take 800 mg by mouth every 8 (eight) hours as needed for moderate pain.    . tamsulosin (FLOMAX) 0.4 MG CAPS capsule Take 0.4 mg by mouth at bedtime.     .  Teriflunomide (AUBAGIO) 14 MG TABS Take 14 mg by mouth at bedtime. Dr Rae Mar    . tiZANidine (ZANAFLEX) 4 MG tablet Take 4 mg by mouth 4 (four) times daily.     . Turmeric Curcumin 500 MG CAPS Take 500 mg by mouth.     No current facility-administered medications for this visit.     No Known Allergies    Review of Systems:  Constitutional:  No  fever, no chills, No recent illness, No unintentional weight changes. +significant fatigue.   HEENT: No  headache, no vision change - transient blurry when feeling lightheaded, no hearing change, No sore throat,   Cardiac: No  chest pain, No  pressure, No palpitations,  Respiratory:  No  shortness of breath. No  Cough  Gastrointestinal: No  abdominal pain, +nausea, No  vomiting,  No  blood in stool, No  diarrhea, No  constipation   Musculoskeletal: No new myalgia/arthralgia  Hem/Onc: No  easy bruising/bleeding  Endocrine: No cold intolerance,  No heat intolerance. +polyuria/polydipsia/polyphagia   Neurologic: No  Focal weakness, +dizziness, No  slurred speech/focal weakness/facial droop  Psychiatric: No  concerns with depression, +concerns with anxiety, No sleep problems, No mood problems  Exam:  BP (!) 150/100 (Patient Position: Sitting, Cuff Size: Large)   Pulse 81   Temp 98.3 F (36.8 C)   Ht 6\' 1"  (1.854 m)   Wt (!) 319 lb 4.8 oz (144.8 kg)   SpO2 98%   BMI 42.13 kg/m   Older Vitals  05/26/17 12:33 PM  05/26/17 12:34 PM  05/26/17 12:35 PM    BP  152/93  126/88  138/86   BP Location  LeftArm  LeftArm  LeftArm   Patient Position  Supine  Sitting  Standing   Pulse  80  75  76       Constitutional: VS see above. General Appearance: alert, well-developed, well-nourished, NAD  Eyes: Normal lids and conjunctive, non-icteric sclera  Ears, Nose, Mouth, Throat: MMM, Normal external inspection ears/nares/mouth/lips/gums. TM normal bilaterally.  Neck: No masses, trachea midline. No thyroid enlargement. No  tenderness/mass appreciated. No lymphadenopathy  Respiratory: Normal respiratory effort. no wheeze, no rhonchi, no rales  Cardiovascular: S1/S2 normal, no murmur, no rub/gallop auscultated. RRR. No lower extremity edema.   Gastrointestinal: Nontender, no masses, habitus limits exam.   Musculoskeletal: Gait normal. No clubbing/cyanosis of digits. Strength 5/5 alle xtremities  Neurological: Normal balance/coordination. No tremor. No cranial nerve deficit on limited exam. Motor and sensation intact and symmetric. Cerebellar reflexes intact. EOMI w/o nystagmus or dizziness. PERRLA. Dix-Hallpike negative bilaterally.   Skin: warm, dry, intact except few minor abrasions no apparent infection   Psychiatric: Normal judgment/insight. Normal mood and affect. Oriented x3.     EKG interpretation: Rate: 66  Rhythm: sinus No ST/T changes concerning for acute ischemia/infarct      ASSESSMENT/PLAN:   Robotic cuff measuring lower on intake but manual BP elevated. (+)orthostatics. Neuro exam otherwise ok.  Neg dix-hallpike.   Consider acute illness or viral prodrome, evolving hypertension, orthostatic hypotension uncomplicated, or MS complication vs other. Labs today for electrolyte abnormalities, anemia, thyroid disorder, etc.   ER precautions reviewed.   Lightheaded - Plan: CBC with Differential/Platelet, COMPLETE METABOLIC PANEL WITH GFR, TSH, Hemoglobin A1c, Urinalysis, Routine w reflex microscopic, EKG 12-Lead  MS (multiple sclerosis) ([place]) - Plan: CBC with Differential/Platelet, COMPLETE METABOLIC PANEL WITH GFR, TSH, Hemoglobin A1c, Urinalysis, Routine w reflex microscopic  Elevated blood pressure reading - Plan: CBC with Differential/Platelet, COMPLETE METABOLIC PANEL WITH GFR, TSH, Hemoglobin A1c, Urinalysis, Routine w reflex microscopic, EKG 12-Lead    Patient Instructions  Plan:  Will get labs today for further evaluation. At this point, I cannot say if your symptoms are due to  elevated blood pressure, dehydration, anemia, sugar problems, thyroid issues, acute viral illness, transient inner ear problem, or other cause such as complication of MS. We will know more after blood work is back. I am reassured that EKG looks fine. I am reluctant to start blood pressure medicines which may make the dizziness worse - I'd like to wait for the blood work to come back first. I do not think this is an inner ear issue. You are not showing symptoms of a stroke.   I'd like you to follow-up with Dr. Madilyn Fireman in the next few days to recheck your symptoms, or seek evaluation sooner if needed (this office, urgent care or ER). In the meantime - rest, maintain hydration, and will await results. She can review lab results with you and talk about whether getting an MRI or other testing might be needed.   If your symptoms worsen, particularly if chest pain, trouble breathing, weakness or speech changes, severe headache or vision changes, or other concerning symptoms - please go to the nearest ER for evaluation.     (Info also printed on Orthostatic Hypotension)       Visit summary with medication list and pertinent instructions was printed for patient to review. All questions at time of visit were answered - patient instructed to contact office with any additional concerns. ER/RTC precautions were reviewed with the patient. Follow-up plan: Return for recheck with PCP tomorrow or day after .  Note: Total time spent 40 minutes, greater than 50% of the visit was spent face-to-face counseling and coordinating care for the following: The primary encounter diagnosis was Lightheaded. Diagnoses of MS (multiple sclerosis) ([place]) and Elevated blood pressure reading were also pertinent to this visit.Marland Kitchen  Please note: voice recognition software was used to produce this document, and typos may escape review. Please contact me for any needed clarifications.

## 2017-05-26 NOTE — Patient Instructions (Addendum)
Plan:  Will get labs today for further evaluation. At this point, I cannot say if your symptoms are due to elevated blood pressure, dehydration, anemia, sugar problems, thyroid issues, acute viral illness, transient inner ear problem, or other cause such as complication of MS. We will know more after blood work is back. I am reassured that EKG looks fine. I am reluctant to start blood pressure medicines which may make the dizziness worse - I'd like to wait for the blood work to come back first. I do not think this is an inner ear issue. You are not showing symptoms of a stroke.   I'd like you to follow-up with Dr. Madilyn Fireman in the next few days to recheck your symptoms, or seek evaluation sooner if needed (this office, urgent care or ER). In the meantime - rest, maintain hydration, and will await results. She can review lab results with you and talk about whether getting an MRI or other testing might be needed.   If your symptoms worsen, particularly if chest pain, trouble breathing, weakness or speech changes, severe headache or vision changes, or other concerning symptoms - please go to the nearest ER for evaluation.      Orthostatic Hypotension Orthostatic hypotension is a sudden drop in blood pressure that happens when you quickly change positions, such as when you get up from a seated or lying position. Blood pressure is a measurement of how strongly, or weakly, your blood is pressing against the walls of your arteries. Arteries are blood vessels that carry blood from your heart throughout your body. When blood pressure is too low, you may not get enough blood to your brain or to the rest of your organs. This can cause weakness, light-headedness, rapid heartbeat, and fainting. This can last for just a few seconds or for up to a few minutes. Orthostatic hypotension is usually not a serious problem. However, if it happens frequently or gets worse, it may be a sign of something more serious. What are  the causes? This condition may be caused by:  Sudden changes in posture, such as standing up quickly after you have been sitting or lying down.  Blood loss.  Loss of body fluids (dehydration).  Heart problems.  Hormone (endocrine) problems.  Pregnancy.  Severe infection.  Lack of certain nutrients.  Severe allergic reactions (anaphylaxis).  Certain medicines, such as blood pressure medicine or medicines that make the body lose excess fluids (diuretics). Sometimes, this condition can be caused by not taking medicine as directed, such as taking too much of a certain medicine.  What increases the risk? Certain factors can make you more likely to develop orthostatic hypotension, including:  Age. Risk increases as you get older.  Conditions that affect the heart or the central nervous system.  Taking certain medicines, such as blood pressure medicine or diuretics.  Being pregnant.  What are the signs or symptoms? Symptoms of this condition may include:  Weakness.  Light-headedness.  Dizziness.  Blurred vision.  Fatigue.  Rapid heartbeat.  Fainting, in severe cases.  How is this diagnosed? This condition is diagnosed based on:  Your medical history.  Your symptoms.  Your blood pressure measurement. Your health care provider will check your blood pressure when you are: ? Lying down. ? Sitting. ? Standing.  A blood pressure reading is recorded as two numbers, such as "120 over 80" (or 120/80). The first ("top") number is called the systolic pressure. It is a measure of the pressure in your arteries  as your heart beats. The second ("bottom") number is called the diastolic pressure. It is a measure of the pressure in your arteries when your heart relaxes between beats. Blood pressure is measured in a unit called mm Hg. Healthy blood pressure for adults is 120/80. If your blood pressure is below 90/60, you may be diagnosed with hypotension. Other information or  tests that may be used to diagnose orthostatic hypotension include:  Your other vital signs, such as your heart rate and temperature.  Blood tests.  Tilt table test. For this test, you will be safely secured to a table that moves you from a lying position to an upright position. Your heart rhythm and blood pressure will be monitored during the test.  How is this treated? Treatment for this condition may include:  Changing your diet. This may involve eating more salt (sodium) or drinking more water.  Taking medicines to raise your blood pressure.  Changing the dosage of certain medicines you are taking that might be lowering your blood pressure.  Wearing compression stockings. These stockings help to prevent blood clots and reduce swelling in your legs.  In some cases, you may need to go to the hospital for:  Fluid replacement. This means you will receive fluids through an IV tube.  Blood replacement. This means you will receive donated blood through an IV tube (transfusion).  Treating an infection or heart problems, if this applies.  Monitoring. You may need to be monitored while medicines that you are taking wear off.  Follow these instructions at home: Eating and drinking   Drink enough fluid to keep your urine clear or pale yellow.  Eat a healthy diet and follow instructions from your health care provider about eating or drinking restrictions. A healthy diet includes: ? Fresh fruits and vegetables. ? Whole grains. ? Lean meats. ? Low-fat dairy products.  Eat extra salt only as directed. Do not add extra salt to your diet unless your health care provider told you to do that.  Eat frequent, small meals.  Avoid standing up suddenly after eating. Medicines  Take over-the-counter and prescription medicines only as told by your health care provider. ? Follow instructions from your health care provider about changing the dosage of your current medicines, if this  applies. ? Do not stop or adjust any of your medicines on your own. General instructions  Wear compression stockings as told by your health care provider.  Get up slowly from lying down or sitting positions. This gives your blood pressure a chance to adjust.  Avoid hot showers and excessive heat as directed by your health care provider.  Return to your normal activities as told by your health care provider. Ask your health care provider what activities are safe for you.  Do not use any products that contain nicotine or tobacco, such as cigarettes and e-cigarettes. If you need help quitting, ask your health care provider.  Keep all follow-up visits as told by your health care provider. This is important. Contact a health care provider if:  You vomit.  You have diarrhea.  You have a fever for more than 2-3 days.  You feel more thirsty than usual.  You feel weak and tired. Get help right away if:  You have chest pain.  You have a fast or irregular heartbeat.  You develop numbness in any part of your body.  You cannot move your arms or your legs.  You have trouble speaking.  You become sweaty or feel  lightheaded.  You faint.  You feel short of breath.  You have trouble staying awake.  You feel confused. This information is not intended to replace advice given to you by your health care provider. Make sure you discuss any questions you have with your health care provider. Document Released: 06/28/2002 Document Revised: 03/26/2016 Document Reviewed: 12/29/2015 Elsevier Interactive Patient Education  2018 [employer].

## 2017-05-27 ENCOUNTER — Encounter: Payer: Self-pay | Admitting: Family Medicine

## 2017-05-27 ENCOUNTER — Ambulatory Visit (INDEPENDENT_AMBULATORY_CARE_PROVIDER_SITE_OTHER): Payer: Medicare Other | Admitting: Family Medicine

## 2017-05-27 VITALS — BP 120/80 | HR 77 | Ht 73.0 in | Wt 321.0 lb

## 2017-05-27 DIAGNOSIS — R030 Elevated blood-pressure reading, without diagnosis of hypertension: Secondary | ICD-10-CM | POA: Diagnosis not present

## 2017-05-27 LAB — URINALYSIS, ROUTINE W REFLEX MICROSCOPIC
Bilirubin Urine: NEGATIVE
Glucose, UA: NEGATIVE
Hgb urine dipstick: NEGATIVE
KETONES UR: NEGATIVE
Leukocytes, UA: NEGATIVE
NITRITE: NEGATIVE
Protein, ur: NEGATIVE
SPECIFIC GRAVITY, URINE: 1.023 (ref 1.001–1.03)
pH: <=5 (ref 5.0–8.0)

## 2017-05-27 LAB — CBC WITH DIFFERENTIAL/PLATELET
BASOS PCT: 0.7 %
Basophils Absolute: 50 cells/uL (ref 0–200)
Eosinophils Absolute: 158 cells/uL (ref 15–500)
Eosinophils Relative: 2.2 %
HCT: 39.5 % (ref 38.5–50.0)
Hemoglobin: 13.6 g/dL (ref 13.2–17.1)
Lymphs Abs: 1627 cells/uL (ref 850–3900)
MCH: 29.5 pg (ref 27.0–33.0)
MCHC: 34.4 g/dL (ref 32.0–36.0)
MCV: 85.7 fL (ref 80.0–100.0)
MPV: 9.9 fL (ref 7.5–12.5)
Monocytes Relative: 6 %
Neutro Abs: 4932 cells/uL (ref 1500–7800)
Neutrophils Relative %: 68.5 %
PLATELETS: 285 10*3/uL (ref 140–400)
RBC: 4.61 10*6/uL (ref 4.20–5.80)
RDW: 13.3 % (ref 11.0–15.0)
TOTAL LYMPHOCYTE: 22.6 %
WBC: 7.2 10*3/uL (ref 3.8–10.8)
WBCMIX: 432 {cells}/uL (ref 200–950)

## 2017-05-27 LAB — COMPLETE METABOLIC PANEL WITH GFR
AG RATIO: 1.9 (calc) (ref 1.0–2.5)
ALT: 17 U/L (ref 9–46)
AST: 14 U/L (ref 10–35)
Albumin: 4.4 g/dL (ref 3.6–5.1)
Alkaline phosphatase (APISO): 79 U/L (ref 40–115)
BUN: 18 mg/dL (ref 7–25)
CALCIUM: 9.9 mg/dL (ref 8.6–10.3)
CO2: 29 mmol/L (ref 20–32)
CREATININE: 0.84 mg/dL (ref 0.70–1.33)
Chloride: 102 mmol/L (ref 98–110)
GFR, EST AFRICAN AMERICAN: 114 mL/min/{1.73_m2} (ref >=60)
GFR, EST NON AFRICAN AMERICAN: 99 mL/min/{1.73_m2} (ref >=60)
Globulin: 2.3 g/dL (calc) (ref 1.9–3.7)
Glucose, Bld: 97 mg/dL (ref 65–99)
Potassium: 4.2 mmol/L (ref 3.5–5.3)
Sodium: 138 mmol/L (ref 135–146)
TOTAL PROTEIN: 6.7 g/dL (ref 6.1–8.1)
Total Bilirubin: 0.4 mg/dL (ref 0.2–1.2)

## 2017-05-27 LAB — HEMOGLOBIN A1C
HEMOGLOBIN A1C: 5.1 %{Hb} (ref <5.7)
Mean Plasma Glucose: 100 (calc)
eAG (mmol/L): 5.5 (calc)

## 2017-05-27 LAB — TSH: TSH: 1.07 mIU/L (ref 0.40–4.50)

## 2017-05-27 NOTE — Patient Instructions (Addendum)
Try to check her blood pressure at home if you are able to may be a couple times a week for the next couple of weeks to see if it is getting back under control. Work on low-salt diet.   DASH Eating Plan DASH stands for "Dietary Approaches to Stop Hypertension." The DASH eating plan is a healthy eating plan that has been shown to reduce high blood pressure (hypertension). It may also reduce your risk for type 2 diabetes, heart disease, and stroke. The DASH eating plan may also help with weight loss. What are tips for following this plan? General guidelines  Avoid eating more than 2,300 mg (milligrams) of salt (sodium) a day. If you have hypertension, you may need to reduce your sodium intake to 1,500 mg a day.  Limit alcohol intake to no more than 1 drink a day for nonpregnant women and 2 drinks a day for men. One drink equals 12 oz of beer, 5 oz of wine, or 1 oz of hard liquor.  Work with your health care provider to maintain a healthy body weight or to lose weight. Ask what an ideal weight is for you.  Get at least 30 minutes of exercise that causes your heart to beat faster (aerobic exercise) most days of the week. Activities may include walking, swimming, or biking.  Work with your health care provider or diet and nutrition specialist (dietitian) to adjust your eating plan to your individual calorie needs. Reading food labels  Check food labels for the amount of sodium per serving. Choose foods with less than 5 percent of the Daily Value of sodium. Generally, foods with less than 300 mg of sodium per serving fit into this eating plan.  To find whole grains, look for the word "whole" as the first word in the ingredient list. Shopping  Buy products labeled as "low-sodium" or "no salt added."  Buy fresh foods. Avoid canned foods and premade or frozen meals. Cooking  Avoid adding salt when cooking. Use salt-free seasonings or herbs instead of table salt or sea salt. Check with your  health care provider or pharmacist before using salt substitutes.  Do not fry foods. Cook foods using healthy methods such as baking, boiling, grilling, and broiling instead.  Cook with heart-healthy oils, such as olive, canola, soybean, or sunflower oil. Meal planning   Eat a balanced diet that includes: ? 5 or more servings of fruits and vegetables each day. At each meal, try to fill half of your plate with fruits and vegetables. ? Up to 6-8 servings of whole grains each day. ? Less than 6 oz of lean meat, poultry, or fish each day. A 3-oz serving of meat is about the same size as a deck of cards. One egg equals 1 oz. ? 2 servings of low-fat dairy each day. ? A serving of nuts, seeds, or beans 5 times each week. ? Heart-healthy fats. Healthy fats called Omega-3 fatty acids are found in foods such as flaxseeds and coldwater fish, like sardines, salmon, and mackerel.  Limit how much you eat of the following: ? Canned or prepackaged foods. ? Food that is high in trans fat, such as fried foods. ? Food that is high in saturated fat, such as fatty meat. ? Sweets, desserts, sugary drinks, and other foods with added sugar. ? Full-fat dairy products.  Do not salt foods before eating.  Try to eat at least 2 vegetarian meals each week.  Eat more home-cooked food and less restaurant, buffet, and  fast food.  When eating at a restaurant, ask that your food be prepared with less salt or no salt, if possible. What foods are recommended? The items listed may not be a complete list. Talk with your dietitian about what dietary choices are best for you. Grains Whole-grain or whole-wheat bread. Whole-grain or whole-wheat pasta. Brown rice. [place]. Bulgur. Whole-grain and low-sodium cereals. Pita bread. Low-fat, low-sodium crackers. Whole-wheat flour tortillas. Vegetables Fresh or frozen vegetables (raw, steamed, roasted, or grilled). Low-sodium or reduced-sodium tomato and vegetable juice.  Low-sodium or reduced-sodium tomato sauce and tomato paste. Low-sodium or reduced-sodium canned vegetables. Fruits All fresh, dried, or frozen fruit. Canned fruit in natural juice (without added sugar). Meat and other protein foods Skinless chicken or [place]. Ground chicken or [place]. Pork with fat trimmed off. Fish and seafood. Egg whites. Dried beans, peas, or lentils. Unsalted nuts, nut butters, and seeds. Unsalted canned beans. Lean cuts of beef with fat trimmed off. Low-sodium, lean deli meat. Dairy Low-fat (1%) or fat-free (skim) milk. Fat-free, low-fat, or reduced-fat cheeses. Nonfat, low-sodium ricotta or cottage cheese. Low-fat or nonfat yogurt. Low-fat, low-sodium cheese. Fats and oils Soft margarine without trans fats. Vegetable oil. Low-fat, reduced-fat, or light mayonnaise and salad dressings (reduced-sodium). Canola, safflower, olive, soybean, and sunflower oils. Avocado. Seasoning and other foods Herbs. Spices. Seasoning mixes without salt. Unsalted popcorn and pretzels. Fat-free sweets. What foods are not recommended? The items listed may not be a complete list. Talk with your dietitian about what dietary choices are best for you. Grains Baked goods made with fat, such as croissants, muffins, or some breads. Dry pasta or rice meal packs. Vegetables Creamed or fried vegetables. Vegetables in a cheese sauce. Regular canned vegetables (not low-sodium or reduced-sodium). Regular canned tomato sauce and paste (not low-sodium or reduced-sodium). Regular tomato and vegetable juice (not low-sodium or reduced-sodium). Angie Fava. Olives. Fruits Canned fruit in a light or heavy syrup. Fried fruit. Fruit in cream or butter sauce. Meat and other protein foods Fatty cuts of meat. Ribs. Fried meat. Berniece Salines. Sausage. Bologna and other processed lunch meats. Salami. Fatback. Hotdogs. Bratwurst. Salted nuts and seeds. Canned beans with added salt. Canned or smoked fish. Whole eggs or egg yolks. Chicken  or [place] with skin. Dairy Whole or 2% milk, cream, and half-and-half. Whole or full-fat cream cheese. Whole-fat or sweetened yogurt. Full-fat cheese. Nondairy creamers. Whipped toppings. Processed cheese and cheese spreads. Fats and oils Butter. Stick margarine. Lard. Shortening. Ghee. Bacon fat. Tropical oils, such as coconut, palm kernel, or palm oil. Seasoning and other foods Salted popcorn and pretzels. Onion salt, garlic salt, seasoned salt, table salt, and sea salt. Worcestershire sauce. Tartar sauce. Barbecue sauce. Teriyaki sauce. Soy sauce, including reduced-sodium. Steak sauce. Canned and packaged gravies. Fish sauce. Oyster sauce. Cocktail sauce. Horseradish that you find on the shelf. Ketchup. Mustard. Meat flavorings and tenderizers. Bouillon cubes. Hot sauce and Tabasco sauce. Premade or packaged marinades. Premade or packaged taco seasonings. Relishes. Regular salad dressings. Where to find more information:  National Heart, Lung, and [place]: https://wilson-eaton.com/  American Heart Association: www.heart.org Summary  The DASH eating plan is a healthy eating plan that has been shown to reduce high blood pressure (hypertension). It may also reduce your risk for type 2 diabetes, heart disease, and stroke.  With the DASH eating plan, you should limit salt (sodium) intake to 2,300 mg a day. If you have hypertension, you may need to reduce your sodium intake to 1,500 mg a day.  When on the  DASH eating plan, aim to eat more fresh fruits and vegetables, whole grains, lean proteins, low-fat dairy, and heart-healthy fats.  Work with your health care provider or diet and nutrition specialist (dietitian) to adjust your eating plan to your individual calorie needs. This information is not intended to replace advice given to you by your health care provider. Make sure you discuss any questions you have with your health care provider. Document Released: 06/27/2011 Document Revised:  07/01/2016 Document Reviewed: 07/01/2016 Elsevier Interactive Patient Education  2017 [employer].

## 2017-05-27 NOTE — Progress Notes (Addendum)
Subjective:    Patient ID: Mark Spitz., male    DOB: 01/23/1961, 56 y.o.   MRN: [id number]  HPI 56 year old male comes in to follow-up on recent episode of uncontrolled hypertension.  He was actually seen yesterday by 1 of my partners.  He is currently on active treatment for MS and is starting 2 days ago he just felt lightheaded and nauseated.  He checked his blood pressure and it was in the 180s over 100s.  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  No new medications.  He did not stop anything.  No new dietary changes etc.he was not taking any NSAIDs or decongestants around that time.    He had extensive lab work done yesterday which was overall essentially negative.  We reviewed those results here together today.  He is feeling somewhat better, he just wants to know what may have caused this.   Review of Systems   No chest pain, shortness of breath, or diaphoresis.  BP 120/80   Pulse 77   Ht 6\' 1"  (1.854 m)   Wt (!) 321 lb (145.6 kg)   SpO2 95%   BMI 42.35 kg/m     No Known Allergies  Past Medical History:  Diagnosis Date  . Anxiety    chronic pain treated with narcotics  . Arthritis   . Chronic pain   . Constipation due to opioid therapy   . Depression   . Dysphonia    thoracic left sided"impingement" with heminumbness  . GI bleed    abt 15 years ago  . Morbid obesity ([place])   . MS (multiple sclerosis) ([place]) 02/11/2013   [name]  . MS (multiple sclerosis) ([place])   . Neuromuscular disorder ([place])   . Pneumonia   . Sleep apnea with use of continuous positive airway pressure (CPAP) 02/11/2013   07-13-12 AHi of 98.6 /hr titrated to 11 cm water ,  average user time 4 hours and 4 minutes. Residual AHI 1.20 December 2012 .  Marland Kitchen Umbilical hernia     Past Surgical History:  Procedure Laterality Date  . CARPAL TUNNEL RELEASE Left    [name]  . COLONOSCOPY W/ POLYPECTOMY    . POSTERIOR LUMBAR FUSION   04/17/15   [name], also removed old hardware  . SPINE SURGERY     total of six vertebras fused, three lumbar surgeries and one anterior neck fusion  . TONSILLECTOMY      Social History   Socioeconomic History  . Marital status: Married    Spouse name: Mark [name]  . Number of children: 2  . Years of education: 12+  . Highest education level: Not on file  Social Needs  . Financial resource strain: Not on file  . Food insecurity - worry: Not on file  . Food insecurity - inability: Not on file  . Transportation needs - medical: Not on file  . Transportation needs - non-medical: Not on file  Occupational History  . Not on file  Tobacco Use  . Smoking status: Never Smoker  . Smokeless tobacco: Never Used  Substance and Sexual Activity  . Alcohol use: Yes    Comment: occasionally  . Drug use: No  . Sexual activity: Not on file  Other Topics Concern  . Not on file  Social History Narrative   Last download from 2011 showed good response to 12 cm water with 3  cm EPR, but we have no record of the baseline AI.    Need to order  the old paper copies and send patient for retitration  to lab, after five years plus this will be a SPLIT at 15 and at 3% score. This calendar year preferred for deductible costs.    Patient is married Mark [name]) and lives at home with his wife, daughter, son-in-law and grandchild.   Patient has two adult children.   Patient is right-handed.   Patient is disabled.   Patient has a college education.   Patient drinks a gallon of tea daily.       Family History  Problem Relation Age of Onset  . Cancer Other   . Cancer Other   . Sleep walking Son     Outpatient Encounter Medications as of 05/27/2017  Medication Sig  . amphetamine-dextroamphetamine (ADDERALL XR) 30 MG 24 hr capsule Take 30 mg by mouth.  . diazepam (VALIUM) 5 MG tablet TAKE 1-4 TABLETS BY MOUTH AS NEEDED FOR MRI  . DULoxetine (CYMBALTA) 60 MG capsule Take 60 mg by mouth 2 (two) times daily.   Marland Kitchen  gabapentin (NEURONTIN) 800 MG tablet Take 800 mg by mouth 4 (four) times daily.  Marland Kitchen HYDROcodone-acetaminophen (NORCO) 10-325 MG per tablet Take 2 tablets by mouth every 4 (four) hours as needed for severe pain.   Marland Kitchen ibuprofen (ADVIL,MOTRIN) 800 MG tablet Take 800 mg by mouth every 8 (eight) hours as needed for moderate pain.  . meclizine (ANTIVERT) 25 MG tablet Take 1 tablet (25 mg total) 3 (three) times daily as needed by mouth for dizziness.  . tamsulosin (FLOMAX) 0.4 MG CAPS capsule Take 0.4 mg by mouth at bedtime.   . Teriflunomide (AUBAGIO) 14 MG TABS Take 14 mg by mouth at bedtime. Dr Rae Mar  . tiZANidine (ZANAFLEX) 4 MG tablet Take 4 mg by mouth 4 (four) times daily.   . Turmeric Curcumin 500 MG CAPS Take 500 mg by mouth.   No facility-administered encounter medications on file as of 05/27/2017.          Objective:   Physical Exam  Constitutional: He is oriented to person, place, and time. He appears well-developed and well-nourished.  HENT:  Head: Normocephalic and atraumatic.  Cardiovascular: Normal rate, regular rhythm and normal heart sounds.  Pulmonary/Chest: Effort normal and breath sounds normal.  Neurological: He is alert and oriented to person, place, and time.  Skin: Skin is warm and dry.  Psychiatric: He has a normal mood and affect. His behavior is normal.       Assessment & Plan:  Elevated blood pressure without diagnosis of HTN -blood pressure is still a little bit higher than his baseline but at least is back within the normal range.  It seems like it may be trending down.  I am still not very clear what caused his blood pressure to be significantly uncontrolled for about 2 days.  He denies any chest pain.  Pressure at goal today.  Reviewed lab work today.  I did review the lab work with him to show that there was no sign of acute renal failure liver failure thyroid disorder etc.  I did give him some additional information about the Dash diet and eating a  low-salt diet overall.  his lightheadedness and dizziness seems to have resolved.

## 2017-06-19 DIAGNOSIS — M5124 Other intervertebral disc displacement, thoracic region: Secondary | ICD-10-CM | POA: Diagnosis not present

## 2017-06-24 ENCOUNTER — Ambulatory Visit (INDEPENDENT_AMBULATORY_CARE_PROVIDER_SITE_OTHER): Payer: Medicare Other | Admitting: Family Medicine

## 2017-06-24 VITALS — BP 124/67 | HR 81

## 2017-06-24 DIAGNOSIS — R030 Elevated blood-pressure reading, without diagnosis of hypertension: Secondary | ICD-10-CM

## 2017-06-24 NOTE — Progress Notes (Signed)
Pt came into clinic today for BP check. Pt denies any chest pain, palpitations, dizziness, blurred vision. Pt's BP was elevated on first check, [place]. Let Pt sit for a couple minutes and rechecked pressure, it was within range. Will route to PCP for review. Pt advised we would contact him with any recommendations.

## 2017-06-24 NOTE — Progress Notes (Signed)
HTN - Well controlled. Continue current regimen. Follow up in  3 months.     Beatrice Lecher, MD

## 2017-06-25 NOTE — Progress Notes (Signed)
Pt advised,verbalized understanding. 

## 2017-06-26 DIAGNOSIS — N529 Male erectile dysfunction, unspecified: Secondary | ICD-10-CM | POA: Diagnosis not present

## 2017-06-26 DIAGNOSIS — N138 Other obstructive and reflux uropathy: Secondary | ICD-10-CM | POA: Diagnosis not present

## 2017-06-26 DIAGNOSIS — E291 Testicular hypofunction: Secondary | ICD-10-CM | POA: Diagnosis not present

## 2017-06-26 DIAGNOSIS — N401 Enlarged prostate with lower urinary tract symptoms: Secondary | ICD-10-CM | POA: Diagnosis not present

## 2017-07-24 DIAGNOSIS — H527 Unspecified disorder of refraction: Secondary | ICD-10-CM | POA: Diagnosis not present

## 2017-08-08 DIAGNOSIS — M25562 Pain in left knee: Secondary | ICD-10-CM | POA: Diagnosis not present

## 2017-08-08 DIAGNOSIS — M25561 Pain in right knee: Secondary | ICD-10-CM | POA: Diagnosis not present

## 2017-08-08 DIAGNOSIS — M170 Bilateral primary osteoarthritis of knee: Secondary | ICD-10-CM | POA: Diagnosis not present

## 2017-08-29 ENCOUNTER — Telehealth: Payer: Self-pay | Admitting: Family Medicine

## 2017-08-29 NOTE — Telephone Encounter (Signed)
Please call patient and let him know that I did fill out his clearance form.  We can fax it for him if needed.  Though I did not see a fax number attached.  In addition as far as his blood pressure is concerned I would love for him to start checking it at home.  When he comes in here it is often high but in wound we recheck it before he leaves it looks great.  In fact the last 2 times that he was here once in November and once in November when on recheck the blood pressures were absolutely beautiful.  So may be if he would start to check it at home and see what kind of numbers he is been getting there in a more typical relaxed environment.  He can call us back in a couple weeks and let us know.  He can also see if there is a difference in his blood pressure on days where he takes his Adderall versus when he does not.  Adderall often can raise blood pressure

## 2017-09-01 NOTE — Telephone Encounter (Signed)
Left VM for Pt to return clinic call.  

## 2017-09-01 NOTE — Telephone Encounter (Signed)
Pt advised, he will get a home BP cuff and start checking it at home. He will come by the office today and pick up the paper. No further questions.

## 2017-09-10 DIAGNOSIS — L853 Xerosis cutis: Secondary | ICD-10-CM | POA: Diagnosis not present

## 2017-09-10 DIAGNOSIS — M722 Plantar fascial fibromatosis: Secondary | ICD-10-CM | POA: Diagnosis not present

## 2017-09-10 DIAGNOSIS — M7731 Calcaneal spur, right foot: Secondary | ICD-10-CM | POA: Diagnosis not present

## 2017-09-10 DIAGNOSIS — M7732 Calcaneal spur, left foot: Secondary | ICD-10-CM | POA: Diagnosis not present

## 2017-10-15 DIAGNOSIS — G35 Multiple sclerosis: Secondary | ICD-10-CM | POA: Diagnosis not present

## 2017-10-15 DIAGNOSIS — Z5181 Encounter for therapeutic drug level monitoring: Secondary | ICD-10-CM | POA: Diagnosis not present

## 2017-10-15 DIAGNOSIS — G629 Polyneuropathy, unspecified: Secondary | ICD-10-CM | POA: Diagnosis not present

## 2017-10-15 DIAGNOSIS — G8381 Brown-Sequard syndrome: Secondary | ICD-10-CM | POA: Diagnosis not present

## 2017-10-28 DIAGNOSIS — M5440 Lumbago with sciatica, unspecified side: Secondary | ICD-10-CM | POA: Diagnosis not present

## 2017-11-05 DIAGNOSIS — H903 Sensorineural hearing loss, bilateral: Secondary | ICD-10-CM | POA: Diagnosis not present

## 2017-11-26 DIAGNOSIS — M25561 Pain in right knee: Secondary | ICD-10-CM | POA: Diagnosis not present

## 2017-11-26 DIAGNOSIS — M25562 Pain in left knee: Secondary | ICD-10-CM | POA: Diagnosis not present

## 2017-12-11 DIAGNOSIS — G4733 Obstructive sleep apnea (adult) (pediatric): Secondary | ICD-10-CM | POA: Diagnosis not present

## 2017-12-18 DIAGNOSIS — M4802 Spinal stenosis, cervical region: Secondary | ICD-10-CM | POA: Diagnosis not present

## 2018-01-01 ENCOUNTER — Encounter (INDEPENDENT_AMBULATORY_CARE_PROVIDER_SITE_OTHER): Payer: Medicare Other

## 2018-01-01 DIAGNOSIS — J180 Bronchopneumonia, unspecified organism: Secondary | ICD-10-CM | POA: Diagnosis not present

## 2018-01-01 DIAGNOSIS — S0501XA Injury of conjunctiva and corneal abrasion without foreign body, right eye, initial encounter: Secondary | ICD-10-CM | POA: Diagnosis not present

## 2018-01-01 DIAGNOSIS — R062 Wheezing: Secondary | ICD-10-CM | POA: Diagnosis not present

## 2018-01-13 ENCOUNTER — Ambulatory Visit (INDEPENDENT_AMBULATORY_CARE_PROVIDER_SITE_OTHER): Payer: Medicare Other | Admitting: Family Medicine

## 2018-01-15 ENCOUNTER — Encounter: Payer: Self-pay | Admitting: Family Medicine

## 2018-01-15 ENCOUNTER — Ambulatory Visit: Payer: Medicare Other | Admitting: Family Medicine

## 2018-01-15 VITALS — BP 147/84 | HR 78 | Ht 73.0 in | Wt 326.0 lb

## 2018-01-15 DIAGNOSIS — R635 Abnormal weight gain: Secondary | ICD-10-CM | POA: Diagnosis not present

## 2018-01-15 DIAGNOSIS — Z6841 Body Mass Index (BMI) 40.0 and over, adult: Secondary | ICD-10-CM

## 2018-01-15 DIAGNOSIS — J181 Lobar pneumonia, unspecified organism: Secondary | ICD-10-CM | POA: Diagnosis not present

## 2018-01-15 DIAGNOSIS — J189 Pneumonia, unspecified organism: Secondary | ICD-10-CM

## 2018-01-15 MED ORDER — PREDNISONE 20 MG PO TABS: 40.0000 mg | ORAL_TABLET | Freq: Every day | ORAL | 0 refills | Status: DC

## 2018-01-15 NOTE — Progress Notes (Signed)
Subjective:    Patient ID: Mark [name]., male    DOB: 1961/01/08, 57 y.o.   MRN: [id number]  HPI 57 year old male with a history of MS is here today to follow-up for recent urgent care visit and he was actually seen in [facility] on January 01, 2018.  He originally went because he got something in his eye.  He think about actually stung his eye while he was there his wife mentioned that he had been wheezing at night and feeling a little short of breath.  They did a chest x-ray which per report indicated a right upper lobe pneumonia.  He was given doxycycline twice a day as well as a Ventolin inhaler.  He actually is feeling better.  The wheezing has gotten much better but he still has a wet cough.  Is still a little short of breath.  He also wants to discuss weight loss.  He has MS and so has problems with energy and strength and says he does not exercise regularly.  He is also had multiple back surgeries and is actually due for a knee replacement but per his orthopedist he has to lose down to 290 pounds to have the surgery.  He really has very few foods that he actually likes and sticks to those.  He is really not very interested in nutrition counseling.  He does drink anywhere from 1/2 to 1 gallon of sweet tea a day for [employer].   Review of Systems  BP (!) 147/84   Pulse 78   Ht 6\' 1"  (1.854 m)   Wt (!) 326 lb (147.9 kg)   SpO2 95%   BMI 43.01 kg/m     No Known Allergies  Past Medical History:  Diagnosis Date  . Anxiety    chronic pain treated with narcotics  . Arthritis   . Chronic pain   . Constipation due to opioid therapy   . Depression   . Dysphonia    thoracic left sided"impingement" with heminumbness  . GI bleed    abt 15 years ago  . Morbid obesity ([place])   . MS (multiple sclerosis) ([place]) 02/11/2013   [name]  . MS (multiple sclerosis) ([place])   . Neuromuscular disorder ([place])   . Pneumonia   . Sleep apnea with use of continuous positive airway  pressure (CPAP) 02/11/2013   07-13-12 AHi of 98.6 /hr titrated to 11 cm water ,  average user time 4 hours and 4 minutes. Residual AHI 1.20 December 2012 .  Marland Kitchen Umbilical hernia     Past Surgical History:  Procedure Laterality Date  . CARPAL TUNNEL RELEASE Left    [name]  . COLONOSCOPY W/ POLYPECTOMY    . POSTERIOR LUMBAR FUSION  04/17/15   [name], also removed old hardware  . SPINE SURGERY     total of six vertebras fused, three lumbar surgeries and one anterior neck fusion  . TONSILLECTOMY      Social History   Socioeconomic History  . Marital status: Married    Spouse name: [name]  . Number of children: 2  . Years of education: 12+  . Highest education level: Not on file  Occupational History  . Not on file  Social Needs  . Financial resource strain: Not on file  . Food insecurity:    Worry: Not on file    Inability: Not on file  . Transportation needs:    Medical: Not on file  Non-medical: Not on file  Tobacco Use  . Smoking status: Never Smoker  . Smokeless tobacco: Never Used  Substance and Sexual Activity  . Alcohol use: Yes    Comment: occasionally  . Drug use: No  . Sexual activity: Not on file  Lifestyle  . Physical activity:    Days per week: Not on file    Minutes per session: Not on file  . Stress: Not on file  Relationships  . Social connections:    Talks on phone: Not on file    Gets together: Not on file    Attends religious service: Not on file    Active member of club or organization: Not on file    Attends meetings of clubs or organizations: Not on file    Relationship status: Not on file  . Intimate partner violence:    Fear of current or ex partner: Not on file    Emotionally abused: Not on file    Physically abused: Not on file    Forced sexual activity: Not on file  Other Topics Concern  . Not on file  Social History Narrative   Last download from 2011 showed good response to 12 cm water with 3 cm EPR, but we have no record of the  baseline AI.    Need to order  the old paper copies and send patient for retitration  to lab, after five years plus this will be a SPLIT at 16 and at 3% score. This calendar year preferred for deductible costs.    Patient is married [name]) and lives at home with his wife, daughter, son-in-law and grandchild.   Patient has two adult children.   Patient is right-handed.   Patient is disabled.   Patient has a college education.   Patient drinks a gallon of tea daily.       Family History  Problem Relation Age of Onset  . Cancer Other   . Cancer Other   . Sleep walking Son     Outpatient Encounter Medications as of 01/15/2018  Medication Sig  . amphetamine-dextroamphetamine (ADDERALL XR) 30 MG 24 hr capsule Take 30 mg by mouth.  . diazepam (VALIUM) 5 MG tablet TAKE 1-4 TABLETS BY MOUTH AS NEEDED FOR MRI  . DULoxetine (CYMBALTA) 60 MG capsule Take 60 mg by mouth 2 (two) times daily.   Marland Kitchen gabapentin (NEURONTIN) 800 MG tablet Take 800 mg by mouth 4 (four) times daily.  Marland Kitchen HYDROcodone-acetaminophen (NORCO) 10-325 MG per tablet Take 2 tablets by mouth every 4 (four) hours as needed for severe pain.   Marland Kitchen ibuprofen (ADVIL,MOTRIN) 800 MG tablet Take 800 mg by mouth every 8 (eight) hours as needed for moderate pain.  . tamsulosin (FLOMAX) 0.4 MG CAPS capsule Take 0.4 mg by mouth at bedtime.   . Teriflunomide (AUBAGIO) 14 MG TABS Take 14 mg by mouth at bedtime. Dr Rae Mar  . Turmeric Curcumin 500 MG CAPS Take 500 mg by mouth.  . VENTOLIN HFA 108 (90 Base) MCG/ACT inhaler   . predniSONE (DELTASONE) 20 MG tablet Take 2 tablets (40 mg total) by mouth daily with breakfast.  . [DISCONTINUED] meclizine (ANTIVERT) 25 MG tablet Take 1 tablet (25 mg total) 3 (three) times daily as needed by mouth for dizziness.  . [DISCONTINUED] tiZANidine (ZANAFLEX) 4 MG tablet Take 4 mg by mouth 4 (four) times daily.    No facility-administered encounter medications on file as of 01/15/2018.          Objective:  Physical Exam  Constitutional: He is oriented to person, place, and time. He appears well-developed and well-nourished.  HENT:  Head: Normocephalic and atraumatic.  Right Ear: External ear normal.  Left Ear: External ear normal.  Nose: Nose normal.  Mouth/Throat: Oropharynx is clear and moist.  TMs and canals are clear.   Eyes: Pupils are equal, round, and reactive to light. Conjunctivae and EOM are normal.  Neck: Neck supple. No thyromegaly present.  Cardiovascular: Normal rate and normal heart sounds.  Pulmonary/Chest: Effort normal and breath sounds normal.  Lymphadenopathy:    He has no cervical adenopathy.  Neurological: He is alert and oriented to person, place, and time.  Skin: Skin is warm and dry.  Psychiatric: He has a normal mood and affect.       Assessment & Plan:  Right upper lobe pneumonia-lungs are clear on exam today.  Plan to repeat chest x-ray in about 1-1/2 weeks.  Because he is still noticing some shortness of breath we will go ahead and give him 5 days of prednisone per his request.  Plan to repeat chest x-ray in 2 weeks.  Abnormal weight gain/BMI 43-discussed strategies around this.  We discussed that he is going to have to be willing to make some nutritional changes as well as adjustments to his activity level based on his ability to really make some lifestyle changes.  We did discuss that medication could be an option and certainly we can go down that path to try to get him to his goal for weight loss around 290 pounds so that he can have his knee replacement.  Once he has his knee replacement this will make him more successful in being active and reduce his pain.

## 2018-01-15 NOTE — Patient Instructions (Addendum)
Please call your insurance and see if they will cover Saxenda, Belviq, Contrave, or Qsymia.  If so then please let me know which one and we can start 1 of the medications.  We will still likely need to get approval from your insurance even though it may be covered.  And then you can see me back one month after you start the medication.  Please go for your chest x-ray in about 12 days.

## 2018-01-16 ENCOUNTER — Telehealth: Payer: Self-pay

## 2018-01-16 DIAGNOSIS — Z6841 Body Mass Index (BMI) 40.0 and over, adult: Secondary | ICD-10-CM

## 2018-01-16 MED ORDER — LIRAGLUTIDE -WEIGHT MANAGEMENT 18 MG/3ML ~~LOC~~ SOPN: PEN_INJECTOR | SUBCUTANEOUS | 0 refills | Status: AC

## 2018-01-16 NOTE — Telephone Encounter (Signed)
Okay, new prescription sent for Saxenda.  Will initiate prior off.

## 2018-01-16 NOTE — Telephone Encounter (Signed)
Pt's wife called and stated that she called insurance about pt's weight loss meds and she was told that all of them were non-formulary and would require a PA.  Pt's first choice is [place] and second choice is [place].   Wanting RX sent to pharmacy so that they can get a PA initiated.   Note to Dr Madilyn Fireman for RX and also FYI to Reagan to look out for this PA.  Thanks!

## 2018-01-19 ENCOUNTER — Ambulatory Visit (INDEPENDENT_AMBULATORY_CARE_PROVIDER_SITE_OTHER): Payer: Medicare Other | Admitting: Family Medicine

## 2018-01-21 NOTE — Telephone Encounter (Signed)
Information has been sent to the insurance for determination of [place].

## 2018-01-26 NOTE — Telephone Encounter (Signed)
Received a denial letter from [facility] and patient has been notified. Per patients wife (DPR) she wants to appeal this. She is also aware that the appeal may be denied as well and voices understanding.

## 2018-02-02 ENCOUNTER — Ambulatory Visit (INDEPENDENT_AMBULATORY_CARE_PROVIDER_SITE_OTHER): Payer: Medicare Other

## 2018-02-02 DIAGNOSIS — J189 Pneumonia, unspecified organism: Secondary | ICD-10-CM

## 2018-02-02 DIAGNOSIS — J181 Lobar pneumonia, unspecified organism: Secondary | ICD-10-CM | POA: Diagnosis not present

## 2018-02-02 NOTE — Telephone Encounter (Signed)
Appeal letter faxed and waiting on response. Received call from insurance that appeal will be expedited and our office will receive a form when the insurance receives an answer.

## 2018-02-03 ENCOUNTER — Ambulatory Visit (INDEPENDENT_AMBULATORY_CARE_PROVIDER_SITE_OTHER): Payer: Medicare Other | Admitting: Family Medicine

## 2018-02-03 ENCOUNTER — Encounter: Payer: Self-pay | Admitting: Family Medicine

## 2018-02-03 VITALS — BP 157/76 | HR 74 | Wt 326.0 lb

## 2018-02-03 DIAGNOSIS — R05 Cough: Secondary | ICD-10-CM

## 2018-02-03 DIAGNOSIS — R062 Wheezing: Secondary | ICD-10-CM

## 2018-02-03 DIAGNOSIS — R059 Cough, unspecified: Secondary | ICD-10-CM

## 2018-02-03 MED ORDER — ALBUTEROL SULFATE (2.5 MG/3ML) 0.083% IN NEBU: 2.5000 mg | INHALATION_SOLUTION | Freq: Four times a day (QID) | RESPIRATORY_TRACT | 1 refills | Status: DC | PRN

## 2018-02-03 MED ORDER — AMBULATORY NON FORMULARY MEDICATION: 0 refills | Status: DC

## 2018-02-03 MED ORDER — PREDNISONE 5 MG (48) PO TBPK: ORAL_TABLET | ORAL | 0 refills | Status: DC

## 2018-02-03 MED ORDER — IPRATROPIUM-ALBUTEROL 0.5-2.5 (3) MG/3ML IN SOLN
3.0000 mL | Freq: Once | RESPIRATORY_TRACT | Status: AC
  Administered 2018-02-03: 3 mL via RESPIRATORY_TRACT

## 2018-02-03 NOTE — Telephone Encounter (Signed)
[employer] will be mailing a letter to the patient per Tameka.

## 2018-02-03 NOTE — Telephone Encounter (Signed)
Please mail letter to patient in regards to appeal being denied. Since the message was with the wife and not the patient directly.

## 2018-02-03 NOTE — Patient Instructions (Addendum)
Thank you for coming in today. Take the prednisone for 12 days.  USe the nebulizer as needed.   Recheck with Dr Suzi Roots in 1 month.    How to Use a Nebulizer, Adult A nebulizer is a device that turns liquid medicine into a mist (vapor) that you can breathe in (inhale). You may need to use a nebulizer if you have a breathing illness, such as asthma or pneumonia. There are different kinds of nebulizers. With some, you breathe in through a mouthpiece. With others, a mask fits over your nose and mouth. Risks and complications Using a nebulizer that does not fit right or is not cleaned right can lead to the following complications:  Infection.  Eye irritation.  Delivery of too much medicine or not enough medicine.  Mouth irritation.  How to prepare before using a nebulizer Take these steps before using your nebulizer: 1. Check your medicine. Make sure it has not expired and is not damaged in any way. 2. Wash your hands with soap and water. 3. Put all of the parts of your nebulizer on a sturdy, flat surface. Make sure all of the tubing is connected. 4. Measure the liquid medicine according to instructions from your health care provider. Pour the liquid into the part of the nebulizer that holds the medicine (reservoir). 5. Attach the mouthpiece or mask. 6. Test the nebulizer by turning it on to make sure that a spray comes out. Then, turn it off.  How to use a nebulizer 1. Sit down and relax. 2. If your nebulizer has a mask, put it over your nose and mouth. It should fit somewhat snugly, with no gaps around the nose or cheeks where medicine could escape. If you use a mouthpiece, put it in your mouth. Press your lips firmly around the mouthpiece. 3. Turn on the nebulizer. 4. Breathe out (exhale). 5. Some nebulizers have a finger valve. If yours does, cover up the air hole so the air gets to the nebulizer. 6. Once the medicine begins to mist out, take slow, deep breaths. If there is a  finger valve, release it at the end of your breath. 7. Continue taking slow, deep breaths until the medicine in the nebulizer is gone and no vapor appears. Be sure to stop the machine at any time if you start coughing or if the medicine foams or bubbles. How to clean a nebulizer The nebulizer and all of its parts must be kept very clean. If the nebulizer and its parts are not cleaned properly, bacteria can grow inside of them. If you inhale the bacteria, you can get sick. Follow the manufacturer's instructions for cleaning your nebulizer. For most nebulizers, you should follow these guidelines:  Wash the nebulizer after each use. Use warm water and soap. Make sure to wash the mouthpiece or mask and the medicine area, but do not wash the tubing or mouthpiece.  After you wash the nebulizer, place its parts on a clean towel and let them dry completely. After they dry, reconnect the pieces and turn the nebulizer on without any medicine in it. Doing this will blow air through the equipment to help dry it out.  Store the nebulizer in a clean and dust-free place.  Check the filter at least one time every week. Replace it if it looks dirty.  Contact a health care provider if:  You continue to have trouble breathing.  You have trouble using the nebulizer.  Your breathing gets worse during a nebulizer treatment.  Your nebulizer stops working, foams, or does not create a mist after you add medicine and turn it on. This information is not intended to replace advice given to you by your health care provider. Make sure you discuss any questions you have with your health care provider. Document Released: 06/26/2009 Document Revised: 03/07/2016 Document Reviewed: 01/13/2016 Elsevier Interactive Patient Education  2018 [place]

## 2018-02-03 NOTE — Telephone Encounter (Signed)
Per Tameka at [facility] no weight loss medication is covered under this plan since its an exclusion. I attempted to call the wife and give her the information and she hung the phone in mid sentence. Please advise.

## 2018-02-03 NOTE — Progress Notes (Signed)
90

## 2018-02-03 NOTE — Progress Notes (Signed)
Mark [name] Mark [name]. is a 57 y.o. male who presents to [facility] today for cough and wheezing.  Mark [name] notes a several week history of cough fevers and wheezing.  He was seen by PCP who diagnosed community-acquired pneumonia and treated successfully with antibiotics.  He continued to have wheezing and was prescribed a prednisone course and albuterol which is helped a bit.  He felt great during the prednisone course but his wheezing and cough returned after the.  Is on course and it.  He notes that his albuterol inhaler is only mildly helpful.  He has been using cough syrup as well which is been only mildly helpful.  He notes mild shortness of breath and mild wheezing.  He notes the cough is mildly productive.  He denies fevers.   ROS as above:  Exam:  BP (!) 157/76   Pulse 74   Wt (!) 326 lb (147.9 kg)   SpO2 95%   BMI 43.01 kg/m  Gen: Well NAD nontoxic appearing HEENT: EOMI,  MMM Lungs: Normal work of breathing.  Coarse breath sounds with prolonged expiratory phase bilaterally. Heart: RRR no MRG Abd: NABS, Soft. Nondistended, Nontender Exts: Brisk capillary refill, warm and well perfused.   Patient was given a 2.5/0.5 mg DuoNeb nebulizer treatment and felt better.  Pulmonary exam improved.  Lab and Radiology Results No results found for this or any previous visit (from the past 72 hour(s)). Dg Chest 2 View  Result Date: 02/02/2018 CLINICAL DATA:  Follow-up right upper lobe pneumonia. EXAM: CHEST - 2 VIEW COMPARISON:  Chest x-ray dated October 02, 2015. FINDINGS: Stable mild cardiomegaly. Normal pulmonary vascularity. No focal consolidation, pleural effusion, or pneumothorax. No acute osseous abnormality. Prior cervical and thoracolumbar fusions. IMPRESSION: No active cardiopulmonary disease. Electronically Signed   By: Mark [name] M.D.   On: 02/02/2018 15:19    I personally (independently) visualized and performed the interpretation of the images attached in this note.    Assessment and Plan: 57 y.o. male with  Wheezing following respiratory illness likely viral bronchitis.  Patient benefited from steroids.  Plan to use steroid Dosepak for longer course of treatment.  Additionally will prescribe a nebulizer for use at home.  Will prescribe albuterol nebulizer solution as well.  Warned against heavy use greater than prescribed.  Follow-up with PCP in 1 month.  Return sooner if needed.    No orders of the defined types were placed in this encounter.  Meds ordered this encounter  Medications  . ipratropium-albuterol (DUONEB) 0.5-2.5 (3) MG/3ML nebulizer solution 3 mL  . predniSONE (STERAPRED UNI-PAK 48 TAB) 5 MG (48) TBPK tablet    Sig: 12 day dosepack po    Dispense:  48 tablet    Refill:  0  . albuterol (PROVENTIL) (2.5 MG/3ML) 0.083% nebulizer solution    Sig: Take 3 mLs (2.5 mg total) by nebulization every 6 (six) hours as needed for wheezing or shortness of breath.    Dispense:  150 mL    Refill:  1  . AMBULATORY NON FORMULARY MEDICATION    Sig: Nebulizer use as needed. R06.2    Dispense:  1 each    Refill:  0     Historical information moved to improve visibility of documentation.  Past Medical History:  Diagnosis Date  . Anxiety    chronic pain treated with narcotics  . Arthritis   . Chronic pain   . Constipation due to  opioid therapy   . Depression   . Dysphonia    thoracic left sided"impingement" with heminumbness  . GI bleed    abt 15 years ago  . Morbid obesity ([place])   . MS (multiple sclerosis) ([place]) 02/11/2013   [name]  . MS (multiple sclerosis) ([place])   . Neuromuscular disorder ([place])   . Pneumonia   . Sleep apnea with use of continuous positive airway pressure (CPAP) 02/11/2013   07-13-12 AHi of 98.6 /hr titrated to 11 cm water ,  average user time 4 hours and 4 minutes. Residual AHI 1.20 December 2012  .  Marland Kitchen Umbilical hernia    Past Surgical History:  Procedure Laterality Date  . CARPAL TUNNEL RELEASE Left    [name]  . COLONOSCOPY W/ POLYPECTOMY    . POSTERIOR LUMBAR FUSION  04/17/15   [name], also removed old hardware  . SPINE SURGERY     total of six vertebras fused, three lumbar surgeries and one anterior neck fusion  . TONSILLECTOMY     Social History   Tobacco Use  . Smoking status: Never Smoker  . Smokeless tobacco: Never Used  Substance Use Topics  . Alcohol use: Yes    Comment: occasionally   family history includes Cancer in his other and other; Sleep walking in his son.  Medications: Current Outpatient Medications  Medication Sig Dispense Refill  . amphetamine-dextroamphetamine (ADDERALL XR) 30 MG 24 hr capsule Take 30 mg by mouth.    . diazepam (VALIUM) 5 MG tablet TAKE 1-4 TABLETS BY MOUTH AS NEEDED FOR MRI  0  . DULoxetine (CYMBALTA) 60 MG capsule Take 60 mg by mouth 2 (two) times daily.     Marland Kitchen gabapentin (NEURONTIN) 800 MG tablet Take 800 mg by mouth 4 (four) times daily.  11  . HYDROcodone-acetaminophen (NORCO) 10-325 MG per tablet Take 2 tablets by mouth every 4 (four) hours as needed for severe pain.     Marland Kitchen ibuprofen (ADVIL,MOTRIN) 800 MG tablet Take 800 mg by mouth every 8 (eight) hours as needed for moderate pain.    . tamsulosin (FLOMAX) 0.4 MG CAPS capsule Take 0.4 mg by mouth at bedtime.     . Teriflunomide (AUBAGIO) 14 MG TABS Take 14 mg by mouth at bedtime. Dr Rae Mar    . VENTOLIN HFA 108 (90 Base) MCG/ACT inhaler   0  . albuterol (PROVENTIL) (2.5 MG/3ML) 0.083% nebulizer solution Take 3 mLs (2.5 mg total) by nebulization every 6 (six) hours as needed for wheezing or shortness of breath. 150 mL 1  . AMBULATORY NON FORMULARY MEDICATION Nebulizer use as needed. R06.2 1 each 0  . Liraglutide -Weight Management (SAXENDA) 18 MG/3ML SOPN Inject 0.6 mg into the skin daily for 7 days, THEN 1.2 mg daily for 7 days, THEN 1.8 mg daily for 14 days. (Patient  not taking: No sig reported) 6 mL 0  . predniSONE (STERAPRED UNI-PAK 48 TAB) 5 MG (48) TBPK tablet 12 day dosepack po 48 tablet 0   No current facility-administered medications for this visit.    No Known Allergies   Discussed warning signs or symptoms. Please see discharge instructions. Patient expresses understanding.

## 2018-03-06 ENCOUNTER — Encounter: Payer: Self-pay | Admitting: Family Medicine

## 2018-03-06 ENCOUNTER — Telehealth: Payer: Self-pay | Admitting: Family Medicine

## 2018-03-06 ENCOUNTER — Ambulatory Visit (INDEPENDENT_AMBULATORY_CARE_PROVIDER_SITE_OTHER): Payer: Medicare Other | Admitting: Family Medicine

## 2018-03-06 VITALS — BP 130/72 | HR 79 | Ht 73.0 in | Wt 323.0 lb

## 2018-03-06 DIAGNOSIS — R05 Cough: Secondary | ICD-10-CM

## 2018-03-06 DIAGNOSIS — R5383 Other fatigue: Secondary | ICD-10-CM | POA: Diagnosis not present

## 2018-03-06 DIAGNOSIS — R0602 Shortness of breath: Secondary | ICD-10-CM

## 2018-03-06 DIAGNOSIS — R053 Chronic cough: Secondary | ICD-10-CM

## 2018-03-06 MED ORDER — PREDNISONE 5 MG (48) PO TBPK: ORAL_TABLET | ORAL | 0 refills | Status: DC

## 2018-03-06 NOTE — Progress Notes (Signed)
Subjective:    Patient ID: Mark Spitz., male    DOB: May 23, 1961, 57 y.o.   MRN: [id number]  HPI Mark [name] is a 57 year old male who is here today to follow-up for persistent cough.  He was actually diagnosed with RUL  pneumonia by chest x-ray in June and was treated.  He did feel significantly better but his cough and wheezing persisted.  He then came and saw 1 of my partners who prescribed him an albuterol nebulizer as well as some prednisone.,  He did have a repeat chest x-ray done in June which was clear. Cough is worse in the AM and PM and when he reclines.  He has a copper taste in his mouth.  Cough is mostly dry.  Very rarely gets some sputum with it.  He says taking the prednisone does help.  Each time he took it he got significant relief in his symptoms and as soon as it wore off the symptoms would come back.  He has had some wheezing mostly when he is lying down at night but not so much during the day.  He denies any sinus symptoms such as congestion facial pressure etc.  He said he did have a couple nights while they were on vacation where he was having night sweats and waking up feeling completely soaked around his back and neck.  He just feels extremely tired and has so for about the last 3 months.  In regards to his obesity-he did because insurance to check on medications we have and try to get prior authorization on 1 of them but the request was denied.  Review of Systems  BP 130/72   Pulse 79   Ht 6\' 1"  (1.854 m)   Wt (!) 323 lb (146.5 kg)   SpO2 97%   PF 450 L/min Comment: green zone=608, pt in yellow zone  BMI 42.61 kg/m     No Known Allergies  Past Medical History:  Diagnosis Date  . Anxiety    chronic pain treated with narcotics  . Arthritis   . Chronic pain   . Constipation due to opioid therapy   . Depression   . Dysphonia    thoracic left sided"impingement" with heminumbness  . GI bleed    abt 15 years ago  . Morbid obesity ([place])   . MS (multiple  sclerosis) ([place]) 02/11/2013   [name]  . MS (multiple sclerosis) ([place])   . Neuromuscular disorder ([place])   . Pneumonia   . Sleep apnea with use of continuous positive airway pressure (CPAP) 02/11/2013   07-13-12 AHi of 98.6 /hr titrated to 11 cm water ,  average user time 4 hours and 4 minutes. Residual AHI 1.20 December 2012 .  Marland Kitchen Umbilical hernia     Past Surgical History:  Procedure Laterality Date  . CARPAL TUNNEL RELEASE Left    [name]  . COLONOSCOPY W/ POLYPECTOMY    . POSTERIOR LUMBAR FUSION  04/17/15   [name], also removed old hardware  . SPINE SURGERY     total of six vertebras fused, three lumbar surgeries and one anterior neck fusion  . TONSILLECTOMY      Social History   Socioeconomic History  . Marital status: Married    Spouse name: [name]  . Number of children: 2  . Years of education: 12+  . Highest education level: Not on file  Occupational History  . Not on file  Social Needs  . Financial  resource strain: Not on file  . Food insecurity:    Worry: Not on file    Inability: Not on file  . Transportation needs:    Medical: Not on file    Non-medical: Not on file  Tobacco Use  . Smoking status: Never Smoker  . Smokeless tobacco: Never Used  Substance and Sexual Activity  . Alcohol use: Yes    Comment: occasionally  . Drug use: No  . Sexual activity: Not on file  Lifestyle  . Physical activity:    Days per week: Not on file    Minutes per session: Not on file  . Stress: Not on file  Relationships  . Social connections:    Talks on phone: Not on file    Gets together: Not on file    Attends religious service: Not on file    Active member of club or organization: Not on file    Attends meetings of clubs or organizations: Not on file    Relationship status: Not on file  . Intimate partner violence:    Fear of current or ex partner: Not on file    Emotionally abused: Not on file    Physically abused: Not on file    Forced sexual  activity: Not on file  Other Topics Concern  . Not on file  Social History Narrative   Last download from 2011 showed good response to 12 cm water with 3 cm EPR, but we have no record of the baseline AI.    Need to order  the old paper copies and send patient for retitration  to lab, after five years plus this will be a SPLIT at 15 and at 3% score. This calendar year preferred for deductible costs.    Patient is married [name]) and lives at home with his wife, daughter, son-in-law and grandchild.   Patient has two adult children.   Patient is right-handed.   Patient is disabled.   Patient has a college education.   Patient drinks a gallon of tea daily.       Family History  Problem Relation Age of Onset  . Cancer Other   . Cancer Other   . Sleep walking Son     Outpatient Encounter Medications as of 03/06/2018  Medication Sig  . amphetamine-dextroamphetamine (ADDERALL XR) 30 MG 24 hr capsule Take 30 mg by mouth.  . DULoxetine (CYMBALTA) 60 MG capsule Take 60 mg by mouth 2 (two) times daily.   Marland Kitchen gabapentin (NEURONTIN) 800 MG tablet Take 800 mg by mouth 4 (four) times daily.  Marland Kitchen HYDROcodone-acetaminophen (NORCO) 10-325 MG per tablet Take 2 tablets by mouth every 4 (four) hours as needed for severe pain.   Marland Kitchen ibuprofen (ADVIL,MOTRIN) 800 MG tablet Take 800 mg by mouth every 8 (eight) hours as needed for moderate pain.  . tamsulosin (FLOMAX) 0.4 MG CAPS capsule Take 0.4 mg by mouth at bedtime.   . Teriflunomide (AUBAGIO) 14 MG TABS Take 14 mg by mouth at bedtime. Dr Rae Mar  . VENTOLIN HFA 108 (90 Base) MCG/ACT inhaler   . [DISCONTINUED] diazepam (VALIUM) 5 MG tablet TAKE 1-4 TABLETS BY MOUTH AS NEEDED FOR MRI  . albuterol (PROVENTIL) (2.5 MG/3ML) 0.083% nebulizer solution Take 3 mLs (2.5 mg total) by nebulization every 6 (six) hours as needed for wheezing or shortness of breath. (Patient not taking: Reported on 03/06/2018)  . AMBULATORY NON FORMULARY MEDICATION Nebulizer use as  needed. R06.2 (Patient not taking: Reported on 03/06/2018)  . [  DISCONTINUED] predniSONE (STERAPRED UNI-PAK 48 TAB) 5 MG (48) TBPK tablet 12 day dosepack po   No facility-administered encounter medications on file as of 03/06/2018.          Objective:   Physical Exam  Constitutional: He is oriented to person, place, and time. He appears well-developed and well-nourished.  HENT:  Head: Normocephalic and atraumatic.  Cardiovascular: Normal rate, regular rhythm and normal heart sounds.  Pulmonary/Chest: Effort normal and breath sounds normal.  Neurological: He is alert and oriented to person, place, and time.  Skin: Skin is warm and dry.  Psychiatric: He has a normal mood and affect. His behavior is normal.        Assessment & Plan:  Persistent cough for 3 months now originally starting with right upper lobe pneumonia.  Some of this may just be postinfectious cough I am concerned because he still feeling significantly short of breath and fatigue which is a little unusual at this point.  I do think we should work this up further.  I am in order ahead and order a CT of the chest as well as schedule him for spirometry.  We did have him do peak flows in the office just to get an idea of airflow.  His best was 450 which would technically be in the yellow zone for him.  He does have an albuterol inhaler at home to use as needed just encouraged him not to use of the daily comes in for his spirometry.  With his history of MS it is concerning that he may have some progression that could even be affecting his diaphragm to some degree.  He is not sure when his next appointment is with neurology.  We will go ahead and get a CBC as well since he was having a couple nights with night sweats as well as a TB skin test.  We will check a CMP and a TSH.

## 2018-03-06 NOTE — Telephone Encounter (Signed)
Patient called after hours line. He was seen today and Dr Madilyn Fireman stated that she was going to prescribe prednisone but did not.  I prescribed it to the pharmacy. After hours nurse will inform pt.

## 2018-03-09 ENCOUNTER — Ambulatory Visit (INDEPENDENT_AMBULATORY_CARE_PROVIDER_SITE_OTHER): Payer: Medicare Other

## 2018-03-09 DIAGNOSIS — R5383 Other fatigue: Secondary | ICD-10-CM

## 2018-03-09 DIAGNOSIS — R053 Chronic cough: Secondary | ICD-10-CM

## 2018-03-09 DIAGNOSIS — R05 Cough: Secondary | ICD-10-CM | POA: Diagnosis not present

## 2018-03-09 DIAGNOSIS — R0602 Shortness of breath: Secondary | ICD-10-CM | POA: Diagnosis not present

## 2018-03-10 ENCOUNTER — Other Ambulatory Visit: Payer: Self-pay | Admitting: *Deleted

## 2018-03-10 DIAGNOSIS — E876 Hypokalemia: Secondary | ICD-10-CM

## 2018-03-11 ENCOUNTER — Ambulatory Visit (INDEPENDENT_AMBULATORY_CARE_PROVIDER_SITE_OTHER): Payer: Medicare Other | Admitting: Family Medicine

## 2018-03-11 VITALS — BP 159/84 | HR 68 | Ht 73.0 in | Wt 327.0 lb

## 2018-03-11 DIAGNOSIS — J984 Other disorders of lung: Secondary | ICD-10-CM | POA: Diagnosis not present

## 2018-03-11 DIAGNOSIS — R059 Cough, unspecified: Secondary | ICD-10-CM

## 2018-03-11 DIAGNOSIS — R0602 Shortness of breath: Secondary | ICD-10-CM

## 2018-03-11 DIAGNOSIS — R05 Cough: Secondary | ICD-10-CM | POA: Diagnosis not present

## 2018-03-11 MED ORDER — ALBUTEROL SULFATE (2.5 MG/3ML) 0.083% IN NEBU
2.5000 mg | INHALATION_SOLUTION | Freq: Once | RESPIRATORY_TRACT | Status: AC
  Administered 2018-03-11: 2.5 mg via RESPIRATORY_TRACT

## 2018-03-11 NOTE — Progress Notes (Signed)
   Subjective:    Patient ID: Mark Spitz., male    DOB: [birth year], 57 y.o.   MRN: [id number]  HPI 57 year old male is here today to follow-up on shortness of breath and chronic cough.  He was initially diagnosed with pneumonia of the right upper lobe around June 27 and since then has had this persistent cough as well as some increased shortness of breath.  Over his lifetime he has had multiple exposures to chemicals increase in oil.  Never wore facemasks or filters.  Had any chest pain.  Says that probably about 6 or 7 years ago he was seen by cardiology, Dr. Diamantina Monks and had a pretty thorough cardiac work-up which was normal.  His wife and 2 small children were here with him for the appointment today.  Review of Systems     Objective:   Physical Exam  Constitutional: He is oriented to person, place, and time. He appears well-developed and well-nourished.  HENT:  Head: Normocephalic and atraumatic.  Eyes: Conjunctivae and EOM are normal.  Cardiovascular: Normal rate.  Pulmonary/Chest: Effort normal.  Neurological: He is alert and oriented to person, place, and time.  Skin: Skin is dry. No pallor.  Psychiatric: He has a normal mood and affect. His behavior is normal.  Vitals reviewed.         Assessment & Plan:  Shortness breath/chronic cough-telemetry today shows FVC of 50% with an FEV1 of 55% a ratio of 82%.  No significant improvement in FEV1 after albuterol though he said he felt better after the albuterol.  Most consistent with some type of restrictive lung disease.  We discussed diagnosis and that this will need additional work-up.  It could be from obesity versus scar tissue from environmental exposure, versus some type of issue with his diaphragm since he does have MS.  Time spent 20 minutes, greater than 50% time discussing shortness of breath, going over test results, and discussing further evaluation.

## 2018-03-12 LAB — TSH: TSH: 0.33 mIU/L — ABNORMAL LOW (ref 0.40–4.50)

## 2018-03-12 LAB — CBC WITH DIFFERENTIAL/PLATELET
BASOS PCT: 0.3 %
Basophils Absolute: 27 cells/uL (ref 0–200)
EOS ABS: 18 {cells}/uL (ref 15–500)
Eosinophils Relative: 0.2 %
HCT: 38.5 % (ref 38.5–50.0)
Hemoglobin: 12.7 g/dL — ABNORMAL LOW (ref 13.2–17.1)
LYMPHS ABS: 1338 {cells}/uL (ref 850–3900)
MCH: 28.5 pg (ref 27.0–33.0)
MCHC: 33 g/dL (ref 32.0–36.0)
MCV: 86.5 fL (ref 80.0–100.0)
MPV: 10.2 fL (ref 7.5–12.5)
Monocytes Relative: 4 %
NEUTROS PCT: 80.8 %
Neutro Abs: 7353 cells/uL (ref 1500–7800)
PLATELETS: 268 10*3/uL (ref 140–400)
RBC: 4.45 10*6/uL (ref 4.20–5.80)
RDW: 13.2 % (ref 11.0–15.0)
TOTAL LYMPHOCYTE: 14.7 %
WBC: 9.1 10*3/uL (ref 3.8–10.8)
WBCMIX: 364 {cells}/uL (ref 200–950)

## 2018-03-12 LAB — COMPLETE METABOLIC PANEL WITH GFR
AG Ratio: 2 (calc) (ref 1.0–2.5)
ALBUMIN MSPROF: 4.2 g/dL (ref 3.6–5.1)
ALKALINE PHOSPHATASE (APISO): 77 U/L (ref 40–115)
ALT: 19 U/L (ref 9–46)
AST: 12 U/L (ref 10–35)
BILIRUBIN TOTAL: 0.3 mg/dL (ref 0.2–1.2)
BUN / CREAT RATIO: 19 (calc) (ref 6–22)
BUN: 13 mg/dL (ref 7–25)
CHLORIDE: 102 mmol/L (ref 98–110)
CO2: 29 mmol/L (ref 20–32)
Calcium: 9.2 mg/dL (ref 8.6–10.3)
Creat: 0.69 mg/dL — ABNORMAL LOW (ref 0.70–1.33)
GFR, Est African American: 123 mL/min/{1.73_m2} (ref >=60)
GFR, Est Non African American: 106 mL/min/{1.73_m2} (ref >=60)
GLOBULIN: 2.1 g/dL (ref 1.9–3.7)
GLUCOSE: 183 mg/dL — AB (ref 65–99)
POTASSIUM: 3.2 mmol/L — AB (ref 3.5–5.3)
SODIUM: 140 mmol/L (ref 135–146)
Total Protein: 6.3 g/dL (ref 6.1–8.1)

## 2018-03-12 LAB — VITAMIN D 25 HYDROXY (VIT D DEFICIENCY, FRACTURES): VIT D 25 HYDROXY: 27 ng/mL — AB (ref 30–100)

## 2018-03-12 LAB — QUANTIFERON-TB GOLD PLUS
NIL: 0.03 IU/mL
QuantiFERON-TB Gold Plus: NEGATIVE
TB1-NIL: 0.01 IU/mL
TB2-NIL: 0.01 IU/mL

## 2018-03-12 LAB — VITAMIN B12: Vitamin B-12: 419 pg/mL (ref 200–1100)

## 2018-03-12 LAB — T4, FREE: FREE T4: 0.9 ng/dL (ref 0.8–1.8)

## 2018-03-12 LAB — FERRITIN: Ferritin: 149 ng/mL (ref 38–380)

## 2018-03-26 ENCOUNTER — Encounter: Payer: Self-pay | Admitting: Family Medicine

## 2018-03-27 DIAGNOSIS — R0602 Shortness of breath: Secondary | ICD-10-CM | POA: Diagnosis not present

## 2018-03-27 DIAGNOSIS — R062 Wheezing: Secondary | ICD-10-CM | POA: Diagnosis not present

## 2018-04-02 DIAGNOSIS — M542 Cervicalgia: Secondary | ICD-10-CM | POA: Diagnosis not present

## 2018-04-02 DIAGNOSIS — M5440 Lumbago with sciatica, unspecified side: Secondary | ICD-10-CM | POA: Diagnosis not present

## 2018-04-14 DIAGNOSIS — G4733 Obstructive sleep apnea (adult) (pediatric): Secondary | ICD-10-CM | POA: Diagnosis not present

## 2018-04-15 DIAGNOSIS — Z79899 Other long term (current) drug therapy: Secondary | ICD-10-CM | POA: Diagnosis not present

## 2018-04-15 DIAGNOSIS — R208 Other disturbances of skin sensation: Secondary | ICD-10-CM | POA: Diagnosis not present

## 2018-04-15 DIAGNOSIS — G35 Multiple sclerosis: Secondary | ICD-10-CM | POA: Diagnosis not present

## 2018-04-15 DIAGNOSIS — Z5181 Encounter for therapeutic drug level monitoring: Secondary | ICD-10-CM | POA: Diagnosis not present

## 2018-04-15 DIAGNOSIS — G629 Polyneuropathy, unspecified: Secondary | ICD-10-CM | POA: Diagnosis not present

## 2018-04-16 DIAGNOSIS — G4733 Obstructive sleep apnea (adult) (pediatric): Secondary | ICD-10-CM | POA: Diagnosis not present

## 2018-04-16 DIAGNOSIS — Z6841 Body Mass Index (BMI) 40.0 and over, adult: Secondary | ICD-10-CM | POA: Diagnosis not present

## 2018-04-16 DIAGNOSIS — Z9989 Dependence on other enabling machines and devices: Secondary | ICD-10-CM | POA: Diagnosis not present

## 2018-04-16 DIAGNOSIS — R0602 Shortness of breath: Secondary | ICD-10-CM | POA: Diagnosis not present

## 2018-04-22 DIAGNOSIS — M25561 Pain in right knee: Secondary | ICD-10-CM | POA: Diagnosis not present

## 2018-04-22 DIAGNOSIS — G35 Multiple sclerosis: Secondary | ICD-10-CM | POA: Diagnosis not present

## 2018-04-22 DIAGNOSIS — G939 Disorder of brain, unspecified: Secondary | ICD-10-CM | POA: Diagnosis not present

## 2018-04-22 DIAGNOSIS — M25562 Pain in left knee: Secondary | ICD-10-CM | POA: Diagnosis not present

## 2018-04-30 ENCOUNTER — Institutional Professional Consult (permissible substitution): Payer: Medicare Other | Admitting: Pulmonary Disease

## 2018-05-14 DIAGNOSIS — R0602 Shortness of breath: Secondary | ICD-10-CM | POA: Diagnosis not present

## 2018-05-20 ENCOUNTER — Telehealth: Payer: Self-pay | Admitting: Family Medicine

## 2018-05-20 DIAGNOSIS — I517 Cardiomegaly: Secondary | ICD-10-CM

## 2018-05-20 NOTE — Telephone Encounter (Signed)
Received echocardiogram results from [facility] cardiology.  Echocardiogram shows severe concentric left ventricular hypertrophy with normal diastolic function.  No significant valvular dysfunction.  Will send copy of note to scan and inform PCP.

## 2018-05-20 NOTE — Telephone Encounter (Signed)
Spoke w/pt's wife and she stated that he has only been seen by pulmonology and will need a referral to cardiology. She is ok with either [place] or [place].Elouise Munroe, Angie

## 2018-05-20 NOTE — Telephone Encounter (Signed)
Please call patient and let him know that we did receive the results of his echocardiogram he was noted to have some thickening of the left ventricular wall of his heart.  This often comes from uncontrolled high blood pressure.  When looking back at his blood pressures he has had some high pressures but is also had some normal pressures.  I would like to get him in with cardiology.  Not sure if he has seen someone previously.  I also went to check to see if he ever saw pulmonology.  I know we referred him at the end of August but I did not see any notes from them that he had been seen I does want to make sure that he was contacted.  Said really like to get him in both with cardiology and pulmonology.

## 2018-05-21 NOTE — Telephone Encounter (Signed)
Referral placed.  Please have him check his blood pressure at home a couple times a week.  If he is seeing pressures above 135 then please let me know.  We may need need to start a new blood pressure medication.

## 2018-05-22 NOTE — Telephone Encounter (Signed)
Pt's wife informed of recommendations. They will keep an eye on his bp. She stated that he has an appt w/pulm on Monday. Maryruth Eve, Lahoma Crocker, CMA

## 2018-05-25 DIAGNOSIS — G4733 Obstructive sleep apnea (adult) (pediatric): Secondary | ICD-10-CM | POA: Diagnosis not present

## 2018-05-25 DIAGNOSIS — R0602 Shortness of breath: Secondary | ICD-10-CM | POA: Diagnosis not present

## 2018-05-25 DIAGNOSIS — Z9989 Dependence on other enabling machines and devices: Secondary | ICD-10-CM | POA: Diagnosis not present

## 2018-05-25 DIAGNOSIS — R062 Wheezing: Secondary | ICD-10-CM | POA: Diagnosis not present

## 2018-06-04 ENCOUNTER — Encounter: Payer: Self-pay | Admitting: Cardiology

## 2018-06-11 DIAGNOSIS — M5124 Other intervertebral disc displacement, thoracic region: Secondary | ICD-10-CM | POA: Diagnosis not present

## 2018-06-11 DIAGNOSIS — M5440 Lumbago with sciatica, unspecified side: Secondary | ICD-10-CM | POA: Diagnosis not present

## 2018-06-15 ENCOUNTER — Telehealth: Payer: Self-pay

## 2018-06-15 NOTE — Telephone Encounter (Signed)
Linus Orn, Allen's wife, called and states his blood pressure has been elevated. She states the systolic has been slightly elevated. However the diastolic has been over 939 the last 4 readings. She states Dr Madilyn Fireman wanted to get updates on his blood pressure readings.

## 2018-06-15 NOTE — Progress Notes (Signed)
Referring-Mark Metheney MD Reason for referral-hypertension and abnormal echocardiogram  HPI: 57 year old male for evaluation of hypertension and abnormal echocardiogram at request of Mark Lecher MD.  Echocardiogram October 2019 showed normal LV function and severe left ventricular hypertrophy. Patient has dyspnea with more vigorous activities but not routine activities.  He is limited by back pain and arthritis.  He denies orthopnea, PND, increased pedal edema, chest pain, palpitations or syncope.  Cardiology asked to evaluate.  Current Outpatient Medications  Medication Sig Dispense Refill  . albuterol (PROVENTIL) (2.5 MG/3ML) 0.083% nebulizer solution Take 3 mLs (2.5 mg total) by nebulization every 6 (six) hours as needed for wheezing or shortness of breath. 150 mL 1  . AMBULATORY NON FORMULARY MEDICATION Nebulizer use as needed. R06.2 1 each 0  . amphetamine-dextroamphetamine (ADDERALL XR) 30 MG 24 hr capsule Take 30 mg by mouth.    . DULoxetine (CYMBALTA) 60 MG capsule Take 60 mg by mouth 2 (two) times daily.     Marland Kitchen gabapentin (NEURONTIN) 800 MG tablet Take 800 mg by mouth 4 (four) times daily.  11  . HYDROcodone-acetaminophen (NORCO) 10-325 MG per tablet Take 2 tablets by mouth every 4 (four) hours as needed for severe pain.     Marland Kitchen ibuprofen (ADVIL,MOTRIN) 800 MG tablet Take 800 mg by mouth every 8 (eight) hours as needed for moderate pain.    Marland Kitchen lisinopril-hydrochlorothiazide (PRINZIDE,ZESTORETIC) 10-12.5 MG tablet Take 1 tablet by mouth daily. 30 tablet 1  . tamsulosin (FLOMAX) 0.4 MG CAPS capsule Take 0.4 mg by mouth at bedtime.     . Teriflunomide (AUBAGIO) 14 MG TABS Take 14 mg by mouth at bedtime. Dr Mark [name]    . VENTOLIN HFA 108 (90 Base) MCG/ACT inhaler   0   No current facility-administered medications for this visit.     No Known Allergies   Past Medical History:  Diagnosis Date  . Anxiety    chronic pain treated with narcotics  . Arthritis   .  Chronic pain   . Constipation due to opioid therapy   . Depression   . Dysphonia    thoracic left sided"impingement" with heminumbness  . GI bleed    abt 15 years ago  . Morbid obesity ([place])   . MS (multiple sclerosis) ([place]) 02/11/2013   [name]  . Pneumonia   . Sleep apnea with use of continuous positive airway pressure (CPAP) 02/11/2013   07-13-12 AHi of 98.6 /hr titrated to 11 cm water ,  average user time 4 hours and 4 minutes. Residual AHI 1.20 December 2012 .  Marland Kitchen Umbilical hernia     Past Surgical History:  Procedure Laterality Date  . CARPAL TUNNEL RELEASE Left    [name]  . COLONOSCOPY W/ POLYPECTOMY    . POSTERIOR LUMBAR FUSION  04/17/15   [name], also removed old hardware  . SPINE SURGERY     total of six vertebras fused, three lumbar surgeries and one anterior neck fusion  . TONSILLECTOMY      Social History   Socioeconomic History  . Marital status: Married    Spouse name: Mark [name]  . Number of children: 2  . Years of education: 12+  . Highest education level: Not on file  Occupational History  . Not on file  Social Needs  . Financial resource strain: Not on file  . Food insecurity:    Worry: Not on file    Inability: Not on file  . Transportation needs:  Medical: Not on file    Non-medical: Not on file  Tobacco Use  . Smoking status: Never Smoker  . Smokeless tobacco: Never Used  Substance and Sexual Activity  . Alcohol use: Yes    Comment: occasionally  . Drug use: No  . Sexual activity: Not on file  Lifestyle  . Physical activity:    Days per week: Not on file    Minutes per session: Not on file  . Stress: Not on file  Relationships  . Social connections:    Talks on phone: Not on file    Gets together: Not on file    Attends religious service: Not on file    Active member of club or organization: Not on file    Attends meetings of clubs or organizations: Not on file    Relationship status: Not on file  . Intimate partner  violence:    Fear of current or ex partner: Not on file    Emotionally abused: Not on file    Physically abused: Not on file    Forced sexual activity: Not on file  Other Topics Concern  . Not on file  Social History Narrative   Last download from 2011 showed good response to 12 cm water with 3 cm EPR, but we have no record of the baseline AI.    Need to order  the old paper copies and send patient for retitration  to lab, after five years plus this will be a SPLIT at 15 and at 3% score. This calendar year preferred for deductible costs.    Patient is married Mark [name]) and lives at home with his wife, daughter, son-in-law and grandchild.   Patient has two adult children.   Patient is right-handed.   Patient is disabled.   Patient has a college education.   Patient drinks a gallon of tea daily.       Family History  Problem Relation Age of Onset  . Arrhythmia Mother   . Cancer Other   . Cancer Other   . Sleep walking Son     ROS: Back pain but no fevers or chills, productive cough, hemoptysis, dysphasia, odynophagia, melena, hematochezia, dysuria, hematuria, rash, seizure activity, orthopnea, PND, pedal edema, claudication. Remaining systems are negative.  Physical Exam:   Blood pressure 132/62, pulse 95, height 6\' 1"  (1.854 m), weight (!) 310 lb 12.8 oz (141 kg).  General:  Well developed/obese in NAD Skin warm/dry, tattoos Patient not depressed No peripheral clubbing Back-normal HEENT-normal/normal eyelids Neck supple/normal carotid upstroke bilaterally; no bruits; no JVD; no thyromegaly chest - CTA/ normal expansion CV - RRR/normal S1 and S2; no murmurs, rubs or gallops;  PMI nondisplaced Abdomen -NT/ND, no HSM, no mass, + bowel sounds, no bruit, umbilical hernia 2+ femoral pulses, no bruits Ext-no edema, chords, 2+ DP Neuro-grossly nonfocal  ECG -sinus rhythm at a rate of 95, RV conduction delay.  Right axis deviation.  Personally reviewed  A/P  1  hypertension-recently diagnosed.  He was started on lisinopril HCT 4 days ago with significant improvement.  We will continue present dose and he will follow blood pressure at home.  We will advance as needed.  Check potassium and renal function in 1 week.  We discussed lifestyle modification including low-sodium diet and weight loss.  2 abnormal echocardiogram-severe left ventricular hypertrophy noted by report.  I will ask for images to review.  May be secondary to hypertension.  We will treat blood pressure and plan repeat study in the  future.  3 morbid obesity-we discussed the importance of diet, exercise and weight loss.  4 obstructive sleep apnea-I stressed the importance of compliance with CPAP.  Kirk Ruths, MD

## 2018-06-16 ENCOUNTER — Other Ambulatory Visit: Payer: Self-pay | Admitting: Neurosurgery

## 2018-06-16 DIAGNOSIS — M5440 Lumbago with sciatica, unspecified side: Secondary | ICD-10-CM

## 2018-06-16 DIAGNOSIS — M5124 Other intervertebral disc displacement, thoracic region: Secondary | ICD-10-CM

## 2018-06-16 NOTE — Telephone Encounter (Signed)
It definitely sounds like they are high and this needs to be treated.  Were these pressures while he was taking the Adderall?  If so then I want him to stop the Adderall for 1 week and check his pressure daily and record those and give [place] a call back.  If he was off the Adderall for most of his blood pressure readings then we really need to go ahead and start medication.

## 2018-06-16 NOTE — Telephone Encounter (Signed)
Pt's wife advised. She will have pt stop adderall for a week and check BP daily. Will call back with readings.

## 2018-06-22 MED ORDER — LISINOPRIL-HYDROCHLOROTHIAZIDE 10-12.5 MG PO TABS: 1.0000 | ORAL_TABLET | Freq: Every day | ORAL | 1 refills | Status: DC

## 2018-06-22 NOTE — Addendum Note (Signed)
Addended by: Narda Rutherford on: 06/22/2018 01:38 PM   Modules accepted: Orders

## 2018-06-22 NOTE — Telephone Encounter (Signed)
Medication sent. Patient advised and scheduled.

## 2018-06-22 NOTE — Telephone Encounter (Signed)
Zenia Resides stopped the Adderall on Wednesday.   Thursday 154/95 151/92 Friday 156/96 Saturday 148/102 153/109 Sunday 139/99 154/103 145/96 Monday 169/102

## 2018-06-22 NOTE — Addendum Note (Signed)
Addended by: Beatrice Lecher D on: 06/22/2018 12:02 PM   Modules accepted: Orders

## 2018-06-22 NOTE — Telephone Encounter (Signed)
Okay, it looks like he does need to start blood pressure medication.  New prescription pended.  I did not see a pharmacy selected.  Please send and then have him follow-up with me or nurse visit in 3 to 4 weeks.

## 2018-06-24 ENCOUNTER — Ambulatory Visit
Admission: RE | Admit: 2018-06-24 | Discharge: 2018-06-24 | Disposition: A | Discharge status: Home / Self Care | Payer: Medicare Other | Source: Ambulatory Visit | Attending: Neurosurgery | Admitting: Neurosurgery

## 2018-06-24 ENCOUNTER — Encounter: Payer: Self-pay | Admitting: Cardiology

## 2018-06-24 ENCOUNTER — Ambulatory Visit: Payer: Medicare Other | Admitting: Cardiology

## 2018-06-24 VITALS — BP 132/62 | HR 95 | Ht 73.0 in | Wt 310.8 lb

## 2018-06-24 DIAGNOSIS — I10 Essential (primary) hypertension: Secondary | ICD-10-CM

## 2018-06-24 DIAGNOSIS — R931 Abnormal findings on diagnostic imaging of heart and coronary circulation: Secondary | ICD-10-CM | POA: Diagnosis not present

## 2018-06-24 DIAGNOSIS — M4804 Spinal stenosis, thoracic region: Secondary | ICD-10-CM | POA: Diagnosis not present

## 2018-06-24 DIAGNOSIS — M5124 Other intervertebral disc displacement, thoracic region: Secondary | ICD-10-CM

## 2018-06-24 DIAGNOSIS — M5440 Lumbago with sciatica, unspecified side: Secondary | ICD-10-CM

## 2018-06-24 DIAGNOSIS — M4326 Fusion of spine, lumbar region: Secondary | ICD-10-CM | POA: Diagnosis not present

## 2018-06-24 NOTE — Patient Instructions (Signed)
Medication Instructions:   NO CHANGE  Labwork:  Your physician recommends that you return for lab work in: [place]:  Your physician recommends that you schedule a follow-up appointment in: [place] [address] [place]

## 2018-06-30 ENCOUNTER — Telehealth: Payer: Self-pay | Admitting: Cardiology

## 2018-06-30 NOTE — Telephone Encounter (Signed)
Medical records requested from [facility] - [facility] 06/26/18 vlm

## 2018-07-02 DIAGNOSIS — M5440 Lumbago with sciatica, unspecified side: Secondary | ICD-10-CM | POA: Diagnosis not present

## 2018-07-13 ENCOUNTER — Ambulatory Visit (INDEPENDENT_AMBULATORY_CARE_PROVIDER_SITE_OTHER): Payer: Medicare Other | Admitting: Family Medicine

## 2018-07-13 VITALS — BP 116/76 | HR 98 | Temp 98.4°F | Wt 311.0 lb

## 2018-07-13 DIAGNOSIS — R030 Elevated blood-pressure reading, without diagnosis of hypertension: Secondary | ICD-10-CM | POA: Diagnosis not present

## 2018-07-13 NOTE — Progress Notes (Signed)
Patient in today for BP check.  Patient reports he is taking his BP medicine. BP in the office today is 116/76 and pulse 75. Pt denies headaches, blurred vision, chest pain and shortness of breath. Pt is to continue with current medications, and to follow up with Dr. Madilyn Fireman in 4 months.

## 2018-07-13 NOTE — Progress Notes (Signed)
Agree with documentation as above.   Catherine Metheney, MD  

## 2018-07-30 ENCOUNTER — Other Ambulatory Visit: Payer: Self-pay | Admitting: Family Medicine

## 2018-07-30 DIAGNOSIS — I10 Essential (primary) hypertension: Secondary | ICD-10-CM | POA: Diagnosis not present

## 2018-07-31 ENCOUNTER — Encounter: Payer: Self-pay | Admitting: *Deleted

## 2018-07-31 LAB — BASIC METABOLIC PANEL
BUN/Creatinine Ratio: 18 (ref 9–20)
BUN: 14 mg/dL (ref 6–24)
CALCIUM: 9.5 mg/dL (ref 8.7–10.2)
CO2: 26 mmol/L (ref 20–29)
Chloride: 100 mmol/L (ref 96–106)
Creatinine, Ser: 0.8 mg/dL (ref 0.76–1.27)
GFR calc Af Amer: 115 mL/min/{1.73_m2} (ref >59)
GFR, EST NON AFRICAN AMERICAN: 100 mL/min/{1.73_m2} (ref >59)
Glucose: 130 mg/dL — ABNORMAL HIGH (ref 65–99)
POTASSIUM: 4 mmol/L (ref 3.5–5.2)
Sodium: 140 mmol/L (ref 134–144)

## 2018-08-03 ENCOUNTER — Telehealth: Payer: Self-pay | Admitting: *Deleted

## 2018-08-03 NOTE — Telephone Encounter (Signed)
Spoke with pt, dr Stanford Breed reviewed echo CD and patient made aware LVH is mild with mild prox septal thickening. No change in medications at this time.

## 2018-08-18 DIAGNOSIS — G4733 Obstructive sleep apnea (adult) (pediatric): Secondary | ICD-10-CM | POA: Diagnosis not present

## 2018-08-28 ENCOUNTER — Other Ambulatory Visit: Payer: Self-pay | Admitting: Family Medicine

## 2018-09-08 DIAGNOSIS — R062 Wheezing: Secondary | ICD-10-CM | POA: Diagnosis not present

## 2018-09-08 DIAGNOSIS — Z9989 Dependence on other enabling machines and devices: Secondary | ICD-10-CM | POA: Diagnosis not present

## 2018-09-08 DIAGNOSIS — R05 Cough: Secondary | ICD-10-CM | POA: Diagnosis not present

## 2018-09-08 DIAGNOSIS — G4733 Obstructive sleep apnea (adult) (pediatric): Secondary | ICD-10-CM | POA: Diagnosis not present

## 2018-09-22 DIAGNOSIS — G4733 Obstructive sleep apnea (adult) (pediatric): Secondary | ICD-10-CM | POA: Diagnosis not present

## 2018-09-23 DIAGNOSIS — R05 Cough: Secondary | ICD-10-CM | POA: Diagnosis not present

## 2018-09-23 DIAGNOSIS — Z9989 Dependence on other enabling machines and devices: Secondary | ICD-10-CM | POA: Diagnosis not present

## 2018-09-23 DIAGNOSIS — G4733 Obstructive sleep apnea (adult) (pediatric): Secondary | ICD-10-CM | POA: Diagnosis not present

## 2018-09-28 ENCOUNTER — Other Ambulatory Visit: Payer: Self-pay | Admitting: Family Medicine

## 2018-10-01 DIAGNOSIS — M542 Cervicalgia: Secondary | ICD-10-CM | POA: Diagnosis not present

## 2018-10-01 DIAGNOSIS — M5126 Other intervertebral disc displacement, lumbar region: Secondary | ICD-10-CM | POA: Diagnosis not present

## 2018-10-30 DIAGNOSIS — H538 Other visual disturbances: Secondary | ICD-10-CM | POA: Diagnosis not present

## 2018-10-30 DIAGNOSIS — R350 Frequency of micturition: Secondary | ICD-10-CM | POA: Insufficient documentation

## 2018-10-30 DIAGNOSIS — G35 Multiple sclerosis: Secondary | ICD-10-CM | POA: Diagnosis not present

## 2018-11-12 ENCOUNTER — Ambulatory Visit (INDEPENDENT_AMBULATORY_CARE_PROVIDER_SITE_OTHER): Payer: Medicare Other | Admitting: Family Medicine

## 2018-11-12 ENCOUNTER — Encounter: Payer: Self-pay | Admitting: Family Medicine

## 2018-11-12 VITALS — BP 128/77 | Temp 98.6°F | Ht 73.0 in | Wt 310.0 lb

## 2018-11-12 DIAGNOSIS — N528 Other male erectile dysfunction: Secondary | ICD-10-CM | POA: Diagnosis not present

## 2018-11-12 DIAGNOSIS — R05 Cough: Secondary | ICD-10-CM | POA: Diagnosis not present

## 2018-11-12 DIAGNOSIS — R490 Dysphonia: Secondary | ICD-10-CM | POA: Diagnosis not present

## 2018-11-12 DIAGNOSIS — R053 Chronic cough: Secondary | ICD-10-CM

## 2018-11-12 DIAGNOSIS — I10 Essential (primary) hypertension: Secondary | ICD-10-CM | POA: Insufficient documentation

## 2018-11-12 MED ORDER — VENTOLIN HFA 108 (90 BASE) MCG/ACT IN AERS: 1.0000 | INHALATION_SPRAY | Freq: Four times a day (QID) | RESPIRATORY_TRACT | 99 refills | Status: DC | PRN

## 2018-11-12 MED ORDER — TADALAFIL 20 MG PO TABS: 10.0000 mg | ORAL_TABLET | ORAL | 11 refills | Status: DC | PRN

## 2018-11-12 MED ORDER — LOSARTAN POTASSIUM-HCTZ 50-12.5 MG PO TABS: 1.0000 | ORAL_TABLET | Freq: Every day | ORAL | 0 refills | Status: DC

## 2018-11-12 MED ORDER — OMEPRAZOLE 20 MG PO CPDR: 20.0000 mg | DELAYED_RELEASE_CAPSULE | Freq: Every day | ORAL | 3 refills | Status: DC

## 2018-11-12 NOTE — Progress Notes (Signed)
Virtual Visit via Video Note  I connected with Mark [name]. on 11/12/18 at  2:00 PM EDT by a video enabled telemedicine application and verified that I am speaking with the correct person using two identifiers.   I discussed the limitations of evaluation and management by telemedicine and the availability of in person appointments. The patient expressed understanding and agreed to proceed.  Pt sitting in his car and I was in my home office.   Subjective:    CC: F/U BP  HPI: Hypertension- Pt denies chest pain, SOB, dizziness, or heart palpitations.  Taking meds as directed w/o problems.  Denies medication side effects.  Chronic cough  Since June of last year. No SOB. No fever.    Mostly dry.  Voice is raspy.   CT chest in 02/2018 was normal.  Not tried a PPI yet. He is on lisinopril. Denies andy GERD sxs.   Erective dysfunction - he tried Viagr years ago and did OK.  He would like to try to have sex with his wife again.  It has been years.  He is interested in Cialis.  He has never taken it befoe.    Past medical history, Surgical history, Family history not pertinant except as noted below, Social history, Allergies, and medications have been entered into the medical record, reviewed, and corrections made.   Review of Systems: No fevers, chills, night sweats, weight loss, chest pain, or shortness of breath.   Objective:    General: Speaking clearly in complete sentences without any shortness of breath.  Alert and oriented x3.  Normal judgment. No apparent acute distress.    Impression and Recommendations:    HTN -Well controlled. Continue current regimen. Follow up in  6 mo.    Chronic cough - will change off or ACi.Marland Kitchen Could be medication side effect from ACE inhibitor so we will switch him to an ARB.  They are warned that if it is coming from the ACE inhibitor it may take several weeks for it to completely resolve.  Will add PPI.  Explained that he also been having some  silent reflux which is causing a persistent chronic cough.  Also refer to ENT to have a quick scope in the office just to look at the vocal cords.  He also gets some voice change.  Thus I think they could see a lot they could probably tell if he is having some reflux is causing some inflammation in the cords or he may even have something else going on like polyps.  Erectile dysfunction-discussed options with patient.  He is very interested in trying Cialis.  Prescription sent to pharmacy.  Warned that it can be quite costly and is often not covered by health insurance.  If he has any problems filling the medication then let us know.  Also warned about potential side effects.  Stop immediately if any chest pain on the medicine.  I discussed the assessment and treatment plan with the patient. The patient was provided an opportunity to ask questions and all were answered. The patient agreed with the plan and demonstrated an understanding of the instructions.   The patient was advised to call back or seek an in-person evaluation if the symptoms worsen or if the condition fails to improve as anticipated.   Beatrice Lecher, MD

## 2018-11-12 NOTE — Progress Notes (Signed)
He is concerned about a cough and like he has to clear his throat a lot. Maryruth Eve, [place]ahoma Crocker, CMA

## 2018-11-16 ENCOUNTER — Telehealth: Payer: Self-pay | Admitting: Cardiology

## 2018-11-16 NOTE — Progress Notes (Signed)
Virtual Visit via Video Note   This visit type was conducted due to national recommendations for restrictions regarding the COVID-19 Pandemic (e.g. social distancing) in an effort to limit this patient's exposure and mitigate transmission in our community.  Due to his co-morbid illnesses, this patient is at least at moderate risk for complications without adequate follow up.  This format is felt to be most appropriate for this patient at this time.  All issues noted in this document were discussed and addressed.  A limited physical exam was performed with this format.  Please refer to the patient's chart for his consent to telehealth for [facility].   Evaluation Performed:  Follow-up visit  Date:  11/18/2018   ID:  Mark Spitz., DOB February 06, 1961, MRN [id number]  Patient Location: Home Provider Location: Home  PCP:  Hali Marry, MD  Cardiologist:  Dr Stanford Breed  Chief Complaint:  Hypertension and abnormal echo  History of Present Illness:    FU hypertension and abnormal echocardiogram at request of Beatrice Lecher MD.  Echocardiogram October 2019 showed normal LV function and severe left ventricular hypertrophy.  Plan at previous office visit was to control blood pressure and repeat echocardiogram in the future.  I also reviewed previous outside echocardiogram and felt left ventricular hypertrophy was mild with mild proximal septal thickening.  Since last seen, patient denies dyspnea, chest pain, palpitations or syncope.  The patient does not have symptoms concerning for COVID-19 infection (fever, chills, cough, or new shortness of breath).    Past Medical History:  Diagnosis Date  . Anxiety    chronic pain treated with narcotics  . Arthritis   . Chronic pain   . Constipation due to opioid therapy   . Depression   . Dysphonia    thoracic left sided"impingement" with heminumbness  . GI bleed    abt 15 years ago  . Morbid obesity ([place])   . MS (multiple  sclerosis) ([place]) 02/11/2013   [name]  . Pneumonia   . Sleep apnea with use of continuous positive airway pressure (CPAP) 02/11/2013   07-13-12 AHi of 98.6 /hr titrated to 11 cm water ,  average user time 4 hours and 4 minutes. Residual AHI 1.20 December 2012 .  Marland Kitchen Umbilical hernia    Past Surgical History:  Procedure Laterality Date  . CARPAL TUNNEL RELEASE Left    [name]  . COLONOSCOPY W/ POLYPECTOMY    . POSTERIOR LUMBAR FUSION  04/17/15   [name], also removed old hardware  . SPINE SURGERY     total of six vertebras fused, three lumbar surgeries and one anterior neck fusion  . TONSILLECTOMY       Current Meds  Medication Sig  . amphetamine-dextroamphetamine (ADDERALL XR) 30 MG 24 hr capsule Take 30 mg by mouth.  . DULoxetine (CYMBALTA) 60 MG capsule Take 60 mg by mouth 2 (two) times daily.   . fluticasone furoate-vilanterol (BREO ELLIPTA) 100-25 MCG/INH AEPB Inhale 1 puff into the lungs daily.  Marland Kitchen gabapentin (NEURONTIN) 800 MG tablet Take 800 mg by mouth 4 (four) times daily.  Marland Kitchen HYDROcodone-acetaminophen (NORCO) 10-325 MG per tablet Take 2 tablets by mouth every 4 (four) hours as needed for severe pain.   Marland Kitchen ibuprofen (ADVIL,MOTRIN) 800 MG tablet Take 800 mg by mouth every 8 (eight) hours as needed for moderate pain.  Marland Kitchen loratadine (CLARITIN) 10 MG tablet Take 10 mg by mouth at bedtime.  Marland Kitchen losartan-hydrochlorothiazide (HYZAAR) 50-12.5 MG tablet  Take 1 tablet by mouth daily.  Marland Kitchen omeprazole (PRILOSEC) 20 MG capsule Take 1 capsule (20 mg total) by mouth daily.  . tadalafil (ADCIRCA/CIALIS) 20 MG tablet Take 0.5-1 tablets (10-20 mg total) by mouth every other day as needed for erectile dysfunction.  . tamsulosin (FLOMAX) 0.4 MG CAPS capsule Take 0.4 mg by mouth at bedtime.   . Teriflunomide (AUBAGIO) 14 MG TABS Take 14 mg by mouth at bedtime. Dr Rae Mar  . tiZANidine (ZANAFLEX) 4 MG tablet Take 4 mg by mouth every 6 (six) hours as needed.   . VENTOLIN HFA 108 (90 Base)  MCG/ACT inhaler Inhale 1 puff into the lungs every 6 (six) hours as needed for wheezing or shortness of breath.     Allergies:   Patient has no known allergies.   Social History   Tobacco Use  . Smoking status: Never Smoker  . Smokeless tobacco: Never Used  Substance Use Topics  . Alcohol use: Yes    Comment: occasionally  . Drug use: No     Family Hx: The patient's family history includes Arrhythmia in his mother; Cancer in some other family members; Sleep walking in his son.  ROS:   Please see the history of present illness.    No fevers, chills or productive cough.  Some wheeze and nonproductive cough All other systems reviewed and are negative.   Recent Labs: 03/09/2018: ALT 19; Hemoglobin 12.7; Platelets 268; TSH 0.33 07/30/2018: BUN 14; Creatinine, Ser 0.80; Potassium 4.0; Sodium 140   Recent Lipid Panel Lab Results  Component Value Date/Time   CHOL 140 01/04/2015 11:41 AM   TRIG 93 01/04/2015 11:41 AM   HDL 43 01/04/2015 11:41 AM   CHOLHDL 3.3 01/04/2015 11:41 AM   LDLCALC 78 01/04/2015 11:41 AM    Wt Readings from Last 3 Encounters:  11/18/18 (!) 310 lb (140.6 kg)  11/12/18 (!) 310 lb (140.6 kg)  07/13/18 (!) 311 lb (141.1 kg)     Objective:    Vital Signs:  Ht 6\' 1"  (1.854 m)   Wt (!) 310 lb (140.6 kg)   BMI 40.90 kg/m    VITAL SIGNS:  reviewed  Patient answers questions appropriately. Normal affect. Remainder of physical examination not performed (telehealth visit; coronavirus pandemic)  ASSESSMENT & PLAN:    1. Hypertension-patient's blood pressure is controlled on present medical regimen.  We will continue.  Note his lisinopril was recently changed to ARB because of wheeze and cough.  He will also continue lifestyle modification including low-sodium diet, exercise and weight loss. 2. Left ventricular hypertrophy-I personally reviewed patient's outside echocardiogram and felt left ventricular hypertrophy was mild with mild proximal septal  thickening.  We will plan to repeat echocardiogram in the future. 3. Morbid obesity-we discussed the importance of diet, exercise and weight loss. 4. Obstructive sleep apnea-we again discussed the importance of compliance with CPAP.  COVID-19 Education: The importance of social distancing was discussed today.  Time:   Today, I have spent 15 minutes with the patient with telehealth technology discussing the above problems.     Medication Adjustments/Labs and Tests Ordered: Current medicines are reviewed at length with the patient today.  Concerns regarding medicines are outlined above.   Tests Ordered: No orders of the defined types were placed in this encounter.   Medication Changes: No orders of the defined types were placed in this encounter.   Disposition:  Follow up in 6 month(s)  Signed, Kirk Ruths, MD  11/18/2018 9:56 AM  [facility]

## 2018-11-16 NOTE — Telephone Encounter (Signed)
Virtual Visit Pre-Appointment Phone Call  "(Name), I am calling you today to discuss your upcoming appointment. We are currently trying to limit exposure to the virus that causes COVID-19 by seeing patients at home rather than in the office."  1. "What is the BEST phone number to call the day of the visit?" - include this in appointment notes  2. Do you have or have access to (through a family member/friend) a smartphone with video capability that we can use for your visit?" a. If yes - list this number in appt notes as cell (if different from BEST phone #) and list the appointment type as a VIDEO visit in appointment notes b. If no - list the appointment type as a PHONE visit in appointment notes  3. Confirm consent - "In the setting of the current Covid19 crisis, you are scheduled for a (phone or video) visit with your provider on (date) at (time).  Just as we do with many in-office visits, in order for you to participate in this visit, we must obtain consent.  If you'd like, I can send this to your mychart (if signed up) or email for you to review.  Otherwise, I can obtain your verbal consent now.  All virtual visits are billed to your insurance company just like a normal visit would be.  By agreeing to a virtual visit, we'd like you to understand that the technology does not allow for your provider to perform an examination, and thus may limit your provider's ability to fully assess your condition. If your provider identifies any concerns that need to be evaluated in person, we will make arrangements to do so.  Finally, though the technology is pretty good, we cannot assure that it will always work on either your or our end, and in the setting of a video visit, we may have to convert it to a phone-only visit.  In either situation, we cannot ensure that we have a secure connection.  Are you willing to proceed?" STAFF: Did the patient verbally acknowledge consent to telehealth visit? Document  YES/NO here: Yes  4. Advise patient to be prepared - "Two hours prior to your appointment, go ahead and check your blood pressure, pulse, oxygen saturation, and your weight (if you have the equipment to check those) and write them all down. When your visit starts, your provider will ask you for this information. If you have an Apple Watch or Kardia device, please plan to have heart rate information ready on the day of your appointment. Please have a pen and paper handy nearby the day of the visit as well."  5. Give patient instructions for MyChart download to smartphone OR Doximity/Doxy.me as below if video visit (depending on what platform provider is using)  6. Inform patient they will receive a phone call 15 minutes prior to their appointment time (may be from unknown caller ID) so they should be prepared to answer    [place]. has been deemed a candidate for a follow-up tele-health visit to limit community exposure during the Covid-19 pandemic. I spoke with the patient via phone to ensure availability of phone/video source, confirm preferred email & phone number, and discuss instructions and expectations.  I reminded Mark [name]. to be prepared with any vital sign and/or heart rhythm information that could potentially be obtained via home monitoring, at the time of his visit. I reminded Mark [name]. to expect a phone  call prior to his visit.  Calla Kicks 11/16/2018 4:06 PM    FULL LENGTH CONSENT FOR TELE-HEALTH VISIT   I hereby voluntarily request, consent and authorize [facility] and its employed or contracted physicians, physician assistants, nurse practitioners or other licensed health care professionals (the Practitioner), to provide me with telemedicine health care services (the Services") as deemed necessary by the treating Practitioner. I acknowledge and consent to receive the Services by the Practitioner via  telemedicine. I understand that the telemedicine visit will involve communicating with the Practitioner through live audiovisual communication technology and the disclosure of certain medical information by electronic transmission. I acknowledge that I have been given the opportunity to request an in-person assessment or other available alternative prior to the telemedicine visit and am voluntarily participating in the telemedicine visit.  I understand that I have the right to withhold or withdraw my consent to the use of telemedicine in the course of my care at any time, without affecting my right to future care or treatment, and that the Practitioner or I may terminate the telemedicine visit at any time. I understand that I have the right to inspect all information obtained and/or recorded in the course of the telemedicine visit and may receive copies of available information for a reasonable fee.  I understand that some of the potential risks of receiving the Services via telemedicine include:   Delay or interruption in medical evaluation due to technological equipment failure or disruption;  Information transmitted may not be sufficient (e.g. poor resolution of images) to allow for appropriate medical decision making by the Practitioner; and/or   In rare instances, security protocols could fail, causing a breach of personal health information.  Furthermore, I acknowledge that it is my responsibility to provide information about my medical history, conditions and care that is complete and accurate to the best of my ability. I acknowledge that Practitioner's advice, recommendations, and/or decision may be based on factors not within their control, such as incomplete or inaccurate data provided by me or distortions of diagnostic images or specimens that may result from electronic transmissions. I understand that the practice of medicine is not an exact science and that Practitioner makes no warranties or  guarantees regarding treatment outcomes. I acknowledge that I will receive a copy of this consent concurrently upon execution via email to the email address I last provided but may also request a printed copy by calling the office of [facility].    I understand that my insurance will be billed for this visit.   I have read or had this consent read to me.  I understand the contents of this consent, which adequately explains the benefits and risks of the Services being provided via telemedicine.   I have been provided ample opportunity to ask questions regarding this consent and the Services and have had my questions answered to my satisfaction.  I give my informed consent for the services to be provided through the use of telemedicine in my medical care  By participating in this telemedicine visit I agree to the above.

## 2018-11-18 ENCOUNTER — Other Ambulatory Visit: Payer: Self-pay

## 2018-11-18 ENCOUNTER — Telehealth (INDEPENDENT_AMBULATORY_CARE_PROVIDER_SITE_OTHER): Payer: Medicare Other | Admitting: Cardiology

## 2018-11-18 VITALS — Ht 73.0 in | Wt 310.0 lb

## 2018-11-18 DIAGNOSIS — I119 Hypertensive heart disease without heart failure: Secondary | ICD-10-CM

## 2018-11-18 DIAGNOSIS — G4733 Obstructive sleep apnea (adult) (pediatric): Secondary | ICD-10-CM

## 2018-11-18 DIAGNOSIS — Z9989 Dependence on other enabling machines and devices: Secondary | ICD-10-CM

## 2018-11-18 DIAGNOSIS — I10 Essential (primary) hypertension: Secondary | ICD-10-CM

## 2018-11-18 DIAGNOSIS — R931 Abnormal findings on diagnostic imaging of heart and coronary circulation: Secondary | ICD-10-CM

## 2018-11-18 NOTE — Patient Instructions (Signed)

## 2018-11-19 DIAGNOSIS — G4733 Obstructive sleep apnea (adult) (pediatric): Secondary | ICD-10-CM | POA: Diagnosis not present

## 2018-11-19 DIAGNOSIS — R062 Wheezing: Secondary | ICD-10-CM | POA: Diagnosis not present

## 2018-12-23 DIAGNOSIS — R0602 Shortness of breath: Secondary | ICD-10-CM | POA: Diagnosis not present

## 2018-12-23 DIAGNOSIS — R05 Cough: Secondary | ICD-10-CM | POA: Diagnosis not present

## 2018-12-23 DIAGNOSIS — G4733 Obstructive sleep apnea (adult) (pediatric): Secondary | ICD-10-CM | POA: Diagnosis not present

## 2018-12-23 DIAGNOSIS — J984 Other disorders of lung: Secondary | ICD-10-CM | POA: Diagnosis not present

## 2018-12-29 DIAGNOSIS — K117 Disturbances of salivary secretion: Secondary | ICD-10-CM | POA: Diagnosis not present

## 2018-12-29 DIAGNOSIS — G4733 Obstructive sleep apnea (adult) (pediatric): Secondary | ICD-10-CM | POA: Diagnosis not present

## 2018-12-29 DIAGNOSIS — R490 Dysphonia: Secondary | ICD-10-CM | POA: Diagnosis not present

## 2018-12-29 DIAGNOSIS — R05 Cough: Secondary | ICD-10-CM | POA: Diagnosis not present

## 2019-01-04 DIAGNOSIS — G35 Multiple sclerosis: Secondary | ICD-10-CM | POA: Diagnosis not present

## 2019-01-04 DIAGNOSIS — Z5181 Encounter for therapeutic drug level monitoring: Secondary | ICD-10-CM | POA: Diagnosis not present

## 2019-01-04 DIAGNOSIS — G8381 Brown-Sequard syndrome: Secondary | ICD-10-CM | POA: Diagnosis not present

## 2019-01-04 DIAGNOSIS — Z79899 Other long term (current) drug therapy: Secondary | ICD-10-CM | POA: Diagnosis not present

## 2019-01-04 DIAGNOSIS — G629 Polyneuropathy, unspecified: Secondary | ICD-10-CM | POA: Diagnosis not present

## 2019-01-07 DIAGNOSIS — R1312 Dysphagia, oropharyngeal phase: Secondary | ICD-10-CM | POA: Diagnosis not present

## 2019-01-28 ENCOUNTER — Other Ambulatory Visit: Payer: Self-pay | Admitting: Neurosurgery

## 2019-01-28 DIAGNOSIS — R1312 Dysphagia, oropharyngeal phase: Secondary | ICD-10-CM

## 2019-02-01 DIAGNOSIS — K5730 Diverticulosis of large intestine without perforation or abscess without bleeding: Secondary | ICD-10-CM | POA: Diagnosis not present

## 2019-02-01 DIAGNOSIS — D124 Benign neoplasm of descending colon: Secondary | ICD-10-CM | POA: Diagnosis not present

## 2019-02-01 DIAGNOSIS — K640 First degree hemorrhoids: Secondary | ICD-10-CM | POA: Diagnosis not present

## 2019-02-01 DIAGNOSIS — D126 Benign neoplasm of colon, unspecified: Secondary | ICD-10-CM | POA: Diagnosis not present

## 2019-02-01 DIAGNOSIS — K6289 Other specified diseases of anus and rectum: Secondary | ICD-10-CM | POA: Diagnosis not present

## 2019-02-01 DIAGNOSIS — Z86010 Personal history of colonic polyps: Secondary | ICD-10-CM | POA: Diagnosis not present

## 2019-02-01 DIAGNOSIS — K648 Other hemorrhoids: Secondary | ICD-10-CM | POA: Diagnosis not present

## 2019-02-01 DIAGNOSIS — K6389 Other specified diseases of intestine: Secondary | ICD-10-CM | POA: Diagnosis not present

## 2019-02-01 DIAGNOSIS — D128 Benign neoplasm of rectum: Secondary | ICD-10-CM | POA: Diagnosis not present

## 2019-02-09 ENCOUNTER — Ambulatory Visit
Admission: RE | Admit: 2019-02-09 | Discharge: 2019-02-09 | Disposition: A | Discharge status: Home / Self Care | Payer: Medicare Other | Source: Ambulatory Visit | Attending: Neurosurgery | Admitting: Neurosurgery

## 2019-02-09 DIAGNOSIS — R05 Cough: Secondary | ICD-10-CM | POA: Diagnosis not present

## 2019-02-09 DIAGNOSIS — R1312 Dysphagia, oropharyngeal phase: Secondary | ICD-10-CM

## 2019-02-09 DIAGNOSIS — M542 Cervicalgia: Secondary | ICD-10-CM | POA: Diagnosis not present

## 2019-02-09 MED ORDER — IOPAMIDOL (ISOVUE-300) INJECTION 61%
75.0000 mL | Freq: Once | INTRAVENOUS | Status: AC | PRN
  Administered 2019-02-09: 75 mL via INTRAVENOUS

## 2019-02-16 ENCOUNTER — Other Ambulatory Visit: Payer: Self-pay | Admitting: Family Medicine

## 2019-02-16 DIAGNOSIS — M5440 Lumbago with sciatica, unspecified side: Secondary | ICD-10-CM | POA: Diagnosis not present

## 2019-02-16 DIAGNOSIS — R1312 Dysphagia, oropharyngeal phase: Secondary | ICD-10-CM | POA: Diagnosis not present

## 2019-04-18 DIAGNOSIS — Z20828 Contact with and (suspected) exposure to other viral communicable diseases: Secondary | ICD-10-CM | POA: Diagnosis not present

## 2019-05-18 DIAGNOSIS — M542 Cervicalgia: Secondary | ICD-10-CM | POA: Diagnosis not present

## 2019-05-18 DIAGNOSIS — G35 Multiple sclerosis: Secondary | ICD-10-CM | POA: Diagnosis not present

## 2019-06-10 ENCOUNTER — Other Ambulatory Visit: Payer: Self-pay | Admitting: Neurosurgery

## 2019-06-10 DIAGNOSIS — M542 Cervicalgia: Secondary | ICD-10-CM

## 2019-06-14 DIAGNOSIS — R062 Wheezing: Secondary | ICD-10-CM | POA: Diagnosis not present

## 2019-06-16 NOTE — Progress Notes (Signed)
Subjective:   Mark [name]. is a 58 y.o. male who presents for an Initial Medicare Annual Wellness Visit.  Review of Systems  No ROS.  Medicare Wellness Virtual Visit.  Visual/audio telehealth visit, UTA vital signs.   See social history for additional risk factors.    Cardiac Risk Factors include: advanced age (>89men, >71 women);sedentary lifestyle;hypertension;male gender Sleep patterns: Getting 6-7 hours of sleep a night. Wakes up 1-2 times a night to void. Wakes up and feels sluggish and does not want to get up. Home Safety/Smoke Alarms: Feels safe in home. Smoke alarms in place.  Living environment;Lives with wife in 1 story home and stairs have handrails and a ramp is in place as well. Shower is a walk in shower and no grab bars in it. Lives in [place]. Seat Belt Safety/Bike Helmet: Wears seat belt.    Male:   [facility]- Patient will call his GI and schedule Colonoscopy.    PSA- No results found for: PSA      Objective:    Today's Vitals   06/21/19 1100  PainSc: 6    There is no height or weight on file to calculate BMI.  Advanced Directives 06/21/2019 05/18/2016 05/14/2016 04/05/2015 12/13/2014 08/22/2014  Does Patient Have a Medical Advance Directive? Yes Yes Yes Yes Yes Yes  Type of Paramedic of [place];Living will [place];Living will [place];Living will [place];Living will Healthcare Power of [place]  Does patient want to make changes to medical advance directive? No - Patient declined No - Patient declined - No - Patient declined No - Patient declined -  Copy of [place] in Chart? No - copy requested No - copy requested No - copy requested No - copy requested - -    Current Medications (verified) Outpatient Encounter Medications as of 06/21/2019  Medication Sig  . amphetamine-dextroamphetamine (ADDERALL XR) 30 MG  24 hr capsule Take 30 mg by mouth.  . DULoxetine (CYMBALTA) 60 MG capsule Take 60 mg by mouth 2 (two) times daily.   . fluticasone furoate-vilanterol (BREO ELLIPTA) 100-25 MCG/INH AEPB Inhale 1 puff into the lungs daily.  Marland Kitchen gabapentin (NEURONTIN) 800 MG tablet Take 800 mg by mouth 4 (four) times daily.  Marland Kitchen HYDROcodone-acetaminophen (NORCO) 10-325 MG per tablet Take 2 tablets by mouth every 4 (four) hours as needed for severe pain.   Marland Kitchen ibuprofen (ADVIL,MOTRIN) 800 MG tablet Take 800 mg by mouth every 8 (eight) hours as needed for moderate pain.  Marland Kitchen loratadine (CLARITIN) 10 MG tablet Take 10 mg by mouth at bedtime.  Marland Kitchen losartan-hydrochlorothiazide (HYZAAR) 50-12.5 MG tablet TAKE ONE TABLET BY MOUTH EVERY DAY  . tamsulosin (FLOMAX) 0.4 MG CAPS capsule Take 0.4 mg by mouth at bedtime.   . Teriflunomide (AUBAGIO) 14 MG TABS Take 14 mg by mouth at bedtime. Dr Rae Mar  . VENTOLIN HFA 108 (90 Base) MCG/ACT inhaler Inhale 1 puff into the lungs every 6 (six) hours as needed for wheezing or shortness of breath.  Marland Kitchen omeprazole (PRILOSEC) 20 MG capsule Take 1 capsule (20 mg total) by mouth daily. (Patient not taking: Reported on 06/21/2019)  . tadalafil (ADCIRCA/CIALIS) 20 MG tablet Take 0.5-1 tablets (10-20 mg total) by mouth every other day as needed for erectile dysfunction. (Patient not taking: Reported on 06/21/2019)  . tiZANidine (ZANAFLEX) 4 MG tablet Take 4 mg by mouth every 6 (six) hours as needed.  No facility-administered encounter medications on file as of 06/21/2019.     Allergies (verified) Patient has no known allergies.   History: Past Medical History:  Diagnosis Date  . Anxiety    chronic pain treated with narcotics  . Arthritis   . Chronic pain   . Constipation due to opioid therapy   . Depression   . Dysphonia    thoracic left sided"impingement" with heminumbness  . GI bleed    abt 15 years ago  . Morbid obesity ([place])   . MS (multiple sclerosis) ([place]) 02/11/2013   ABAGIO   Per. Dr Bjorn Loser  . Pneumonia   . Sleep apnea with use of continuous positive airway pressure (CPAP) 02/11/2013   07-13-12 AHi of 98.6 /hr titrated to 11 cm water ,  average user time 4 hours and 4 minutes. Residual AHI 1.20 December 2012 .  Marland Kitchen Umbilical hernia    Past Surgical History:  Procedure Laterality Date  . CARPAL TUNNEL RELEASE Left    [name]  . COLONOSCOPY W/ POLYPECTOMY    . POSTERIOR LUMBAR FUSION  04/17/15   [name], also removed old hardware  . SPINE SURGERY     total of six vertebras fused, three lumbar surgeries and one anterior neck fusion  . TONSILLECTOMY     Family History  Problem Relation Age of Onset  . Arrhythmia Mother   . Cancer Other   . Cancer Other   . Sleep walking Son    Social History   Socioeconomic History  . Marital status: Married    Spouse name: [name]  . Number of children: 2  . Years of education: 12+  . Highest education level: Some college, no degree  Occupational History  . 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 Financial resource strain: Not hard at all  . Food insecurity    Worry: Never true    Inability: Never true  . Transportation needs    Medical: No    Non-medical: No  Tobacco Use  . Smoking status: Never Smoker  . Smokeless tobacco: Never Used  Substance and Sexual Activity  . Alcohol use: Yes    Comment: occasionally  . Drug use: No  . Sexual activity: Not Currently  Lifestyle  . Physical activity    Days per week: 0 days    Minutes per session: 0 min  . Stress: Not at all  Relationships  . Social connections    Talks on phone: More than three times a week    Gets together: Once a week    Attends religious service: More than 4 times per year    Active member of club or organization: No    Attends meetings of clubs or organizations: Never    Relationship status: Married  Other Topics Concern  . Not on file  Social History Narrative       Patient is married [name]) and lives at  home with his wife, daughter, son-in-law and grandchild.   Patient has two adult children.   Patient is right-handed.   Patient is disabled.   Patient has a college education.   Patient drinks a gallon of tea daily.      Tobacco Counseling Counseling given: Not Answered   Clinical Intake:  Pre-visit preparation completed: Yes  Pain : 0-10 Pain Score: 6  Pain Type: Chronic pain Pain Location: Back Pain Orientation: Lower Pain Descriptors / Indicators: Aching, Throbbing Pain Onset: More than a month  ago Pain Frequency: Intermittent Pain Relieving Factors: pain meds  Pain Relieving Factors: pain meds  Nutritional Risks: None Diabetes: No  How often do you need to have someone help you when you read instructions, pamphlets, or other written materials from your doctor or pharmacy?: 1 - Never What is the last grade level you completed in school?: 15  Interpreter Needed?: No  Information entered by :: Orlie Dakin, LPN  Activities of Daily Living In your present state of health, do you have any difficulty performing the following activities: 06/21/2019  Hearing? Y  Comment has noticed he has hearing loss. Dog ate one of his hearing aids.  Vision? N  Difficulty concentrating or making decisions? N  Walking or climbing stairs? N  Dressing or bathing? N  Doing errands, shopping? N  Preparing Food and eating ? N  Using the Toilet? N  In the past six months, have you accidently leaked urine? N  Do you have problems with loss of bowel control? N  Managing your Medications? N  Managing your Finances? N  Housekeeping or managing your Housekeeping? N  Some recent data might be hidden     Immunizations and Health Maintenance Immunization History  Administered Date(s) Administered  . Tdap 01/04/2015   Health Maintenance Due  Topic Date Due  . HIV Screening  01/17/1976  . COLONOSCOPY  11/02/2018  . INFLUENZA VACCINE  02/20/2019    Patient Care Team: Hali Marry, MD as PCP - General (Family Medicine) [name], MD as Consulting Physician (Neurosurgery) Dr. Hyman Bower (Neurology) Ollen Bowl, MD (Urology) Dohmeier, Asencion Partridge, MD as Consulting Physician (Neurology)  Indicate any recent Medical Services you may have received from other than [facility] providers in the past year (date may be approximate).    Assessment:   This is a routine wellness examination for Mark [name].Physical assessment deferred to PCP.   Hearing/Vision screen  Hearing Screening   125Hz  250Hz  500Hz  1000Hz  2000Hz  3000Hz  4000Hz  6000Hz  8000Hz   Right ear:           Left ear:           Comments: Hearing test not done, visit done via telephone due to [place] pandemic  Vision Screening Comments: Vision test not done, visit done via telephone due to [place] pandemic.  Dietary issues and exercise activities discussed: Current Exercise Habits: The patient does not participate in regular exercise at present, Exercise limited by: neurologic condition(s);orthopedic condition(s) Diet Eats fairly healthy. Eats salads but no fruits and no vegetables never has. Breakfast: Cereal Lunch: sandwich Dinner:   Meat and starch    Goals    . Patient Stated     Patient would like to cut back on his sweet tea drinking a day. Drinks a half gallon of sweet tea daily.      Depression Screen PHQ 2/9 Scores 06/21/2019 11/12/2018 01/15/2018  PHQ - 2 Score 0 0 4  PHQ- 9 Score - - 10    Fall Risk Fall Risk  06/21/2019 12/11/2016  Falls in the past year? 1 Yes  Number falls in past yr: 1 2 or more  Injury with Fall? 0 No  Risk for fall due to : Impaired balance/gait -  Risk for fall due to: Comment Has MS and lower back pain -  Follow up Falls prevention discussed -    Is the patient's home free of loose throw rugs in walkways, pet beds, electrical cords, etc?   yes      Grab bars in  the bathroom? no      Handrails on the stairs?   yes      Adequate lighting?   no   Cognitive  Function:     6CIT Screen 06/21/2019  What Year? 0 points  What month? 0 points  What time? 0 points  Count back from 20 0 points  Months in reverse 0 points  Repeat phrase 0 points  Total Score 0    Screening Tests Health Maintenance  Topic Date Due  . HIV Screening  01/17/1976  . COLONOSCOPY  11/02/2018  . INFLUENZA VACCINE  02/20/2019  . TETANUS/TDAP  01/03/2025  . Hepatitis C Screening  Completed        Plan:    Please schedule your next medicare wellness visit with me in 1 yr.  Mr. Hoh , Thank you for taking time to come for your Medicare Wellness Visit. I appreciate your ongoing commitment to your health goals. Please review the following plan we discussed and let me know if I can assist you in the future.   These are the goals we discussed: Goals    . Patient Stated     Patient would like to cut back on his sweet tea drinking a day. Drinks a half gallon of sweet tea daily.       This is a list of the screening recommended for you and due dates:  Health Maintenance  Topic Date Due  . HIV Screening  01[birth year]17/1976  . Colon Cancer Screening  11/02/2018  . Flu Shot  02/20/2019  . Tetanus Vaccine  01/03/2025  .  Hepatitis C: One time screening is recommended by Center for Disease Control  (CDC) for  adults born from [birth year] through 1964.   Completed     I have personally reviewed and noted the following in the patient's chart:   . Medical and social history . Use of alcohol, tobacco or illicit drugs  . Current medications and supplements . Functional ability and status . Nutritional status . Physical activity . Advanced directives . List of other physicians . Hospitalizations, surgeries, and ER visits in previous 12 months . Vitals . Screenings to include cognitive, depression, and falls . Referrals and appointments  In addition, I have reviewed and discussed with patient certain preventive protocols, quality metrics, and best practice  recommendations. A written personalized care plan for preventive services as well as general preventive health recommendations were provided to patient.     Joanne Chars, LPN   [id number]

## 2019-06-21 ENCOUNTER — Ambulatory Visit (INDEPENDENT_AMBULATORY_CARE_PROVIDER_SITE_OTHER): Payer: Medicare Other | Admitting: *Deleted

## 2019-06-21 VITALS — Ht 73.0 in | Wt 300.0 lb

## 2019-06-21 DIAGNOSIS — Z0000 Encounter for general adult medical examination without abnormal findings: Secondary | ICD-10-CM | POA: Diagnosis not present

## 2019-06-21 NOTE — Patient Instructions (Signed)
Please schedule your next medicare wellness visit with me in 1 yr.  Mark [name] , Thank you for taking time to come for your Medicare Wellness Visit. I appreciate your ongoing commitment to your health goals. Please review the following plan we discussed and let me know if I can assist you in the future.   These are the goals we discussed: Goals    . Patient Stated     Patient would like to cut back on his sweet tea drinking a day. Drinks a half gallon of sweet tea daily.

## 2019-06-23 DIAGNOSIS — R350 Frequency of micturition: Secondary | ICD-10-CM | POA: Diagnosis not present

## 2019-06-24 DIAGNOSIS — J45909 Unspecified asthma, uncomplicated: Secondary | ICD-10-CM | POA: Diagnosis not present

## 2019-06-24 DIAGNOSIS — G4733 Obstructive sleep apnea (adult) (pediatric): Secondary | ICD-10-CM | POA: Diagnosis not present

## 2019-06-24 DIAGNOSIS — R0602 Shortness of breath: Secondary | ICD-10-CM | POA: Diagnosis not present

## 2019-07-02 NOTE — Progress Notes (Signed)
HPI: FU hypertensionand abnormal echocardiogramat request of Beatrice Lecher MD. Echocardiogram October 2019 showed normal LV function and severe left ventricular hypertrophy.  Plan at previous office visit was to control blood pressure and repeat echocardiogram in the future.  I also reviewed previous outside echocardiogram and felt left ventricular hypertrophy was mild with mild proximal septal thickening.  Since last seen,  patient has dyspnea on exertion and mild pedal edema unchanged.  He denies chest pain, palpitations or syncope.  Current Outpatient Medications  Medication Sig Dispense Refill  . amphetamine-dextroamphetamine (ADDERALL XR) 30 MG 24 hr capsule Take 30 mg by mouth.    . DULoxetine (CYMBALTA) 60 MG capsule Take 60 mg by mouth 2 (two) times daily.     . fluticasone furoate-vilanterol (BREO ELLIPTA) 100-25 MCG/INH AEPB Inhale 1 puff into the lungs daily.    Marland Kitchen gabapentin (NEURONTIN) 800 MG tablet Take 800 mg by mouth 4 (four) times daily.  11  . HYDROcodone-acetaminophen (NORCO) 10-325 MG per tablet Take 2 tablets by mouth every 4 (four) hours as needed for severe pain.     Marland Kitchen ibuprofen (ADVIL,MOTRIN) 800 MG tablet Take 800 mg by mouth every 8 (eight) hours as needed for moderate pain.    Marland Kitchen loratadine (CLARITIN) 10 MG tablet Take 10 mg by mouth at bedtime.    Marland Kitchen losartan-hydrochlorothiazide (HYZAAR) 50-12.5 MG tablet TAKE ONE TABLET BY MOUTH EVERY DAY 90 tablet 1  . tamsulosin (FLOMAX) 0.4 MG CAPS capsule Take 0.4 mg by mouth at bedtime.     . Teriflunomide (AUBAGIO) 14 MG TABS Take 14 mg by mouth at bedtime. Dr Rae Mar    . tiZANidine (ZANAFLEX) 4 MG tablet Take 4 mg by mouth every 6 (six) hours as needed.     . VENTOLIN HFA 108 (90 Base) MCG/ACT inhaler Inhale 1 puff into the lungs every 6 (six) hours as needed for wheezing or shortness of breath. 18 g prn   No current facility-administered medications for this visit.     Past Medical History:  Diagnosis  Date  . Anxiety    chronic pain treated with narcotics  . Arthritis   . Chronic pain   . Constipation due to opioid therapy   . Depression   . Dysphonia    thoracic left sided"impingement" with heminumbness  . GI bleed    abt 15 years ago  . Morbid obesity ([place])   . MS (multiple sclerosis) ([place]) 02/11/2013   [name]  . Pneumonia   . Sleep apnea with use of continuous positive airway pressure (CPAP) 02/11/2013   07-13-12 AHi of 98.6 /hr titrated to 11 cm water ,  average user time 4 hours and 4 minutes. Residual AHI 1.20 December 2012 .  Marland Kitchen Umbilical hernia     Past Surgical History:  Procedure Laterality Date  . CARPAL TUNNEL RELEASE Left    [name]  . COLONOSCOPY W/ POLYPECTOMY    . POSTERIOR LUMBAR FUSION  04/17/15   [name], also removed old hardware  . SPINE SURGERY     total of six vertebras fused, three lumbar surgeries and one anterior neck fusion  . TONSILLECTOMY      Social History   Socioeconomic History  . Marital status: Married    Spouse name: [name]  . Number of children: 2  . Years of education: 12+  . Highest education level: Some college, no degree  Occupational History  . 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 Smoking status: Never Smoker  . Smokeless tobacco: Never Used  Substance and Sexual Activity  . Alcohol use: Yes    Comment: occasionally  . Drug use: No  . Sexual activity: Not Currently  Other Topics Concern  . Not on file  Social History Narrative       Patient is married [name]) and lives at home with his wife, daughter, son-in-law and grandchild.   Patient has two adult children.   Patient is right-handed.   Patient is disabled.   Patient has a college education.   Patient drinks a gallon of tea daily.      Social Determinants of Health   Financial Resource Strain: Low Risk   . Difficulty of Paying Living Expenses: Not hard at all  Food Insecurity: No Food Insecurity  . Worried About  Charity fundraiser in the Last Year: Never true  . Ran Out of Food in the Last Year: Never true  Transportation Needs: No Transportation Needs  . Lack of Transportation (Medical): No  . Lack of Transportation (Non-Medical): No  Physical Activity: Inactive  . Days of Exercise per Week: 0 days  . Minutes of Exercise per Session: 0 min  Stress: No Stress Concern Present  . Feeling of Stress : Not at all  Social Connections: Slightly Isolated  . Frequency of Communication with Friends and Family: More than three times a week  . Frequency of Social Gatherings with Friends and Family: Once a week  . Attends Religious Services: More than 4 times per year  . Active Member of Clubs or Organizations: No  . Attends Archivist Meetings: Never  . Marital Status: Married  Human resources officer Violence: Not At Risk  . Fear of Current or Ex-Partner: No  . Emotionally Abused: No  . Physically Abused: No  . Sexually Abused: No    Family History  Problem Relation Age of Onset  . Arrhythmia Mother   . Cancer Other   . Cancer Other   . Sleep walking Son     ROS: Generalized weakness that he attributes to MS and arthralgias but no fevers or chills, productive cough, hemoptysis, dysphasia, odynophagia, melena, hematochezia, dysuria, hematuria, rash, seizure activity, orthopnea, PND, claudication. Remaining systems are negative.  Physical Exam: Well-developed obese in no acute distress.  Skin is warm and dry.  HEENT is normal.  Neck is supple.  Chest is clear to auscultation with normal expansion.  Cardiovascular exam is regular rate and rhythm.  Abdominal exam nontender or distended. No masses palpated. Extremities show trace edema. neuro grossly intact  ECG-normal sinus rhythm at a rate of 76, no ST changes.  Personally reviewed  A/P  1 hypertension-patients blood pressure   2 left-ventricular hypertrophy-as outlined in HPI I previously reviewed the patient's outside  echocardiogram and felt left-ventricular hypertrophy was mild with proximal septal thickening.  Will repeat study now that blood pressure has been controlled.  Patient also notes some dyspnea on exertion we will reassess LV function with echo.  3 morbid obesity-he has lost approximately 30 pounds recently.  I congratulated him and he will continue efforts.  4 obstructive sleep apnea-continue CPAP.  Kirk Ruths, MD

## 2019-07-05 ENCOUNTER — Other Ambulatory Visit: Payer: Self-pay | Admitting: Neurosurgery

## 2019-07-05 DIAGNOSIS — Z5181 Encounter for therapeutic drug level monitoring: Secondary | ICD-10-CM | POA: Diagnosis not present

## 2019-07-05 DIAGNOSIS — G629 Polyneuropathy, unspecified: Secondary | ICD-10-CM | POA: Diagnosis not present

## 2019-07-05 DIAGNOSIS — R208 Other disturbances of skin sensation: Secondary | ICD-10-CM | POA: Diagnosis not present

## 2019-07-05 DIAGNOSIS — Z79899 Other long term (current) drug therapy: Secondary | ICD-10-CM | POA: Diagnosis not present

## 2019-07-05 DIAGNOSIS — G35 Multiple sclerosis: Secondary | ICD-10-CM | POA: Diagnosis not present

## 2019-07-07 ENCOUNTER — Ambulatory Visit (INDEPENDENT_AMBULATORY_CARE_PROVIDER_SITE_OTHER): Payer: Medicare Other | Admitting: Cardiology

## 2019-07-07 ENCOUNTER — Encounter: Payer: Self-pay | Admitting: Cardiology

## 2019-07-07 ENCOUNTER — Other Ambulatory Visit: Payer: Self-pay

## 2019-07-07 VITALS — BP 116/78 | HR 76 | Ht 73.0 in | Wt 313.8 lb

## 2019-07-07 DIAGNOSIS — M170 Bilateral primary osteoarthritis of knee: Secondary | ICD-10-CM | POA: Diagnosis not present

## 2019-07-07 DIAGNOSIS — I10 Essential (primary) hypertension: Secondary | ICD-10-CM

## 2019-07-07 DIAGNOSIS — R0602 Shortness of breath: Secondary | ICD-10-CM

## 2019-07-07 DIAGNOSIS — R931 Abnormal findings on diagnostic imaging of heart and coronary circulation: Secondary | ICD-10-CM

## 2019-07-07 NOTE — Patient Instructions (Signed)
Medication Instructions:  NO CHANGE *If you need a refill on your cardiac medications before your next appointment, please call your pharmacy*  Lab Work: If you have labs (blood work) drawn today and your tests are completely normal, you will receive your results only by: Marland Kitchen MyChart Message (if you have MyChart) OR . A paper copy in the mail If you have any lab test that is abnormal or we need to change your treatment, we will call you to review the results.  Testing/Procedures: Your physician has requested that you have an echocardiogram. Echocardiography is a painless test that uses sound waves to create images of your heart. It provides your doctor with information about the size and shape of your heart and how well your heart's chambers and valves are working. This procedure takes approximately one hour. There are no restrictions for this procedure.AT THE [facility]    Follow-Up: At [facility], you and your health needs are our priority.  As part of our continuing mission to provide you with exceptional heart care, we have created designated Provider Care Teams.  These Care Teams include your primary Cardiologist (physician) and Advanced Practice Providers (APPs -  Physician Assistants and Nurse Practitioners) who all work together to provide you with the care you need, when you need it.  Your next appointment:   12 month(s)  The format for your next appointment:   Either In Person or Virtual  Provider:   Kirk Ruths, MD

## 2019-07-08 ENCOUNTER — Ambulatory Visit
Admission: RE | Admit: 2019-07-08 | Discharge: 2019-07-08 | Disposition: A | Discharge status: Home / Self Care | Payer: Medicare Other | Source: Ambulatory Visit | Attending: Neurosurgery | Admitting: Neurosurgery

## 2019-07-08 DIAGNOSIS — M542 Cervicalgia: Secondary | ICD-10-CM

## 2019-07-08 DIAGNOSIS — M4802 Spinal stenosis, cervical region: Secondary | ICD-10-CM | POA: Diagnosis not present

## 2019-07-13 ENCOUNTER — Ambulatory Visit (HOSPITAL_BASED_OUTPATIENT_CLINIC_OR_DEPARTMENT_OTHER)
Admission: RE | Admit: 2019-07-13 | Discharge: 2019-07-13 | Disposition: A | Discharge status: Home / Self Care | Payer: Medicare Other | Source: Ambulatory Visit | Attending: Cardiology | Admitting: Cardiology

## 2019-07-13 ENCOUNTER — Other Ambulatory Visit: Payer: Self-pay

## 2019-07-13 DIAGNOSIS — R0602 Shortness of breath: Secondary | ICD-10-CM | POA: Diagnosis not present

## 2019-07-13 NOTE — Progress Notes (Signed)
  Echocardiogram 2D Echocardiogram has been performed.  Cardell Peach 07/13/2019, 4:08 PM

## 2019-07-19 ENCOUNTER — Telehealth: Payer: Self-pay

## 2019-07-19 DIAGNOSIS — I712 Thoracic aortic aneurysm, without rupture, unspecified: Secondary | ICD-10-CM

## 2019-07-19 NOTE — Telephone Encounter (Signed)
Echo results reviewed with patient. CTA order reviewed with patient and placed in epic. Message sent to scheduling.

## 2019-07-19 NOTE — Telephone Encounter (Signed)
-----   Message from Lelon Perla, MD sent at 07/15/2019 12:48 PM EST ----- Schedule CTA to assess thoracic aorta; normal LV function Kirk Ruths

## 2019-07-20 ENCOUNTER — Other Ambulatory Visit: Payer: Self-pay

## 2019-07-20 DIAGNOSIS — I712 Thoracic aortic aneurysm, without rupture, unspecified: Secondary | ICD-10-CM

## 2019-07-20 DIAGNOSIS — R0602 Shortness of breath: Secondary | ICD-10-CM

## 2019-07-21 ENCOUNTER — Other Ambulatory Visit: Payer: Self-pay | Admitting: Cardiology

## 2019-07-21 ENCOUNTER — Other Ambulatory Visit: Payer: Self-pay | Admitting: *Deleted

## 2019-07-21 DIAGNOSIS — R0602 Shortness of breath: Secondary | ICD-10-CM | POA: Diagnosis not present

## 2019-07-21 DIAGNOSIS — I10 Essential (primary) hypertension: Secondary | ICD-10-CM

## 2019-07-21 DIAGNOSIS — I712 Thoracic aortic aneurysm, without rupture, unspecified: Secondary | ICD-10-CM

## 2019-07-21 DIAGNOSIS — R931 Abnormal findings on diagnostic imaging of heart and coronary circulation: Secondary | ICD-10-CM | POA: Diagnosis not present

## 2019-07-22 LAB — BASIC METABOLIC PANEL
BUN/Creatinine Ratio: 23 — ABNORMAL HIGH (ref 9–20)
BUN: 19 mg/dL (ref 6–24)
CO2: 27 mmol/L (ref 20–29)
Calcium: 9.7 mg/dL (ref 8.7–10.2)
Chloride: 98 mmol/L (ref 96–106)
Creatinine, Ser: 0.84 mg/dL (ref 0.76–1.27)
GFR calc Af Amer: 112 mL/min/{1.73_m2} (ref >59)
GFR calc non Af Amer: 97 mL/min/{1.73_m2} (ref >59)
Glucose: 110 mg/dL — ABNORMAL HIGH (ref 65–99)
Potassium: 4.2 mmol/L (ref 3.5–5.2)
Sodium: 141 mmol/L (ref 134–144)

## 2019-07-26 ENCOUNTER — Encounter: Payer: Self-pay | Admitting: *Deleted

## 2019-07-27 ENCOUNTER — Ambulatory Visit (HOSPITAL_COMMUNITY)
Admission: RE | Admit: 2019-07-27 | Discharge: 2019-07-27 | Disposition: A | Discharge status: Home / Self Care | Payer: Medicare Other | Source: Ambulatory Visit | Attending: Cardiology | Admitting: Cardiology

## 2019-07-27 ENCOUNTER — Other Ambulatory Visit: Payer: Self-pay

## 2019-07-27 ENCOUNTER — Ambulatory Visit (HOSPITAL_COMMUNITY): Payer: Medicare Other

## 2019-07-27 DIAGNOSIS — I712 Thoracic aortic aneurysm, without rupture, unspecified: Secondary | ICD-10-CM

## 2019-07-27 MED ORDER — IOHEXOL 350 MG/ML SOLN
80.0000 mL | Freq: Once | INTRAVENOUS | Status: AC | PRN
  Administered 2019-07-27: 80 mL via INTRAVENOUS

## 2019-07-29 ENCOUNTER — Ambulatory Visit (INDEPENDENT_AMBULATORY_CARE_PROVIDER_SITE_OTHER): Payer: Medicare Other | Admitting: Family Medicine

## 2019-07-29 ENCOUNTER — Encounter: Payer: Self-pay | Admitting: Family Medicine

## 2019-07-29 ENCOUNTER — Other Ambulatory Visit: Payer: Self-pay

## 2019-07-29 VITALS — BP 122/74 | HR 62 | Ht 73.0 in | Wt 307.0 lb

## 2019-07-29 DIAGNOSIS — Z125 Encounter for screening for malignant neoplasm of prostate: Secondary | ICD-10-CM | POA: Diagnosis not present

## 2019-07-29 DIAGNOSIS — G35 Multiple sclerosis: Secondary | ICD-10-CM | POA: Diagnosis not present

## 2019-07-29 DIAGNOSIS — M5412 Radiculopathy, cervical region: Secondary | ICD-10-CM | POA: Diagnosis not present

## 2019-07-29 DIAGNOSIS — I712 Thoracic aortic aneurysm, without rupture: Secondary | ICD-10-CM

## 2019-07-29 DIAGNOSIS — M542 Cervicalgia: Secondary | ICD-10-CM

## 2019-07-29 DIAGNOSIS — I7121 Aneurysm of the ascending aorta, without rupture: Secondary | ICD-10-CM

## 2019-07-29 DIAGNOSIS — I10 Essential (primary) hypertension: Secondary | ICD-10-CM | POA: Diagnosis not present

## 2019-07-29 DIAGNOSIS — K760 Fatty (change of) liver, not elsewhere classified: Secondary | ICD-10-CM

## 2019-07-29 DIAGNOSIS — R918 Other nonspecific abnormal finding of lung field: Secondary | ICD-10-CM

## 2019-07-29 NOTE — Progress Notes (Signed)
Established Patient Office Visit  Subjective:  Patient ID: Mark [name]., male    DOB: 04-24-1961  Age: 59 y.o. MRN: [id number]  CC:  Chief Complaint  Patient presents with  . Hypertension    HPI [name]. presents for   Hypertension- Pt denies chest pain, SOB, dizziness, or heart palpitations.  Taking meds as directed w/o problems.  Denies medication side effects.    Has a disc that needs to be repaired in your neck.  SEeing Dr. Weston Settle,  [facility]. He has been dealing with that pain.  He also has bilat knee pain and needs bilat knee replacements which plans on doing thi s year.    He had some questions about recent CT Angio he had down it did show Aortic aneurysm measuring 4.5 cm. and will be followed by Dr. Stanford Breed.  Had an echo as well.  He was also noticed to have ground glass opacity in both upper lungs which they felt was possibly related to CHF but he is not symptomatic with CHF sxs or volume overload.  He does occ get SOB with activity.  He has never been a smoker and no history so sig 2nd hand smoke.  He was a Dealer.    CT also showed fatty liver.  Will need to monitor liver functions Q6 mo.   Past Medical History:  Diagnosis Date  . Anxiety    chronic pain treated with narcotics  . Arthritis   . Chronic pain   . Constipation due to opioid therapy   . Depression   . Dysphonia    thoracic left sided"impingement" with heminumbness  . GI bleed    abt 15 years ago  . Morbid obesity ([place])   . MS (multiple sclerosis) ([place]) 02/11/2013   [name]  . Pneumonia   . Sleep apnea with use of continuous positive airway pressure (CPAP) 02/11/2013   07-13-12 AHi of 98.6 /hr titrated to 11 cm water ,  average user time 4 hours and 4 minutes. Residual AHI 1.20 December 2012 .  Marland Kitchen Umbilical hernia     Past Surgical History:  Procedure Laterality Date  . CARPAL TUNNEL RELEASE Left    [name]  . COLONOSCOPY W/ POLYPECTOMY    .  POSTERIOR LUMBAR FUSION  04/17/15   [name], also removed old hardware  . SPINE SURGERY     total of six vertebras fused, three lumbar surgeries and one anterior neck fusion  . TONSILLECTOMY      Family History  Problem Relation Age of Onset  . Arrhythmia Mother   . Cancer Other   . Cancer Other   . Sleep walking Son     Social History   Socioeconomic History  . Marital status: Married    Spouse name: [name]  . Number of children: 2  . Years of education: 12+  . Highest education level: Some college, no degree  Occupational History  . 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 Smoking status: Never Smoker  . Smokeless tobacco: Never Used  Substance and Sexual Activity  . Alcohol use: Yes    Comment: occasionally  . Drug use: No  . Sexual activity: Not Currently  Other Topics Concern  . Not on file  Social History Narrative       Patient is married [name]) and lives at home with his wife, daughter, son-in-law and grandchild.   Patient has  two adult children.   Patient is right-handed.   Patient is disabled.   Patient has a college education.   Patient drinks a gallon of tea daily.      Social Determinants of Health   Financial Resource Strain: Low Risk   . Difficulty of Paying Living Expenses: Not hard at all  Food Insecurity: No Food Insecurity  . Worried About Charity fundraiser in the Last Year: Never true  . Ran Out of Food in the Last Year: Never true  Transportation Needs: No Transportation Needs  . Lack of Transportation (Medical): No  . Lack of Transportation (Non-Medical): No  Physical Activity: Inactive  . Days of Exercise per Week: 0 days  . Minutes of Exercise per Session: 0 min  Stress: No Stress Concern Present  . Feeling of Stress : Not at all  Social Connections: Slightly Isolated  . Frequency of Communication with Friends and Family: More than three times a week  . Frequency of Social Gatherings with Friends and  Family: Once a week  . Attends Religious Services: More than 4 times per year  . Active Member of Clubs or Organizations: No  . Attends Archivist Meetings: Never  . Marital Status: Married  Human resources officer Violence: Not At Risk  . Fear of Current or Ex-Partner: No  . Emotionally Abused: No  . Physically Abused: No  . Sexually Abused: No    Outpatient Medications Prior to Visit  Medication Sig Dispense Refill  . amantadine (SYMMETREL) 100 MG capsule Take 100 mg by mouth 3 (three) times daily. Take 1 capsule (100 mg total) by mouth 3 times daily. For heat related fatigue of MS    . amphetamine-dextroamphetamine (ADDERALL XR) 30 MG 24 hr capsule Take 30 mg by mouth.    . DULoxetine (CYMBALTA) 60 MG capsule Take 60 mg by mouth 2 (two) times daily.     . fluticasone furoate-vilanterol (BREO ELLIPTA) 100-25 MCG/INH AEPB Inhale 1 puff into the lungs daily.    Marland Kitchen gabapentin (NEURONTIN) 800 MG tablet Take 800 mg by mouth 4 (four) times daily.  11  . HYDROcodone-acetaminophen (NORCO) 10-325 MG per tablet Take 2 tablets by mouth every 4 (four) hours as needed for severe pain.     Marland Kitchen ibuprofen (ADVIL,MOTRIN) 800 MG tablet Take 800 mg by mouth every 8 (eight) hours as needed for moderate pain.    Marland Kitchen loratadine (CLARITIN) 10 MG tablet Take 10 mg by mouth at bedtime.    Marland Kitchen losartan-hydrochlorothiazide (HYZAAR) 50-12.5 MG tablet TAKE ONE TABLET BY MOUTH EVERY DAY 90 tablet 1  . tamsulosin (FLOMAX) 0.4 MG CAPS capsule Take 0.4 mg by mouth at bedtime.     . Teriflunomide (AUBAGIO) 14 MG TABS Take 14 mg by mouth at bedtime. Dr Rae Mar    . tiZANidine (ZANAFLEX) 4 MG tablet Take 4 mg by mouth every 6 (six) hours as needed.     . VENTOLIN HFA 108 (90 Base) MCG/ACT inhaler Inhale 1 puff into the lungs every 6 (six) hours as needed for wheezing or shortness of breath. 18 g prn   No facility-administered medications prior to visit.    No Known Allergies  ROS Review of Systems    Objective:     Physical Exam  Constitutional: He is oriented to person, place, and time. He appears well-developed and well-nourished.  HENT:  Head: Normocephalic and atraumatic.  Eyes: Conjunctivae are normal.  Cardiovascular: Normal rate, regular rhythm and normal heart sounds.  Pulmonary/Chest:  Effort normal and breath sounds normal.  Musculoskeletal:        General: No edema.  Neurological: He is alert and oriented to person, place, and time.  Skin: Skin is warm and dry.  Psychiatric: He has a normal mood and affect. His behavior is normal.    BP 122/74   Pulse 62   Ht 6\' 1"  (1.854 m)   Wt (!) 307 lb (139.3 kg)   SpO2 94%   BMI 40.50 kg/m  Wt Readings from Last 3 Encounters:  07/29/19 (!) 307 lb (139.3 kg)  07/07/19 (!) 313 lb 12.8 oz (142.3 kg)  06/21/19 300 lb (136.1 kg)     Health Maintenance Due  Topic Date Due  . HIV Screening  01/17/1977  . COLONOSCOPY  11/02/2018  . INFLUENZA VACCINE  02/20/2019    There are no preventive care reminders to display for this patient.  Lab Results  Component Value Date   TSH 0.33 (L) 03/09/2018   Lab Results  Component Value Date   WBC 9.1 03/09/2018   HGB 12.7 (L) 03/09/2018   HCT 38.5 03/09/2018   MCV 86.5 03/09/2018   PLT 268 03/09/2018   Lab Results  Component Value Date   NA 141 07/21/2019   K 4.2 07/21/2019   CO2 27 07/21/2019   GLUCOSE 110 (H) 07/21/2019   BUN 19 07/21/2019   CREATININE 0.84 07/21/2019   BILITOT 0.3 03/09/2018   ALKPHOS 92 08/20/2016   AST 12 03/09/2018   ALT 19 03/09/2018   PROT 6.3 03/09/2018   ALBUMIN 4.3 08/20/2016   CALCIUM 9.7 07/21/2019   ANIONGAP 6 05/14/2016   Lab Results  Component Value Date   CHOL 140 01/04/2015   Lab Results  Component Value Date   HDL 43 01/04/2015   Lab Results  Component Value Date   LDLCALC 78 01/04/2015   Lab Results  Component Value Date   TRIG 93 01/04/2015   Lab Results  Component Value Date   CHOLHDL 3.3 01/04/2015   Lab Results   Component Value Date   HGBA1C 5.1 05/26/2017      Assessment & Plan:   Problem List Items Addressed This Visit      Cardiovascular and Mediastinum   Essential hypertension - Primary    Well controlled. Continue current regimen. Follow up in  6 mo      Relevant Orders   Lipid panel   Hemoglobin A1c   Ascending aortic aneurysm (HCC)    New Dx. CT Angio 07/27/2019: 4.5 cm. Following with Dr. Stanford Breed.         Digestive   Fatty liver    New Dx.  Will need to monitor liver functions Q6 mo.          Other   Neck pain    Planning for cervical surgery.       Ground glass opacity present on imaging of lung    Unclear etiology. No sign of active acute heart failure symptoms.  No sign of acute infection.         Other Visit Diagnoses    Screening for prostate cancer       Relevant Orders   PSA      No orders of the defined types were placed in this encounter.   Follow-up: Return in about 1 week (around 08/05/2019) for Bp machine check with nurse visit .    Beatrice Lecher, MD

## 2019-07-30 ENCOUNTER — Encounter: Payer: Self-pay | Admitting: Family Medicine

## 2019-07-30 DIAGNOSIS — I712 Thoracic aortic aneurysm, without rupture: Secondary | ICD-10-CM | POA: Insufficient documentation

## 2019-07-30 DIAGNOSIS — K760 Fatty (change of) liver, not elsewhere classified: Secondary | ICD-10-CM | POA: Insufficient documentation

## 2019-07-30 DIAGNOSIS — R918 Other nonspecific abnormal finding of lung field: Secondary | ICD-10-CM | POA: Insufficient documentation

## 2019-07-30 DIAGNOSIS — I7121 Aneurysm of the ascending aorta, without rupture: Secondary | ICD-10-CM | POA: Insufficient documentation

## 2019-07-30 NOTE — Assessment & Plan Note (Signed)
Well controlled. Continue current regimen. Follow up in  6 mo  

## 2019-07-30 NOTE — Assessment & Plan Note (Signed)
Unclear etiology. No sign of active acute heart failure symptoms.  No sign of acute infection.

## 2019-07-30 NOTE — Assessment & Plan Note (Addendum)
New Dx. CT Angio 07/27/2019: 4.5 cm. Following with Dr. Stanford Breed.

## 2019-07-30 NOTE — Assessment & Plan Note (Addendum)
New Dx.  Will need to monitor liver functions Q6 mo.

## 2019-07-30 NOTE — Assessment & Plan Note (Signed)
Planning for cervical surgery.

## 2019-08-02 ENCOUNTER — Telehealth (INDEPENDENT_AMBULATORY_CARE_PROVIDER_SITE_OTHER): Payer: Medicare Other | Admitting: Cardiology

## 2019-08-02 ENCOUNTER — Encounter: Payer: Self-pay | Admitting: Cardiology

## 2019-08-02 VITALS — BP 151/93 | HR 75 | Ht 73.0 in | Wt 304.0 lb

## 2019-08-02 DIAGNOSIS — I712 Thoracic aortic aneurysm, without rupture, unspecified: Secondary | ICD-10-CM

## 2019-08-02 DIAGNOSIS — I10 Essential (primary) hypertension: Secondary | ICD-10-CM | POA: Diagnosis not present

## 2019-08-02 NOTE — Patient Instructions (Signed)
Medication Instructions:  NO CHANGE *If you need a refill on your cardiac medications before your next appointment, please call your pharmacy*  Lab Work: If you have labs (blood work) drawn today and your tests are completely normal, you will receive your results only by: . MyChart Message (if you have MyChart) OR . A paper copy in the mail If you have any lab test that is abnormal or we need to change your treatment, we will call you to review the results.  Follow-Up: At [facility], you and your health needs are our priority.  As part of our continuing mission to provide you with exceptional heart care, we have created designated Provider Care Teams.  These Care Teams include your primary Cardiologist (physician) and Advanced Practice Providers (APPs -  Physician Assistants and Nurse Practitioners) who all work together to provide you with the care you need, when you need it.  Your next appointment:   6 month(s)  The format for your next appointment:   Either In Person or Virtual  Provider:   You may see BRIAN CRENSHAW MD or one of the following Advanced Practice Providers on your designated Care Team:    Luke Kilroy, PA-C  Callie Goodrich, PA-C  Jesse Cleaver, FNP    

## 2019-08-02 NOTE — Progress Notes (Signed)
Virtual Visit via Video Note   This visit type was conducted due to national recommendations for restrictions regarding the COVID-19 Pandemic (e.g. social distancing) in an effort to limit this patient's exposure and mitigate transmission in our community.  Due to his co-morbid illnesses, this patient is at least at moderate risk for complications without adequate follow up.  This format is felt to be most appropriate for this patient at this time.  All issues noted in this document were discussed and addressed.  A limited physical exam was performed with this format.  Please refer to the patient's chart for his consent to telehealth for [facility].   Date:  08/02/2019   ID:  Mark [name]., DOB [birth year], MRN [id number]  Patient Location:Home Provider Location: Home  PCP:  Hali Marry, MD  Cardiologist:  Dr Stanford Breed  Evaluation Performed:  Follow-Up Visit  Chief Complaint:  FU TAA  History of Present Illness:    FUhypertensionand TAA. Outside echocardiogram October 2019 showed normal LV function and severe left ventricular hypertrophy.Plan at previous office visit was to control blood pressure and repeat echocardiogram in the future. I also reviewed previous outside echocardiogram and felt left ventricular hypertrophy was mild with mild proximal septal thickening. Echocardiogram December 2020 showed normal LV function and moderately dilated ascending aorta measuring 49 mm. CTA January 2021 showed ascending thoracic aortic aneurysm measuring 4.5 cm.  There were areas of possible edema.  Since last seen,the patient denies any dyspnea on exertion, orthopnea, PND, pedal edema, palpitations, syncope or chest pain.   The patient does not have symptoms concerning for COVID-19 infection (fever, chills, cough, or new shortness of breath).    Past Medical History:  Diagnosis Date  . Anxiety    chronic pain treated with narcotics  . Arthritis   . Chronic pain    . Constipation due to opioid therapy   . Depression   . Dysphonia    thoracic left sided"impingement" with heminumbness  . GI bleed    abt 15 years ago  . Morbid obesity ([place])   . MS (multiple sclerosis) ([place]) 02/11/2013   [name]  . Pneumonia   . Sleep apnea with use of continuous positive airway pressure (CPAP) 02/11/2013   07-13-12 AHi of 98.6 /hr titrated to 11 cm water ,  average user time 4 hours and 4 minutes. Residual AHI 1.20 December 2012 .  Marland Kitchen Umbilical hernia    Past Surgical History:  Procedure Laterality Date  . CARPAL TUNNEL RELEASE Left    [name]  . COLONOSCOPY W/ POLYPECTOMY    . POSTERIOR LUMBAR FUSION  04/17/15   [name], also removed old hardware  . SPINE SURGERY     total of six vertebras fused, three lumbar surgeries and one anterior neck fusion  . TONSILLECTOMY       Current Meds  Medication Sig  . amantadine (SYMMETREL) 100 MG capsule Take 100 mg by mouth 3 (three) times daily. Take 1 capsule (100 mg total) by mouth 3 times daily. For heat related fatigue of MS  . amphetamine-dextroamphetamine (ADDERALL XR) 30 MG 24 hr capsule Take 30 mg by mouth.  . DULoxetine (CYMBALTA) 60 MG capsule Take 60 mg by mouth 2 (two) times daily.   . fluticasone furoate-vilanterol (BREO ELLIPTA) 100-25 MCG/INH AEPB Inhale 1 puff into the lungs daily.  Marland Kitchen gabapentin (NEURONTIN) 800 MG tablet Take 800 mg by mouth 4 (four) times daily.  Marland Kitchen HYDROcodone-acetaminophen (NORCO) 10-325 MG  per tablet Take 2 tablets by mouth every 4 (four) hours as needed for severe pain.   Marland Kitchen ibuprofen (ADVIL,MOTRIN) 800 MG tablet Take 800 mg by mouth every 8 (eight) hours as needed for moderate pain.  Marland Kitchen loratadine (CLARITIN) 10 MG tablet Take 10 mg by mouth at bedtime.  Marland Kitchen losartan-hydrochlorothiazide (HYZAAR) 50-12.5 MG tablet TAKE ONE TABLET BY MOUTH EVERY DAY  . omeprazole (PRILOSEC) 20 MG capsule Take 20 mg by mouth daily.  . tadalafil (CIALIS) 20 MG tablet Take 20 mg by mouth daily as needed  for erectile dysfunction.  . tamsulosin (FLOMAX) 0.4 MG CAPS capsule Take 0.4 mg by mouth at bedtime.   . Teriflunomide (AUBAGIO) 14 MG TABS Take 14 mg by mouth at bedtime. Dr Rae Mar  . tiZANidine (ZANAFLEX) 4 MG tablet Take 4 mg by mouth every 6 (six) hours as needed.   . VENTOLIN HFA 108 (90 Base) MCG/ACT inhaler Inhale 1 puff into the lungs every 6 (six) hours as needed for wheezing or shortness of breath.     Allergies:   Patient has no known allergies.   Social History   Tobacco Use  . Smoking status: Never Smoker  . Smokeless tobacco: Never Used  Substance Use Topics  . Alcohol use: Yes    Comment: occasionally  . Drug use: No     Family Hx: The patient's family history includes Arrhythmia in his mother; Cancer in some other family members; Sleep walking in his son.  ROS:   Please see the history of present illness.    No Fever, chills  or productive cough All other systems reviewed and are negative.  Recent Labs: 07/21/2019: BUN 19; Creatinine, Ser 0.84; Potassium 4.2; Sodium 141   Recent Lipid Panel Lab Results  Component Value Date/Time   CHOL 140 01/04/2015 11:41 AM   TRIG 93 01/04/2015 11:41 AM   HDL 43 01/04/2015 11:41 AM   CHOLHDL 3.3 01/04/2015 11:41 AM   LDLCALC 78 01/04/2015 11:41 AM    Wt Readings from Last 3 Encounters:  08/02/19 (!) 304 lb (137.9 kg)  07/29/19 (!) 307 lb (139.3 kg)  07/07/19 (!) 313 lb 12.8 oz (142.3 kg)     Objective:    Vital Signs:  BP (!) 151/93   Pulse 75   Ht 6\' 1"  (1.854 m)   Wt (!) 304 lb (137.9 kg)   BMI 40.11 kg/m    VITAL SIGNS:  reviewed NAD Answers questions appropriately Normal affect Remainder of physical examination not performed (telehealth visit; coronavirus pandemic)  ASSESSMENT & PLAN:    1. Thoracic aortic aneurysm-we will plan to repeat CTA July 2021.  We discussed the importance of blood pressure control.  Avoid ciprofloxacin/quinolones.  Note patient's aortic valve is trileaflet on most  recent echocardiogram. 2. Hypertension-blood pressure elevated.  However he states typically controlled.  He will follow this and we will increase medications as needed.   3. Obesity-patient continues to work on weight loss. 4. Obstructive sleep apnea-continue CPAP. 5. History of left ventricular hypertrophy-not evident on most recent echocardiogram.  COVID-19 Education: The importance of social distancing was discussed today.  Time:   Today, I have spent 14 minutes with the patient with telehealth technology discussing the above problems.     Medication Adjustments/Labs and Tests Ordered: Current medicines are reviewed at length with the patient today.  Concerns regarding medicines are outlined above.   Tests Ordered: No orders of the defined types were placed in this encounter.   Medication Changes:  No orders of the defined types were placed in this encounter.   Follow Up:  Either In Person or Virtual in 6 month(s)  Signed, Kirk Ruths, MD  08/02/2019 9:19 AM    [place]

## 2019-08-03 DIAGNOSIS — I10 Essential (primary) hypertension: Secondary | ICD-10-CM | POA: Diagnosis not present

## 2019-08-03 DIAGNOSIS — Z125 Encounter for screening for malignant neoplasm of prostate: Secondary | ICD-10-CM | POA: Diagnosis not present

## 2019-08-04 LAB — LIPID PANEL
Cholesterol: 165 mg/dL (ref <200)
HDL: 51 mg/dL (ref >=40)
LDL Cholesterol (Calc): 99 mg/dL (calc)
Non-HDL Cholesterol (Calc): 114 mg/dL (calc) (ref <130)
Total CHOL/HDL Ratio: 3.2 (calc) (ref <5.0)
Triglycerides: 61 mg/dL (ref <150)

## 2019-08-04 LAB — HEMOGLOBIN A1C
Hgb A1c MFr Bld: 5.3 % of total Hgb (ref <5.7)
Mean Plasma Glucose: 105 (calc)
eAG (mmol/L): 5.8 (calc)

## 2019-08-04 LAB — PSA: PSA: 0.4 ng/mL (ref <=4.0)

## 2019-08-05 ENCOUNTER — Other Ambulatory Visit: Payer: Self-pay

## 2019-08-05 ENCOUNTER — Ambulatory Visit (INDEPENDENT_AMBULATORY_CARE_PROVIDER_SITE_OTHER): Payer: Medicare Other | Admitting: Family Medicine

## 2019-08-05 VITALS — BP 134/80 | HR 60 | Ht 73.0 in | Wt 307.0 lb

## 2019-08-05 DIAGNOSIS — I10 Essential (primary) hypertension: Secondary | ICD-10-CM | POA: Diagnosis not present

## 2019-08-05 NOTE — Progress Notes (Signed)
Agree with documentation as above. BP cuff ok but reading a little high.  Continue to monitor    Beatrice Lecher, MD

## 2019-08-05 NOTE — Progress Notes (Signed)
Patient presents to clinic for a blood pressure check. He reports he is taking his medication at home in the morning. Per PCP patient can come back in 6 months. Husband and wife were advised if the blood pressure was greater than 140/90 to let the doctor know. No other questions.   Home machine: 141/85 and Heartrate is 66 Our machine: 134/80 and Heartrate of 60

## 2019-08-23 ENCOUNTER — Other Ambulatory Visit: Payer: Self-pay | Admitting: Neurosurgery

## 2019-08-25 ENCOUNTER — Other Ambulatory Visit: Payer: Self-pay | Admitting: Family Medicine

## 2019-08-25 ENCOUNTER — Telehealth: Payer: Self-pay

## 2019-08-25 NOTE — Telephone Encounter (Signed)
Hi Dr. Stanford Breed,  Mr. Sooy has a cervical anterior fusion and removal of cervical plate scheduled for [id number]. He has was recently diagnosed with a 4.5 cm ascending thoracic aortic aneurysm on CTA in 07/2019. Also has a hypertension, morbid obesity, and sleep apnea. You recently saw him on 08/02/2019 for a telehealth visit. He was doing well at that time and denied any chest pain, dyspnea on exertion, orthopnea, PND, edema, palpitations, or syncope. Do you feel patient is OK to proceed with surgery at this time?  Please route response back to P CV DIV PREOP.  Thank you! Callie

## 2019-08-25 NOTE — Telephone Encounter (Signed)
   [facility] Pre-operative Risk Assessment    Request for surgical clearance:  1. What type of surgery is being performed? CERVICAL ANTERIOR  FUSION C4-C5 W/removal C5-C7 plate  2. When is this surgery scheduled? 09-29-2019   3. What type of clearance is required (medical clearance vs. Pharmacy clearance to hold med vs. Both)? MEDICAL  4. Are there any medications that need to be held prior to surgery and how long? NONE LISTED   5. Practice name and name of physician performing surgery? [facility]  DR [name] ATTN:VANESSA  6. What is your office phone number [phone]    7.   What is your office fax number [phone]  8.   Anesthesia type (None, local, MAC, general) ? GENERAL

## 2019-08-25 NOTE — Telephone Encounter (Signed)
   Primary Cardiologist: Kirk Ruths, MD  Chart reviewed as part of pre-operative protocol coverage. Patient recently seen by Dr. Stanford Breed on 08/02/2019 for a telehealth visit. Patient was doing well at that time with no cardiac complaints. Per Dr. Stanford Breed, "patient may proceed with surgery without further cardiac evaluation."   I will route this recommendation to the requesting party via [place] fax function and remove from pre-op pool.  Please call with questions.  Darreld Mclean, PA-C 08/25/2019, 11:12 AM

## 2019-08-25 NOTE — Telephone Encounter (Signed)
Patient may proceed with surgery without further cardiac evaluation. Mark [name]

## 2019-08-30 ENCOUNTER — Other Ambulatory Visit (HOSPITAL_COMMUNITY): Payer: Medicare Other

## 2019-08-31 DIAGNOSIS — M5412 Radiculopathy, cervical region: Secondary | ICD-10-CM | POA: Diagnosis not present

## 2019-08-31 DIAGNOSIS — M542 Cervicalgia: Secondary | ICD-10-CM | POA: Diagnosis not present

## 2019-08-31 DIAGNOSIS — G629 Polyneuropathy, unspecified: Secondary | ICD-10-CM | POA: Diagnosis not present

## 2019-09-17 DIAGNOSIS — G5601 Carpal tunnel syndrome, right upper limb: Secondary | ICD-10-CM | POA: Diagnosis not present

## 2019-09-17 DIAGNOSIS — M5412 Radiculopathy, cervical region: Secondary | ICD-10-CM | POA: Diagnosis not present

## 2019-09-24 NOTE — Progress Notes (Signed)
[place], [place] - [id number] [place] [address] Phone: [phone] Fax: [place], [place] - [address] [place] [address] Phone: [phone] Fax: [place], [place] - [address] [place] [address] Phone: [phone] Fax: [phone]      Your procedure is scheduled on Wednesday, March 10th.  Report to [facility] Main Entrance "A" at 6:30 A.M., and check in at the Admitting  office.  Call this number if you have problems the morning of surgery:  [phone]  Call [phone] if you have any questions prior to your surgery date Monday-Friday 8am-4pm    Remember:  Do not eat or drink after midnight the night before your surgery     Take these medicines the morning of surgery with A SIP OF WATER   Amantadine (Symmerel)  Breo Ellipta Inhaler - if needed (bring with you on day of surgery)  Hydrocodone-Acetaminophen - if needed  Claritin - if needed  Omeprazole (Prilosec)  Ventolin inhaler - if needed (bring with you on day of surgery)  Cymbalta  Tizanidine (Zanaflex)  7 days prior to surgery STOP taking any Aspirin (unless otherwise instructed by your surgeon), Aleve, Naproxen, Ibuprofen, Motrin, Advil, Goody's, BC's, all herbal medications, fish oil, and all vitamins.    The Morning of Surgery  Do not wear jewelry.  Do not wear lotions, powders, colognes, or deodorant  Men may shave face and neck.  Do not bring valuables to the hospital.  [facility] is not responsible for any belongings or valuables.  If you are a smoker, DO NOT Smoke 24 hours prior to surgery  If you wear a CPAP at night please bring your mask the morning of surgery   Remember that you must have someone to transport you home after your surgery, and remain with you for 24 hours if you are discharged the same day.   Please  bring cases for contacts, glasses, hearing aids, dentures or bridgework because it cannot be worn into surgery.    Leave your suitcase in the car.  After surgery it may be brought to your room.  For patients admitted to the hospital, discharge time will be determined by your treatment team.  Patients discharged the day of surgery will not be allowed to drive home.    Special instructions:   [facility]- Preparing For Surgery  Before surgery, you can play an important role. Because skin is not sterile, your skin needs to be as free of germs as possible. You can reduce the number of germs on your skin by washing with CHG (chlorahexidine gluconate) Soap before surgery.  CHG is an antiseptic cleaner which kills germs and bonds with the skin to continue killing germs even after washing.    Oral Hygiene is also important to reduce your risk of infection.  Remember - BRUSH YOUR TEETH THE MORNING OF SURGERY WITH YOUR REGULAR TOOTHPASTE  Please do not use if you have an allergy to CHG or antibacterial soaps. If your skin becomes reddened/irritated stop using the CHG.  Do not shave (including legs and underarms) for at least 48 hours prior to first CHG shower. It is OK to shave your face.  Please follow these instructions carefully.   1. Shower the NIGHT BEFORE SURGERY and the MORNING OF SURGERY with CHG Soap.   2. If you  chose to wash your hair, wash your hair first as usual with your normal shampoo.  3. After you shampoo, rinse your hair and body thoroughly to remove the shampoo.  4. Use CHG as you would any other liquid soap. You can apply CHG directly to the skin and wash gently with a scrungie or a clean washcloth.   5. Apply the CHG Soap to your body ONLY FROM THE NECK DOWN.  Do not use on open wounds or open sores. Avoid contact with your eyes, ears, mouth and genitals (private parts). Wash Face and genitals (private parts)  with your normal soap.   6. Wash thoroughly, paying special  attention to the area where your surgery will be performed.  7. Thoroughly rinse your body with warm water from the neck down.  8. DO NOT shower/wash with your normal soap after using and rinsing off the CHG Soap.  9. Pat yourself dry with a CLEAN TOWEL.  10. Wear CLEAN PAJAMAS to bed the night before surgery, wear comfortable clothes the morning of surgery  11. Place CLEAN SHEETS on your bed the night of your first shower and DO NOT SLEEP WITH PETS.    Day of Surgery:  Please shower the morning of surgery with the CHG soap Do not apply any deodorants/lotions. Please wear clean clothes to the hospital/surgery center.   Remember to brush your teeth WITH YOUR REGULAR TOOTHPASTE.   Please read over the following fact sheets that you were given.

## 2019-09-27 ENCOUNTER — Encounter (HOSPITAL_COMMUNITY): Payer: Self-pay

## 2019-09-27 ENCOUNTER — Other Ambulatory Visit: Payer: Self-pay

## 2019-09-27 ENCOUNTER — Encounter (HOSPITAL_COMMUNITY)
Admission: RE | Admit: 2019-09-27 | Discharge: 2019-09-27 | Disposition: A | Discharge status: Home / Self Care | Payer: Medicare Other | Source: Ambulatory Visit | Attending: Neurosurgery | Admitting: Neurosurgery

## 2019-09-27 ENCOUNTER — Other Ambulatory Visit (HOSPITAL_COMMUNITY)
Admission: RE | Admit: 2019-09-27 | Discharge: 2019-09-27 | Disposition: A | Discharge status: Home / Self Care | Payer: Medicare Other | Source: Ambulatory Visit | Attending: Neurosurgery | Admitting: Neurosurgery

## 2019-09-27 DIAGNOSIS — R0600 Dyspnea, unspecified: Secondary | ICD-10-CM | POA: Insufficient documentation

## 2019-09-27 DIAGNOSIS — G4733 Obstructive sleep apnea (adult) (pediatric): Secondary | ICD-10-CM | POA: Diagnosis not present

## 2019-09-27 DIAGNOSIS — Z01812 Encounter for preprocedural laboratory examination: Secondary | ICD-10-CM | POA: Insufficient documentation

## 2019-09-27 DIAGNOSIS — Z20822 Contact with and (suspected) exposure to covid-19: Secondary | ICD-10-CM | POA: Insufficient documentation

## 2019-09-27 DIAGNOSIS — J449 Chronic obstructive pulmonary disease, unspecified: Secondary | ICD-10-CM | POA: Diagnosis not present

## 2019-09-27 HISTORY — DX: Dyspnea, unspecified: R06.00

## 2019-09-27 LAB — BASIC METABOLIC PANEL
Anion gap: 7 (ref 5–15)
BUN: 14 mg/dL (ref 6–20)
CO2: 30 mmol/L (ref 22–32)
Calcium: 9.4 mg/dL (ref 8.9–10.3)
Chloride: 104 mmol/L (ref 98–111)
Creatinine, Ser: 0.96 mg/dL (ref 0.61–1.24)
GFR calc Af Amer: >60 mL/min (ref >60)
GFR calc non Af Amer: >60 mL/min (ref >60)
Glucose, Bld: 123 mg/dL — ABNORMAL HIGH (ref 70–99)
Potassium: 3.9 mmol/L (ref 3.5–5.1)
Sodium: 141 mmol/L (ref 135–145)

## 2019-09-27 LAB — CBC
HCT: 41 % (ref 39.0–52.0)
Hemoglobin: 13.2 g/dL (ref 13.0–17.0)
MCH: 28.9 pg (ref 26.0–34.0)
MCHC: 32.2 g/dL (ref 30.0–36.0)
MCV: 89.7 fL (ref 80.0–100.0)
Platelets: 230 10*3/uL (ref 150–400)
RBC: 4.57 MIL/uL (ref 4.22–5.81)
RDW: 12.9 % (ref 11.5–15.5)
WBC: 6.6 10*3/uL (ref 4.0–10.5)
nRBC: 0 % (ref 0.0–0.2)

## 2019-09-27 LAB — SURGICAL PCR SCREEN
MRSA, PCR: NEGATIVE
Staphylococcus aureus: NEGATIVE

## 2019-09-27 LAB — TYPE AND SCREEN
ABO/RH(D): O NEG
Antibody Screen: NEGATIVE

## 2019-09-27 LAB — SARS CORONAVIRUS 2 (TAT 6-24 HRS): SARS Coronavirus 2: NEGATIVE

## 2019-09-27 NOTE — Progress Notes (Signed)
PCP - Beatrice Lecher Cardiologist - [place]  Chest x-ray - not needed EKG - 07/07/19 Stress Test - denies ECHO - 07/13/19 Cardiac Cath - denies  Sleep Study - 12/28/17 (media tab) CPAP - at night instructed to bring mask dos   COVID TEST- 09/27/19   Anesthesia review: yes, cardiac hx, COPD (pt denies but was listed in a care everywhere note from pulmonology)  Patient denies shortness of breath, fever, cough and chest pain at PAT appointment   All instructions explained to the patient, with a verbal understanding of the material. Patient agrees to go over the instructions while at home for a better understanding. Patient also instructed to self quarantine after being tested for COVID-19. The opportunity to ask questions was provided.

## 2019-09-28 MED ORDER — DEXTROSE 5 % IV SOLN
3.0000 g | INTRAVENOUS | Status: AC
  Administered 2019-09-29: 3 g via INTRAVENOUS
  Filled 2019-09-28: qty 3

## 2019-09-28 NOTE — Progress Notes (Signed)
Anesthesia Chart Review:  Follows with cardiology for hx of TAA. Echocardiogram December 2020 showed normal LV function and moderately dilated ascending aorta measuring 49 mm. CTA January 2021 showed ascending thoracic aortic aneurysm measuring 4.5 cm.  Last seen by Dr. Stanford Breed 08/02/19. Per note, plan was to repeat echo in July 2021. Dr. Stanford Breed cleared pt for surgery per telephone encounter 08/25/19 stating, "Patient may proceed with surgery without further cardiac evaluation."  CTA also noted bilateral ground glass opacities. This is being followed by PCP Dr. Suzi Roots. Per note 07/29/19, "Unclear etiology. No sign of active acute heart failure symptoms.  No sign of acute infection." Also discussed he was to be having neck surgery soon.   Follows with pulmonology at [facility] for hx of OSA on CPAP, DOE, and asthma. Spirometry 12/23/18 showed moderate restrictive disease with normal diffusing capacity. 6 minute walk test 06/24/19 with 539ft walked and now desat below 91%. Last seen 06/24/19. Doing well per note, no active wheezing or coughing. Recommended continue Breo and PRN albuterol.   Preop labs reviewed, unremarkable.   EKG 07/07/19: NSR. Rate 76. Low voltage QRS.  TTE 07/13/19: 1. Left ventricular ejection fraction, by visual estimation, is 60 to  65%. The left ventricle has normal function. There is no left ventricular  hypertrophy.  2. Left ventricular diastolic parameters are consistent with Grade I  diastolic dysfunction (impaired relaxation).  3. The left ventricle has no regional wall motion abnormalities.  4. The mitral valve is normal in structure. No evidence of mitral valve  regurgitation. No evidence of mitral stenosis.  5. The tricuspid valve is normal in structure.  6. The aortic valve is normal in structure. Aortic valve regurgitation is  not visualized. No evidence of aortic valve sclerosis or stenosis.  7. The pulmonic valve was normal in structure. Pulmonic valve   regurgitation is not visualized.  8. Aneurysm of the ascending aorta.  9. There is moderate dilatation of the ascending aorta measuring 49 mm.   CTA chest 07/27/19: IMPRESSION: 1. Aneurysmal disease of the ascending thoracic aorta measuring 4.5 cm in greatest diameter. The aortic root measures 4.3-4.4 cm. No evidence of aortic dissection. Ascending thoracic aortic aneurysm. Recommend semi-annual imaging followup by CTA or MRA and referral to cardiothoracic surgery if not already obtained. This recommendation follows 2010 ACCF/AHA/AATS/ACR/ASA/SCA/SCAI/SIR/STS/SVM Guidelines for the Diagnosis and Management of Patients With Thoracic Aortic Disease. Circulation. 2010; [id number]:9224643. Aortic aneurysm NOS (ICD10-I71.9) 2. New geographic areas of mosaic ground-glass opacity throughout all lobes of both lungs but especially in the upper lobes bilaterally. Findings are suggestive of early pulmonary edema/heart failure. 3. Hepatic steatosis. 4. Fatty replacement of the pancreas.  Wynonia Musty [facility] Stay [facility]/Anesthesiology Phone [phone] 09/28/2019 10:08 AM

## 2019-09-28 NOTE — Anesthesia Preprocedure Evaluation (Addendum)
Anesthesia Evaluation  Patient identified by MRN, date of birth, ID band Patient awake    Reviewed: Allergy & Precautions, NPO status , Patient's Chart, lab work & pertinent test results  Airway Mallampati: II  TM Distance: >3 FB Neck ROM: Full    Dental no notable dental hx.    Pulmonary neg pulmonary ROS,    Pulmonary exam normal breath sounds clear to auscultation       Cardiovascular hypertension, + Peripheral Vascular Disease  Normal cardiovascular exam Rhythm:Regular Rate:Normal  Aorta: The aortic root, ascending aorta and aortic arch are all  structurally normal, with no evidence of dilitation or obstruction. There  is moderate dilatation of the ascending aorta measuring 49 mm. There is an  aneurysm involving the ascending aorta.    Neuro/Psych negative neurological ROS  negative psych ROS   GI/Hepatic Neg liver ROS, GERD  ,  Endo/Other  Morbid obesity  Renal/GU negative Renal ROS  negative genitourinary   Musculoskeletal negative musculoskeletal ROS (+)   Abdominal   Peds negative pediatric ROS (+)  Hematology negative hematology ROS (+)   Anesthesia Other Findings   Reproductive/Obstetrics negative OB ROS                             Anesthesia Physical Anesthesia Plan  ASA: III  Anesthesia Plan: General   Post-op Pain Management:    Induction: Intravenous  PONV Risk Score and Plan: 2 and Ondansetron, Dexamethasone and Treatment may vary due to age or medical condition  Airway Management Planned: Oral ETT  Additional Equipment:   Intra-op Plan:   Post-operative Plan: Extubation in OR  Informed Consent: I have reviewed the patients History and Physical, chart, labs and discussed the procedure including the risks, benefits and alternatives for the proposed anesthesia with the patient or authorized representative who has indicated his/her understanding and acceptance.      Dental advisory given  Plan Discussed with: CRNA and Surgeon  Anesthesia Plan Comments: ( Follows with cardiology for hx of TAA. Echocardiogram December 2020 showed normal LV function and moderately dilated ascending aorta measuring 49 mm. CTA January 2021 showed ascending thoracic aortic aneurysm measuring 4.5 cm.  Last seen by Dr. Stanford Breed 08/02/19. Per note, plan was to repeat echo in July 2021. Dr. Stanford Breed cleared pt for surgery per telephone encounter 08/25/19 stating, "Patient may proceed with surgery without further cardiac evaluation."  CTA also noted bilateral ground glass opacities. This is being followed by PCP Dr. Suzi Roots. Per note 07/29/19, "Unclear etiology. No sign of active acute heart failure symptoms.  No sign of acute infection." Also discussed he was to be having neck surgery soon.   Follows with pul[facility]ology at [facility] for hx of OSA on CPAP, DOE, and asthma. Spirometry 12/23/18 showed moderate restrictive disease with normal diffusing capacity. 6 minute walk test 06/24/19 with 533ft walked and now desat below 91%. Last seen 06/24/19. Doing well per note, no active wheezing or coughing. Recommended continue Breo and PRN albuterol.   Preop labs reviewed, unremarkable.   EKG 07/07/19: NSR. Rate 76. Low voltage QRS.  TTE 07/13/19: 1. Left ventricular ejection fraction, by visual estimation, is 60 to  65%. The left ventricle has normal function. There is no left ventricular  hypertrophy.  2. Left ventricular diastolic parameters are consistent with Grade I  diastolic dysfunction (impaired relaxation).  3. The left ventricle has no regional wall motion abnormalities.  4. The mitral valve is normal in structure.  No evidence of mitral valve  regurgitation. No evidence of mitral stenosis.  5. The tricuspid valve is normal in structure.  6. The aortic valve is normal in structure. Aortic valve regurgitation is  not visualized. No evidence of aortic valve sclerosis or  stenosis.  7. The pulmonic valve was normal in structure. Pulmonic valve  regurgitation is not visualized.  8. Aneurysm of the ascending aorta.  9. There is moderate dilatation of the ascending aorta measuring 49 mm.   CTA chest 07/27/19: IMPRESSION: 1. Aneurysmal disease of the ascending thoracic aorta measuring 4.5 cm in greatest diameter. The aortic root measures 4.3-4.4 cm. No evidence of aortic dissection. Ascending thoracic aortic aneurysm. Recommend semi-annual imaging followup by CTA or MRA and referral to cardiothoracic surgery if not already obtained. This recommendation follows 2010 ACCF/AHA/AATS/ACR/ASA/SCA/SCAI/SIR/STS/SVM Guidelines for the Diagnosis and Management of Patients With Thoracic Aortic Disease. Circulation. 2010; [id number]. Aortic aneurysm NOS (ICD10-I71.9) 2. New geographic areas of mosaic ground-glass opacity throughout all lobes of both lungs but especially in the upper lobes bilaterally. Findings are suggestive of early pulmonary edema/heart failure. 3. Hepatic steatosis. 4. Fatty replacement of the pancreas.)       Anesthesia Quick Evaluation

## 2019-09-29 ENCOUNTER — Ambulatory Visit (HOSPITAL_COMMUNITY): Payer: Medicare Other | Admitting: Anesthesiology

## 2019-09-29 ENCOUNTER — Ambulatory Visit (HOSPITAL_COMMUNITY): Payer: Medicare Other

## 2019-09-29 ENCOUNTER — Other Ambulatory Visit: Payer: Self-pay

## 2019-09-29 ENCOUNTER — Encounter (HOSPITAL_COMMUNITY): Payer: Self-pay | Admitting: Neurosurgery

## 2019-09-29 ENCOUNTER — Ambulatory Visit (HOSPITAL_COMMUNITY)
Admission: RE | Disposition: A | Discharge status: Home / Self Care | Payer: Self-pay | Source: Home / Self Care | Attending: Neurosurgery

## 2019-09-29 ENCOUNTER — Ambulatory Visit (HOSPITAL_COMMUNITY)
Admission: RE | Admit: 2019-09-29 | Discharge: 2019-09-29 | Disposition: A | Discharge status: Home / Self Care | Payer: Medicare Other | Attending: Neurosurgery | Admitting: Neurosurgery

## 2019-09-29 ENCOUNTER — Ambulatory Visit (HOSPITAL_COMMUNITY): Payer: Medicare Other | Admitting: Physician Assistant

## 2019-09-29 DIAGNOSIS — G4730 Sleep apnea, unspecified: Secondary | ICD-10-CM | POA: Diagnosis not present

## 2019-09-29 DIAGNOSIS — G35 Multiple sclerosis: Secondary | ICD-10-CM | POA: Diagnosis not present

## 2019-09-29 DIAGNOSIS — I10 Essential (primary) hypertension: Secondary | ICD-10-CM | POA: Diagnosis not present

## 2019-09-29 DIAGNOSIS — M4722 Other spondylosis with radiculopathy, cervical region: Secondary | ICD-10-CM | POA: Insufficient documentation

## 2019-09-29 DIAGNOSIS — Z6841 Body Mass Index (BMI) 40.0 and over, adult: Secondary | ICD-10-CM | POA: Diagnosis not present

## 2019-09-29 DIAGNOSIS — G5601 Carpal tunnel syndrome, right upper limb: Secondary | ICD-10-CM | POA: Diagnosis not present

## 2019-09-29 DIAGNOSIS — M50121 Cervical disc disorder at C4-C5 level with radiculopathy: Secondary | ICD-10-CM | POA: Insufficient documentation

## 2019-09-29 DIAGNOSIS — Z419 Encounter for procedure for purposes other than remedying health state, unspecified: Secondary | ICD-10-CM

## 2019-09-29 DIAGNOSIS — F419 Anxiety disorder, unspecified: Secondary | ICD-10-CM | POA: Diagnosis not present

## 2019-09-29 DIAGNOSIS — G8929 Other chronic pain: Secondary | ICD-10-CM | POA: Insufficient documentation

## 2019-09-29 DIAGNOSIS — F329 Major depressive disorder, single episode, unspecified: Secondary | ICD-10-CM | POA: Insufficient documentation

## 2019-09-29 DIAGNOSIS — I739 Peripheral vascular disease, unspecified: Secondary | ICD-10-CM | POA: Insufficient documentation

## 2019-09-29 DIAGNOSIS — K219 Gastro-esophageal reflux disease without esophagitis: Secondary | ICD-10-CM | POA: Diagnosis not present

## 2019-09-29 DIAGNOSIS — M4322 Fusion of spine, cervical region: Secondary | ICD-10-CM | POA: Diagnosis not present

## 2019-09-29 DIAGNOSIS — Z79899 Other long term (current) drug therapy: Secondary | ICD-10-CM | POA: Diagnosis not present

## 2019-09-29 DIAGNOSIS — M5020 Other cervical disc displacement, unspecified cervical region: Secondary | ICD-10-CM | POA: Diagnosis present

## 2019-09-29 HISTORY — PX: ANTERIOR CERVICAL DECOMP/DISCECTOMY FUSION: SHX1161

## 2019-09-29 HISTORY — PX: CARPAL TUNNEL RELEASE: SHX101

## 2019-09-29 SURGERY — ANTERIOR CERVICAL DECOMPRESSION/DISCECTOMY FUSION 1 LEVEL/HARDWARE REMOVAL
Anesthesia: General | Site: Spine Cervical | Laterality: Right

## 2019-09-29 MED ORDER — CYCLOBENZAPRINE HCL 10 MG PO TABS: 10.0000 mg | ORAL_TABLET | Freq: Three times a day (TID) | ORAL | 0 refills | Status: DC | PRN

## 2019-09-29 MED ORDER — PROMETHAZINE HCL 25 MG/ML IJ SOLN: 6.2500 mg | INTRAMUSCULAR | Status: DC | PRN

## 2019-09-29 MED ORDER — PROPOFOL 10 MG/ML IV BOLUS
INTRAVENOUS | Status: AC
  Filled 2019-09-29: qty 40

## 2019-09-29 MED ORDER — THROMBIN 5000 UNITS EX SOLR
CUTANEOUS | Status: DC | PRN
  Administered 2019-09-29 (×2): 5000 [IU] via TOPICAL

## 2019-09-29 MED ORDER — HYDROMORPHONE HCL 1 MG/ML IJ SOLN
INTRAMUSCULAR | Status: AC
  Filled 2019-09-29: qty 1

## 2019-09-29 MED ORDER — HYDROCODONE-ACETAMINOPHEN 10-325 MG PO TABS: 2.0000 | ORAL_TABLET | ORAL | Status: DC | PRN

## 2019-09-29 MED ORDER — DEXAMETHASONE SODIUM PHOSPHATE 10 MG/ML IJ SOLN
10.0000 mg | Freq: Once | INTRAMUSCULAR | Status: AC
  Administered 2019-09-29: 10 mg via INTRAVENOUS

## 2019-09-29 MED ORDER — FENTANYL CITRATE (PF) 250 MCG/5ML IJ SOLN
INTRAMUSCULAR | Status: AC
  Filled 2019-09-29: qty 5

## 2019-09-29 MED ORDER — DULOXETINE HCL 60 MG PO CPEP
60.0000 mg | ORAL_CAPSULE | Freq: Two times a day (BID) | ORAL | Status: DC
  Filled 2019-09-29: qty 1

## 2019-09-29 MED ORDER — SODIUM CHLORIDE 0.9 % IV SOLN
250.0000 mL | INTRAVENOUS | Status: DC
  Administered 2019-09-29: 250 mL via INTRAVENOUS

## 2019-09-29 MED ORDER — LIDOCAINE-EPINEPHRINE 1 %-1:100000 IJ SOLN
INTRAMUSCULAR | Status: AC
  Filled 2019-09-29: qty 1

## 2019-09-29 MED ORDER — GLYCOPYRROLATE PF 0.2 MG/ML IJ SOSY
PREFILLED_SYRINGE | INTRAMUSCULAR | Status: AC
  Filled 2019-09-29: qty 1

## 2019-09-29 MED ORDER — 0.9 % SODIUM CHLORIDE (POUR BTL) OPTIME
TOPICAL | Status: DC | PRN
  Administered 2019-09-29 (×2): 1000 mL

## 2019-09-29 MED ORDER — ONDANSETRON HCL 4 MG/2ML IJ SOLN: 4.0000 mg | Freq: Four times a day (QID) | INTRAMUSCULAR | Status: DC | PRN

## 2019-09-29 MED ORDER — IBUPROFEN 800 MG PO TABS: 800.0000 mg | ORAL_TABLET | Freq: Three times a day (TID) | ORAL | Status: DC | PRN

## 2019-09-29 MED ORDER — EPHEDRINE SULFATE 50 MG/ML IJ SOLN
INTRAMUSCULAR | Status: DC | PRN
  Administered 2019-09-29: 10 mg via INTRAVENOUS
  Administered 2019-09-29 (×4): 5 mg via INTRAVENOUS
  Administered 2019-09-29: 10 mg via INTRAVENOUS

## 2019-09-29 MED ORDER — SUGAMMADEX SODIUM 200 MG/2ML IV SOLN
INTRAVENOUS | Status: DC | PRN
  Administered 2019-09-29: 300 mg via INTRAVENOUS

## 2019-09-29 MED ORDER — ACETAMINOPHEN 325 MG PO TABS: 650.0000 mg | ORAL_TABLET | ORAL | Status: DC | PRN

## 2019-09-29 MED ORDER — MENTHOL 3 MG MT LOZG: 1.0000 | LOZENGE | OROMUCOSAL | Status: DC | PRN

## 2019-09-29 MED ORDER — PANTOPRAZOLE SODIUM 40 MG IV SOLR: 40.0000 mg | Freq: Every day | INTRAVENOUS | Status: DC

## 2019-09-29 MED ORDER — ONDANSETRON HCL 4 MG/2ML IJ SOLN
INTRAMUSCULAR | Status: AC
  Filled 2019-09-29: qty 2

## 2019-09-29 MED ORDER — MIDAZOLAM HCL 2 MG/2ML IJ SOLN
INTRAMUSCULAR | Status: AC
  Filled 2019-09-29: qty 2

## 2019-09-29 MED ORDER — LOSARTAN POTASSIUM 50 MG PO TABS: 50.0000 mg | ORAL_TABLET | Freq: Every day | ORAL | Status: DC

## 2019-09-29 MED ORDER — GLYCOPYRROLATE PF 0.2 MG/ML IJ SOSY
PREFILLED_SYRINGE | INTRAMUSCULAR | Status: AC
  Filled 2019-09-29: qty 2

## 2019-09-29 MED ORDER — LOSARTAN POTASSIUM-HCTZ 50-12.5 MG PO TABS: 1.0000 | ORAL_TABLET | Freq: Every day | ORAL | Status: DC

## 2019-09-29 MED ORDER — GABAPENTIN 400 MG PO CAPS
800.0000 mg | ORAL_CAPSULE | Freq: Four times a day (QID) | ORAL | Status: DC
  Administered 2019-09-29: 800 mg via ORAL
  Filled 2019-09-29: qty 2

## 2019-09-29 MED ORDER — HYDROMORPHONE HCL 1 MG/ML IJ SOLN: 0.5000 mg | INTRAMUSCULAR | Status: DC | PRN

## 2019-09-29 MED ORDER — LIDOCAINE 2% (20 MG/ML) 5 ML SYRINGE
INTRAMUSCULAR | Status: AC
  Filled 2019-09-29: qty 10

## 2019-09-29 MED ORDER — GLYCOPYRROLATE 0.2 MG/ML IJ SOLN
INTRAMUSCULAR | Status: DC | PRN
  Administered 2019-09-29 (×2): .2 mg via INTRAVENOUS

## 2019-09-29 MED ORDER — LIDOCAINE-EPINEPHRINE 1 %-1:100000 IJ SOLN
INTRAMUSCULAR | Status: DC | PRN
  Administered 2019-09-29: 10 mL

## 2019-09-29 MED ORDER — ONDANSETRON HCL 4 MG/2ML IJ SOLN
INTRAMUSCULAR | Status: DC | PRN
  Administered 2019-09-29: 4 mg via INTRAVENOUS

## 2019-09-29 MED ORDER — ALUM & MAG HYDROXIDE-SIMETH 200-200-20 MG/5ML PO SUSP: 30.0000 mL | Freq: Four times a day (QID) | ORAL | Status: DC | PRN

## 2019-09-29 MED ORDER — PANTOPRAZOLE SODIUM 40 MG PO TBEC: 40.0000 mg | DELAYED_RELEASE_TABLET | Freq: Every day | ORAL | Status: DC

## 2019-09-29 MED ORDER — HYDROCODONE-ACETAMINOPHEN 10-325 MG PO TABS: 2.0000 | ORAL_TABLET | ORAL | 0 refills | Status: DC | PRN

## 2019-09-29 MED ORDER — PROPOFOL 10 MG/ML IV BOLUS
INTRAVENOUS | Status: DC | PRN
  Administered 2019-09-29: 200 mg via INTRAVENOUS

## 2019-09-29 MED ORDER — HEMOSTATIC AGENTS (NO CHARGE) OPTIME
TOPICAL | Status: DC | PRN
  Administered 2019-09-29: 1

## 2019-09-29 MED ORDER — DEXAMETHASONE SODIUM PHOSPHATE 10 MG/ML IJ SOLN
INTRAMUSCULAR | Status: AC
  Filled 2019-09-29: qty 1

## 2019-09-29 MED ORDER — HYDROMORPHONE HCL 1 MG/ML IJ SOLN
0.2500 mg | INTRAMUSCULAR | Status: DC | PRN
  Administered 2019-09-29 (×2): 0.5 mg via INTRAVENOUS

## 2019-09-29 MED ORDER — EPHEDRINE 5 MG/ML INJ
INTRAVENOUS | Status: AC
  Filled 2019-09-29: qty 10

## 2019-09-29 MED ORDER — CYCLOBENZAPRINE HCL 10 MG PO TABS: 10.0000 mg | ORAL_TABLET | Freq: Three times a day (TID) | ORAL | Status: DC | PRN

## 2019-09-29 MED ORDER — CHLORHEXIDINE GLUCONATE CLOTH 2 % EX PADS: 6.0000 | MEDICATED_PAD | Freq: Once | CUTANEOUS | Status: DC

## 2019-09-29 MED ORDER — ROCURONIUM BROMIDE 10 MG/ML (PF) SYRINGE
PREFILLED_SYRINGE | INTRAVENOUS | Status: DC | PRN
  Administered 2019-09-29: 10 mg via INTRAVENOUS
  Administered 2019-09-29: 100 mg via INTRAVENOUS

## 2019-09-29 MED ORDER — LORATADINE 10 MG PO TABS: 10.0000 mg | ORAL_TABLET | Freq: Every day | ORAL | Status: DC

## 2019-09-29 MED ORDER — SODIUM CHLORIDE 0.9% FLUSH: 3.0000 mL | INTRAVENOUS | Status: DC | PRN

## 2019-09-29 MED ORDER — TERIFLUNOMIDE 14 MG PO TABS: 14.0000 mg | ORAL_TABLET | Freq: Every day | ORAL | Status: DC

## 2019-09-29 MED ORDER — CEFAZOLIN SODIUM-DEXTROSE 2-4 GM/100ML-% IV SOLN
2.0000 g | Freq: Three times a day (TID) | INTRAVENOUS | Status: DC
  Administered 2019-09-29: 2 g via INTRAVENOUS
  Filled 2019-09-29: qty 100

## 2019-09-29 MED ORDER — AMPHETAMINE-DEXTROAMPHET ER 30 MG PO CP24: 30.0000 mg | ORAL_CAPSULE | Freq: Every day | ORAL | Status: DC

## 2019-09-29 MED ORDER — TIZANIDINE HCL 4 MG PO TABS: 4.0000 mg | ORAL_TABLET | Freq: Four times a day (QID) | ORAL | Status: DC | PRN

## 2019-09-29 MED ORDER — THROMBIN 5000 UNITS EX SOLR
CUTANEOUS | Status: AC
  Filled 2019-09-29: qty 15000

## 2019-09-29 MED ORDER — LACTATED RINGERS IV SOLN: INTRAVENOUS | Status: DC | PRN

## 2019-09-29 MED ORDER — SUCCINYLCHOLINE CHLORIDE 200 MG/10ML IV SOSY
PREFILLED_SYRINGE | INTRAVENOUS | Status: AC
  Filled 2019-09-29: qty 20

## 2019-09-29 MED ORDER — HYDROCHLOROTHIAZIDE 12.5 MG PO CAPS: 12.5000 mg | ORAL_CAPSULE | Freq: Every day | ORAL | Status: DC

## 2019-09-29 MED ORDER — PHENYLEPHRINE HCL-NACL 10-0.9 MG/250ML-% IV SOLN
INTRAVENOUS | Status: DC | PRN
  Administered 2019-09-29: 25 ug/min via INTRAVENOUS

## 2019-09-29 MED ORDER — SODIUM CHLORIDE 0.9% FLUSH
3.0000 mL | Freq: Two times a day (BID) | INTRAVENOUS | Status: DC
  Administered 2019-09-29: 3 mL via INTRAVENOUS

## 2019-09-29 MED ORDER — AMANTADINE HCL 100 MG PO CAPS
100.0000 mg | ORAL_CAPSULE | Freq: Three times a day (TID) | ORAL | Status: DC
  Administered 2019-09-29: 100 mg via ORAL
  Filled 2019-09-29 (×2): qty 1

## 2019-09-29 MED ORDER — HYDROCODONE-ACETAMINOPHEN 10-325 MG PO TABS
2.0000 | ORAL_TABLET | ORAL | Status: DC | PRN
  Administered 2019-09-29: 17:00:00 2 via ORAL
  Filled 2019-09-29: qty 2

## 2019-09-29 MED ORDER — SODIUM CHLORIDE 0.9 % IV SOLN
INTRAVENOUS | Status: DC | PRN
  Administered 2019-09-29: 500 mL

## 2019-09-29 MED ORDER — ALBUTEROL SULFATE HFA 108 (90 BASE) MCG/ACT IN AERS: 1.0000 | INHALATION_SPRAY | Freq: Four times a day (QID) | RESPIRATORY_TRACT | Status: DC | PRN

## 2019-09-29 MED ORDER — MIDAZOLAM HCL 5 MG/5ML IJ SOLN
INTRAMUSCULAR | Status: DC | PRN
  Administered 2019-09-29: 2 mg via INTRAVENOUS

## 2019-09-29 MED ORDER — FENTANYL CITRATE (PF) 250 MCG/5ML IJ SOLN
INTRAMUSCULAR | Status: DC | PRN
  Administered 2019-09-29: 150 ug via INTRAVENOUS
  Administered 2019-09-29: 100 ug via INTRAVENOUS

## 2019-09-29 MED ORDER — PHENOL 1.4 % MT LIQD: 1.0000 | OROMUCOSAL | Status: DC | PRN

## 2019-09-29 MED ORDER — THROMBIN 5000 UNITS EX SOLR
OROMUCOSAL | Status: DC | PRN
  Administered 2019-09-29: 5 mL

## 2019-09-29 MED ORDER — FLUTICASONE FUROATE-VILANTEROL 100-25 MCG/INH IN AEPB: 1.0000 | INHALATION_SPRAY | Freq: Every day | RESPIRATORY_TRACT | Status: DC | PRN

## 2019-09-29 MED ORDER — SUCCINYLCHOLINE CHLORIDE 20 MG/ML IJ SOLN
INTRAMUSCULAR | Status: DC | PRN
  Administered 2019-09-29: 140 mg via INTRAVENOUS

## 2019-09-29 MED ORDER — ROCURONIUM BROMIDE 10 MG/ML (PF) SYRINGE
PREFILLED_SYRINGE | INTRAVENOUS | Status: AC
  Filled 2019-09-29: qty 30

## 2019-09-29 MED ORDER — LIDOCAINE 2% (20 MG/ML) 5 ML SYRINGE
INTRAMUSCULAR | Status: DC | PRN
  Administered 2019-09-29: 100 mg via INTRAVENOUS

## 2019-09-29 MED ORDER — ONDANSETRON HCL 4 MG PO TABS: 4.0000 mg | ORAL_TABLET | Freq: Four times a day (QID) | ORAL | Status: DC | PRN

## 2019-09-29 MED ORDER — ACETAMINOPHEN 650 MG RE SUPP: 650.0000 mg | RECTAL | Status: DC | PRN

## 2019-09-29 SURGICAL SUPPLY — 66 items
BAG DECANTER FOR FLEXI CONT (MISCELLANEOUS) ×3 IMPLANT
BASKET BONE COLLECTION (BASKET) ×3 IMPLANT
BENZOIN TINCTURE PRP APPL 2/3 (GAUZE/BANDAGES/DRESSINGS) ×3 IMPLANT
BIT DRILL NEURO 2X3.1 SFT TUCH (MISCELLANEOUS) ×2 IMPLANT
BNDG ELASTIC 3X5.8 VLCR STR LF (GAUZE/BANDAGES/DRESSINGS) ×1 IMPLANT
BNDG GAUZE ELAST 4 BULKY (GAUZE/BANDAGES/DRESSINGS) ×1 IMPLANT
BONE VIVIGEN FORMABLE 1.3CC (Bone Implant) ×3 IMPLANT
BUR MATCHSTICK NEURO 3.0 LAGG (BURR) ×3 IMPLANT
CANISTER SUCT 3000ML PPV (MISCELLANEOUS) ×3 IMPLANT
CARTRIDGE OIL MAESTRO DRILL (MISCELLANEOUS) ×2 IMPLANT
COVER WAND RF STERILE (DRAPES) ×3 IMPLANT
DECANTER SPIKE VIAL GLASS SM (MISCELLANEOUS) ×3 IMPLANT
DERMABOND ADVANCED (GAUZE/BANDAGES/DRESSINGS) ×1
DERMABOND ADVANCED .7 DNX12 (GAUZE/BANDAGES/DRESSINGS) IMPLANT
DIFFUSER DRILL AIR PNEUMATIC (MISCELLANEOUS) ×3 IMPLANT
DRAPE C-ARM 42X72 X-RAY (DRAPES) ×6 IMPLANT
DRAPE LAPAROTOMY 100X72 PEDS (DRAPES) ×3 IMPLANT
DRAPE MICROSCOPE LEICA (MISCELLANEOUS) ×3 IMPLANT
DRILL NEURO 2X3.1 SOFT TOUCH (MISCELLANEOUS) ×3
DRSG ADAPTIC 3X8 NADH LF (GAUZE/BANDAGES/DRESSINGS) ×1 IMPLANT
DRSG OPSITE POSTOP 4X6 (GAUZE/BANDAGES/DRESSINGS) ×1 IMPLANT
DURAPREP 6ML APPLICATOR 50/CS (WOUND CARE) ×3 IMPLANT
ELECT COATED BLADE 2.86 ST (ELECTRODE) ×3 IMPLANT
ELECT REM PT RETURN 9FT ADLT (ELECTROSURGICAL) ×3
ELECTRODE REM PT RTRN 9FT ADLT (ELECTROSURGICAL) ×2 IMPLANT
GAUZE 4X4 16PLY RFD (DISPOSABLE) IMPLANT
GAUZE SPONGE 4X4 12PLY STRL (GAUZE/BANDAGES/DRESSINGS) ×3 IMPLANT
GLOVE BIO SURGEON STRL SZ7 (GLOVE) IMPLANT
GLOVE BIO SURGEON STRL SZ8 (GLOVE) ×3 IMPLANT
GLOVE BIOGEL PI IND STRL 7.0 (GLOVE) IMPLANT
GLOVE BIOGEL PI INDICATOR 7.0 (GLOVE)
GLOVE EXAM NITRILE XL STR (GLOVE) IMPLANT
GLOVE INDICATOR 8.5 STRL (GLOVE) ×3 IMPLANT
GOWN STRL REUS W/ TWL LRG LVL3 (GOWN DISPOSABLE) ×2 IMPLANT
GOWN STRL REUS W/ TWL XL LVL3 (GOWN DISPOSABLE) ×2 IMPLANT
GOWN STRL REUS W/TWL 2XL LVL3 (GOWN DISPOSABLE) ×3 IMPLANT
GOWN STRL REUS W/TWL LRG LVL3 (GOWN DISPOSABLE) ×3
GOWN STRL REUS W/TWL XL LVL3 (GOWN DISPOSABLE) ×3
GRAFT BNE MATRIX VG FRMBL SM 1 (Bone Implant) IMPLANT
HALTER HD/CHIN CERV TRACTION D (MISCELLANEOUS) ×3 IMPLANT
HEMOSTAT POWDER KIT SURGIFOAM (HEMOSTASIS) ×3 IMPLANT
KIT BASIN OR (CUSTOM PROCEDURE TRAY) ×3 IMPLANT
KIT TURNOVER KIT B (KITS) ×3 IMPLANT
NDL HYPO 18GX1.5 BLUNT FILL (NEEDLE) ×2 IMPLANT
NDL SPNL 20GX3.5 QUINCKE YW (NEEDLE) ×2 IMPLANT
NEEDLE HYPO 18GX1.5 BLUNT FILL (NEEDLE) ×3 IMPLANT
NEEDLE SPNL 20GX3.5 QUINCKE YW (NEEDLE) ×3 IMPLANT
NS IRRIG 1000ML POUR BTL (IV SOLUTION) ×3 IMPLANT
OIL CARTRIDGE MAESTRO DRILL (MISCELLANEOUS) ×3
PACK LAMINECTOMY NEURO (CUSTOM PROCEDURE TRAY) ×3 IMPLANT
PAD ARMBOARD 7.5X6 YLW CONV (MISCELLANEOUS) ×9 IMPLANT
PIN DISTRACTION 14MM (PIN) IMPLANT
PLATE ANT CERV XTEND 1 LV 14 (Plate) ×1 IMPLANT
RUBBERBAND STERILE (MISCELLANEOUS) ×6 IMPLANT
SCREW VAR 4.2 XD SELF DRILL 14 (Screw) ×4 IMPLANT
SPACER ACDF TPS LG PARALLEL 7 (Spacer) ×1 IMPLANT
SPONGE INTESTINAL PEANUT (DISPOSABLE) ×3 IMPLANT
SPONGE SURGIFOAM ABS GEL SZ50 (HEMOSTASIS) ×3 IMPLANT
STRIP CLOSURE SKIN 1/2X4 (GAUZE/BANDAGES/DRESSINGS) ×3 IMPLANT
SUT ETHILON 3 0 FSL (SUTURE) ×2 IMPLANT
SUT VIC AB 3-0 SH 8-18 (SUTURE) ×3 IMPLANT
SUT VICRYL 4-0 PS2 18IN ABS (SUTURE) ×3 IMPLANT
TAPE CLOTH 4X10 WHT NS (GAUZE/BANDAGES/DRESSINGS) IMPLANT
TOWEL GREEN STERILE (TOWEL DISPOSABLE) ×3 IMPLANT
TOWEL GREEN STERILE FF (TOWEL DISPOSABLE) ×3 IMPLANT
WATER STERILE IRR 1000ML POUR (IV SOLUTION) ×3 IMPLANT

## 2019-09-29 NOTE — Discharge Summary (Signed)
Physician Discharge Summary  Patient ID: Mark [name]. MRN: [id number] DOB/AGE: 10-06-1960 59 y.o.  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 The patient tolerated the procedure well and was taken to the recovery room and then to the floor in stable condition. The hospital course was routine. There were no complications. The wound remained clean dry and intact. Pt had appropriate neck soreness. No complaints of arm pain or new N/T/W. The patient remained afebrile with stable vital signs, and tolerated a regular diet. The patient continued to increase activities, and pain was well controlled with oral pain medications.   Consults: None  Significant Diagnostic Studies:  Results for orders placed or performed during the hospital encounter of 09/27/19  SARS CORONAVIRUS 2 (TAT 6-24 HRS) Nasopharyngeal Nasopharyngeal Swab   Specimen: Nasopharyngeal Swab  Result Value Ref Range   SARS Coronavirus 2 NEGATIVE NEGATIVE    DG Cervical Spine 1 View  Result Date: 09/29/2019 CLINICAL DATA:  C4-5 ACDF EXAM: DG CERVICAL SPINE - 1 VIEW; DG C-ARM 1-60 MIN COMPARISON:  MR cervical spine dated 07/08/2019 FLUOROSCOPY TIME:  7 seconds FINDINGS: Single lateral fluoroscopic image demonstrating C4-5 ACDF with interbody spacer in satisfactory position. Prior C5-7 ACDF, incompletely visualized. IMPRESSION: Intraoperative fluoroscopic images during C4-5 ACDF, as above. Electronically Signed   By: Julian Hy M.D.   On: 09/29/2019 10:50   DG C-Arm 1-60 Min  Result Date: 09/29/2019 CLINICAL DATA:  C4-5 ACDF EXAM: DG CERVICAL SPINE - 1 VIEW; DG C-ARM 1-60 MIN COMPARISON:  MR cervical  spine dated 07/08/2019 FLUOROSCOPY TIME:  7 seconds FINDINGS: Single lateral fluoroscopic image demonstrating C4-5 ACDF with interbody spacer in satisfactory position. Prior C5-7 ACDF, incompletely visualized. IMPRESSION: Intraoperative fluoroscopic images during C4-5 ACDF, as above. Electronically Signed   By: Julian Hy M.D.   On: 09/29/2019 10:50    Antibiotics:  Anti-infectives (From admission, onward)   Start     Dose/Rate Route Frequency Ordered Stop   09/29/19 1600  ceFAZolin (ANCEF) IVPB 2g/100 mL premix     2 g 200 mL/hr over 30 Minutes Intravenous Every 8 hours 09/29/19 1333 09/30/19 0759   09/29/19 0914  bacitracin 50,000 Units in sodium chloride 0.9 % 500 mL irrigation  Status:  Discontinued       As needed 09/29/19 0914 09/29/19 1114   09/29/19 0730  ceFAZolin (ANCEF) 3 g in dextrose 5 % 50 mL IVPB     3 g 100 mL/hr over 30 Minutes Intravenous To [facility] 09/28/19 0919 09/29/19 0841      Discharge Exam: Blood pressure (!) 142/94, pulse 78, temperature 97.8 F (36.6 C), temperature source Oral, resp. rate 18, height 6\' 1"  (1.854 m), weight (!) 139.8 kg, SpO2 95 %. Neurologic: Grossly normal Ambulating and voiding well, incision cdi  Discharge Medications:   Allergies as of 09/29/2019   No Known Allergies     Medication List    TAKE these medications   amantadine 100 MG capsule Commonly known as: SYMMETREL Take 100 mg by mouth 3 (three) times daily.   amphetamine-dextroamphetamine 30 MG 24 hr capsule Commonly known as: ADDERALL XR Take 30 mg by mouth daily.   Aubagio 14 MG Tabs Generic drug: Teriflunomide Take 14 mg  by mouth at bedtime.   Breo Ellipta 100-25 MCG/INH Aepb Generic drug: fluticasone furoate-vilanterol Inhale 1 puff into the lungs daily as needed (shortness of breath).   cyclobenzaprine 10 MG tablet Commonly known as: FLEXERIL Take 1 tablet (10 mg total) by mouth 3 (three) times daily as needed for muscle spasms.   DULoxetine  60 MG capsule Commonly known as: CYMBALTA Take 60 mg by mouth 2 (two) times daily.   gabapentin 800 MG tablet Commonly known as: NEURONTIN Take 800 mg by mouth 4 (four) times daily.   HYDROcodone-acetaminophen 10-325 MG tablet Commonly known as: NORCO Take 2 tablets by mouth every 4 (four) hours as needed for severe pain.   ibuprofen 800 MG tablet Commonly known as: ADVIL Take 800 mg by mouth every 8 (eight) hours as needed for moderate pain.   loratadine 10 MG tablet Commonly known as: CLARITIN Take 10 mg by mouth at bedtime.   losartan-hydrochlorothiazide 50-12.5 MG tablet Commonly known as: HYZAAR TAKE ONE TABLET BY MOUTH EVERY DAY   omeprazole 20 MG capsule Commonly known as: PRILOSEC Take 20 mg by mouth daily.   tadalafil 20 MG tablet Commonly known as: CIALIS Take 20 mg by mouth daily as needed for erectile dysfunction.   tiZANidine 4 MG tablet Commonly known as: ZANAFLEX Take 4 mg by mouth every 6 (six) hours as needed for muscle spasms.   Ventolin HFA 108 (90 Base) MCG/ACT inhaler Generic drug: albuterol Inhale 1 puff into the lungs every 6 (six) hours as needed for wheezing or shortness of breath.       Disposition: home   Final Dx: ACDF C4-C5   Signed: Ocie Cornfield Cerys Winget 09/29/2019, 3:18 PM ]

## 2019-09-29 NOTE — Anesthesia Postprocedure Evaluation (Signed)
Anesthesia Post Note  Patient: Mark Fick Eguia Jr.  Procedure(s) Performed: Anterior Cervical Decompression Fusion - Cervical Four-Cervical Five (N/A Spine Cervical) Carpal Tunnel Release (Right Hand)     Patient location during evaluation: PACU Anesthesia Type: General Level of consciousness: awake and alert Pain management: pain level controlled Vital Signs Assessment: post-procedure vital signs reviewed and stable Respiratory status: spontaneous breathing, nonlabored ventilation, respiratory function stable and patient connected to nasal cannula oxygen Cardiovascular status: blood pressure returned to baseline and stable Postop Assessment: no apparent nausea or vomiting Anesthetic complications: no    Last Vitals:  Vitals:   09/29/19 1200 09/29/19 1205  BP:  (!) 141/86  Pulse: 79 76  Resp: 19 (!) 21  Temp:    SpO2: 95% 95%    Last Pain:  Vitals:   09/29/19 1150  TempSrc:   PainSc: 0-No pain                 Anda Sobotta S

## 2019-09-29 NOTE — Plan of Care (Signed)
Adequately  appropriate

## 2019-09-29 NOTE — Transfer of Care (Signed)
Immediate Anesthesia Transfer of Care Note  Patient: Mark Fingerhut Beevers Jr.  Procedure(s) Performed: Anterior Cervical Decompression Fusion - Cervical Four-Cervical Five (N/A Spine Cervical) Carpal Tunnel Release (Right Hand)  Patient Location: PACU  Anesthesia Type:General  Level of Consciousness: awake, alert  and oriented  Airway & Oxygen Therapy: Patient Spontanous Breathing and Patient connected to nasal cannula oxygen  Post-op Assessment: Report given to RN, Post -op Vital signs reviewed and stable and Patient moving all extremities X 4  Post vital signs: Reviewed and stable  Last Vitals:  Vitals Value Taken Time  BP 141/87 09/29/19 1119  Temp    Pulse 88 09/29/19 1119  Resp 17 09/29/19 1119  SpO2 95 % 09/29/19 1119  Vitals shown include unvalidated device data.  Last Pain:  Vitals:   09/29/19 0706  TempSrc: Oral  PainSc: 8       Patients Stated Pain Goal: 3 ([id number])  Complications: No apparent anesthesia complications

## 2019-09-29 NOTE — Anesthesia Procedure Notes (Signed)
Procedure Name: Intubation Date/Time: 09/29/2019 8:39 AM Performed by: Mariea Clonts, CRNA Pre-anesthesia Checklist: Patient identified, Emergency Drugs available, Suction available and Patient being monitored Patient Re-evaluated:Patient Re-evaluated prior to induction Oxygen Delivery Method: Circle System Utilized Preoxygenation: Pre-oxygenation with 100% oxygen Induction Type: IV induction Ventilation: Mask ventilation without difficulty and Oral airway inserted - appropriate to patient size Laryngoscope Size: Glidescope and 4 Grade View: Grade I Tube type: Oral Tube size: 8.0 mm Number of attempts: 1 Airway Equipment and Method: Stylet and Oral airway Placement Confirmation: ETT inserted through vocal cords under direct vision,  positive ETCO2 and breath sounds checked- equal and bilateral Tube secured with: Tape Dental Injury: Teeth and Oropharynx as per pre-operative assessment

## 2019-09-29 NOTE — Progress Notes (Signed)
Patient discharged to home . In no acute distress at the time of discharge.

## 2019-09-29 NOTE — H&P (Signed)
Mark [name]. is an 59 y.o. male.   Chief Complaint: Neck right arm pain HPI: 59 year old gentleman with longstanding neck pain and back pain underwent previous C5-C7 ACDF has had progressive worsening numbness tingling pain into the shoulder but also signs symptoms of carpal tunnel syndrome.  Work-up revealed a large disc herniation at C4-5 above the level of his previous fusion.  In addition EMG documented severe median nerve trapping of the wrist.  Due to patient progression of clinical syndrome imaging findings failed conservative treatment I recommended an ACDF at C4-5 possible removal of hardware C5-C7 and right carpal tunnel release.  I extensively gone over the risks and benefits of both procedures with him as well as perioperative course expectations of outcome and alternatives of surgery and he understood and agreed to proceed forward.  Past Medical History:  Diagnosis Date  . Anxiety    chronic pain treated with narcotics  . Arthritis   . Chronic pain   . Constipation due to opioid therapy   . Depression   . Dysphonia    thoracic left sided"impingement" with heminumbness  . Dyspnea   . GI bleed    abt 15 years ago  . Morbid obesity ([place])   . MS (multiple sclerosis) ([place]) 02/11/2013   [name]  . Pneumonia   . Sleep apnea with use of continuous positive airway pressure (CPAP) 02/11/2013   07-13-12 AHi of 98.6 /hr titrated to 11 cm water ,  average user time 4 hours and 4 minutes. Residual AHI 1.20 December 2012 .  Marland Kitchen Umbilical hernia     Past Surgical History:  Procedure Laterality Date  . CARPAL TUNNEL RELEASE Right    [name]  . COLONOSCOPY W/ POLYPECTOMY    . ESOPHAGOGASTRODUODENOSCOPY    . MULTIPLE TOOTH EXTRACTIONS    . POSTERIOR LUMBAR FUSION  04/17/15   [name], also removed old hardware  . SPINE SURGERY     total of six vertebras fused, three lumbar surgeries and one anterior neck fusion  . TONSILLECTOMY      Family History  Problem Relation  Age of Onset  . Arrhythmia Mother   . Cancer Other   . Cancer Other   . Sleep walking Son    Social History:  reports that he has never smoked. He has never used smokeless tobacco. He reports current alcohol use. He reports that he does not use drugs.  Allergies: No Known Allergies  Medications Prior to Admission  Medication Sig Dispense Refill  . amantadine (SYMMETREL) 100 MG capsule Take 100 mg by mouth 3 (three) times daily.     Marland Kitchen amphetamine-dextroamphetamine (ADDERALL XR) 30 MG 24 hr capsule Take 30 mg by mouth daily.     . DULoxetine (CYMBALTA) 60 MG capsule Take 60 mg by mouth 2 (two) times daily.     Marland Kitchen gabapentin (NEURONTIN) 800 MG tablet Take 800 mg by mouth 4 (four) times daily.  11  . HYDROcodone-acetaminophen (NORCO) 10-325 MG per tablet Take 2 tablets by mouth every 4 (four) hours as needed for severe pain.     Marland Kitchen ibuprofen (ADVIL,MOTRIN) 800 MG tablet Take 800 mg by mouth every 8 (eight) hours as needed for moderate pain.    Marland Kitchen losartan-hydrochlorothiazide (HYZAAR) 50-12.5 MG tablet TAKE ONE TABLET BY MOUTH EVERY DAY (Patient taking differently: Take 1 tablet by mouth daily. ) 90 tablet 1  . omeprazole (PRILOSEC) 20 MG capsule Take 20 mg by mouth daily.    Marland Kitchen  Teriflunomide (AUBAGIO) 14 MG TABS Take 14 mg by mouth at bedtime.     . fluticasone furoate-vilanterol (BREO ELLIPTA) 100-25 MCG/INH AEPB Inhale 1 puff into the lungs daily as needed (shortness of breath).     . loratadine (CLARITIN) 10 MG tablet Take 10 mg by mouth at bedtime.    . tadalafil (CIALIS) 20 MG tablet Take 20 mg by mouth daily as needed for erectile dysfunction.    Marland Kitchen tiZANidine (ZANAFLEX) 4 MG tablet Take 4 mg by mouth every 6 (six) hours as needed for muscle spasms.     . VENTOLIN HFA 108 (90 Base) MCG/ACT inhaler Inhale 1 puff into the lungs every 6 (six) hours as needed for wheezing or shortness of breath. 18 g prn    Results for orders placed or performed during the hospital encounter of 09/27/19 (from  the past 48 hour(s))  SARS CORONAVIRUS 2 (TAT 6-24 HRS) Nasopharyngeal Nasopharyngeal Swab     Status: None   Collection Time: 09/27/19 12:11 PM   Specimen: Nasopharyngeal Swab  Result Value Ref Range   SARS Coronavirus 2 NEGATIVE NEGATIVE    Comment: (NOTE) SARS-CoV-2 target nucleic acids are NOT DETECTED. The SARS-CoV-2 RNA is generally detectable in upper and lower respiratory specimens during the acute phase of infection. Negative results do not preclude SARS-CoV-2 infection, do not rule out co-infections with other pathogens, and should not be used as the sole basis for treatment or other patient management decisions. Negative results must be combined with clinical observations, patient history, and epidemiological information. The expected result is Negative. Fact Sheet for Patients: SugarRoll.be Fact Sheet for Healthcare Providers: https://www.woods-mathews.com/ This test is not yet approved or cleared by the [place] FDA and  has been authorized for detection and/or diagnosis of SARS-CoV-2 by FDA under an Emergency Use Authorization (EUA). This EUA will remain  in effect (meaning this test can be used) for the duration of the COVID-19 declaration under Section 56 4(b)(1) of the Act, 21 U.S.C. section 360bbb-3(b)(1), unless the authorization is terminated or revoked sooner. Performed at [facility], [place] [address], [place],  [address]    No results found.  Review of Systems  Musculoskeletal: Positive for neck pain.  Neurological: Positive for light-headedness.    Blood pressure (!) 148/90, pulse 65, temperature 98.3 F (36.8 C), temperature source Oral, resp. rate 17, height 6\' 1"  (1.854 m), weight (!) 139.8 kg, SpO2 97 %. Physical Exam  Constitutional: He appears well-developed.  HENT:  Head: Normocephalic.  Eyes: Pupils are equal, round, and reactive to light.  Cardiovascular: Normal rate.  Respiratory:  Effort normal.  GI: Soft.  Musculoskeletal:        General: Normal range of motion.     Cervical back: Normal range of motion.  Neurological: He is alert. He has normal strength. GCS eye subscore is 4. GCS verbal subscore is 5. GCS motor subscore is 6.  Strength is 5-5 deltoid, bicep, tricep, wrist flexion, wrist extension, hand intrinsics.  Skin: Skin is warm.     Assessment/Plan 59 year old gentleman presents for ACDF C4-5 right carpal tunnel syndrome release  Addysyn Fern P, MD 09/29/2019, 8:12 AM

## 2019-09-29 NOTE — Progress Notes (Signed)
Orthopedic Tech Progress Note Patient Details:  Larron Ernzen. 03-03-1961 [id number] PACU RN called requesting SOFT COLLAR for patient Ortho Devices Type of Ortho Device: Soft collar Ortho Device/Splint Location: NECK Ortho Device/Splint Interventions: Application   Post Interventions Patient Tolerated: Well Instructions Provided: Care of device, Adjustment of device   Janit Pagan 09/29/2019, 2:05 PM

## 2019-09-29 NOTE — Discharge Instructions (Signed)
Wound Care  Keep the incision clean and dry remove the outer dressing in 2 days, leave the Steri-Strips intact.  Do not put any creams, lotions, or ointments on incision. Leave steri-strips on neck.  They will fall off by themselves.  Activity Walk each and every day, increasing distance each day. No lifting greater than 5 lbs.  Avoid excessive neck motion. No lifting no bending no twisting no driving or riding a car unless coming back and forth to see me. Wear neck brace at all times except when showering.   Diet Resume your normal diet.   Return to Work Will be discussed at you follow up appointment.  Call Your Doctor If Any of These Occur Redness, drainage, or swelling at the wound.  Temperature greater than 101 degrees. Severe pain not relieved by pain medication. Incision starts to come apart.  Follow Up Appt Call today for appointment in 1-2 weeks ([phone]) or for problems.  If you have any hardware placed in your spine, you will need an x-ray before your appointment.   

## 2019-09-29 NOTE — Op Note (Signed)
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  Right carpal tunnel release open  Surgeon: Dominica Severin Misako Roeder  Assistant: Nash Shearer  Anesthesia: General  EBL: Minimal  HPI: 59 year old gentleman is a longstanding issues with his neck and his back is a known myelopathy has had previous spinal cord injury and previous ACDF C5-C7.  Patient developed progressive worsening neck pain shoulder pain arm pain numbness tingling in his right hand.  Work-up revealed a disc herniation C4-5 above the level of his previous fusion as well as EMG documented severe median nerve entrapment at the wrist with axonal loss.  Due to patient's progression of clinical syndrome imaging findings and failed conservative treatment I recommended ACDF at C4-5 as well as a right carpal tunnel release.  I extensively went over the risks and benefits of the operation with him as well as perioperative course expectations of outcome and alternatives of surgery and he understands and agrees to proceed forward.  Operative procedure: Patient was brought into the OR was due to general anesthesia positioned supine the neck in slight extension 5 pounds halter traction the right subjective prepped and draped in routine sterile fashion preoperative x-ray localized the appropriate level so a curvilinear incision was made just off the midline to the anterior border of the sternocleidomastoid and superficial abscess was dissected via longitudinally the avascular plane between the sternocleidomastoid and strap muscles was developed on the prefashioned.  The fascia dissected Kitners.  I immediately identified the old plate and dissected the  longus Coley laterally and self-retaining retractor was placed.  Disc base was incised anterior osteophytes were removed with osteophyte remover and a 3 Miller Kerrison punch.  Disc base was drilled down capturing the bone shavings and mucus trap.  Under microscopic lamination further and abutting both endplates helped remove the posterior annulus and posterior longitudinal ligament there was a large disc herniation migrating subligamentous as well as partly there was some scar tissue from old surgery creating an inflammatory response around the dura but identified both C5 pedicles and skeletonized both C5 nerve roots flush with the pedicle aggressively under bit both endplates decompressing central canal and removing a large amount of central disc.  At the end of discectomy there is no further stenosis the thecal sac and resume normal anatomic position.  Endplates were then scraped a size 7 mm parallel cage was selected packed with locally harvested autograft mixed and inserted.  Then I selected a 14 mm globus plate and was able to fit it above the previous venture plate so as not to have to remove the old plate.  Placed for 14 mm screws all screws had excellent purchase locking mechanisms were engaged.  Packed some additional bone graft along the sides of the cage prior to plate placement and underneath the plate.  Wounds then closed in layers with Vicryl skin screws running 4 subcuticular Dermabond benzoin Steri-Strips and a sterile dressing was applied patient was repositioned for the carpal tunnel release.  His right hand was prepped and draped in routine sterile fashion an incision was drawn approximate 4 cm from distal crease of the wrist along the palmar crease then the subcutaneous tissue was dissected free until identified the transverse carpal ligament this was sequentially divided in layers until  I could visualize the median nerve epineurium.  Then utilizing a mosquito I was able to develop the plane of  the epineurium of the median nerve and incised the ligament above it.  It was densely adherent causing a severe amount of stenosis this was all cleaned up the ligament was divided both proximally and distally until the nerve was easily free and clear.  Wounds copiously irrigated to Kassim is is was maintained the hand was closed with interrupted vertical mattress nylons and then dressed patient recovery in stable condition.  At the end the case on the account sponge counts were correct.

## 2019-09-30 ENCOUNTER — Encounter: Payer: Self-pay | Admitting: *Deleted

## 2019-09-30 MED FILL — Thrombin For Soln 5000 Unit: CUTANEOUS | Qty: 5000 | Status: AC

## 2019-10-28 ENCOUNTER — Other Ambulatory Visit: Payer: Self-pay | Admitting: Neurosurgery

## 2019-10-28 DIAGNOSIS — M542 Cervicalgia: Secondary | ICD-10-CM

## 2019-10-29 ENCOUNTER — Ambulatory Visit
Admission: RE | Admit: 2019-10-29 | Discharge: 2019-10-29 | Disposition: A | Discharge status: Home / Self Care | Payer: Medicare Other | Source: Ambulatory Visit | Attending: Neurosurgery | Admitting: Neurosurgery

## 2019-10-29 DIAGNOSIS — M4322 Fusion of spine, cervical region: Secondary | ICD-10-CM | POA: Diagnosis not present

## 2019-10-29 DIAGNOSIS — M542 Cervicalgia: Secondary | ICD-10-CM

## 2019-11-01 DIAGNOSIS — G4733 Obstructive sleep apnea (adult) (pediatric): Secondary | ICD-10-CM | POA: Diagnosis not present

## 2019-11-23 DIAGNOSIS — M5412 Radiculopathy, cervical region: Secondary | ICD-10-CM | POA: Diagnosis not present

## 2019-11-23 DIAGNOSIS — M542 Cervicalgia: Secondary | ICD-10-CM | POA: Diagnosis not present

## 2019-11-24 ENCOUNTER — Other Ambulatory Visit: Payer: Self-pay | Admitting: Family Medicine

## 2019-12-13 ENCOUNTER — Telehealth: Payer: Self-pay

## 2019-12-13 DIAGNOSIS — M170 Bilateral primary osteoarthritis of knee: Secondary | ICD-10-CM | POA: Diagnosis not present

## 2019-12-13 NOTE — Telephone Encounter (Signed)
   [facility] Pre-operative Risk Assessment    HEARTCARE STAFF: - Please ensure there is not already an duplicate clearance open for this procedure. - Under Visit Info/Reason for Call, type in Other and utilize the format Clearance MM/DD/YY or Clearance TBD. Do not use dashes or single digits. - If request is for dental extraction, please clarify the # of teeth to be extracted.  Request for surgical clearance:  1. What type of surgery is being performed? Lt total knee replacement  2. When is this surgery scheduled? tbd   3. What type of clearance is required (medical clearance vs. Pharmacy clearance to hold med vs. Both)? medical  4. Are there any medications that need to be held prior to surgery and how long? n/a  5. Practice name and name of physician performing surgery? [facility] / Ernesta Amble. Percell Miller, MD  6. What is the office phone number? [place]   7.   What is the office fax number? [phone]  8.   Anesthesia type (None, local, MAC, general) ?unknown   Sherrie Mustache 12/13/2019, 5:16 PM  _________________________________________________________________   (provider comments below)

## 2019-12-14 ENCOUNTER — Telehealth: Payer: Self-pay | Admitting: Family Medicine

## 2019-12-14 NOTE — Telephone Encounter (Signed)
   Primary Cardiologist: Kirk Ruths, MD  Chart reviewed as part of pre-operative protocol coverage. Given past medical history and time since last visit, based on ACC/AHA guidelines, Mark [name]. would be at acceptable risk for the planned procedure without further cardiovascular testing.   I will route this recommendation to the requesting party via Epic fax function and remove from pre-op pool.  Please call with questions.  Jossie Ng. Cleaver NP-C    12/14/2019, 2:24 PM [place] [address] Office [phone] Fax [phone]

## 2019-12-14 NOTE — Telephone Encounter (Signed)
Please call patient and have him schedule a preoperative clearance for left total knee replacement as recommended by his orthopedic specialist.  We have not seen him since January so we will need him to come in for a clearance visit.

## 2019-12-14 NOTE — Telephone Encounter (Signed)
Appointment has been made. No further questions at this time.  

## 2019-12-21 ENCOUNTER — Ambulatory Visit (INDEPENDENT_AMBULATORY_CARE_PROVIDER_SITE_OTHER): Payer: Medicare Other | Admitting: Family Medicine

## 2019-12-21 ENCOUNTER — Encounter: Payer: Self-pay | Admitting: Family Medicine

## 2019-12-21 ENCOUNTER — Other Ambulatory Visit: Payer: Self-pay

## 2019-12-21 VITALS — BP 110/78 | HR 85 | Ht 73.0 in | Wt 297.0 lb

## 2019-12-21 DIAGNOSIS — Z1159 Encounter for screening for other viral diseases: Secondary | ICD-10-CM

## 2019-12-21 DIAGNOSIS — Z01818 Encounter for other preprocedural examination: Secondary | ICD-10-CM

## 2019-12-21 NOTE — Progress Notes (Signed)
Established Patient Office Visit  Subjective:  Patient ID: Mark [name]., male    DOB: [birth year]  Age: 59 y.o. MRN: [id number]  CC:  Chief Complaint  Patient presents with  . Pre-op Exam    HPI Mark [name] [employer]. presents for preoperative clearance for left total knee replacement.  He has a history of hypertension, ascending aortic aneurysm, MS, fatty liver, and obstructive sleep apnea.  He actually just underwent cervical spine surgery in March.  No prior complications with anesthesia.  No recent CP, or SOB or palpitations.  Can walk one city block.  He has recently lost some weight.   He wears his CPAP some but not consistent lately.    He is not currently on any NSAIDs, fish oil again saying or other anticoagulants.  Patient Active Problem List   Diagnosis Date Noted  . HNP (herniated nucleus pulposus), cervical 09/29/2019  . Fatty liver 07/30/2019  . Ascending aortic aneurysm ([place]) 07/30/2019  . Ground glass opacity present on imaging of lung 07/30/2019  . Other male erectile dysfunction 11/12/2018  . Essential hypertension 11/12/2018  . Increased frequency of urination 10/30/2018  . Blurry vision 10/30/2018  . Burning sensation 09/09/2016  . Anemia 08/21/2016  . Myalgia 08/20/2016  . Spinal stenosis of thoracolumbar region 05/17/2016  . Paraparesis ([place]) 03/20/2016  . Left lumbar radiculitis 02/13/2016  . Pain in both lower extremities 01/15/2016  . Morbid obesity ([place]) 01/03/2016  . Elevated BP without diagnosis of hypertension 01/03/2016  . Male hypogonadism 12/08/2015  . BPH (benign prostatic hyperplasia) 12/08/2015  . Chronic cough 10/02/2015  . Viral myositis 09/27/2015  . Fatigue 09/11/2015  . Encounter for monitoring immunomodulating therapy 09/11/2015  . HNP (herniated nucleus pulposus), lumbar 04/17/2015  . Hearing loss 12/13/2014  . Severe obesity (BMI >= 40) ([place]) 11/23/2014  . 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 Neuropathy  10/25/2013  . Cervical osteoarthritis 09/15/2013  . Degenerative arthritis of lumbar spine 09/15/2013  . Submucosal colonic leiomyoma 09/15/2013  . MS (multiple sclerosis) ([place]) 02/11/2013  . Sleep apnea with use of continuous positive airway pressure (CPAP) 02/11/2013  . DEPRESSION 08/18/2007  . UNSPECIFIED VENOUS INSUFFICIENCY 08/18/2007  . Neck pain 08/18/2007  . NEUROGENIC BLADDER 08/10/2007  . LEG EDEMA, BILATERAL 08/03/2007     Past Medical History:  Diagnosis Date  . Anxiety    chronic pain treated with narcotics  . Arthritis   . Chronic pain   . Constipation due to opioid therapy   . Depression   . Dysphonia    thoracic left sided"impingement" with heminumbness  . Dyspnea   . GI bleed    abt 15 years ago  . Morbid obesity ([place])   . MS (multiple sclerosis) ([place]) 02/11/2013   [name]  . Pneumonia   . Sleep apnea with use of continuous positive airway pressure (CPAP) 02/11/2013   07-13-12 AHi of 98.6 /hr titrated to 11 cm water ,  average user time 4 hours and 4 minutes. Residual AHI 1.20 December 2012 .  Marland Kitchen Umbilical hernia     Past Surgical History:  Procedure Laterality Date  . ANTERIOR CERVICAL DECOMP/DISCECTOMY FUSION N/A 09/29/2019   Procedure: Anterior Cervical Decompression Fusion - Cervical Four-Cervical Five;  Surgeon: [name], MD;  Location: [facility];  Service: Neurosurgery;  Laterality: N/A;  Anterior Cervical Decompression Fusion - Cervical Four-Cervical Five   . CARPAL TUNNEL RELEASE Right    [name]  . CARPAL TUNNEL RELEASE Right 09/29/2019  Procedure: Carpal Tunnel Release;  Surgeon: [name], MD;  Location: [facility];  Service: Neurosurgery;  Laterality: Right;  . COLONOSCOPY W/ POLYPECTOMY    . ESOPHAGOGASTRODUODENOSCOPY    . MULTIPLE TOOTH EXTRACTIONS    . POSTERIOR LUMBAR FUSION  04/17/15   [name], also removed old hardware  . SPINE SURGERY     total of six vertebras fused, three lumbar surgeries and one anterior neck fusion  .  TONSILLECTOMY      Family History  Problem Relation Age of Onset  . Arrhythmia Mother   . Cancer Other   . Cancer Other   . Sleep walking Son       Outpatient Medications Prior to Visit  Medication Sig Dispense Refill  . amantadine (SYMMETREL) 100 MG capsule Take 100 mg by mouth 3 (three) times daily.     Marland Kitchen amphetamine-dextroamphetamine (ADDERALL XR) 30 MG 24 hr capsule Take 30 mg by mouth daily.     . cyclobenzaprine (FLEXERIL) 10 MG tablet Take 1 tablet (10 mg total) by mouth 3 (three) times daily as needed for muscle spasms. 30 tablet 0  . DULoxetine (CYMBALTA) 60 MG capsule Take 60 mg by mouth 2 (two) times daily.     . fluticasone furoate-vilanterol (BREO ELLIPTA) 100-25 MCG/INH AEPB Inhale 1 puff into the lungs daily as needed (shortness of breath).     . gabapentin (NEURONTIN) 800 MG tablet Take 800 mg by mouth 4 (four) times daily.  11  . HYDROcodone-acetaminophen (NORCO) 10-325 MG tablet Take 2 tablets by mouth every 4 (four) hours as needed for severe pain. 40 tablet 0  . ibuprofen (ADVIL,MOTRIN) 800 MG tablet Take 800 mg by mouth every 8 (eight) hours as needed for moderate pain.    Marland Kitchen loratadine (CLARITIN) 10 MG tablet Take 10 mg by mouth at bedtime.    Marland Kitchen losartan-hydrochlorothiazide (HYZAAR) 50-12.5 MG tablet TAKE ONE TABLET BY MOUTH EVERY DAY 90 tablet 1  . Teriflunomide (AUBAGIO) 14 MG TABS Take 14 mg by mouth at bedtime.     Marland Kitchen tiZANidine (ZANAFLEX) 4 MG tablet Take 4 mg by mouth every 6 (six) hours as needed for muscle spasms.     . VENTOLIN HFA 108 (90 Base) MCG/ACT inhaler Inhale 1 puff into the lungs every 6 (six) hours as needed for wheezing or shortness of breath. 18 g prn  . omeprazole (PRILOSEC) 20 MG capsule Take 20 mg by mouth daily.    . tadalafil (CIALIS) 20 MG tablet Take 20 mg by mouth daily as needed for erectile dysfunction.     No facility-administered medications prior to visit.    No Known Allergies  ROS Review of Systems    Objective:     Physical Exam  Constitutional: He is oriented to person, place, and time. He appears well-developed and well-nourished.  HENT:  Head: Normocephalic and atraumatic.  Right Ear: External ear normal.  Left Ear: External ear normal.  Nose: Nose normal.  Mouth/Throat: Oropharynx is clear and moist.  Eyes: Pupils are equal, round, and reactive to light. Conjunctivae and EOM are normal.  Neck: No thyromegaly present.  Cardiovascular: Normal rate, regular rhythm, normal heart sounds and intact distal pulses.  Pulmonary/Chest: Effort normal and breath sounds normal.  Abdominal: Soft. Bowel sounds are normal. He exhibits no distension and no mass. There is no abdominal tenderness. There is no rebound and no guarding.  Musculoskeletal:        General: Normal range of motion.     Cervical back:  Normal range of motion and neck supple.  Lymphadenopathy:    He has no cervical adenopathy.  Neurological: He is alert and oriented to person, place, and time. He has normal reflexes.  Skin: Skin is warm and dry.  Psychiatric: He has a normal mood and affect. His behavior is normal. Judgment and thought content normal.    BP 110/78   Pulse 85   Ht 6\' 1"  (1.854 m)   Wt 297 lb (134.7 kg)   SpO2 96%   BMI 39.18 kg/m  Wt Readings from Last 3 Encounters:  12/21/19 297 lb (134.7 kg)  09/29/19 (!) 308 lb 3.2 oz (139.8 kg)  09/27/19 (!) 308 lb 3.2 oz (139.8 kg)     Health Maintenance Due  Topic Date Due  . HIV Screening  Never done    There are no preventive care reminders to display for this patient.  Lab Results  Component Value Date   TSH 0.33 (L) 03/09/2018   Lab Results  Component Value Date   WBC 6.9 12/21/2019   HGB 14.3 12/21/2019   HCT 42.3 12/21/2019   MCV 87.6 12/21/2019   PLT 257 12/21/2019   Lab Results  Component Value Date   NA 137 12/21/2019   K 4.4 12/21/2019   CO2 29 12/21/2019   GLUCOSE 95 12/21/2019   BUN 19 12/21/2019   CREATININE 0.86 12/21/2019   BILITOT 0.4  12/21/2019   ALKPHOS 92 08/20/2016   AST 16 12/21/2019   ALT 15 12/21/2019   PROT 6.3 12/21/2019   ALBUMIN 4.3 08/20/2016   CALCIUM 9.7 12/21/2019   ANIONGAP 7 09/27/2019   Lab Results  Component Value Date   CHOL 165 08/03/2019   Lab Results  Component Value Date   HDL 51 08/03/2019   Lab Results  Component Value Date   LDLCALC 99 08/03/2019   Lab Results  Component Value Date   TRIG 61 08/03/2019   Lab Results  Component Value Date   CHOLHDL 3.2 08/03/2019   Lab Results  Component Value Date   HGBA1C 5.1 12/21/2019      Assessment & Plan:   Problem List Items Addressed This Visit    None    Visit Diagnoses    Preop examination    -  Primary   Relevant Orders   COMPLETE METABOLIC PANEL WITH GFR (Completed)   CBC (Completed)   Hemoglobin A1c (Completed)   Hepatitis C Antibody   SARS-CoV-2 Antibodies (Completed)   Encounter for hepatitis C screening test for low risk patient       Relevant Orders   Hepatitis C Antibody     He is cleared for surgery.  EKG shows rate of 76 bpm, normal sinus rhythm with no acute ST-T wave changes.  Evidence of possible old anterior infarct. BMI under 40. A1C looks great today. Not on any NSAIDS or anticoagulants. Avoid all NSAIDs, etc.    Note: he would also like to have antibody testing for Covid he thinks he may have had it in the past.  No orders of the defined types were placed in this encounter.   Follow-up: Return if symptoms worsen or fail to improve.    Beatrice Lecher, MD

## 2019-12-21 NOTE — Patient Instructions (Signed)
Some of the medications that are currently used for weight loss include Saxenda, Qsymia, phentermine, Contrave.

## 2019-12-22 ENCOUNTER — Encounter: Payer: Self-pay | Admitting: Family Medicine

## 2019-12-22 LAB — CBC
HCT: 42.3 % (ref 38.5–50.0)
Hemoglobin: 14.3 g/dL (ref 13.2–17.1)
MCH: 29.6 pg (ref 27.0–33.0)
MCHC: 33.8 g/dL (ref 32.0–36.0)
MCV: 87.6 fL (ref 80.0–100.0)
MPV: 10.2 fL (ref 7.5–12.5)
Platelets: 257 10*3/uL (ref 140–400)
RBC: 4.83 10*6/uL (ref 4.20–5.80)
RDW: 12.9 % (ref 11.0–15.0)
WBC: 6.9 10*3/uL (ref 3.8–10.8)

## 2019-12-22 LAB — COMPLETE METABOLIC PANEL WITH GFR
AG Ratio: 2 (calc) (ref 1.0–2.5)
ALT: 15 U/L (ref 9–46)
AST: 16 U/L (ref 10–35)
Albumin: 4.2 g/dL (ref 3.6–5.1)
Alkaline phosphatase (APISO): 94 U/L (ref 35–144)
BUN: 19 mg/dL (ref 7–25)
CO2: 29 mmol/L (ref 20–32)
Calcium: 9.7 mg/dL (ref 8.6–10.3)
Chloride: 101 mmol/L (ref 98–110)
Creat: 0.86 mg/dL (ref 0.70–1.33)
GFR, Est African American: 112 mL/min/{1.73_m2} (ref >=60)
GFR, Est Non African American: 96 mL/min/{1.73_m2} (ref >=60)
Globulin: 2.1 g/dL (calc) (ref 1.9–3.7)
Glucose, Bld: 95 mg/dL (ref 65–99)
Potassium: 4.4 mmol/L (ref 3.5–5.3)
Sodium: 137 mmol/L (ref 135–146)
Total Bilirubin: 0.4 mg/dL (ref 0.2–1.2)
Total Protein: 6.3 g/dL (ref 6.1–8.1)

## 2019-12-22 LAB — SARS-COV-2 ANTIBODIES: SARS-CoV-2 Antibodies: POSITIVE

## 2019-12-22 LAB — HEMOGLOBIN A1C
Hgb A1c MFr Bld: 5.1 % of total Hgb (ref <5.7)
Mean Plasma Glucose: 100 (calc)
eAG (mmol/L): 5.5 (calc)

## 2019-12-22 LAB — HEPATITIS C ANTIBODY
Hepatitis C Ab: NONREACTIVE
SIGNAL TO CUT-OFF: 0.05 (ref <1.00)

## 2019-12-23 DIAGNOSIS — J309 Allergic rhinitis, unspecified: Secondary | ICD-10-CM | POA: Diagnosis not present

## 2019-12-23 DIAGNOSIS — Z9989 Dependence on other enabling machines and devices: Secondary | ICD-10-CM | POA: Diagnosis not present

## 2019-12-23 DIAGNOSIS — J45909 Unspecified asthma, uncomplicated: Secondary | ICD-10-CM | POA: Diagnosis not present

## 2019-12-23 DIAGNOSIS — G4733 Obstructive sleep apnea (adult) (pediatric): Secondary | ICD-10-CM | POA: Diagnosis not present

## 2019-12-27 NOTE — Addendum Note (Signed)
Addended by: Narda Rutherford on: 12/27/2019 07:18 AM   Modules accepted: Orders

## 2020-01-05 DIAGNOSIS — Z5181 Encounter for therapeutic drug level monitoring: Secondary | ICD-10-CM | POA: Diagnosis not present

## 2020-01-05 DIAGNOSIS — G8381 Brown-Sequard syndrome: Secondary | ICD-10-CM | POA: Diagnosis not present

## 2020-01-05 DIAGNOSIS — Z79899 Other long term (current) drug therapy: Secondary | ICD-10-CM | POA: Diagnosis not present

## 2020-01-05 DIAGNOSIS — G629 Polyneuropathy, unspecified: Secondary | ICD-10-CM | POA: Diagnosis not present

## 2020-01-05 DIAGNOSIS — G35 Multiple sclerosis: Secondary | ICD-10-CM | POA: Diagnosis not present

## 2020-01-13 DIAGNOSIS — M542 Cervicalgia: Secondary | ICD-10-CM | POA: Diagnosis not present

## 2020-02-18 DIAGNOSIS — G4733 Obstructive sleep apnea (adult) (pediatric): Secondary | ICD-10-CM | POA: Diagnosis not present

## 2020-02-26 ENCOUNTER — Encounter: Payer: Self-pay | Admitting: Family Medicine

## 2020-03-09 DIAGNOSIS — Z86010 Personal history of colonic polyps: Secondary | ICD-10-CM | POA: Diagnosis not present

## 2020-03-09 DIAGNOSIS — Z01812 Encounter for preprocedural laboratory examination: Secondary | ICD-10-CM | POA: Diagnosis not present

## 2020-03-09 DIAGNOSIS — Z20822 Contact with and (suspected) exposure to covid-19: Secondary | ICD-10-CM | POA: Diagnosis not present

## 2020-03-13 NOTE — Progress Notes (Signed)
HPI: FUhypertensionand TAA. Outside echocardiogram October 2019 showed normal LV function and severe left ventricular hypertrophy.Plan at previous office visit was to control blood pressure and repeat echocardiogram in the future. I also reviewed previous outside echocardiogram and felt left ventricular hypertrophy was mild with mild proximal septal thickening. Echocardiogram December 2020 showed normal LV function and moderately dilated ascending aorta measuring 49 mm. CTA January 2021 showed ascending thoracic aortic aneurysm measuring 4.5 cm.  There were areas of possible edema.  Since last seen, there is no dyspnea, chest pain, palpitations or syncope.  No pedal edema.  Current Outpatient Medications  Medication Sig Dispense Refill  . amantadine (SYMMETREL) 100 MG capsule Take 100 mg by mouth 3 (three) times daily.     Marland Kitchen amphetamine-dextroamphetamine (ADDERALL XR) 30 MG 24 hr capsule Take 30 mg by mouth daily.     . cyclobenzaprine (FLEXERIL) 10 MG tablet Take 1 tablet (10 mg total) by mouth 3 (three) times daily as needed for muscle spasms. 30 tablet 0  . DULoxetine (CYMBALTA) 60 MG capsule Take 60 mg by mouth 2 (two) times daily.     . fluticasone furoate-vilanterol (BREO ELLIPTA) 100-25 MCG/INH AEPB Inhale 1 puff into the lungs daily as needed (shortness of breath).     . gabapentin (NEURONTIN) 800 MG tablet Take 800 mg by mouth 4 (four) times daily.  11  . HYDROcodone-acetaminophen (NORCO) 10-325 MG tablet Take 2 tablets by mouth every 4 (four) hours as needed for severe pain. 40 tablet 0  . ibuprofen (ADVIL,MOTRIN) 800 MG tablet Take 800 mg by mouth every 8 (eight) hours as needed for moderate pain.    Marland Kitchen loratadine (CLARITIN) 10 MG tablet Take 10 mg by mouth at bedtime.    Marland Kitchen losartan-hydrochlorothiazide (HYZAAR) 50-12.5 MG tablet TAKE ONE TABLET BY MOUTH EVERY DAY 90 tablet 1  . Teriflunomide (AUBAGIO) 14 MG TABS Take 14 mg by mouth at bedtime.     Marland Kitchen tiZANidine (ZANAFLEX) 4  MG tablet Take 4 mg by mouth every 6 (six) hours as needed for muscle spasms.     . VENTOLIN HFA 108 (90 Base) MCG/ACT inhaler Inhale 1 puff into the lungs every 6 (six) hours as needed for wheezing or shortness of breath. 18 g prn   No current facility-administered medications for this visit.     Past Medical History:  Diagnosis Date  . Anxiety    chronic pain treated with narcotics  . Arthritis   . Chronic pain   . Constipation due to opioid therapy   . Depression   . Dysphonia    thoracic left sided"impingement" with heminumbness  . Dyspnea   . GI bleed    abt 15 years ago  . Morbid obesity ([place])   . MS (multiple sclerosis) ([place]) 02/11/2013   [name]  . Pneumonia   . Sleep apnea with use of continuous positive airway pressure (CPAP) 02/11/2013   07-13-12 AHi of 98.6 /hr titrated to 11 cm water ,  average user time 4 hours and 4 minutes. Residual AHI 1.20 December 2012 .  Marland Kitchen Umbilical hernia     Past Surgical History:  Procedure Laterality Date  . ANTERIOR CERVICAL DECOMP/DISCECTOMY FUSION N/A 09/29/2019   Procedure: Anterior Cervical Decompression Fusion - Cervical Four-Cervical Five;  Surgeon: [name], MD;  Location: [facility];  Service: Neurosurgery;  Laterality: N/A;  Anterior Cervical Decompression Fusion - Cervical Four-Cervical Five   . CARPAL TUNNEL RELEASE Right  [name]  . CARPAL TUNNEL RELEASE Right 09/29/2019   Procedure: Carpal Tunnel Release;  Surgeon: [name], MD;  Location: [facility];  Service: Neurosurgery;  Laterality: Right;  . COLONOSCOPY W/ POLYPECTOMY    . ESOPHAGOGASTRODUODENOSCOPY    . MULTIPLE TOOTH EXTRACTIONS    . POSTERIOR LUMBAR FUSION  04/17/15   [name], also removed old hardware  . SPINE SURGERY     total of six vertebras fused, three lumbar surgeries and one anterior neck fusion  . TONSILLECTOMY      Social History   Socioeconomic History  . Marital status: Married    Spouse name: [name]  . Number of children: 2  . Years of  education: 12+  . Highest education level: Some college, no degree  Occupational History  . 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 Smoking status: Never Smoker  . Smokeless tobacco: Never Used  Vaping Use  . Vaping Use: Never used  Substance and Sexual Activity  . Alcohol use: Yes    Comment: occasionally  . Drug use: No  . Sexual activity: Not Currently  Other Topics Concern  . Not on file  Social History Narrative       Patient is married [name]) and lives at home with his wife, daughter, son-in-law and grandchild.   Patient has two adult children.   Patient is right-handed.   Patient is disabled.   Patient has a college education.   Patient drinks a gallon of tea daily.      Social Determinants of Health   Financial Resource Strain: Low Risk   . Difficulty of Paying Living Expenses: Not hard at all  Food Insecurity: No Food Insecurity  . Worried About Charity fundraiser in the Last Year: Never true  . Ran Out of Food in the Last Year: Never true  Transportation Needs: No Transportation Needs  . Lack of Transportation (Medical): No  . Lack of Transportation (Non-Medical): No  Physical Activity: Inactive  . Days of Exercise per Week: 0 days  . Minutes of Exercise per Session: 0 min  Stress: No Stress Concern Present  . Feeling of Stress : Not at all  Social Connections: Moderately Integrated  . Frequency of Communication with Friends and Family: More than three times a week  . Frequency of Social Gatherings with Friends and Family: Once a week  . Attends Religious Services: More than 4 times per year  . Active Member of Clubs or Organizations: No  . Attends Archivist Meetings: Never  . Marital Status: Married  Human resources officer Violence: Not At Risk  . Fear of Current or Ex-Partner: No  . Emotionally Abused: No  . Physically Abused: No  . Sexually Abused: No    Family History  Problem Relation Age of Onset  .  Arrhythmia Mother   . Cancer Other   . Cancer Other   . Sleep walking Son     ROS: Lower extremity weakness and back pain but no fevers or chills, productive cough, hemoptysis, dysphasia, odynophagia, melena, hematochezia, dysuria, hematuria, rash, seizure activity, orthopnea, PND, pedal edema, claudication. Remaining systems are negative.  Physical Exam: Well-developed well-nourished in no acute distress.  Skin is warm and dry.  HEENT is normal.  Neck is supple.  Chest is clear to auscultation with normal expansion.  Cardiovascular exam is regular rate and rhythm.  Abdominal exam nontender or distended. No masses palpated. Extremities show no edema. neuro grossly  intact  A/P  1 thoracic aortic aneurysm-we will plan to repeat CTA.  2 hypertension-patient's blood pressure is controlled.  Continue present medical regimen.  3 History of left ventricular hypertrophy-this was not evident on most recent echocardiogram.  4 obesity-continue efforts at weight loss.  5 obstructive sleep apnea-continue CPAP.  Kirk Ruths, MD

## 2020-03-14 DIAGNOSIS — M5022 Other cervical disc displacement, mid-cervical region: Secondary | ICD-10-CM | POA: Diagnosis not present

## 2020-03-14 DIAGNOSIS — M542 Cervicalgia: Secondary | ICD-10-CM | POA: Diagnosis not present

## 2020-03-15 DIAGNOSIS — Z86010 Personal history of colonic polyps: Secondary | ICD-10-CM | POA: Diagnosis not present

## 2020-03-15 DIAGNOSIS — K648 Other hemorrhoids: Secondary | ICD-10-CM | POA: Diagnosis not present

## 2020-03-15 DIAGNOSIS — K6289 Other specified diseases of anus and rectum: Secondary | ICD-10-CM | POA: Diagnosis not present

## 2020-03-15 DIAGNOSIS — Z1211 Encounter for screening for malignant neoplasm of colon: Secondary | ICD-10-CM | POA: Diagnosis not present

## 2020-03-15 DIAGNOSIS — K5730 Diverticulosis of large intestine without perforation or abscess without bleeding: Secondary | ICD-10-CM | POA: Diagnosis not present

## 2020-03-16 ENCOUNTER — Other Ambulatory Visit: Payer: Self-pay | Admitting: Family Medicine

## 2020-03-20 ENCOUNTER — Other Ambulatory Visit: Payer: Self-pay

## 2020-03-20 ENCOUNTER — Encounter: Payer: Self-pay | Admitting: Cardiology

## 2020-03-20 ENCOUNTER — Ambulatory Visit: Payer: Medicare Other | Admitting: Cardiology

## 2020-03-20 VITALS — BP 128/76 | HR 78 | Ht 72.0 in | Wt 309.8 lb

## 2020-03-20 DIAGNOSIS — I10 Essential (primary) hypertension: Secondary | ICD-10-CM | POA: Diagnosis not present

## 2020-03-20 DIAGNOSIS — I712 Thoracic aortic aneurysm, without rupture, unspecified: Secondary | ICD-10-CM

## 2020-03-20 NOTE — Patient Instructions (Signed)
Medication Instructions:  NO CHANGE *If you need a refill on your cardiac medications before your next appointment, please call your pharmacy*   Lab Work: If you have labs (blood work) drawn today and your tests are completely normal, you will receive your results only by: Marland Kitchen MyChart Message (if you have MyChart) OR . A paper copy in the mail If you have any lab test that is abnormal or we need to change your treatment, we will call you to review the results.   Testing/Procedures:  CTA OF THE CHEST W/WO TO FOLLOW UP ON THORACIC ANEURYSM AT [address]   Follow-Up: At [facility], you and your health needs are our priority.  As part of our continuing mission to provide you with exceptional heart care, we have created designated Provider Care Teams.  These Care Teams include your primary Cardiologist (physician) and Advanced Practice Providers (APPs -  Physician Assistants and Nurse Practitioners) who all work together to provide you with the care you need, when you need it.  We recommend signing up for the patient portal called "MyChart".  Sign up information is provided on this After Visit Summary.  MyChart is used to connect with patients for Virtual Visits (Telemedicine).  Patients are able to view lab/test results, encounter notes, upcoming appointments, etc.  Non-urgent messages can be sent to your provider as well.   To learn more about what you can do with MyChart, go to [email or web address].    Your next appointment:   12 month(s)  The format for your next appointment:   In Person  Provider:   You may see Kirk Ruths, MD or one of the following Advanced Practice Providers on your designated Care Team:    Kerin Ransom, PA-C  [place], [place]  Coletta Memos, [place]

## 2020-03-31 ENCOUNTER — Other Ambulatory Visit: Payer: Self-pay

## 2020-03-31 ENCOUNTER — Ambulatory Visit
Admission: RE | Admit: 2020-03-31 | Discharge: 2020-03-31 | Disposition: A | Discharge status: Home / Self Care | Payer: Medicare Other | Source: Ambulatory Visit | Attending: Cardiology | Admitting: Cardiology

## 2020-03-31 DIAGNOSIS — I712 Thoracic aortic aneurysm, without rupture, unspecified: Secondary | ICD-10-CM

## 2020-03-31 DIAGNOSIS — K760 Fatty (change of) liver, not elsewhere classified: Secondary | ICD-10-CM | POA: Diagnosis not present

## 2020-03-31 DIAGNOSIS — J8410 Pulmonary fibrosis, unspecified: Secondary | ICD-10-CM | POA: Diagnosis not present

## 2020-03-31 MED ORDER — IOPAMIDOL (ISOVUE-370) INJECTION 76%
75.0000 mL | Freq: Once | INTRAVENOUS | Status: AC | PRN
  Administered 2020-03-31: 75 mL via INTRAVENOUS

## 2020-04-05 ENCOUNTER — Other Ambulatory Visit: Payer: Self-pay | Admitting: *Deleted

## 2020-04-05 DIAGNOSIS — I712 Thoracic aortic aneurysm, without rupture, unspecified: Secondary | ICD-10-CM

## 2020-04-05 DIAGNOSIS — I7121 Aneurysm of the ascending aorta, without rupture: Secondary | ICD-10-CM

## 2020-04-06 DIAGNOSIS — H903 Sensorineural hearing loss, bilateral: Secondary | ICD-10-CM | POA: Diagnosis not present

## 2020-04-20 DIAGNOSIS — M5440 Lumbago with sciatica, unspecified side: Secondary | ICD-10-CM | POA: Diagnosis not present

## 2020-04-20 DIAGNOSIS — M542 Cervicalgia: Secondary | ICD-10-CM | POA: Diagnosis not present

## 2020-04-20 DIAGNOSIS — M546 Pain in thoracic spine: Secondary | ICD-10-CM | POA: Diagnosis not present

## 2020-05-03 DIAGNOSIS — M170 Bilateral primary osteoarthritis of knee: Secondary | ICD-10-CM | POA: Diagnosis not present

## 2020-05-18 DIAGNOSIS — M542 Cervicalgia: Secondary | ICD-10-CM | POA: Diagnosis not present

## 2020-05-18 DIAGNOSIS — M546 Pain in thoracic spine: Secondary | ICD-10-CM | POA: Diagnosis not present

## 2020-05-18 DIAGNOSIS — M5440 Lumbago with sciatica, unspecified side: Secondary | ICD-10-CM | POA: Diagnosis not present

## 2020-05-21 DIAGNOSIS — H6001 Abscess of right external ear: Secondary | ICD-10-CM | POA: Diagnosis not present

## 2020-05-30 ENCOUNTER — Other Ambulatory Visit: Payer: Self-pay | Admitting: Family Medicine

## 2020-06-05 DIAGNOSIS — H6001 Abscess of right external ear: Secondary | ICD-10-CM | POA: Diagnosis not present

## 2020-06-12 DIAGNOSIS — M546 Pain in thoracic spine: Secondary | ICD-10-CM | POA: Diagnosis not present

## 2020-06-12 DIAGNOSIS — M542 Cervicalgia: Secondary | ICD-10-CM | POA: Diagnosis not present

## 2020-06-12 DIAGNOSIS — M5440 Lumbago with sciatica, unspecified side: Secondary | ICD-10-CM | POA: Diagnosis not present

## 2020-06-13 DIAGNOSIS — M542 Cervicalgia: Secondary | ICD-10-CM | POA: Diagnosis not present

## 2020-06-13 DIAGNOSIS — Z6839 Body mass index (BMI) 39.0-39.9, adult: Secondary | ICD-10-CM | POA: Diagnosis not present

## 2020-06-13 DIAGNOSIS — M5440 Lumbago with sciatica, unspecified side: Secondary | ICD-10-CM | POA: Diagnosis not present

## 2020-06-13 DIAGNOSIS — I10 Essential (primary) hypertension: Secondary | ICD-10-CM | POA: Diagnosis not present

## 2020-07-04 DIAGNOSIS — Z5181 Encounter for therapeutic drug level monitoring: Secondary | ICD-10-CM | POA: Diagnosis not present

## 2020-07-04 DIAGNOSIS — G8381 Brown-Sequard syndrome: Secondary | ICD-10-CM | POA: Diagnosis not present

## 2020-07-04 DIAGNOSIS — G35 Multiple sclerosis: Secondary | ICD-10-CM | POA: Diagnosis not present

## 2020-07-04 DIAGNOSIS — Z79899 Other long term (current) drug therapy: Secondary | ICD-10-CM | POA: Diagnosis not present

## 2020-07-27 ENCOUNTER — Other Ambulatory Visit: Payer: Self-pay

## 2020-07-27 ENCOUNTER — Ambulatory Visit (INDEPENDENT_AMBULATORY_CARE_PROVIDER_SITE_OTHER): Payer: Medicare Other | Admitting: Family Medicine

## 2020-07-27 ENCOUNTER — Encounter: Payer: Self-pay | Admitting: Family Medicine

## 2020-07-27 VITALS — BP 108/64 | HR 70 | Ht 72.0 in | Wt 301.0 lb

## 2020-07-27 DIAGNOSIS — M7021 Olecranon bursitis, right elbow: Secondary | ICD-10-CM

## 2020-07-27 MED ORDER — MELOXICAM 15 MG PO TABS: 15.0000 mg | ORAL_TABLET | Freq: Every day | ORAL | 0 refills | Status: DC

## 2020-07-27 NOTE — Progress Notes (Signed)
Acute Office Visit  Subjective:    Patient ID: Mark [name]., male    DOB: 1960/09/17, 60 y.o.   MRN: [id number]  Chief Complaint  Patient presents with  . Elbow Pain    HPI Patient is in today for   A couple of concerns.  His right elbow has been bothering him and its been swollen for couple of weeks.  He is right-handed.  He denies any known trauma or injury.  Though he has been working on his car a lot lately and also tends to rest his arms when he plays video games those been trying to use pillows to put underneath his arms.  He also wanted me to look at a bump in his right ear.  He said he is already been urgent care twice and has been given antibiotics it always seems to help but it does not completely go away it tends to get a scab on it.  And sometimes it gets really painful.  Past Medical History:  Diagnosis Date  . Anxiety    chronic pain treated with narcotics  . Arthritis   . Chronic pain   . Constipation due to opioid therapy   . Depression   . Dysphonia    thoracic left sided"impingement" with heminumbness  . Dyspnea   . GI bleed    abt 15 years ago  . Morbid obesity ([place])   . MS (multiple sclerosis) ([place]) 02/11/2013   [name]  . Pneumonia   . Sleep apnea with use of continuous positive airway pressure (CPAP) 02/11/2013   07-13-12 AHi of 98.6 /hr titrated to 11 cm water ,  average user time 4 hours and 4 minutes. Residual AHI 1.20 December 2012 .  Marland Kitchen Umbilical hernia     Past Surgical History:  Procedure Laterality Date  . ANTERIOR CERVICAL DECOMP/DISCECTOMY FUSION N/A 09/29/2019   Procedure: Anterior Cervical Decompression Fusion - Cervical Four-Cervical Five;  Surgeon: [name], MD;  Location: [facility];  Service: Neurosurgery;  Laterality: N/A;  Anterior Cervical Decompression Fusion - Cervical Four-Cervical Five   . CARPAL TUNNEL RELEASE Right    [name]  . CARPAL TUNNEL RELEASE Right 09/29/2019   Procedure: Carpal Tunnel Release;   Surgeon: [name], MD;  Location: [facility];  Service: Neurosurgery;  Laterality: Right;  . COLONOSCOPY W/ POLYPECTOMY    . ESOPHAGOGASTRODUODENOSCOPY    . MULTIPLE TOOTH EXTRACTIONS    . POSTERIOR LUMBAR FUSION  04/17/15   [name], also removed old hardware  . SPINE SURGERY     total of six vertebras fused, three lumbar surgeries and one anterior neck fusion  . TONSILLECTOMY      Family History  Problem Relation Age of Onset  . Arrhythmia Mother   . Cancer Other   . Cancer Other   . Sleep walking Son     Social History   Socioeconomic History  . Marital status: Married    Spouse name: [name]  . Number of children: 2  . Years of education: 12+  . Highest education level: Some college, no degree  Occupational History  . 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 Smoking status: Never Smoker  . Smokeless tobacco: Never Used  Vaping Use  . Vaping Use: Never used  Substance and Sexual Activity  . Alcohol use: Yes    Comment: occasionally  . Drug use: No  . Sexual activity: Not Currently  Other  Topics Concern  . Not on file  Social History Narrative       Patient is married [name]) and lives at home with his wife, daughter, son-in-law and grandchild.   Patient has two adult children.   Patient is right-handed.   Patient is disabled.   Patient has a college education.   Patient drinks a gallon of tea daily.      Social Determinants of Health   Financial Resource Strain: Not on file  Food Insecurity: Not on file  Transportation Needs: Not on file  Physical Activity: Not on file  Stress: Not on file  Social Connections: Not on file  Intimate Partner Violence: Not on file    Outpatient Medications Prior to Visit  Medication Sig Dispense Refill  . amantadine (SYMMETREL) 100 MG capsule Take 100 mg by mouth 3 (three) times daily.     Marland Kitchen amphetamine-dextroamphetamine (ADDERALL XR) 30 MG 24 hr capsule Take 30 mg by mouth daily.     .  cyclobenzaprine (FLEXERIL) 10 MG tablet Take 1 tablet (10 mg total) by mouth 3 (three) times daily as needed for muscle spasms. 30 tablet 0  . DULoxetine (CYMBALTA) 60 MG capsule Take 60 mg by mouth 2 (two) times daily.     . fluticasone furoate-vilanterol (BREO ELLIPTA) 100-25 MCG/INH AEPB Inhale 1 puff into the lungs daily as needed (shortness of breath).     . gabapentin (NEURONTIN) 800 MG tablet Take 800 mg by mouth 4 (four) times daily.  11  . HYDROcodone-acetaminophen (NORCO) 10-325 MG tablet Take 2 tablets by mouth every 4 (four) hours as needed for severe pain. 40 tablet 0  . ibuprofen (ADVIL,MOTRIN) 800 MG tablet Take 800 mg by mouth every 8 (eight) hours as needed for moderate pain.    Marland Kitchen loratadine (CLARITIN) 10 MG tablet Take 10 mg by mouth at bedtime.    Marland Kitchen losartan-hydrochlorothiazide (HYZAAR) 50-12.5 MG tablet TAKE ONE TABLET BY MOUTH EVERY DAY 90 tablet 1  . morphine (MS CONTIN) 30 MG 12 hr tablet Take 1 tablet by mouth every 12 (twelve) hours.    Marland Kitchen morphine (MS CONTIN) 60 MG 12 hr tablet Take 60 mg by mouth 2 (two) times daily as needed.    . Teriflunomide 14 MG TABS Take 14 mg by mouth at bedtime.     Marland Kitchen tiZANidine (ZANAFLEX) 4 MG tablet Take 4 mg by mouth every 6 (six) hours as needed for muscle spasms.     . VENTOLIN HFA 108 (90 Base) MCG/ACT inhaler Inhale 1 puff into the lungs every 6 (six) hours as needed for wheezing or shortness of breath. 18 g prn   No facility-administered medications prior to visit.    No Known Allergies  Review of Systems     Objective:    Physical Exam Musculoskeletal:     Comments: Right elbow with normal range of motion soft fluctuant round swelling at the tip of the olecranon on the right.  In the right ear there was a cystic area right along the edge of the cartilage going into the inner ear.  Like a scab had come off recently.     Ht 6' (1.829 m)   BMI 42.02 kg/m  Wt Readings from Last 3 Encounters:  03/20/20 (!) 309 lb 12.8 oz (140.5  kg)  12/21/19 297 lb (134.7 kg)  09/29/19 (!) 308 lb 3.2 oz (139.8 kg)    There are no preventive care reminders to display for this patient.  There are no preventive  care reminders to display for this patient.   Lab Results  Component Value Date   TSH 0.33 (L) 03/09/2018   Lab Results  Component Value Date   WBC 6.9 12/21/2019   HGB 14.3 12/21/2019   HCT 42.3 12/21/2019   MCV 87.6 12/21/2019   PLT 257 12/21/2019   Lab Results  Component Value Date   NA 137 12/21/2019   K 4.4 12/21/2019   CO2 29 12/21/2019   GLUCOSE 95 12/21/2019   BUN 19 12/21/2019   CREATININE 0.86 12/21/2019   BILITOT 0.4 12/21/2019   ALKPHOS 92 08/20/2016   AST 16 12/21/2019   ALT 15 12/21/2019   PROT 6.3 12/21/2019   ALBUMIN 4.3 08/20/2016   CALCIUM 9.7 12/21/2019   ANIONGAP 7 09/27/2019   Lab Results  Component Value Date   CHOL 165 08/03/2019   Lab Results  Component Value Date   HDL 51 08/03/2019   Lab Results  Component Value Date   LDLCALC 99 08/03/2019   Lab Results  Component Value Date   TRIG 61 08/03/2019   Lab Results  Component Value Date   CHOLHDL 3.2 08/03/2019   Lab Results  Component Value Date   HGBA1C 5.1 12/21/2019       Assessment & Plan:   Problem List Items Addressed This Visit   None   Visit Diagnoses    Olecranon bursitis of right elbow    -  Primary    Olecranon bursitis, right-recommend elevation, compression with an Ace wrap which was given today.  Ice and rest.  Avoid any direct pressure on the elbow such as resting on the armrest in the car or on a chair at home.  Make sure it is completely elevated off of what ever service he is resting his arm on so it is not putting pressure on it if it is not going down over the next couple weeks and we will get him in with sports med to drain the bursa.  Also given anti-inflammatory to take, meloxicam.  Epidural cyst in the right ear-we discussed options he is already had 2 rounds of antibiotics with  minimal improvement but not complete resolution we discussed numbing the area and doing an I&D.  Patient agreed.  Incision and Drainage Procedure Note Diagnosis: epidermoid cyst Location: same Informed Consent: Discussed risks (permanent scarring, light or dark discoloration, infection, pain, bleeding, bruising, redness, damage to adjacent structures, and recurrence of the lesion) and benefits of the procedure, as well as the alternatives.  Informed consent was obtained. Preparation: The area was prepared andin a standard fashion. Anesthesia: Lidocaine 1% without epinephrine Procedure Details: An incision was made overlying the lesion. The lesion drained clear fluid.  Antibiotic ointment and a sterile pressure dressing were applied. The patient tolerated procedure well. Total number of lesions drained: 1 Plan: The patient was instructed on post-op care. Recommend OTC analgesia as needed for pain.     Meds ordered this encounter  Medications  . meloxicam (MOBIC) 15 MG tablet    Sig: Take 1 tablet (15 mg total) by mouth daily.    Dispense:  30 tablet    Refill:  0     Nani Gasser, MD

## 2020-08-08 DIAGNOSIS — M542 Cervicalgia: Secondary | ICD-10-CM | POA: Diagnosis not present

## 2020-08-08 DIAGNOSIS — M5440 Lumbago with sciatica, unspecified side: Secondary | ICD-10-CM | POA: Diagnosis not present

## 2020-08-22 DIAGNOSIS — G4733 Obstructive sleep apnea (adult) (pediatric): Secondary | ICD-10-CM | POA: Diagnosis not present

## 2020-08-29 DIAGNOSIS — Z125 Encounter for screening for malignant neoplasm of prostate: Secondary | ICD-10-CM | POA: Diagnosis not present

## 2020-08-29 DIAGNOSIS — R351 Nocturia: Secondary | ICD-10-CM | POA: Diagnosis not present

## 2020-08-29 DIAGNOSIS — R3912 Poor urinary stream: Secondary | ICD-10-CM | POA: Diagnosis not present

## 2020-08-29 DIAGNOSIS — N401 Enlarged prostate with lower urinary tract symptoms: Secondary | ICD-10-CM | POA: Diagnosis not present

## 2020-08-29 DIAGNOSIS — R350 Frequency of micturition: Secondary | ICD-10-CM | POA: Diagnosis not present

## 2020-09-05 ENCOUNTER — Other Ambulatory Visit: Payer: Self-pay | Admitting: Family Medicine

## 2020-09-13 DIAGNOSIS — Z5181 Encounter for therapeutic drug level monitoring: Secondary | ICD-10-CM | POA: Diagnosis not present

## 2020-09-13 DIAGNOSIS — G8381 Brown-Sequard syndrome: Secondary | ICD-10-CM | POA: Diagnosis not present

## 2020-09-13 DIAGNOSIS — Z79899 Other long term (current) drug therapy: Secondary | ICD-10-CM | POA: Diagnosis not present

## 2020-09-13 DIAGNOSIS — G35 Multiple sclerosis: Secondary | ICD-10-CM | POA: Diagnosis not present

## 2020-09-14 DIAGNOSIS — G35 Multiple sclerosis: Secondary | ICD-10-CM | POA: Diagnosis not present

## 2020-10-12 DIAGNOSIS — N400 Enlarged prostate without lower urinary tract symptoms: Secondary | ICD-10-CM | POA: Diagnosis not present

## 2020-10-12 DIAGNOSIS — N5201 Erectile dysfunction due to arterial insufficiency: Secondary | ICD-10-CM | POA: Diagnosis not present

## 2020-10-17 DIAGNOSIS — R062 Wheezing: Secondary | ICD-10-CM | POA: Diagnosis not present

## 2020-10-23 DIAGNOSIS — M170 Bilateral primary osteoarthritis of knee: Secondary | ICD-10-CM | POA: Diagnosis not present

## 2020-10-31 DIAGNOSIS — M4802 Spinal stenosis, cervical region: Secondary | ICD-10-CM | POA: Diagnosis not present

## 2020-10-31 DIAGNOSIS — M47814 Spondylosis without myelopathy or radiculopathy, thoracic region: Secondary | ICD-10-CM | POA: Diagnosis not present

## 2020-10-31 DIAGNOSIS — M47813 Spondylosis without myelopathy or radiculopathy, cervicothoracic region: Secondary | ICD-10-CM | POA: Diagnosis not present

## 2020-10-31 DIAGNOSIS — M5134 Other intervertebral disc degeneration, thoracic region: Secondary | ICD-10-CM | POA: Diagnosis not present

## 2020-10-31 DIAGNOSIS — M4804 Spinal stenosis, thoracic region: Secondary | ICD-10-CM | POA: Diagnosis not present

## 2020-10-31 DIAGNOSIS — M47812 Spondylosis without myelopathy or radiculopathy, cervical region: Secondary | ICD-10-CM | POA: Diagnosis not present

## 2020-11-01 DIAGNOSIS — M5440 Lumbago with sciatica, unspecified side: Secondary | ICD-10-CM | POA: Diagnosis not present

## 2020-11-01 DIAGNOSIS — M542 Cervicalgia: Secondary | ICD-10-CM | POA: Diagnosis not present

## 2020-11-01 DIAGNOSIS — M5412 Radiculopathy, cervical region: Secondary | ICD-10-CM | POA: Diagnosis not present

## 2020-11-01 DIAGNOSIS — M546 Pain in thoracic spine: Secondary | ICD-10-CM | POA: Diagnosis not present

## 2020-12-15 ENCOUNTER — Other Ambulatory Visit: Payer: Self-pay | Admitting: Family Medicine

## 2020-12-28 DIAGNOSIS — Z6841 Body Mass Index (BMI) 40.0 and over, adult: Secondary | ICD-10-CM | POA: Diagnosis not present

## 2020-12-28 DIAGNOSIS — Z9989 Dependence on other enabling machines and devices: Secondary | ICD-10-CM | POA: Diagnosis not present

## 2020-12-28 DIAGNOSIS — G4733 Obstructive sleep apnea (adult) (pediatric): Secondary | ICD-10-CM | POA: Diagnosis not present

## 2021-01-16 DIAGNOSIS — M542 Cervicalgia: Secondary | ICD-10-CM | POA: Diagnosis not present

## 2021-01-16 DIAGNOSIS — G35 Multiple sclerosis: Secondary | ICD-10-CM | POA: Diagnosis not present

## 2021-01-16 DIAGNOSIS — M5412 Radiculopathy, cervical region: Secondary | ICD-10-CM | POA: Diagnosis not present

## 2021-01-16 DIAGNOSIS — M546 Pain in thoracic spine: Secondary | ICD-10-CM | POA: Diagnosis not present

## 2021-01-21 IMAGING — RF DG C-ARM 1-60 MIN
1 series · 1 of 1 positions shown · non-contrast
Comparison: MR cervical spine dated 07/08/2019

FLUOROSCOPY TIME:  7 seconds

CLINICAL DATA: C4-5 ACDF

EXAM:
DG CERVICAL SPINE - 1 VIEW; DG C-ARM 1-60 MIN

[Series 1: unknown protocol · left · 0.14mm/px · 1 of 1 slices shown]
[im 1/1]
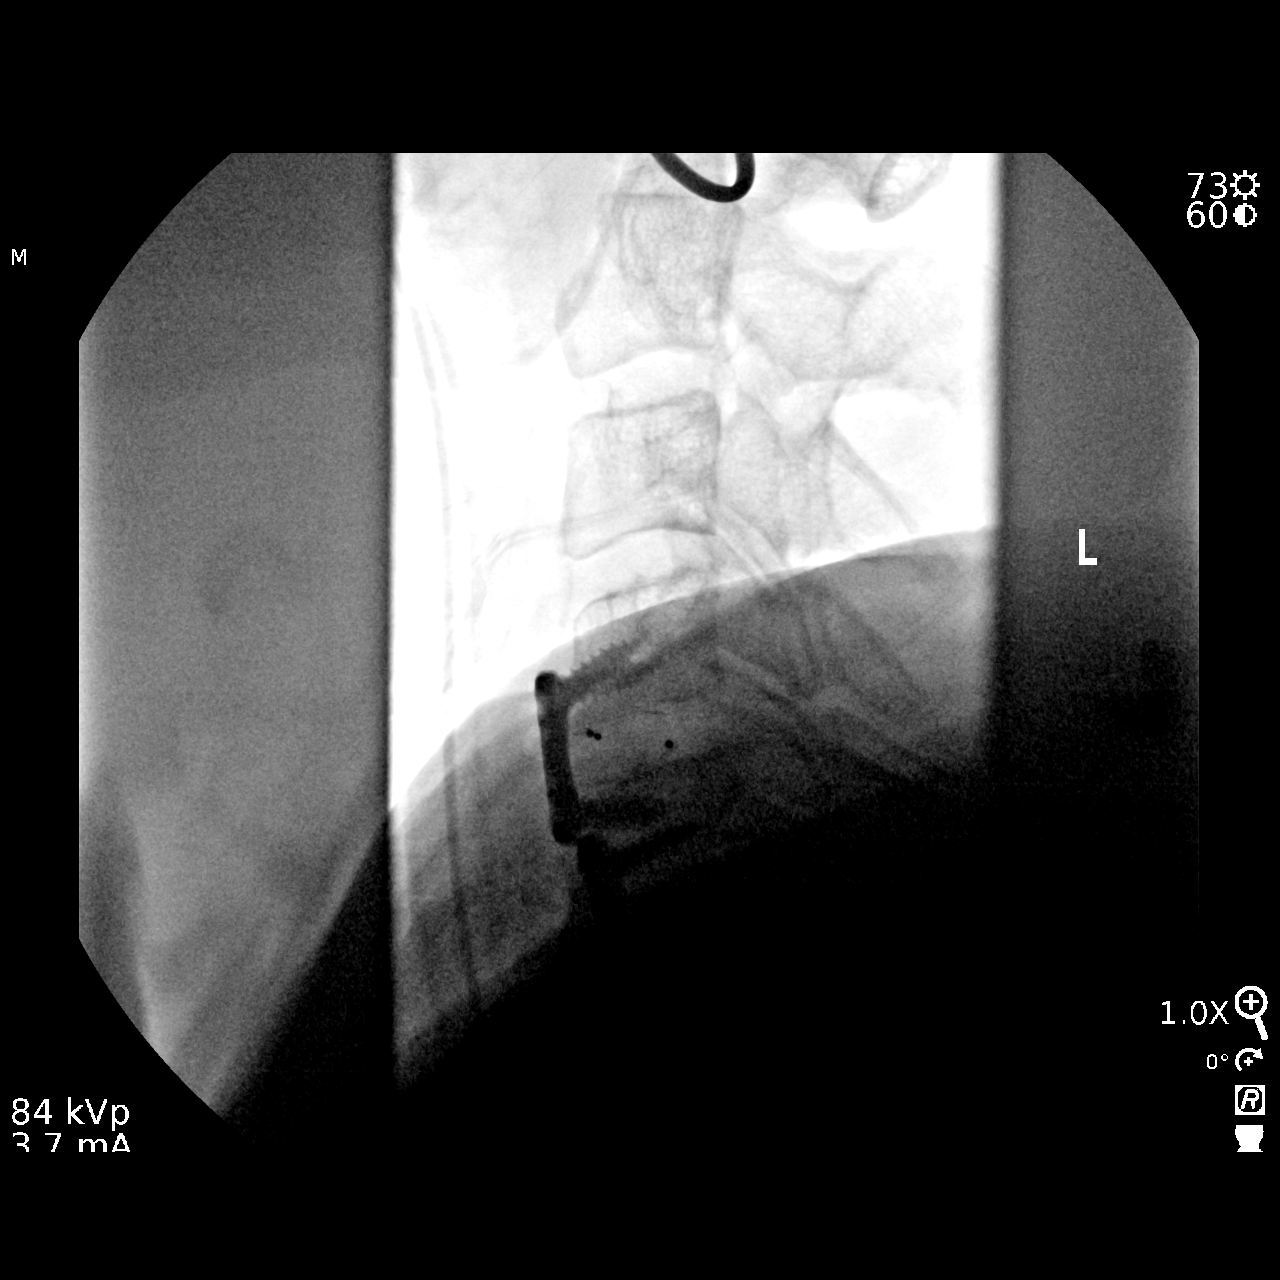

[1 of 1 positions shown; findings below may reference images not displayed]

FINDINGS: Single lateral fluoroscopic image demonstrating C4-5 ACDF with
interbody spacer in satisfactory position.

Prior C5-7 ACDF, incompletely visualized.
IMPRESSION: Intraoperative fluoroscopic images during C4-5 ACDF, as above.

## 2021-02-07 DIAGNOSIS — M25561 Pain in right knee: Secondary | ICD-10-CM | POA: Diagnosis not present

## 2021-02-07 DIAGNOSIS — M25562 Pain in left knee: Secondary | ICD-10-CM | POA: Diagnosis not present

## 2021-02-07 DIAGNOSIS — M25571 Pain in right ankle and joints of right foot: Secondary | ICD-10-CM | POA: Diagnosis not present

## 2021-02-07 DIAGNOSIS — M25572 Pain in left ankle and joints of left foot: Secondary | ICD-10-CM | POA: Diagnosis not present

## 2021-02-08 ENCOUNTER — Telehealth: Payer: Self-pay

## 2021-02-08 NOTE — Telephone Encounter (Signed)
   [facility] Pre-operative Risk Assessment    Patient Name: Mark [name].  DOB: 1960-11-28 MRN: [id number]  HEARTCARE STAFF:  - IMPORTANT!!!!!! Under Visit Info/Reason for Call, type in Other and utilize the format Clearance MM/DD/YY or Clearance TBD. Do not use dashes or single digits. - Please review there is not already an duplicate clearance open for this procedure. - If request is for dental extraction, please clarify the # of teeth to be extracted. - If the patient is currently at the dentist's office, call Pre-Op Callback Staff (MA/nurse) to input urgent request.  - If the patient is not currently in the dentist office, please route to the Pre-Op pool.  Request for surgical clearance:  What type of surgery is being performed? LT TOTAL KNEE REPLACEMENT  When is this surgery scheduled? TBD  What type of clearance is required (medical clearance vs. Pharmacy clearance to hold med vs. Both)? MEDICAL  Are there any medications that need to be held prior to surgery and how long? NO  Practice name and name of physician performing surgery? Raliegh Ip Caprice Kluver, MD ORTHO ATTN:KELLY  What is the office phone number? [phone]   7.   What is the office fax number? [phone]  8.   Anesthesia type (None, local, MAC, general) ? NOT LISTED-UNABLE TO LEAVE VM   Waylan Rocher 02/08/2021, 2:41 PM  _________________________________________________________________   (provider comments below)

## 2021-02-09 ENCOUNTER — Telehealth: Payer: Self-pay | Admitting: Family Medicine

## 2021-02-09 NOTE — Telephone Encounter (Signed)
Called patient to schedule appointment and he stated he has to get other things squared away first and will call back to schedule this at a later time. AM

## 2021-02-09 NOTE — Telephone Encounter (Signed)
Call pt: needs appt for pre-op

## 2021-02-12 NOTE — Telephone Encounter (Signed)
Primary Cardiologist:Brian Stanford Breed, MD  Chart reviewed as part of pre-operative protocol coverage. Because of [name] Cosey Jr.'s past medical history and time since last visit, he/she will require a follow-up visit in order to better assess preoperative cardiovascular risk.  Pre-op covering staff: - Please schedule appointment and call patient to inform them. - Please contact requesting surgeon's office via preferred method (i.e, phone, fax) to inform them of need for appointment prior to surgery.  If applicable, this message will also be routed to pharmacy pool and/or primary cardiologist for input on holding anticoagulant/antiplatelet agent as requested below so that this information is available at time of patient's appointment.   Deberah Pelton, NP  02/12/2021, 11:23 AM

## 2021-02-13 DIAGNOSIS — M546 Pain in thoracic spine: Secondary | ICD-10-CM | POA: Diagnosis not present

## 2021-02-13 DIAGNOSIS — I10 Essential (primary) hypertension: Secondary | ICD-10-CM | POA: Diagnosis not present

## 2021-02-13 DIAGNOSIS — Z6839 Body mass index (BMI) 39.0-39.9, adult: Secondary | ICD-10-CM | POA: Diagnosis not present

## 2021-02-13 DIAGNOSIS — M542 Cervicalgia: Secondary | ICD-10-CM | POA: Diagnosis not present

## 2021-02-13 NOTE — Telephone Encounter (Signed)
Reviewed patients chart and saw the patient already has an appointment scheduled with Dr Stanford Breed in September. I told him we did not have anything sooner and his clearance could be addressed at that time. Patient voiced understanding and stated he does not want to get clearance right away, but it will be fine to do in September. He thanked me for calling.

## 2021-02-15 DIAGNOSIS — R2242 Localized swelling, mass and lump, left lower limb: Secondary | ICD-10-CM | POA: Diagnosis not present

## 2021-02-20 ENCOUNTER — Other Ambulatory Visit: Payer: Self-pay | Admitting: Family Medicine

## 2021-02-20 ENCOUNTER — Ambulatory Visit (INDEPENDENT_AMBULATORY_CARE_PROVIDER_SITE_OTHER): Payer: Medicare Other

## 2021-02-20 ENCOUNTER — Ambulatory Visit (INDEPENDENT_AMBULATORY_CARE_PROVIDER_SITE_OTHER): Payer: Medicare Other | Admitting: Family Medicine

## 2021-02-20 ENCOUNTER — Other Ambulatory Visit: Payer: Self-pay

## 2021-02-20 ENCOUNTER — Encounter: Payer: Self-pay | Admitting: Family Medicine

## 2021-02-20 VITALS — BP 119/64 | HR 76 | Temp 98.4°F | Ht 72.0 in | Wt 300.0 lb

## 2021-02-20 DIAGNOSIS — I10 Essential (primary) hypertension: Secondary | ICD-10-CM | POA: Diagnosis not present

## 2021-02-20 DIAGNOSIS — L603 Nail dystrophy: Secondary | ICD-10-CM | POA: Diagnosis not present

## 2021-02-20 DIAGNOSIS — R229 Localized swelling, mass and lump, unspecified: Secondary | ICD-10-CM | POA: Diagnosis not present

## 2021-02-20 DIAGNOSIS — R350 Frequency of micturition: Secondary | ICD-10-CM | POA: Diagnosis not present

## 2021-02-20 DIAGNOSIS — R2242 Localized swelling, mass and lump, left lower limb: Secondary | ICD-10-CM | POA: Diagnosis not present

## 2021-02-20 NOTE — Addendum Note (Signed)
Addended by: Beatrice Lecher D on: 02/20/2021 12:46 PM   Modules accepted: Orders

## 2021-02-20 NOTE — Assessment & Plan Note (Signed)
Check for iron deficiency as well as B12 deficiency.  Unclear if this could be related to his MS.

## 2021-02-20 NOTE — Progress Notes (Addendum)
Acute Office Visit  Subjective:    Patient ID: Mark [name]., male    DOB: 1960/12/20, 60 y.o.   MRN: [id number]  Chief Complaint  Patient presents with   Other    Knot on back of leg    HPI Patient is in today for knot on back. knot under skin on outer left lower leg near the knee x 3 months. Not painful.  He said he did go to an urgent care to have it looked at they did an x-ray and it was negative but they were not sure what might be causing it.  As it does seem to wax and wane in size but never goes completely away.  No known injury or trauma  He also wanted me to look at his fingernails today he says that they are flat he says been like that for very long time probably years he is just never really mentioned it.  He also would like to have his urine checked has been having some frequency no dysuria burning or blood.  Past Medical History:  Diagnosis Date   Anxiety    chronic pain treated with narcotics   Arthritis    Chronic pain    Constipation due to opioid therapy    Depression    Dysphonia    thoracic left sided"impingement" with heminumbness   Dyspnea    GI bleed    abt 15 years ago   Morbid obesity ([place])    MS (multiple sclerosis) ([place]) 02/11/2013   [name]   Pneumonia    Sleep apnea with use of continuous positive airway pressure (CPAP) 02/11/2013   07-13-12 AHi of 98.6 /hr titrated to 11 cm water ,  average user time 4 hours and 4 minutes. Residual AHI 1.20 December 2012 .   Umbilical hernia     Past Surgical History:  Procedure Laterality Date   ANTERIOR CERVICAL DECOMP/DISCECTOMY FUSION N/A 09/29/2019   Procedure: Anterior Cervical Decompression Fusion - Cervical Four-Cervical Five;  Surgeon: [name], MD;  Location: [facility];  Service: Neurosurgery;  Laterality: N/A;  Anterior Cervical Decompression Fusion - Cervical Four-Cervical Five    CARPAL TUNNEL RELEASE Right    [name] TUNNEL RELEASE Right 09/29/2019   Procedure: Carpal  Tunnel Release;  Surgeon: [name], MD;  Location: [facility];  Service: Neurosurgery;  Laterality: Right;   COLONOSCOPY W/ POLYPECTOMY     ESOPHAGOGASTRODUODENOSCOPY     MULTIPLE TOOTH EXTRACTIONS     POSTERIOR LUMBAR FUSION  04/17/15   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   Patient has two adult children.   Patient is right-handed.   Patient is disabled.   Patient has a college education.   Patient drinks a gallon of tea daily.      Social Determinants of Health   Financial Resource Strain: Not on file  Food Insecurity: Not on file  Transportation Needs: Not on file  Physical Activity: Not on file  Stress: Not on file  Social Connections: Not on file  Intimate Partner Violence: Not on file    Outpatient Medications Prior to Visit  Medication Sig Dispense Refill   amantadine (SYMMETREL) 100 MG capsule Take 100 mg by mouth 3 (three) times daily.      amphetamine-dextroamphetamine (ADDERALL XR) 30 MG 24 hr capsule Take 30 mg by mouth daily.      cyclobenzaprine (FLEXERIL)  10 MG tablet Take 1 tablet (10 mg total) by mouth 3 (three) times daily as needed for muscle spasms. 30 tablet 0   DULoxetine (CYMBALTA) 60 MG capsule Take 60 mg by mouth 2 (two) times daily.      gabapentin (NEURONTIN) 800 MG tablet Take 800 mg by mouth 4 (four) times daily.  11   HYDROcodone-acetaminophen (NORCO) 10-325 MG tablet Take 2 tablets by mouth every 4 (four) hours as needed for severe pain. 40 tablet 0   ibuprofen (ADVIL,MOTRIN) 800 MG tablet Take 800 mg by mouth every 8 (eight) hours as needed for moderate pain.     losartan-hydrochlorothiazide (HYZAAR) 50-12.5 MG tablet TAKE ONE TABLET BY MOUTH EVERY DAY 90 tablet 1   morphine (MS CONTIN) 30 MG 12 hr tablet Take 1 tablet by mouth every 12 (twelve) hours.     Teriflunomide 14 MG TABS Take 14 mg by mouth at bedtime.      tiZANidine (ZANAFLEX) 4 MG tablet Take 4 mg by mouth every 6 (six) hours as needed for muscle spasms.      fluticasone furoate-vilanterol (BREO ELLIPTA) 100-25 MCG/INH AEPB Inhale 1 puff into the lungs daily as needed (shortness of breath).      loratadine (CLARITIN) 10 MG tablet Take 10 mg by mouth at bedtime.     meloxicam (MOBIC) 15 MG tablet Take 1 tablet (15 mg total) by mouth daily. 30 tablet 0   morphine (MS CONTIN) 60 MG 12 hr tablet Take 60 mg by mouth 2 (two) times daily as needed.     VENTOLIN HFA 108 (90 Base) MCG/ACT inhaler Inhale 1 puff into the lungs every 6 (six) hours as needed for wheezing or shortness of breath. 18 g prn   No facility-administered medications prior to visit.    No Known Allergies  Review of Systems     Objective:    Physical Exam Skin:    Comments: Spooning of fingernails.   knot under skin on outer left lower leg near the knee.  There are actually 2 areas once a little bit larger than the other but they do feel like they connect.  There is a small papule towards the middle of the larger lesion.    BP 119/64   Pulse 76   Temp 98.4 F (36.9 C) (Oral)   Ht 6' (1.829 m)    Wt 300 lb (136.1 kg)   SpO2 96% Comment: on  RA  BMI 40.69 kg/m  Wt Readings from Last 3 Encounters:  02/20/21 300 lb (136.1 kg)  07/27/20 (!) 301 lb (136.5 kg)  03/20/20 (!) 309 lb 12.8 oz (140.5 kg)    Health Maintenance Due  Topic Date Due   COVID-19 Vaccine (1) Never done   Pneumococcal Vaccine 82-46 Years old (1 - PCV) Never done   Zoster Vaccines- Shingrix (1 of 2) Never done   COLONOSCOPY (Pts 45-54yr Insurance coverage will need to be confirmed)  11/02/2018   INFLUENZA VACCINE  02/19/2021    There are no preventive care reminders to display for this patient.   Lab Results  Component Value Date   TSH 0.33 (L) 03/09/2018   Lab Results  Component Value Date   WBC 6.9 12/21/2019   HGB 14.3 12/21/2019   HCT 42.3 12/21/2019   MCV 87.6 12/21/2019   PLT 257 12/21/2019   Lab Results  Component Value Date   NA 137 12/21/2019   K 4.4 12/21/2019   CO2 29 12/21/2019   GLUCOSE 95 12/21/2019   BUN 19 12/21/2019   CREATININE 0.86 12/21/2019   BILITOT 0.4 12/21/2019   ALKPHOS 92 08/20/2016   AST 16 12/21/2019   ALT 15 12/21/2019   PROT 6.3 12/21/2019   ALBUMIN 4.3 08/20/2016   CALCIUM 9.7 12/21/2019   ANIONGAP 7 09/27/2019   Lab Results  Component Value Date   CHOL 165 08/03/2019   Lab Results  Component Value Date   HDL 51 08/03/2019   Lab Results  Component Value Date   LDLCALC 99 08/03/2019   Lab Results  Component Value Date   TRIG 61 08/03/2019   Lab Results  Component Value Date   CHOLHDL 3.2 08/03/2019   Lab Results  Component Value Date   HGBA1C 5.1 12/21/2019       Assessment & Plan:   Problem List Items Addressed This Visit       Cardiovascular and Mediastinum   Essential hypertension    Well controlled. Continue current regimen. Follow up in  6 mo. Due for labs.         Relevant Orders   Lipid panel   COMPLETE METABOLIC PANEL WITH GFR   CBC   Fe+TIBC+Fer   B12   Vitamin B6   Vitamin B1     Musculoskeletal and  Integument   Spoon nails    Check for iron deficiency as well as B12 deficiency.  Unclear if this could be related to his MS.       Relevant Orders   Lipid panel   COMPLETE METABOLIC PANEL WITH GFR   CBC   Fe+TIBC+Fer   B12   Vitamin B6   Vitamin B1   Other Visit Diagnoses     Mass of skin    -  Primary   Urinary frequency       Relevant Orders   Urinalysis, Routine w reflex microscopic      Mass of skin -unclear etiology consider sebaceous cyst versus mass.  He reports he had normal x-rays so I was unable to find those in the care everywhere system.  But that would rule out any type of bony lesion.  There is no pain or erythema to suggest a superficial venous clot and it is quite large.  Urinary frequency-we will check urinalysis though UTI is unlikely.  Consider it could be related to enlarged prostate gland.  He does follow with urology.  No orders of the defined types were placed  in this encounter.    Beatrice Lecher, MD

## 2021-02-20 NOTE — Assessment & Plan Note (Signed)
Well controlled. Continue current regimen. Follow up in  6 mo. Due for labs.  

## 2021-02-21 DIAGNOSIS — I10 Essential (primary) hypertension: Secondary | ICD-10-CM | POA: Diagnosis not present

## 2021-02-21 DIAGNOSIS — L603 Nail dystrophy: Secondary | ICD-10-CM | POA: Diagnosis not present

## 2021-02-23 ENCOUNTER — Encounter: Payer: Self-pay | Admitting: Family Medicine

## 2021-02-23 DIAGNOSIS — R229 Localized swelling, mass and lump, unspecified: Secondary | ICD-10-CM

## 2021-02-26 LAB — CBC
HCT: 42.6 % (ref 38.5–50.0)
Hemoglobin: 14.3 g/dL (ref 13.2–17.1)
MCH: 29 pg (ref 27.0–33.0)
MCHC: 33.6 g/dL (ref 32.0–36.0)
MCV: 86.4 fL (ref 80.0–100.0)
MPV: 10.2 fL (ref 7.5–12.5)
Platelets: 249 10*3/uL (ref 140–400)
RBC: 4.93 10*6/uL (ref 4.20–5.80)
RDW: 12.9 % (ref 11.0–15.0)
WBC: 7.7 10*3/uL (ref 3.8–10.8)

## 2021-02-26 LAB — LIPID PANEL
Cholesterol: 145 mg/dL (ref <200)
HDL: 55 mg/dL (ref >=40)
LDL Cholesterol (Calc): 77 mg/dL (calc)
Non-HDL Cholesterol (Calc): 90 mg/dL (calc) (ref <130)
Total CHOL/HDL Ratio: 2.6 (calc) (ref <5.0)
Triglycerides: 52 mg/dL (ref <150)

## 2021-02-26 LAB — COMPLETE METABOLIC PANEL WITH GFR
AG Ratio: 2.2 (calc) (ref 1.0–2.5)
ALT: 14 U/L (ref 9–46)
AST: 13 U/L (ref 10–35)
Albumin: 4.4 g/dL (ref 3.6–5.1)
Alkaline phosphatase (APISO): 102 U/L (ref 35–144)
BUN/Creatinine Ratio: 35 (calc) — ABNORMAL HIGH (ref 6–22)
BUN: 26 mg/dL — ABNORMAL HIGH (ref 7–25)
CO2: 35 mmol/L — ABNORMAL HIGH (ref 20–32)
Calcium: 9.7 mg/dL (ref 8.6–10.3)
Chloride: 99 mmol/L (ref 98–110)
Creat: 0.74 mg/dL (ref 0.70–1.30)
Globulin: 2 g/dL (calc) (ref 1.9–3.7)
Glucose, Bld: 92 mg/dL (ref 65–99)
Potassium: 4.1 mmol/L (ref 3.5–5.3)
Sodium: 139 mmol/L (ref 135–146)
Total Bilirubin: 0.5 mg/dL (ref 0.2–1.2)
Total Protein: 6.4 g/dL (ref 6.1–8.1)
eGFR: 104 mL/min/{1.73_m2} (ref >=60)

## 2021-02-26 LAB — VITAMIN B12: Vitamin B-12: 406 pg/mL (ref 200–1100)

## 2021-02-26 LAB — VITAMIN B6: Vitamin B6: 4.6 ng/mL (ref 2.1–21.7)

## 2021-02-26 LAB — IRON,TIBC AND FERRITIN PANEL
%SAT: 26 % (calc) (ref 20–48)
Ferritin: 170 ng/mL (ref 38–380)
Iron: 82 ug/dL (ref 50–180)
TIBC: 312 mcg/dL (calc) (ref 250–425)

## 2021-02-26 LAB — VITAMIN B1: Vitamin B1 (Thiamine): 13 nmol/L (ref 8–30)

## 2021-03-01 NOTE — Telephone Encounter (Signed)
Patient has decided he would like to proceed with MRI since [place] of lower extremity mass was inconclusive. Orders placed.

## 2021-03-04 ENCOUNTER — Ambulatory Visit (INDEPENDENT_AMBULATORY_CARE_PROVIDER_SITE_OTHER): Payer: Medicare Other

## 2021-03-04 ENCOUNTER — Other Ambulatory Visit: Payer: Self-pay

## 2021-03-04 DIAGNOSIS — M23307 Other meniscus derangements, unspecified meniscus, left knee: Secondary | ICD-10-CM

## 2021-03-04 DIAGNOSIS — M25562 Pain in left knee: Secondary | ICD-10-CM | POA: Diagnosis not present

## 2021-03-08 ENCOUNTER — Encounter: Payer: Self-pay | Admitting: Family Medicine

## 2021-03-08 NOTE — Progress Notes (Signed)
MyChart message sent: It appears that the area of concern that you showed Dr. Madilyn Fireman is a ganglion cyst which is benign. However, there is a lot going on in your knee based on this MRI. If you are having trouble with your knee, it would be beneficial to see an orthopedic doctor. Do you have anybody particular you would like me to send the referral to?

## 2021-04-02 ENCOUNTER — Ambulatory Visit
Admission: RE | Admit: 2021-04-02 | Discharge: 2021-04-02 | Disposition: A | Discharge status: Home / Self Care | Payer: Medicare Other | Source: Ambulatory Visit | Attending: Cardiology | Admitting: Cardiology

## 2021-04-02 DIAGNOSIS — I712 Thoracic aortic aneurysm, without rupture, unspecified: Secondary | ICD-10-CM

## 2021-04-02 DIAGNOSIS — R911 Solitary pulmonary nodule: Secondary | ICD-10-CM | POA: Diagnosis not present

## 2021-04-02 DIAGNOSIS — I7121 Aneurysm of the ascending aorta, without rupture: Secondary | ICD-10-CM

## 2021-04-02 DIAGNOSIS — Z981 Arthrodesis status: Secondary | ICD-10-CM | POA: Diagnosis not present

## 2021-04-02 MED ORDER — IOPAMIDOL (ISOVUE-370) INJECTION 76%
75.0000 mL | Freq: Once | INTRAVENOUS | Status: AC | PRN
  Administered 2021-04-02: 75 mL via INTRAVENOUS

## 2021-04-08 NOTE — Progress Notes (Addendum)
HPI: FU hypertension and TAA.  Outside echocardiogram October 2019 showed normal LV function and severe left ventricular hypertrophy.  Plan at previous office visit was to control blood pressure and repeat echocardiogram in the future.  I also reviewed previous outside echocardiogram and felt left ventricular hypertrophy was mild with mild proximal septal thickening.  Echocardiogram December 2020 showed normal LV function and moderately dilated ascending aorta measuring 49 mm. CTA September 2022 showed ascending thoracic aortic aneurysm measuring 4.4 cm. Since last seen, there is no dyspnea, chest pain, palpitations or syncope.  Current Outpatient Medications  Medication Sig Dispense Refill   amantadine (SYMMETREL) 100 MG capsule Take 100 mg by mouth 3 (three) times daily.      amphetamine-dextroamphetamine (ADDERALL XR) 30 MG 24 hr capsule Take 30 mg by mouth daily.      cyclobenzaprine (FLEXERIL) 10 MG tablet Take 1 tablet (10 mg total) by mouth 3 (three) times daily as needed for muscle spasms. 30 tablet 0   DULoxetine (CYMBALTA) 60 MG capsule Take 60 mg by mouth 2 (two) times daily.      gabapentin (NEURONTIN) 800 MG tablet Take 800 mg by mouth 4 (four) times daily.  11   HYDROcodone-acetaminophen (NORCO) 10-325 MG tablet Take 2 tablets by mouth every 4 (four) hours as needed for severe pain. 40 tablet 0   ibuprofen (ADVIL,MOTRIN) 800 MG tablet Take 800 mg by mouth every 8 (eight) hours as needed for moderate pain.     losartan-hydrochlorothiazide (HYZAAR) 50-12.5 MG tablet TAKE ONE TABLET BY MOUTH EVERY DAY 90 tablet 1   morphine (MS CONTIN) 30 MG 12 hr tablet Take 1 tablet by mouth every 12 (twelve) hours.     Teriflunomide 14 MG TABS Take 14 mg by mouth at bedtime.      tiZANidine (ZANAFLEX) 4 MG tablet Take 4 mg by mouth every 6 (six) hours as needed for muscle spasms.      No current facility-administered medications for this visit.     Past Medical History:  Diagnosis Date    Anxiety    chronic pain treated with narcotics   Arthritis    Chronic pain    Constipation due to opioid therapy    Depression    Dysphonia    thoracic left sided"impingement" with heminumbness   Dyspnea    GI bleed    abt 15 years ago   Morbid obesity ([place])    MS (multiple sclerosis) ([place]) 02/11/2013   [name]   Pneumonia    Sleep apnea with use of continuous positive airway pressure (CPAP) 02/11/2013   07-13-12 AHi of 98.6 /hr titrated to 11 cm water ,  average user time 4 hours and 4 minutes. Residual AHI 1.20 December 2012 .   Umbilical hernia     Past Surgical History:  Procedure Laterality Date   ANTERIOR CERVICAL DECOMP/DISCECTOMY FUSION N/A 09/29/2019   Procedure: Anterior Cervical Decompression Fusion - Cervical Four-Cervical Five;  Surgeon: [name], MD;  Location: [facility];  Service: Neurosurgery;  Laterality: N/A;  Anterior Cervical Decompression Fusion - Cervical Four-Cervical Five    CARPAL TUNNEL RELEASE Right    [name] TUNNEL RELEASE Right 09/29/2019   Procedure: Carpal Tunnel Release;  Surgeon: [name], MD;  Location: [facility];  Service: Neurosurgery;  Laterality: Right;   COLONOSCOPY W/ POLYPECTOMY     ESOPHAGOGASTRODUODENOSCOPY     MULTIPLE TOOTH EXTRACTIONS     POSTERIOR LUMBAR FUSION  04/17/15   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   Patient has two adult children.   Patient is right-handed.   Patient is disabled.   Patient has a college education.   Patient drinks a gallon of tea daily.      Social Determinants of Health   Financial Resource Strain: Not on file  Food Insecurity: Not on file  Transportation Needs: Not on file  Physical Activity: Not on file  Stress: Not on file  Social Connections: Not on file  Intimate Partner Violence: Not on file    Family History  Problem Relation Age of Onset   Arrhythmia Mother    Cancer Other    Cancer Other    Sleep walking Son     ROS: no fevers or chills, productive cough, hemoptysis, dysphasia, odynophagia, melena, hematochezia, dysuria, hematuria, rash, seizure activity, orthopnea, PND, pedal edema, claudication. Remaining systems are negative.  Physical Exam: Well-developed well-nourished in no acute distress.  Skin is warm and dry.  HEENT is normal.  Neck is supple.  Chest is clear to auscultation with normal expansion.  Cardiovascular exam is regular rate and rhythm.  Abdominal exam nontender or distended. No masses palpated. Extremities show no edema. neuro grossly intact  ECG-normal sinus rhythm at a rate of 78, right axis deviation.  Personally reviewed  A/P  1 thoracic aortic aneurysm-patient will need follow-up CTA September 2023.  2 hypertension-blood pressure controlled.  Continue present medications and follow.  3 obesity-needs diet, exercise and weight loss.  4 obstructive sleep apnea-continue CPAP.  Kirk Ruths, MD

## 2021-04-11 ENCOUNTER — Encounter: Payer: Self-pay | Admitting: Cardiology

## 2021-04-11 ENCOUNTER — Other Ambulatory Visit: Payer: Self-pay

## 2021-04-11 ENCOUNTER — Ambulatory Visit: Payer: Medicare Other | Admitting: Cardiology

## 2021-04-11 VITALS — BP 128/62 | HR 78 | Ht 72.0 in | Wt 300.0 lb

## 2021-04-11 DIAGNOSIS — I712 Thoracic aortic aneurysm, without rupture, unspecified: Secondary | ICD-10-CM

## 2021-04-11 DIAGNOSIS — I10 Essential (primary) hypertension: Secondary | ICD-10-CM | POA: Diagnosis not present

## 2021-04-11 NOTE — Patient Instructions (Signed)

## 2021-04-16 DIAGNOSIS — M5440 Lumbago with sciatica, unspecified side: Secondary | ICD-10-CM | POA: Diagnosis not present

## 2021-04-16 DIAGNOSIS — M546 Pain in thoracic spine: Secondary | ICD-10-CM | POA: Diagnosis not present

## 2021-04-16 DIAGNOSIS — M542 Cervicalgia: Secondary | ICD-10-CM | POA: Diagnosis not present

## 2021-04-27 ENCOUNTER — Telehealth: Payer: Self-pay | Admitting: *Deleted

## 2021-04-27 NOTE — Telephone Encounter (Signed)
Pt's wife called and asked if she could get the images of his mri burned to a disk to take to the Orthopedist. I informed her that I would call radiology and ask them to do this.  Spoke w/Norman and he stated that he could do this and have this ready for them.  Called pt's wife back and informed her of this.

## 2021-05-15 DIAGNOSIS — M1711 Unilateral primary osteoarthritis, right knee: Secondary | ICD-10-CM | POA: Diagnosis not present

## 2021-05-15 DIAGNOSIS — M1712 Unilateral primary osteoarthritis, left knee: Secondary | ICD-10-CM | POA: Diagnosis not present

## 2021-05-15 DIAGNOSIS — M25561 Pain in right knee: Secondary | ICD-10-CM | POA: Diagnosis not present

## 2021-05-15 DIAGNOSIS — M25562 Pain in left knee: Secondary | ICD-10-CM | POA: Diagnosis not present

## 2021-05-23 DIAGNOSIS — R0902 Hypoxemia: Secondary | ICD-10-CM | POA: Diagnosis not present

## 2021-05-23 DIAGNOSIS — R0602 Shortness of breath: Secondary | ICD-10-CM | POA: Diagnosis not present

## 2021-05-23 DIAGNOSIS — J9601 Acute respiratory failure with hypoxia: Secondary | ICD-10-CM | POA: Diagnosis not present

## 2021-05-23 DIAGNOSIS — B974 Respiratory syncytial virus as the cause of diseases classified elsewhere: Secondary | ICD-10-CM | POA: Diagnosis not present

## 2021-05-23 DIAGNOSIS — J986 Disorders of diaphragm: Secondary | ICD-10-CM | POA: Diagnosis not present

## 2021-05-23 DIAGNOSIS — I872 Venous insufficiency (chronic) (peripheral): Secondary | ICD-10-CM | POA: Diagnosis not present

## 2021-05-23 DIAGNOSIS — I10 Essential (primary) hypertension: Secondary | ICD-10-CM | POA: Diagnosis not present

## 2021-05-23 DIAGNOSIS — J449 Chronic obstructive pulmonary disease, unspecified: Secondary | ICD-10-CM | POA: Diagnosis not present

## 2021-05-23 DIAGNOSIS — G4730 Sleep apnea, unspecified: Secondary | ICD-10-CM | POA: Diagnosis not present

## 2021-05-23 DIAGNOSIS — Z79899 Other long term (current) drug therapy: Secondary | ICD-10-CM | POA: Diagnosis not present

## 2021-05-23 DIAGNOSIS — Z20822 Contact with and (suspected) exposure to covid-19: Secondary | ICD-10-CM | POA: Diagnosis not present

## 2021-05-23 DIAGNOSIS — G35 Multiple sclerosis: Secondary | ICD-10-CM | POA: Diagnosis not present

## 2021-05-23 DIAGNOSIS — N400 Enlarged prostate without lower urinary tract symptoms: Secondary | ICD-10-CM | POA: Diagnosis not present

## 2021-05-23 DIAGNOSIS — J205 Acute bronchitis due to respiratory syncytial virus: Secondary | ICD-10-CM | POA: Diagnosis not present

## 2021-05-23 DIAGNOSIS — R062 Wheezing: Secondary | ICD-10-CM | POA: Diagnosis not present

## 2021-05-23 DIAGNOSIS — G8929 Other chronic pain: Secondary | ICD-10-CM | POA: Diagnosis not present

## 2021-05-24 DIAGNOSIS — Z6841 Body Mass Index (BMI) 40.0 and over, adult: Secondary | ICD-10-CM | POA: Diagnosis not present

## 2021-05-24 DIAGNOSIS — G4733 Obstructive sleep apnea (adult) (pediatric): Secondary | ICD-10-CM | POA: Diagnosis not present

## 2021-05-24 DIAGNOSIS — J205 Acute bronchitis due to respiratory syncytial virus: Secondary | ICD-10-CM | POA: Diagnosis not present

## 2021-05-24 DIAGNOSIS — I10 Essential (primary) hypertension: Secondary | ICD-10-CM | POA: Diagnosis not present

## 2021-05-24 DIAGNOSIS — R0602 Shortness of breath: Secondary | ICD-10-CM | POA: Diagnosis not present

## 2021-05-24 DIAGNOSIS — J9601 Acute respiratory failure with hypoxia: Secondary | ICD-10-CM | POA: Diagnosis not present

## 2021-05-29 ENCOUNTER — Encounter: Payer: Self-pay | Admitting: Family Medicine

## 2021-05-29 ENCOUNTER — Other Ambulatory Visit: Payer: Self-pay

## 2021-05-29 ENCOUNTER — Telehealth: Payer: Self-pay | Admitting: Family Medicine

## 2021-05-29 ENCOUNTER — Ambulatory Visit (INDEPENDENT_AMBULATORY_CARE_PROVIDER_SITE_OTHER): Payer: Medicare Other | Admitting: Family Medicine

## 2021-05-29 VITALS — BP 119/67 | HR 88 | Ht 72.0 in | Wt 298.0 lb

## 2021-05-29 DIAGNOSIS — J984 Other disorders of lung: Secondary | ICD-10-CM | POA: Diagnosis not present

## 2021-05-29 DIAGNOSIS — J210 Acute bronchiolitis due to respiratory syncytial virus: Secondary | ICD-10-CM | POA: Diagnosis not present

## 2021-05-29 DIAGNOSIS — G4730 Sleep apnea, unspecified: Secondary | ICD-10-CM | POA: Diagnosis not present

## 2021-05-29 MED ORDER — ALBUTEROL SULFATE HFA 108 (90 BASE) MCG/ACT IN AERS: 2.0000 | INHALATION_SPRAY | Freq: Four times a day (QID) | RESPIRATORY_TRACT | 0 refills | Status: DC | PRN

## 2021-05-29 MED ORDER — PREDNISONE 20 MG PO TABS: 40.0000 mg | ORAL_TABLET | Freq: Every day | ORAL | 0 refills | Status: DC

## 2021-05-29 NOTE — Progress Notes (Signed)
Established Patient Office Visit  Subjective:  Patient ID: Mark [name]., male    DOB: Dec 16, 1960  Age: 60 y.o. MRN: [id number]  CC:  Chief Complaint  Patient presents with   Hospitalization Follow-up    HPI Mark [name]. presents for hospital follow-up.  He was admitted on 11/2 for RSV and discharged home the following day length of stay was 2 days.  He was discharged home with 60 mg prednisone daily for 5 days.  He says he does feel a little bit better today its the first day that he has felt better.  But he still wheezing and short of breath.  He is worse with activity.  He does have a known history of restrictive lung disease and was previously followed by Dr. Michela Pitcher.  He did complete his prednisone yesterday and is not currently on any antibiotic treatment.  No fevers since he has been home.  He also has obstructive sleep apnea and says he has been trying to use his CPAP more consistently.    Discharge Summaries - documented in this encounter Mark Ferrari, NP - 05/24/2021 12:31 PM EDT Formatting of this note is different from the original. [facility]  Discharge Summary  Admit date: 05/23/2021 Discharge date and time: 05/24/2021 Hospital LOS: 2 days  Active Hospital Problems  Diagnosis Date Noted POA   *RSV bronchitis 05/23/2021 Yes   BPH (benign prostatic hyperplasia) 12/08/2015 Yes   Body mass index (BMI) 40.0-44.9, adult (*) 20/25/4270 Not Applicable   Essential hypertension 07/29/2014 Yes   Sleep apnea with use of continuous positive airway pressure (CPAP) 02/11/2013 Yes   Multiple sclerosis (*) 02/11/2013 Yes   Venous (peripheral) insufficiency 08/18/2007 Yes   Resolved Hospital Problems  Diagnosis Date Noted Date Resolved POA   Acute respiratory failure with hypoxemia (*) 05/23/2021 05/24/2021 Yes   COPD (chronic obstructive pulmonary disease) (*) 03/30/2018 05/23/2021 Yes    Current Discharge Medication List   NEW medications  Details  predniSONE (DELTASONE) 20 mg tablet Take three tablets (60 mg dose) by mouth daily for 5 days. Start date: 05/24/2021, End date: 05/29/2021     Past Medical History:  Diagnosis Date   Anxiety    chronic pain treated with narcotics   Arthritis    Chronic pain    Constipation due to opioid therapy    Depression    Dysphonia    thoracic left sided"impingement" with heminumbness   Dyspnea    GI bleed    abt 15 years ago   Morbid obesity ([place])    MS (multiple sclerosis) ([place]) 02/11/2013   [name]   Pneumonia    Sleep apnea with use of continuous positive airway pressure (CPAP) 02/11/2013   07-13-12 AHi of 98.6 /hr titrated to 11 cm water ,  average user time 4 hours and 4 minutes. Residual AHI 1.20 December 2012 .   Umbilical hernia     Past Surgical History:  Procedure Laterality Date   ANTERIOR CERVICAL DECOMP/DISCECTOMY FUSION N/A 09/29/2019   Procedure: Anterior Cervical Decompression Fusion - Cervical Four-Cervical Five;  Surgeon: [name], MD;  Location: [facility];  Service: Neurosurgery;  Laterality: N/A;  Anterior Cervical Decompression Fusion - Cervical Four-Cervical Five    CARPAL TUNNEL RELEASE Right    [name] TUNNEL RELEASE Right 09/29/2019   Procedure: Carpal Tunnel Release;  Surgeon: [name], MD;  Location: [facility];  Service: Neurosurgery;  Laterality: Right;   COLONOSCOPY W/ POLYPECTOMY     ESOPHAGOGASTRODUODENOSCOPY     MULTIPLE TOOTH EXTRACTIONS     POSTERIOR LUMBAR FUSION  04/17/15   [name], also removed old hardware   SPINE SURGERY     total of six vertebras fused, three lumbar surgeries and one anterior neck fusion   TONSILLECTOMY      Family History  Problem Relation Age of Onset   Arrhythmia Mother    Cancer Other    Cancer Other    Sleep walking Son     Social History   Socioeconomic History   Marital status: Married    Spouse name: [name]   Number of children:  2   Years of education: 12+   Highest education level: Some college, no degree  Occupational History   Occupation: Equities trader shop    Comment: disablity  Tobacco Use   Smoking status: Never   Smokeless tobacco: Never  Vaping Use   Vaping Use: Never used  Substance and Sexual Activity   Alcohol use: Yes    Comment: occasionally   Drug use: No   Sexual activity: Not Currently  Other Topics Concern   Not on file  Social History Narrative       Patient is married [name]) and lives at home with his wife, daughter, son-in-law and grandchild.   Patient has two adult children.   Patient is right-handed.   Patient is disabled.   Patient has a college education.   Patient drinks a gallon of tea daily.      Social Determinants of Health   Financial Resource Strain: Not on file  Food Insecurity: Not on file  Transportation Needs: Not on file  Physical Activity: Not on file  Stress: Not on file  Social Connections: Not on file  Intimate Partner Violence: Not on file    Outpatient Medications Prior to Visit  Medication Sig Dispense Refill   amantadine (SYMMETREL) 100 MG capsule Take 100 mg by mouth 3 (three) times daily.      amphetamine-dextroamphetamine (ADDERALL XR) 30 MG 24 hr capsule Take 30 mg by mouth daily.      Cholecalciferol 25 MCG (1000 UT) capsule Take 25 Units by mouth daily.     cyclobenzaprine (FLEXERIL) 10 MG tablet Take 1 tablet (10 mg total) by mouth 3 (three) times daily as needed for muscle spasms. 30 tablet 0   DULoxetine (CYMBALTA) 60 MG capsule Take 60 mg by mouth 2 (two) times daily.      gabapentin (NEURONTIN) 800 MG tablet Take 800 mg by mouth 4 (four) times daily.  11   HYDROcodone-acetaminophen (NORCO) 10-325 MG tablet Take 2 tablets by mouth every 4 (four) hours as needed for severe pain. 40 tablet 0   ibuprofen (ADVIL,MOTRIN) 800 MG tablet Take 800 mg by mouth every 8 (eight) hours as needed for moderate pain.     losartan-hydrochlorothiazide  (HYZAAR) 50-12.5 MG tablet TAKE ONE TABLET BY MOUTH EVERY DAY 90 tablet 1   morphine (MS CONTIN) 30 MG 12 hr tablet Take 1 tablet by mouth every 12 (twelve) hours.     tamsulosin (FLOMAX) 0.4 MG CAPS capsule Take 0.4 mg by mouth daily.     Teriflunomide 14 MG TABS Take 14 mg by mouth at bedtime.      tiZANidine (ZANAFLEX) 4 MG tablet Take 4 mg by mouth every 6 (six) hours as needed for muscle spasms.      No facility-administered medications prior to visit.  No Known Allergies  ROS Review of Systems    Objective:    Physical Exam  BP 119/67   Pulse 88   Ht 6' (1.829 m)   Wt 298 lb (135.2 kg)   SpO2 96%   BMI 40.42 kg/m  Wt Readings from Last 3 Encounters:  05/29/21 298 lb (135.2 kg)  04/11/21 300 lb (136.1 kg)  02/20/21 300 lb (136.1 kg)     Health Maintenance Due  Topic Date Due   COVID-19 Vaccine (1) Never done   Pneumococcal Vaccine 67-74 Years old (1 - PCV) Never done   Zoster Vaccines- Shingrix (1 of 2) Never done   COLONOSCOPY (Pts 45-33yr Insurance coverage will need to be confirmed)  11/02/2018   INFLUENZA VACCINE  Never done    There are no preventive care reminders to display for this patient.  Lab Results  Component Value Date   TSH 0.33 (L) 03/09/2018   Lab Results  Component Value Date   WBC 7.7 02/21/2021   HGB 14.3 02/21/2021   HCT 42.6 02/21/2021   MCV 86.4 02/21/2021   PLT 249 02/21/2021   Lab Results  Component Value Date   NA 139 02/21/2021   K 4.1 02/21/2021   CO2 35 (H) 02/21/2021   GLUCOSE 92 02/21/2021   BUN 26 (H) 02/21/2021   CREATININE 0.74 02/21/2021   BILITOT 0.5 02/21/2021   ALKPHOS 92 08/20/2016   AST 13 02/21/2021   ALT 14 02/21/2021   PROT 6.4 02/21/2021   ALBUMIN 4.3 08/20/2016   CALCIUM 9.7 02/21/2021   ANIONGAP 7 09/27/2019   EGFR 104 02/21/2021   Lab Results  Component Value Date   CHOL 145 02/21/2021   Lab Results  Component Value Date   HDL 55 02/21/2021   Lab Results  Component Value Date    LDLCALC 77 02/21/2021   Lab Results  Component Value Date   TRIG 52 02/21/2021   Lab Results  Component Value Date   CHOLHDL 2.6 02/21/2021   Lab Results  Component Value Date   HGBA1C 5.1 12/21/2019      Assessment & Plan:   Problem List Items Addressed This Visit       Respiratory   Sleep apnea with use of continuous positive airway pressure (CPAP)    To need to use CPAP consistently.      Restrictive lung disease    We discussed getting an up-to-date pulmonary function test when he is feeling better maybe next month.  It looks like his last spirometry was in June 2021.  They had been following him yearly but he did not go this year.  Be happy to refer him back if he would like.  Or we can just see how things go over the next several months.      Other Visit Diagnoses     RSV (acute bronchiolitis due to respiratory syncytial virus)    -  Primary       RSV-viral.  We did discuss may be extending the prednisone some get a write for 40 mg for 5 days he was previously on 60 mg for 5 days.  If not and continuing to improve or if he suddenly develops new or worsening symptoms to please let uKoreaknow.  Is a little bit poor air movement at the bases bilaterally.  Wheezing is definitely coming from the upper airway.  Helping to clear the mucus could be helpful to resolve that.  Also sent over a new albuterol inhaler  the when he has he thinks is old.  He does have a prior history of restrictive lung disease.  Meds ordered this encounter  Medications   albuterol (VENTOLIN HFA) 108 (90 Base) MCG/ACT inhaler    Sig: Inhale 2 puffs into the lungs every 6 (six) hours as needed for wheezing or shortness of breath.    Dispense:  8 g    Refill:  0   predniSONE (DELTASONE) 20 MG tablet    Sig: Take 2 tablets (40 mg total) by mouth daily with breakfast.    Dispense:  10 tablet    Refill:  0     Follow-up: No follow-ups on file.   I spent 32 minutes on the day of the encounter to  include pre-visit record review, face-to-face time with the patient and post visit ordering of test.   Beatrice Lecher, MD

## 2021-05-29 NOTE — Telephone Encounter (Signed)
Please call Zenia Resides or his wife and let them know that I was able to find his last breathing test it was actually in June 2021 so its not as old as I thought he had it done last summer it mostly shows what is called restrictive disease which means that the bottom of the lungs do not open up as well this can be from having a little extra weight gain but it can also be from his MS.  It may be worth getting him back in with the pulmonologist at some point to retest but I do not think it is urgent.  But if you would like a referral back to just let us know I think he was seeing Dr. Silvano Rusk nurse practitioner last year Wilber Bihari.

## 2021-05-29 NOTE — Assessment & Plan Note (Addendum)
We discussed getting an up-to-date pulmonary function test when he is feeling better maybe next month.  It looks like his last spirometry was in June 2021.  They had been following him yearly but he did not go this year.  Be happy to refer him back if he would like.  Or we can just see how things go over the next several months.

## 2021-05-29 NOTE — Assessment & Plan Note (Signed)
To need to use CPAP consistently.

## 2021-05-30 NOTE — Telephone Encounter (Signed)
Pt's spouse informed.  Spouse expressed understanding and requested referral to pulmonology.  Referral placed.  Charyl Bigger, CMA

## 2021-06-12 ENCOUNTER — Telehealth: Payer: Self-pay | Admitting: *Deleted

## 2021-06-12 ENCOUNTER — Other Ambulatory Visit: Payer: Self-pay | Admitting: Family Medicine

## 2021-06-12 ENCOUNTER — Ambulatory Visit (INDEPENDENT_AMBULATORY_CARE_PROVIDER_SITE_OTHER): Payer: Medicare Other

## 2021-06-12 ENCOUNTER — Other Ambulatory Visit: Payer: Self-pay

## 2021-06-12 ENCOUNTER — Encounter: Payer: Self-pay | Admitting: Family Medicine

## 2021-06-12 ENCOUNTER — Ambulatory Visit: Payer: Medicare Other | Admitting: Family Medicine

## 2021-06-12 VITALS — BP 102/55 | HR 72 | Ht 72.0 in | Wt 313.0 lb

## 2021-06-12 DIAGNOSIS — R059 Cough, unspecified: Secondary | ICD-10-CM | POA: Diagnosis not present

## 2021-06-12 DIAGNOSIS — R0602 Shortness of breath: Secondary | ICD-10-CM

## 2021-06-12 DIAGNOSIS — J3089 Other allergic rhinitis: Secondary | ICD-10-CM | POA: Diagnosis not present

## 2021-06-12 DIAGNOSIS — R350 Frequency of micturition: Secondary | ICD-10-CM | POA: Diagnosis not present

## 2021-06-12 DIAGNOSIS — E8770 Fluid overload, unspecified: Secondary | ICD-10-CM | POA: Diagnosis not present

## 2021-06-12 MED ORDER — FUROSEMIDE 20 MG PO TABS
20.0000 mg | ORAL_TABLET | Freq: Every day | ORAL | 3 refills | Status: DC | PRN
Start: 2021-06-12 — End: 2022-07-24

## 2021-06-12 MED ORDER — FUROSEMIDE 10 MG/ML IJ SOLN
60.0000 mg | Freq: Once | INTRAMUSCULAR | Status: AC
Start: 2021-06-12 — End: 2021-06-12
  Administered 2021-06-12: 60 mg via INTRAMUSCULAR

## 2021-06-12 MED ORDER — AMOXICILLIN-POT CLAVULANATE 875-125 MG PO TABS: 1.0000 | ORAL_TABLET | Freq: Two times a day (BID) | ORAL | 0 refills | Status: DC

## 2021-06-12 NOTE — Telephone Encounter (Signed)
Needs appt

## 2021-06-12 NOTE — Telephone Encounter (Signed)
Pt's wife called and stated that his O2 was fluctuating between 89-92 at bedtime. She stated that he reports that he feels ok but he's been wheezing and it feels that it's more in his upper chest and throat. She gave him some mucinex and he stated that he felt better after taking this. Denies any f/s/c. Asked if he has been using his inhaler and he is. And his CPAP  I asked what his O2 was during the day and today it is at 93%   Would like to know if he should come in to be seen or if something can be sent in for him to take? Will fwd to pcp for recommendations

## 2021-06-12 NOTE — Progress Notes (Signed)
Acute Office Visit  Subjective:    Patient ID: Mark [name]., male    DOB: Nov 27, 1960, 60 y.o.   MRN: [id number]  Chief Complaint  Patient presents with   Wheezing    HPI Patient is in today for shortness of breath.  His wife actually called this morning saying his oxygen was fluctuating between 89-92 last night before bedtime.  He had been noticing some increase shortness of breath and wheezing in his upper chest and throat area he took Mucinex and says it felt a little bit better.  No fevers chills or sweats.  He has been using his inhaler some but has not used it today.  Has been using his CPAP.  Actually just saw him for hospital follow-up 2 weeks ago after having been admitted to the hospital for RSV after 2-day stay.  He reports wheezing.  He actually had difficulty walking in today from the parking lot.  His wife also got a new cat and he thinks he may be allergic he would like to be tested if at all possible.  Past Medical History:  Diagnosis Date   Anxiety    chronic pain treated with narcotics   Arthritis    Chronic pain    Constipation due to opioid therapy    Depression    Dysphonia    thoracic left sided"impingement" with heminumbness   Dyspnea    GI bleed    abt 15 years ago   Morbid obesity ([place])    MS (multiple sclerosis) ([place]) 02/11/2013   [name]   Pneumonia    Sleep apnea with use of continuous positive airway pressure (CPAP) 02/11/2013   07-13-12 AHi of 98.6 /hr titrated to 11 cm water ,  average user time 4 hours and 4 minutes. Residual AHI 1.20 December 2012 .   Umbilical hernia     Past Surgical History:  Procedure Laterality Date   ANTERIOR CERVICAL DECOMP/DISCECTOMY FUSION N/A 09/29/2019   Procedure: Anterior Cervical Decompression Fusion - Cervical Four-Cervical Five;  Surgeon: [name], MD;  Location: [place];  Service: Neurosurgery;  Laterality: N/A;  Anterior Cervical Decompression Fusion - Cervical Four-Cervical Five    CARPAL  TUNNEL RELEASE Right    [name] TUNNEL RELEASE Right 09/29/2019   Procedure: Carpal Tunnel Release;  Surgeon: [name], MD;  Location: [place];  Service: Neurosurgery;  Laterality: Right;   COLONOSCOPY W/ POLYPECTOMY     ESOPHAGOGASTRODUODENOSCOPY     MULTIPLE TOOTH EXTRACTIONS     POSTERIOR LUMBAR FUSION  04/17/15   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   Patient has two adult children.   Patient is right-handed.   Patient is disabled.   Patient has a college education.   Patient drinks a gallon of tea daily.      Social Determinants of Health   Financial Resource Strain: Not on file  Food Insecurity: Not on file  Transportation Needs: Not on file  Physical Activity: Not on file  Stress: Not on file  Social Connections: Not on file  Intimate Partner Violence: Not on file    Outpatient Medications Prior to Visit  Medication Sig Dispense Refill   albuterol (VENTOLIN HFA) 108 (90 Base) MCG/ACT inhaler Inhale 2 puffs into the lungs every 6 (six) hours as needed  for wheezing or shortness of breath. 8 g 0   amantadine (SYMMETREL) 100 MG capsule Take 100 mg by mouth 3 (three) times daily.      amphetamine-dextroamphetamine (ADDERALL XR) 30 MG 24 hr capsule Take 30 mg by mouth daily.      Cholecalciferol 25 MCG (1000 UT) capsule Take 25 Units by mouth daily.     cyclobenzaprine (FLEXERIL) 10 MG tablet Take 1 tablet (10 mg total) by mouth 3 (three) times daily as needed for muscle spasms. 30 tablet 0   DULoxetine (CYMBALTA) 60 MG capsule Take 60 mg by mouth 2 (two) times daily.      gabapentin (NEURONTIN) 800 MG tablet Take 800 mg by mouth 4 (four) times daily.  11   HYDROcodone-acetaminophen (NORCO) 10-325 MG tablet Take 2 tablets by mouth every 4 (four) hours as needed for severe pain. 40 tablet 0   ibuprofen (ADVIL,MOTRIN) 800 MG tablet Take 800 mg by mouth every 8 (eight) hours as needed for moderate pain.     losartan-hydrochlorothiazide (HYZAAR) 50-12.5 MG tablet TAKE ONE TABLET BY MOUTH EVERY DAY 90 tablet 1   morphine (MS CONTIN) 30 MG 12 hr tablet Take 1 tablet by mouth every 12 (twelve) hours.     predniSONE (DELTASONE) 20 MG tablet Take 2 tablets (40 mg total) by mouth daily with breakfast. 10 tablet 0   tamsulosin (FLOMAX) 0.4 MG CAPS capsule Take 0.4 mg by mouth daily.     Teriflunomide 14 MG TABS Take 14 mg by mouth at bedtime.      tiZANidine (ZANAFLEX) 4 MG tablet Take 4 mg by mouth every 6 (six) hours as needed for muscle spasms.      No facility-administered medications prior to visit.    No Known Allergies  Review of Systems     Objective:    Physical Exam Constitutional:      Appearance: He is well-developed.  HENT:     Head: Normocephalic and atraumatic.     Right Ear: External ear normal.     Left Ear: External ear normal.     Nose: Nose normal.  Eyes:     Conjunctiva/sclera: Conjunctivae normal.     Pupils: Pupils are equal, round, and reactive to light.  Neck:     Thyroid: No thyromegaly.  Cardiovascular:     Rate  and Rhythm: Normal rate.     Heart sounds: Normal heart sounds.  Pulmonary:     Effort: Pulmonary effort is normal.     Breath sounds: Normal breath sounds.  Comments: Crackles at the base on the right Musculoskeletal:     Cervical back: Neck supple.  Lymphadenopathy:     Cervical: No cervical adenopathy.  Skin:    General: Skin is warm and dry.  Neurological:     Mental Status: He is alert and oriented to person, place, and time.    BP (!) 102/55   Pulse 72   Ht 6' (1.829 m)   Wt (!) 313 lb (142 kg)   SpO2 94%   BMI 42.45 kg/m  Wt Readings from Last 3 Encounters:  06/12/21 (!) 313 lb (142 kg)  05/29/21 298 lb (135.2 kg)  04/11/21 300 lb (136.1 kg)    Health Maintenance Due  Topic Date Due   COVID-19 Vaccine (1) Never done   Pneumococcal Vaccine 68-2 Years old (1 - PCV) Never done   Zoster Vaccines- Shingrix (1 of 2) Never done   COLONOSCOPY (Pts 45-52yr Insurance coverage will need to be confirmed)  11/02/2018   INFLUENZA VACCINE  Never done    There are no preventive care reminders to display for this patient.   Lab Results  Component Value Date   TSH 0.33 (L) 03/09/2018   Lab Results  Component Value Date   WBC 7.7 02/21/2021   HGB 14.3 02/21/2021   HCT 42.6 02/21/2021   MCV 86.4 02/21/2021   PLT 249 02/21/2021   Lab Results  Component Value Date   NA 139 02/21/2021   K 4.1 02/21/2021   CO2 35 (H) 02/21/2021   GLUCOSE 92 02/21/2021   BUN 26 (H) 02/21/2021   CREATININE 0.74 02/21/2021   BILITOT 0.5 02/21/2021   ALKPHOS 92 08/20/2016   AST 13 02/21/2021   ALT 14 02/21/2021   PROT 6.4 02/21/2021   ALBUMIN 4.3 08/20/2016   CALCIUM 9.7 02/21/2021   ANIONGAP 7 09/27/2019   EGFR 104 02/21/2021   Lab Results  Component Value Date   CHOL 145 02/21/2021   Lab Results  Component Value Date   HDL 55 02/21/2021   Lab Results  Component Value Date   LDLCALC 77 02/21/2021   Lab Results  Component Value Date   TRIG 52 02/21/2021   Lab  Results  Component Value Date   CHOLHDL 2.6 02/21/2021   Lab Results  Component Value Date   HGBA1C 5.1 12/21/2019       Assessment & Plan:   Problem List Items Addressed This Visit   None Visit Diagnoses     SOB (shortness of breath)    -  Primary   Relevant Medications   furosemide (LASIX) injection 60 mg (Completed)   Other Relevant Orders   DG Chest 2 View (Completed)   CBC with Differential/Platelet   COMPLETE METABOLIC PANEL WITH GFR   B Nat Peptide   Allergen, Cat Dander, e1   Allergen, Dog Dander, e5   Non-seasonal allergic rhinitis due to other allergic trigger       Relevant Orders   Allergen, Cat Dander, e1   Allergen, Dog Dander, e5   Hypervolemia, unspecified hypervolemia type          Shortness of breath-he is up 15 pounds from when I saw him 2 weeks ago.  He has pitting edema on both lower extremities which is concerning for volume overload.  Will get chest x-ray today as well as do labs and check a BNP, renal function and liver function.  I will see him back after he comes back for his chest x-ray.  Also  the wheezing is mostly in his upper chest area.  Even and IM injection of Lasix 60 mg here today.  He actually started urinating before he left.  We will call with results of the over read on the chest x-ray.  I do see some increased congestion in the chest.  No layering of fluid.  He has a very elevated right diaphragm but this is not new compared to old film from last year.  He does not have a home scale to weigh himself daily so I did encourage him to keep an eye on the swelling in his ankles and his breathing and to let us know tomorrow if he is feeling better or not.  Meds ordered this encounter  Medications   furosemide (LASIX) 20 MG tablet    Sig: Take 1 tablet (20 mg total) by mouth daily as needed.    Dispense:  20 tablet    Refill:  3   furosemide (LASIX) injection 60 mg      Beatrice Lecher, MD

## 2021-06-12 NOTE — Progress Notes (Signed)
HI Mark [name], your chest xray shows possible infiltrate or atelectasis on the right.  I am sending over an antibiotic foryu.

## 2021-06-12 NOTE — Telephone Encounter (Signed)
Patient scheduled.

## 2021-06-13 LAB — COMPLETE METABOLIC PANEL WITH GFR
AG Ratio: 1.9 (calc) (ref 1.0–2.5)
ALT: 19 U/L (ref 9–46)
AST: 16 U/L (ref 10–35)
Albumin: 4.2 g/dL (ref 3.6–5.1)
Alkaline phosphatase (APISO): 100 U/L (ref 35–144)
BUN: 16 mg/dL (ref 7–25)
CO2: 33 mmol/L — ABNORMAL HIGH (ref 20–32)
Calcium: 9.6 mg/dL (ref 8.6–10.3)
Chloride: 98 mmol/L (ref 98–110)
Creat: 0.82 mg/dL (ref 0.70–1.30)
Globulin: 2.2 g/dL (calc) (ref 1.9–3.7)
Glucose, Bld: 105 mg/dL — ABNORMAL HIGH (ref 65–99)
Potassium: 4.2 mmol/L (ref 3.5–5.3)
Sodium: 138 mmol/L (ref 135–146)
Total Bilirubin: 0.5 mg/dL (ref 0.2–1.2)
Total Protein: 6.4 g/dL (ref 6.1–8.1)
eGFR: 101 mL/min/{1.73_m2} (ref >=60)

## 2021-06-13 LAB — CBC WITH DIFFERENTIAL/PLATELET
Absolute Monocytes: 442 cells/uL (ref 200–950)
Basophils Absolute: 47 cells/uL (ref 0–200)
Basophils Relative: 0.6 %
Eosinophils Absolute: 253 cells/uL (ref 15–500)
Eosinophils Relative: 3.2 %
HCT: 39.8 % (ref 38.5–50.0)
Hemoglobin: 13.4 g/dL (ref 13.2–17.1)
Lymphs Abs: 1675 cells/uL (ref 850–3900)
MCH: 29.1 pg (ref 27.0–33.0)
MCHC: 33.7 g/dL (ref 32.0–36.0)
MCV: 86.3 fL (ref 80.0–100.0)
MPV: 9.9 fL (ref 7.5–12.5)
Monocytes Relative: 5.6 %
Neutro Abs: 5483 cells/uL (ref 1500–7800)
Neutrophils Relative %: 69.4 %
Platelets: 248 10*3/uL (ref 140–400)
RBC: 4.61 10*6/uL (ref 4.20–5.80)
RDW: 12.5 % (ref 11.0–15.0)
Total Lymphocyte: 21.2 %
WBC: 7.9 10*3/uL (ref 3.8–10.8)

## 2021-06-13 LAB — BRAIN NATRIURETIC PEPTIDE: Brain Natriuretic Peptide: 17 pg/mL (ref <100)

## 2021-06-13 LAB — ALLERGEN, DOG DANDER, E5
Class: 0
Dog Dander: <0.1 kU/L

## 2021-06-13 LAB — URINALYSIS, ROUTINE W REFLEX MICROSCOPIC
Bilirubin Urine: NEGATIVE
Glucose, UA: NEGATIVE
Hgb urine dipstick: NEGATIVE
Ketones, ur: NEGATIVE
Leukocytes,Ua: NEGATIVE
Nitrite: NEGATIVE
Protein, ur: NEGATIVE
Specific Gravity, Urine: 1.014 (ref 1.001–1.035)
pH: 5.5 (ref 5.0–8.0)

## 2021-06-13 LAB — INTERPRETATION:

## 2021-06-13 LAB — ALLERGEN, CAT DANDER, E1
Cat Dander: <0.1 kU/L
Class: 0

## 2021-06-13 NOTE — Progress Notes (Signed)
Pls call pt:  his blood count looks good.  No sign of anemia or infection.  Kidney function is stable.  Unfortunately he cannot get rid of the kitten.  He has had he is not allergic to cats or dogs.  Also please let him know that the BNP test is still pending.

## 2021-06-18 NOTE — Progress Notes (Signed)
Hi Mark [name], the BNP which is a test for heart failure actually looks good.  Please let us know if you are feeling better or if you are not feeling better.

## 2021-06-23 DIAGNOSIS — G4733 Obstructive sleep apnea (adult) (pediatric): Secondary | ICD-10-CM | POA: Diagnosis not present

## 2021-06-25 ENCOUNTER — Ambulatory Visit: Payer: Medicare Other | Admitting: Medical-Surgical

## 2021-06-26 ENCOUNTER — Encounter: Payer: Self-pay | Admitting: Family Medicine

## 2021-06-26 ENCOUNTER — Other Ambulatory Visit: Payer: Self-pay

## 2021-06-26 ENCOUNTER — Ambulatory Visit (INDEPENDENT_AMBULATORY_CARE_PROVIDER_SITE_OTHER): Payer: Medicare Other | Admitting: Family Medicine

## 2021-06-26 VITALS — BP 127/76 | HR 72 | Temp 98.0°F | Ht 72.0 in | Wt 311.0 lb

## 2021-06-26 DIAGNOSIS — R051 Acute cough: Secondary | ICD-10-CM

## 2021-06-26 LAB — BASIC METABOLIC PANEL WITH GFR
BUN: 15 mg/dL (ref 7–25)
CO2: 36 mmol/L — ABNORMAL HIGH (ref 20–32)
Calcium: 9.5 mg/dL (ref 8.6–10.3)
Chloride: 98 mmol/L (ref 98–110)
Creat: 0.82 mg/dL (ref 0.70–1.30)
Glucose, Bld: 103 mg/dL — ABNORMAL HIGH (ref 65–99)
Potassium: 3.9 mmol/L (ref 3.5–5.3)
Sodium: 139 mmol/L (ref 135–146)
eGFR: 101 mL/min/{1.73_m2} (ref >=60)

## 2021-06-26 MED ORDER — BENZONATATE 200 MG PO CAPS
200.0000 mg | ORAL_CAPSULE | Freq: Two times a day (BID) | ORAL | 0 refills | Status: DC | PRN
Start: 2021-06-26 — End: 2021-07-03

## 2021-06-26 MED ORDER — AZITHROMYCIN 250 MG PO TABS: ORAL_TABLET | ORAL | 0 refills | Status: DC

## 2021-06-26 NOTE — Patient Instructions (Addendum)
Adding a Zpak for more bacterial coverage Would like you to take the lasix (furosemide) daily for the next week, then cut back to as needed for swelling and weight gain. I am rechecking labs for you today and if we need to change something we will let you know.  Wear compression socks and elevate legs whenever you are sitting. Minimize sodium intake. Use incentive spirometer several times a day.  Adding benzonatate capsules for as needed use for cough. It is important for you to continue cough and deep breathing throughout the day to keep your lungs clear.  Weigh yourself daily at the same time and keep a log.   Follow-up if not improving in 1 week.

## 2021-06-26 NOTE — Progress Notes (Signed)
Acute Office Visit  Subjective:    Patient ID: Mark [name]., male    DOB: 30-Jul-1960, 60 y.o.   MRN: [id number]  Chief Complaint  Patient presents with   Cough    HPI Patient is in today for lingering cough.  On 06/12/2021 patient saw PCP for shortness of breath and O2 sats 89 to 92%.  He was reporting some wheezing in his upper chest and throat that felt better after taking some Mucinex.  2 weeks prior he had been admitted to the hospital for a 2-day stay for RSV.  At PCP visit he was up 15 pounds from 2 weeks prior and had pitting edema to bilateral lower extremities.  He was given IM lasix in the office. Chest x-ray showed possible infiltrate vs atelectasis on right and he was started on Augmentin. BNP was normal. CBC and CMP stable.   Today he reports that he feels about the same. States he doesn't feel terrible, but definitely isn't back to baseline. Reports he finished the Augmentin and has been doing the lasix occasionally. He continues to have a dry/hacking cough with occasional clear sputum. O2 sats ar home are staying around 93%. He is afraid of this developing into a bad pneumonia as he has had that happen in the past. He has not had any fevers or chest pain. Reports he continues to have exertional dyspnea and has been using his albuterol multiple times per day, but hasn't noticed much improvement. States he just feels like there is something in his chest and throat that makes him keep coughing. He reports his legs are still swollen and he has not been wearing compression.     Past Medical History:  Diagnosis Date   Anxiety    chronic pain treated with narcotics   Arthritis    Chronic pain    Constipation due to opioid therapy    Depression    Dysphonia    thoracic left sided"impingement" with heminumbness   Dyspnea    GI bleed    abt 15 years ago   Morbid obesity ([place])    MS (multiple sclerosis) ([place]) 02/11/2013   [name]   Pneumonia    Sleep  apnea with use of continuous positive airway pressure (CPAP) 02/11/2013   07-13-12 AHi of 98.6 /hr titrated to 11 cm water ,  average user time 4 hours and 4 minutes. Residual AHI 1.20 December 2012 .   Umbilical hernia     Past Surgical History:  Procedure Laterality Date   ANTERIOR CERVICAL DECOMP/DISCECTOMY FUSION N/A 09/29/2019   Procedure: Anterior Cervical Decompression Fusion - Cervical Four-Cervical Five;  Surgeon: [name], MD;  Location: [facility];  Service: Neurosurgery;  Laterality: N/A;  Anterior Cervical Decompression Fusion - Cervical Four-Cervical Five    CARPAL TUNNEL RELEASE Right    [name] TUNNEL RELEASE Right 09/29/2019   Procedure: Carpal Tunnel Release;  Surgeon: [name], MD;  Location: [facility];  Service: Neurosurgery;  Laterality: Right;   COLONOSCOPY W/ POLYPECTOMY     ESOPHAGOGASTRODUODENOSCOPY     MULTIPLE TOOTH EXTRACTIONS     POSTERIOR LUMBAR FUSION  04/17/15   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   Patient has two adult children.   Patient is  right-handed.   Patient is disabled.   Patient has a college education.   Patient drinks a gallon of tea daily.      Social Determinants of Health   Financial Resource Strain: Not on file  Food Insecurity: Not on file  Transportation Needs: Not on file  Physical Activity: Not on file  Stress: Not on file  Social Connections: Not on file  Intimate Partner Violence: Not on file    Outpatient Medications Prior to Visit  Medication Sig Dispense Refill   albuterol (VENTOLIN HFA) 108 (90 Base) MCG/ACT inhaler Inhale 2 puffs into the lungs every 6 (six) hours as needed for wheezing or shortness of breath. 8 g 0   amantadine (SYMMETREL) 100 MG capsule Take 100 mg by mouth 3 (three) times daily.      amphetamine-dextroamphetamine (ADDERALL XR) 30 MG 24 hr capsule Take 30 mg by mouth daily.      Cholecalciferol 25 MCG (1000 UT) capsule Take 25 Units by mouth daily.     cyclobenzaprine (FLEXERIL) 10 MG tablet Take 1 tablet (10 mg total) by mouth 3 (three) times daily as needed for muscle spasms. 30 tablet 0   DULoxetine (CYMBALTA) 60 MG capsule Take 60 mg by mouth 2 (two) times daily.      furosemide (LASIX) 20 MG tablet Take 1 tablet (20 mg total) by mouth daily as needed. 20 tablet 3   gabapentin (NEURONTIN) 800 MG tablet Take 800 mg by mouth 4 (four) times daily.  11   HYDROcodone-acetaminophen (NORCO) 10-325 MG tablet Take 2 tablets by mouth every 4 (four) hours as needed for severe pain. 40 tablet 0   ibuprofen (ADVIL,MOTRIN) 800 MG tablet Take 800 mg by mouth every 8 (eight) hours as needed for moderate pain.     losartan-hydrochlorothiazide (HYZAAR) 50-12.5 MG tablet TAKE ONE TABLET BY MOUTH EVERY DAY 90 tablet 1   morphine (MS CONTIN) 30 MG 12 hr tablet Take 1 tablet by mouth every 12 (twelve) hours.     predniSONE (DELTASONE) 20 MG tablet Take 2 tablets (40 mg total) by mouth daily with breakfast. 10 tablet 0   tamsulosin (FLOMAX) 0.4 MG CAPS capsule Take 0.4 mg by mouth daily.      Teriflunomide 14 MG TABS Take 14 mg by mouth at bedtime.      tiZANidine (ZANAFLEX) 4 MG tablet Take 4 mg by mouth every 6 (six) hours as needed for muscle spasms.      amoxicillin-clavulanate (AUGMENTIN) 875-125 MG tablet Take 1 tablet by mouth 2 (two) times daily. (Patient not taking: Reported on  06/26/2021) 20 tablet 0   No facility-administered medications prior to visit.    No Known Allergies  Review of Systems All review of systems negative except what is listed in the HPI     Objective:    Physical Exam Vitals reviewed.  Constitutional:      Appearance: Normal appearance. He is obese.  HENT:     Head: Normocephalic and atraumatic.  Cardiovascular:     Rate and Rhythm: Regular rhythm.  Pulmonary:     Effort: Pulmonary effort is normal.     Breath sounds: No wheezing or rales.     Comments: Right base diminished Musculoskeletal:     Cervical back: Normal range of motion and neck supple. No tenderness.     Right lower leg: Edema present.     Left lower leg: Edema present.     Comments:  +2 bilateral lower extremity edema  Lymphadenopathy:     Cervical: No cervical adenopathy.  Skin:    General: Skin is warm and dry.  Neurological:     General: No focal deficit present.     Mental Status: He is alert and oriented to person, place, and time. Mental status is at baseline.  Psychiatric:        Mood and Affect: Mood normal.        Behavior: Behavior normal.        Thought Content: Thought content normal.        Judgment: Judgment normal.    BP 127/76 (BP Location: Left Arm, Patient Position: Sitting, Cuff Size: Large)   Pulse 72   Temp 98 F (36.7 C) (Oral)   Ht 6' (1.829 m)   Wt (!) 311 lb (141.1 kg)   SpO2 93%   BMI 42.18 kg/m  Wt Readings from Last 3 Encounters:  06/26/21 (!) 311 lb (141.1 kg)  06/12/21 (!) 313 lb (142 kg)  05/29/21 298 lb (135.2 kg)    Health Maintenance Due  Topic Date Due   COLONOSCOPY (Pts 45-57yr Insurance coverage will need to be  confirmed)  11/02/2018    There are no preventive care reminders to display for this patient.   Lab Results  Component Value Date   TSH 0.33 (L) 03/09/2018   Lab Results  Component Value Date   WBC 7.9 06/12/2021   HGB 13.4 06/12/2021   HCT 39.8 06/12/2021   MCV 86.3 06/12/2021   PLT 248 06/12/2021   Lab Results  Component Value Date   NA 138 06/12/2021   K 4.2 06/12/2021   CO2 33 (H) 06/12/2021   GLUCOSE 105 (H) 06/12/2021   BUN 16 06/12/2021   CREATININE 0.82 06/12/2021   BILITOT 0.5 06/12/2021   ALKPHOS 92 08/20/2016   AST 16 06/12/2021   ALT 19 06/12/2021   PROT 6.4 06/12/2021   ALBUMIN 4.3 08/20/2016   CALCIUM 9.6 06/12/2021   ANIONGAP 7 09/27/2019   EGFR 101 06/12/2021   Lab Results  Component Value Date   CHOL 145 02/21/2021   Lab Results  Component Value Date   HDL 55 02/21/2021   Lab Results  Component Value Date   LDLCALC 77 02/21/2021   Lab Results  Component Value Date   TRIG 52 02/21/2021   Lab Results  Component Value Date   CHOLHDL 2.6 02/21/2021   Lab Results  Component Value Date   HGBA1C 5.1 12/21/2019       Assessment & Plan:     1. Acute cough Previous xray showed possible infiltrate  vs. Atelectasis at R base. Will go ahead and add Zpak for PNA coverage given persistent symptoms and less than ideal O2 sats. Recommend he take the lasix daily for the next week (repeat BMP today and will let him know if we need to change the plan). Encouraged him to use compression socks, elevate legs, weigh daily, and minimize sodium intake. Gave him an incentive spirometer to use throughout the day. Adding benzonatate capsules for as needed coughing - educated on pulmonary hygiene and significance of cough and deep breathing. Patient aware of signs/symptoms requiring further/urgent evaluation.   - BASIC METABOLIC PANEL WITH GFR - azithromycin (ZITHROMAX Z-PAK) 250 MG tablet; Take 2 tablets (500 mg) on  Day 1,  followed by 1 tablet (250 mg) once  daily on Days 2 through 5.  Dispense: 6 tablet; Refill: 0 - benzonatate (TESSALON) 200 MG capsule; Take 1 capsule (200 mg total) by mouth 2 (two) times daily as needed for cough.  Dispense: 20 capsule; Refill: 0    Follow-up in 1 week if not improving.   Purcell Nails Olevia Bowens, DNP, FNP-C

## 2021-06-28 DIAGNOSIS — M25561 Pain in right knee: Secondary | ICD-10-CM | POA: Diagnosis not present

## 2021-06-28 DIAGNOSIS — M6740 Ganglion, unspecified site: Secondary | ICD-10-CM | POA: Diagnosis not present

## 2021-06-28 DIAGNOSIS — M1712 Unilateral primary osteoarthritis, left knee: Secondary | ICD-10-CM | POA: Diagnosis not present

## 2021-06-28 DIAGNOSIS — M25562 Pain in left knee: Secondary | ICD-10-CM | POA: Diagnosis not present

## 2021-06-28 NOTE — Progress Notes (Signed)
The reflex microscopic on your urine was normal which is good.  Remind him he is overdue for his 5-year recall for colonoscopy with digestive health.  Please encourage him to schedule.

## 2021-07-02 DIAGNOSIS — M25562 Pain in left knee: Secondary | ICD-10-CM | POA: Diagnosis not present

## 2021-07-02 DIAGNOSIS — M1712 Unilateral primary osteoarthritis, left knee: Secondary | ICD-10-CM | POA: Diagnosis not present

## 2021-07-02 DIAGNOSIS — J205 Acute bronchitis due to respiratory syncytial virus: Secondary | ICD-10-CM | POA: Diagnosis not present

## 2021-07-02 DIAGNOSIS — M6740 Ganglion, unspecified site: Secondary | ICD-10-CM | POA: Diagnosis not present

## 2021-07-02 DIAGNOSIS — R2242 Localized swelling, mass and lump, left lower limb: Secondary | ICD-10-CM | POA: Diagnosis not present

## 2021-07-03 ENCOUNTER — Other Ambulatory Visit: Payer: Self-pay

## 2021-07-03 ENCOUNTER — Ambulatory Visit: Payer: Medicare Other | Admitting: Family Medicine

## 2021-07-03 ENCOUNTER — Encounter: Payer: Self-pay | Admitting: Family Medicine

## 2021-07-03 ENCOUNTER — Ambulatory Visit (INDEPENDENT_AMBULATORY_CARE_PROVIDER_SITE_OTHER): Payer: Medicare Other | Admitting: Family Medicine

## 2021-07-03 VITALS — BP 122/82 | HR 74 | Temp 98.2°F | Resp 18 | Ht 72.0 in | Wt 304.0 lb

## 2021-07-03 DIAGNOSIS — R062 Wheezing: Secondary | ICD-10-CM

## 2021-07-03 DIAGNOSIS — R053 Chronic cough: Secondary | ICD-10-CM | POA: Diagnosis not present

## 2021-07-03 MED ORDER — PREDNISONE 20 MG PO TABS: 40.0000 mg | ORAL_TABLET | Freq: Every day | ORAL | 0 refills | Status: DC

## 2021-07-03 MED ORDER — ADVAIR HFA 115-21 MCG/ACT IN AERO: 2.0000 | INHALATION_SPRAY | Freq: Two times a day (BID) | RESPIRATORY_TRACT | 1 refills | Status: DC

## 2021-07-03 NOTE — Patient Instructions (Signed)
Stop Breo and use the Advair instead.

## 2021-07-03 NOTE — Progress Notes (Signed)
Established Patient Office Visit  Subjective:  Patient ID: Mark [name]., male    DOB: 30-Oct-1960  Age: 60 y.o. MRN: [id number]  CC:  Chief Complaint  Patient presents with   Follow up    1 week. Patient states he still has a slight cough and wheezing.    Swelling    Bilateral ankles, swelling in ankles has improved.     HPI Mark [name] [employer]. presents for cough.  He has had a complicated history including a hospitalization back in November for RSV.  He was seen by one of my partners on December 6.  Z-Pak was added for pneumonia coverage given his persistent symptoms and less than ideal oxygen levels at that time.  He still has a slight cough and some wheezing. He feels he is about 75% better overall.   No more fever or chills, etc.  Wheezing is worse at night.    He also reports that his ankle swelling is improved.  His weight is down about 7 pounds from when he was here a week ago.  Past Medical History:  Diagnosis Date   Anxiety    chronic pain treated with narcotics   Arthritis    Chronic pain    Constipation due to opioid therapy    Depression    Dysphonia    thoracic left sided"impingement" with heminumbness   Dyspnea    GI bleed    abt 15 years ago   Morbid obesity ([place])    MS (multiple sclerosis) ([place]) 02/11/2013   [name]   Pneumonia    Sleep apnea with use of continuous positive airway pressure (CPAP) 02/11/2013   07-13-12 AHi of 98.6 /hr titrated to 11 cm water ,  average user time 4 hours and 4 minutes. Residual AHI 1.20 December 2012 .   Umbilical hernia     Past Surgical History:  Procedure Laterality Date   ANTERIOR CERVICAL DECOMP/DISCECTOMY FUSION N/A 09/29/2019   Procedure: Anterior Cervical Decompression Fusion - Cervical Four-Cervical Five;  Surgeon: [name], MD;  Location: [facility];  Service: Neurosurgery;  Laterality: N/A;  Anterior Cervical Decompression Fusion - Cervical Four-Cervical Five    CARPAL TUNNEL RELEASE Right     [name] TUNNEL RELEASE Right 09/29/2019   Procedure: Carpal Tunnel Release;  Surgeon: [name], MD;  Location: [facility];  Service: Neurosurgery;  Laterality: Right;   COLONOSCOPY W/ POLYPECTOMY     ESOPHAGOGASTRODUODENOSCOPY     MULTIPLE TOOTH EXTRACTIONS     POSTERIOR LUMBAR FUSION  04/17/15   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   Patient has two adult children.   Patient is right-handed.   Patient is disabled.   Patient has a college education.   Patient drinks a gallon of tea daily.      Social Determinants of Health   Financial Resource Strain: Not on file  Food Insecurity: Not on file  Transportation Needs: Not on file  Physical Activity: Not on file  Stress: Not on file  Social Connections: Not on file  Intimate Partner Violence: Not on file    Outpatient Medications Prior to Visit  Medication Sig Dispense Refill   albuterol (VENTOLIN HFA) 108 (90 Base) MCG/ACT inhaler Inhale 2 puffs into the lungs every 6 (six) hours as needed for wheezing or shortness  of breath. 8 g 0   amantadine (SYMMETREL) 100 MG capsule Take 100 mg by mouth 3 (three) times daily.      amoxicillin-clavulanate (AUGMENTIN) 875-125 MG tablet Take 1 tablet by mouth 2 (two) times daily. 20 tablet 0   amphetamine-dextroamphetamine (ADDERALL XR) 30 MG 24 hr capsule Take 30 mg by mouth daily.      benzonatate (TESSALON) 200 MG capsule Take 1 capsule (200 mg total) by mouth 2 (two) times daily as needed for cough. 20 capsule 0   Cholecalciferol 25 MCG (1000 UT) capsule Take 25 Units by mouth daily.     cyclobenzaprine (FLEXERIL) 10 MG tablet Take 1 tablet (10 mg total) by mouth 3 (three) times daily as needed for muscle spasms. 30 tablet 0   DULoxetine (CYMBALTA) 60 MG capsule Take 60 mg by mouth 2 (two) times daily.      fluticasone furoate-vilanterol (BREO ELLIPTA) 100-25 MCG/ACT AEPB Inhale into the lungs.     furosemide (LASIX) 20 MG tablet Take 1 tablet (20 mg total) by mouth daily as needed. 20 tablet 3   gabapentin (NEURONTIN) 800 MG tablet Take 800 mg by mouth 4 (four) times daily.  11   gabapentin (NEURONTIN) 800 MG tablet Take by mouth.     HYDROcodone-acetaminophen (NORCO) 10-325 MG tablet Take 2 tablets by mouth every 4 (four) hours as needed for severe pain. 40 tablet 0   ibuprofen (ADVIL,MOTRIN) 800 MG tablet Take 800 mg by mouth every 8 (eight) hours as needed for moderate pain.     losartan-hydrochlorothiazide (HYZAAR) 50-12.5 MG tablet TAKE ONE TABLET BY MOUTH EVERY DAY 90 tablet 1   morphine (MS CONTIN) 30 MG 12 hr tablet Take 1 tablet by mouth every 12 (twelve) hours.     tamsulosin (FLOMAX) 0.4 MG CAPS capsule Take 0.4 mg by mouth daily.     Teriflunomide 14 MG TABS Take 14 mg by mouth at bedtime.      tiZANidine (ZANAFLEX) 4 MG tablet Take 4 mg by mouth every 6 (six) hours as needed for muscle spasms.      predniSONE (DELTASONE) 20 MG tablet Take 2 tablets (40 mg total) by mouth daily with breakfast. 10 tablet 0   azithromycin (ZITHROMAX Z-PAK) 250 MG tablet  Take 2 tablets (500 mg) on  Day 1,  followed by 1 tablet (250 mg) once daily on Days 2 through 5. 6 tablet 0   No facility-administered medications prior to visit.    No Known Allergies  ROS Review of  Systems    Objective:    Physical Exam  BP 122/82    Pulse 74    Temp 98.2 F (36.8 C)    Resp 18    Ht 6' (1.829 m)    Wt (!) 304 lb (137.9 kg)    SpO2 95%    BMI 41.23 kg/m  Wt Readings from Last 3 Encounters:  07/03/21 (!) 304 lb (137.9 kg)  06/26/21 (!) 311 lb (141.1 kg)  06/12/21 (!) 313 lb (142 kg)     Health Maintenance Due  Topic Date Due   COLONOSCOPY (Pts 45-42yr Insurance coverage will need to be confirmed)  11/02/2018    There are no preventive care reminders to display for this patient.  Lab Results  Component Value Date   TSH 0.33 (L) 03/09/2018   Lab Results  Component Value Date   WBC 7.9 06/12/2021   HGB 13.4 06/12/2021   HCT 39.8 06/12/2021   MCV 86.3 06/12/2021   PLT 248 06/12/2021   Lab Results  Component Value Date   NA 139 06/26/2021   K 3.9 06/26/2021   CO2 36 (H) 06/26/2021   GLUCOSE 103 (H) 06/26/2021   BUN 15 06/26/2021   CREATININE 0.82 06/26/2021   BILITOT 0.5 06/12/2021   ALKPHOS 92 08/20/2016   AST 16 06/12/2021   ALT 19 06/12/2021   PROT 6.4 06/12/2021   ALBUMIN 4.3 08/20/2016   CALCIUM 9.5 06/26/2021   ANIONGAP 7 09/27/2019   EGFR 101 06/26/2021   Lab Results  Component Value Date   CHOL 145 02/21/2021   Lab Results  Component Value Date   HDL 55 02/21/2021   Lab Results  Component Value Date   LDLCALC 77 02/21/2021   Lab Results  Component Value Date   TRIG 52 02/21/2021   Lab Results  Component Value Date   CHOLHDL 2.6 02/21/2021   Lab Results  Component Value Date   HGBA1C 5.1 12/21/2019      Assessment & Plan:   Problem List Items Addressed This Visit       Other   Chronic cough   Other Visit Diagnoses     Wheezing    -  Primary       Chronic cough with wheezing-we discussed doing  a round of prednisone.  And switching him to Symbicort or Advair.  Looks like Advair is better covered on his insurance plan so we will send that 1 over.  Call if not continuing to improve but I did explain to him that it may take several more weeks for him to feel like he is getting back to 100%.  Meds ordered this encounter  Medications   predniSONE (DELTASONE) 20 MG tablet    Sig: Take 2 tablets (40 mg total) by mouth daily with breakfast.    Dispense:  10 tablet    Refill:  0   fluticasone-salmeterol (ADVAIR HFA) 115-21 MCG/ACT inhaler    Sig: Inhale 2 puffs into the lungs 2 (two) times daily.    Dispense:  1 each    Refill:  1    Follow-up: No follow-ups on file.    CBeatrice Lecher MD

## 2021-07-04 DIAGNOSIS — Z5181 Encounter for therapeutic drug level monitoring: Secondary | ICD-10-CM | POA: Diagnosis not present

## 2021-07-04 DIAGNOSIS — G8381 Brown-Sequard syndrome: Secondary | ICD-10-CM | POA: Diagnosis not present

## 2021-07-04 DIAGNOSIS — G35 Multiple sclerosis: Secondary | ICD-10-CM | POA: Diagnosis not present

## 2021-07-04 DIAGNOSIS — Z79899 Other long term (current) drug therapy: Secondary | ICD-10-CM | POA: Diagnosis not present

## 2021-07-04 DIAGNOSIS — R252 Cramp and spasm: Secondary | ICD-10-CM | POA: Diagnosis not present

## 2021-07-11 ENCOUNTER — Telehealth: Payer: Self-pay | Admitting: *Deleted

## 2021-07-11 NOTE — Telephone Encounter (Signed)
Pt called and asked if Dr. Madilyn Fireman would send in a prescription for Magic Mouthwash. He stated that since he has been taking the steroids it has caused him to get thrush and he is hoping to get this resolved before it gets worse.   He is using Haematologist in [place].

## 2021-07-12 ENCOUNTER — Telehealth: Payer: Self-pay | Admitting: Family Medicine

## 2021-07-12 MED ORDER — MAGIC MOUTHWASH: ORAL | 0 refills | Status: DC

## 2021-07-12 NOTE — Telephone Encounter (Signed)
Patient called back and stated he has not heard anything regarding this message from yesterday and needs the Magic Mouthwash called in, especially before Christmas. Please Advise.

## 2021-07-12 NOTE — Telephone Encounter (Signed)
Left a message advising of medication.

## 2021-07-12 NOTE — Telephone Encounter (Signed)
Med just sent.

## 2021-07-12 NOTE — Telephone Encounter (Signed)
Meds ordered this encounter  Medications   magic mouthwash SOLN    Sig: Swish, gargle and spit 10 mL every 6 hours as needed.  Diphenhydramine 12.5 mg / 5 mL, viscous lidocaine 2%, Maalox: mix 1:1:1    Dispense:  200 mL    Refill:  0

## 2021-07-13 NOTE — Telephone Encounter (Signed)
Patient's wife aware prescription sent to pharmacy.

## 2021-07-19 ENCOUNTER — Ambulatory Visit: Payer: Self-pay

## 2021-07-19 ENCOUNTER — Ambulatory Visit (INDEPENDENT_AMBULATORY_CARE_PROVIDER_SITE_OTHER): Payer: Medicare Other | Admitting: Orthopaedic Surgery

## 2021-07-19 ENCOUNTER — Other Ambulatory Visit: Payer: Self-pay

## 2021-07-19 DIAGNOSIS — F1120 Opioid dependence, uncomplicated: Secondary | ICD-10-CM | POA: Diagnosis not present

## 2021-07-19 DIAGNOSIS — G8929 Other chronic pain: Secondary | ICD-10-CM | POA: Diagnosis not present

## 2021-07-19 DIAGNOSIS — M5440 Lumbago with sciatica, unspecified side: Secondary | ICD-10-CM | POA: Diagnosis not present

## 2021-07-19 DIAGNOSIS — M546 Pain in thoracic spine: Secondary | ICD-10-CM | POA: Diagnosis not present

## 2021-07-19 DIAGNOSIS — M25562 Pain in left knee: Secondary | ICD-10-CM | POA: Diagnosis not present

## 2021-07-19 DIAGNOSIS — M542 Cervicalgia: Secondary | ICD-10-CM | POA: Diagnosis not present

## 2021-07-19 DIAGNOSIS — M1712 Unilateral primary osteoarthritis, left knee: Secondary | ICD-10-CM

## 2021-07-19 NOTE — Progress Notes (Signed)
The patient comes in today for second opinion as a relates to his left knee.  He has known severe end-stage arthritis in his left knee but he has had a complex multiloculated ganglion cyst all around the proximal tibia at the articular aspect of the tibia and fibula joint.  He has seen Dr. Ennis Forts, who is an orthopedic surgeon in [facility] who then sent him to a dermatologist because this was complex.  He then eventually saw Dr. Leonides Schanz in [place] who is recommended an excision of the ganglion cyst and taking care of the bone prior to any type of knee replacement and getting that to heal.  I was able to review the patient's MRI and plain films today of the knee.  He has severe end-stage tricompartment arthritis of that knee.  On exam he does have varus malalignment of his left knee.  He can see where there has been a wound has been trying to heal on the anterior lateral aspect near the fibular head and this is consistent with a ganglion cyst.  The area feels firm and again the MRI shows that this is multiloculated and is very complex because he goes all around the fibular head and into the posterior aspect of the knee.  I agree with the fact that a knee replacement does not need to be done until his wound is healed completely and he likely needs an excision of this cyst but this is more complex and something that I have done and I agree with him following up with Dr. Leonides Schanz for that type of surgery.  I explained in detail why this is the case and I went over the films and MRI with him as well as a knee model.

## 2021-07-24 DIAGNOSIS — G4733 Obstructive sleep apnea (adult) (pediatric): Secondary | ICD-10-CM | POA: Diagnosis not present

## 2021-07-30 ENCOUNTER — Other Ambulatory Visit: Payer: Self-pay | Admitting: Family Medicine

## 2021-08-17 ENCOUNTER — Ambulatory Visit (INDEPENDENT_AMBULATORY_CARE_PROVIDER_SITE_OTHER): Payer: Medicare Other | Admitting: Family Medicine

## 2021-08-17 DIAGNOSIS — Z0000 Encounter for general adult medical examination without abnormal findings: Secondary | ICD-10-CM

## 2021-08-17 NOTE — Progress Notes (Signed)
MEDICARE ANNUAL WELLNESS VISIT  08/17/2021  Telephone Visit Disclaimer This Medicare AWV was conducted by telephone due to national recommendations for restrictions regarding the COVID-19 Pandemic (e.g. social distancing).  I verified, using two identifiers, that I am speaking with Mark Spitz. or their authorized healthcare agent. I discussed the limitations, risks, security, and privacy concerns of performing an evaluation and management service by telephone and the potential availability of an in-person appointment in the future. The patient expressed understanding and agreed to proceed.  Location of Patient: Home Location of Provider (nurse):  In the office.  Subjective:    Mark [name]. is a 61 y.o. male patient of Metheney, Rene Kocher, MD who had a Medicare Annual Wellness Visit today via telephone. Mark [name] is Legally disabled and lives with their spouse. he has 2 children. he reports that he is socially active and does interact with friends/family regularly. he is minimally physically active and enjoys playing videogames.  Patient Care Team: Hali Marry, MD as PCP - General (Family Medicine) Stanford Breed Denice Bors, MD as PCP - Cardiology (Cardiology) [name], MD as Consulting Physician (Neurosurgery) Dr. Hyman Bower (Neurology) Ollen Bowl, MD (Urology) Dohmeier, Asencion Partridge, MD as Consulting Physician (Neurology)  Advanced Directives 08/17/2021 09/27/2019 06/21/2019 05/18/2016 05/14/2016 04/05/2015 12/13/2014  Does Patient Have a Medical Advance Directive? Yes Yes Yes Yes Yes Yes Yes  Type of Advance Directive Living will [place];Living will [place];Living will [place];Living will [place];Living will [place];Living will [place]  Does patient want to make changes to medical advance directive? No - Patient declined - No - Patient  declined No - Patient declined - No - Patient declined No - Patient declined  Copy of [place] in Chart? - Yes - validated most recent copy scanned in chart (See row information) No - copy requested No - copy requested No - copy requested No - copy requested [facility] Utilization Over the Past 12 Months: # of hospitalizations or ER visits: 1 # of surgeries: 0  Review of Systems    Patient reports that his overall health is worse compared to last year.  History obtained from chart review and the patient  Patient Reported Readings (BP, Pulse, CBG, Weight, etc) none  Pain Assessment Pain : 0-10 Pain Score: 7  Pain Type: Chronic pain Pain Location: Generalized Pain Descriptors / Indicators: Constant Pain Onset: More than a month ago Pain Frequency: Constant Pain Relieving Factors: Medication, rest  Pain Relieving Factors: Medication, rest  Current Medications & Allergies (verified) Allergies as of 08/17/2021   No Known Allergies      Medication List        Accurate as of August 17, 2021  3:14 PM. If you have any questions, ask your nurse or doctor.          Advair HFA 115-21 MCG/ACT inhaler Generic drug: fluticasone-salmeterol Inhale 2 puffs into the lungs 2 (two) times daily.   albuterol 108 (90 Base) MCG/ACT inhaler Commonly known as: VENTOLIN HFA Inhale 2 puffs into the lungs every 6 (six) hours as needed for wheezing or shortness of breath.   amantadine 100 MG capsule Commonly known as: SYMMETREL Take 100 mg by mouth 3 (three) times daily.   amphetamine-dextroamphetamine 30 MG 24 hr capsule Commonly known as: ADDERALL XR Take 30 mg by mouth daily.   Cholecalciferol 25 MCG (1000 UT) capsule  Take 25 Units by mouth daily.   cyclobenzaprine 10 MG tablet Commonly known as: FLEXERIL Take 1 tablet (10 mg total) by mouth 3 (three) times daily as needed for muscle spasms.   DULoxetine 60 MG capsule Commonly known as: CYMBALTA Take 60  mg by mouth 2 (two) times daily.   fluticasone furoate-vilanterol 100-25 MCG/ACT Aepb Commonly known as: BREO ELLIPTA Inhale into the lungs.   furosemide 20 MG tablet Commonly known as: LASIX Take 1 tablet (20 mg total) by mouth daily as needed.   gabapentin 800 MG tablet Commonly known as: NEURONTIN Take 800 mg by mouth 4 (four) times daily.   HYDROcodone-acetaminophen 10-325 MG tablet Commonly known as: NORCO Take 2 tablets by mouth every 4 (four) hours as needed for severe pain.   ibuprofen 800 MG tablet Commonly known as: ADVIL Take 800 mg by mouth every 8 (eight) hours as needed for moderate pain.   losartan-hydrochlorothiazide 50-12.5 MG tablet Commonly known as: HYZAAR TAKE ONE TABLET BY MOUTH EVERY DAY   magic mouthwash Soln Swish, gargle and spit 10 mL every 6 hours as needed.  Diphenhydramine 12.5 mg / 5 mL, viscous lidocaine 2%, Maalox: mix 1:1:1   morphine 30 MG 12 hr tablet Commonly known as: MS CONTIN Take 1 tablet by mouth every 12 (twelve) hours.   predniSONE 20 MG tablet Commonly known as: DELTASONE Take 2 tablets (40 mg total) by mouth daily with breakfast.   tamsulosin 0.4 MG Caps capsule Commonly known as: FLOMAX Take 0.4 mg by mouth daily.   Teriflunomide 14 MG Tabs Take 14 mg by mouth at bedtime.   tiZANidine 4 MG tablet Commonly known as: ZANAFLEX Take 4 mg by mouth every 6 (six) hours as needed for muscle spasms.   tiZANidine 4 MG tablet Commonly known as: ZANAFLEX Take by mouth.        History (reviewed): Past Medical History:  Diagnosis Date   Anxiety    chronic pain treated with narcotics   Arthritis    Chronic pain    Constipation due to opioid therapy    COPD (chronic obstructive pulmonary disease) (HCC)    Depression    Dysphonia    thoracic left sided"impingement" with heminumbness   Dyspnea    GI bleed    abt 15 years ago   Hypertension    Morbid obesity ([place])    MS (multiple sclerosis) ([place]) 02/11/2013   ABAGIO   Per. Dr Bjorn Loser   Neuromuscular disorder [facility]) 2014   Pneumonia    Sleep apnea    Sleep apnea with use of continuous positive airway pressure (CPAP) 02/11/2013   07-13-12 AHi of 98.6 /hr titrated to 11 cm water ,  average user time 4 hours and 4 minutes. Residual AHI 1.20 December 2012 .   Umbilical hernia    Past Surgical History:  Procedure Laterality Date   ANTERIOR CERVICAL DECOMP/DISCECTOMY FUSION N/A 09/29/2019   Procedure: Anterior Cervical Decompression Fusion - Cervical Four-Cervical Five;  Surgeon: [name], MD;  Location: [place];  Service: Neurosurgery;  Laterality: N/A;  Anterior Cervical Decompression Fusion - Cervical Four-Cervical Five    CARPAL TUNNEL RELEASE Right    [name] TUNNEL RELEASE Right 09/29/2019   Procedure: Carpal Tunnel Release;  Surgeon: [name], MD;  Location: [place];  Service: Neurosurgery;  Laterality: Right;   COLONOSCOPY W/ POLYPECTOMY     ESOPHAGOGASTRODUODENOSCOPY     MULTIPLE TOOTH EXTRACTIONS     POSTERIOR LUMBAR FUSION  04/17/2015  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 They have two children. He enjoys playing video games.      Social Determinants of Health   Financial Resource Strain: Low Risk    Difficulty of Paying Living Expenses: Not hard at all  Food Insecurity: No Food Insecurity   Worried About Charity fundraiser in the Last Year: Never true   [place] in the Last Year: Never true  Transportation Needs: No Transportation Needs   Lack of Transportation (Medical): No   Lack of Transportation (Non-Medical): No  Physical Activity: Inactive   Days of Exercise per Week: 0 days   Minutes of Exercise per Session: 0 min  Stress: No Stress Concern Present   Feeling of Stress : Not at all  Social Connections: Moderately Integrated   Frequency of Communication with Friends and Family: More than three times a week   Frequency of Social Gatherings with Friends and Family: Never   Attends Religious Services: 1 to 4 times per year   Active Member of [employer] or Organizations: No   Attends Archivist Meetings: Never   Marital Status: Married    Activities of Daily Living In your present state of health, do you have any difficulty performing the following activities: 08/17/2021 08/15/2021  Hearing? Y Y  Comment bilateral hearing loss; needs hearing aids but they are able to afford them at this time. -  Vision? N N  Difficulty concentrating or making decisions? Y Y  Comment occasionally has some memory loss. -  Walking or climbing stairs? Y Y  Comment due to M.S. -  Dressing or bathing? N N  Doing errands, shopping? N N  Preparing Food and eating ? N N  Using the Toilet? N N  In the past six months, have you accidently leaked urine? N N  Do you have problems with loss of bowel control? N N  Managing your Medications? N N  Managing your Finances? N N  Housekeeping or managing your Housekeeping? N N  Some recent data might be hidden    Patient Education/ Literacy How often do you need to have someone help you when you read  instructions, pamphlets, or other written materials from your doctor or pharmacy?: 1 - Never What is the last grade level you completed in school?: 2 years of technical school.  Exercise Current Exercise Habits: The patient  does not participate in regular exercise at present, Exercise limited by: Other - see comments (due to MS.)  Diet Patient reports consuming  2-3  meals a day and 0 snack(s) a day Patient reports that his primary diet is: Regular Patient reports that she does have regular access to food.   Depression Screen PHQ 2/9 Scores 08/17/2021 02/20/2021 06/21/2019 11/12/2018 01/15/2018  PHQ - 2 Score 0 2 0 0 4  PHQ- 9 Score - 6 - - 10     Fall Risk Fall Risk  08/17/2021 08/15/2021 02/20/2021 06/21/2019 12/11/2016  Falls in the past year? 1 1 1 1  Yes  Number falls in past yr: 0 0 0 1 2 or more  Injury with Fall? 0 0 0 0 No  Risk for fall due to : History of fall(s) - - Impaired balance/gait -  Risk for fall due to: Comment - - - Has MS and lower back pain -  Follow up Falls evaluation completed;Education provided - Falls evaluation completed Falls prevention discussed -     Objective:  Mark Spitz. seemed alert and oriented and he participated appropriately during our telephone visit.  Blood Pressure Weight BMI  BP Readings from Last 3 Encounters:  07/03/21 122/82  06/26/21 127/76  06/12/21 (!) 102/55   Wt Readings from Last 3 Encounters:  07/03/21 (!) 304 lb (137.9 kg)  06/26/21 (!) 311 lb (141.1 kg)  06/12/21 (!) 313 lb (142 kg)   BMI Readings from Last 1 Encounters:  07/03/21 41.23 kg/m    *Unable to obtain current vital signs, weight, and BMI due to telephone visit type  Hearing/Vision  Mark [name] did  seem to have difficulty with hearing/understanding during the telephone conversation; his wife was able to help repeat the questions. Reports that he has not had a formal eye exam by an eye care professional within the past year Reports that he has not had a  formal hearing evaluation within the past year *Unable to fully assess hearing and vision during telephone visit type  Cognitive Function: 6CIT Screen 08/17/2021 06/21/2019  What Year? 0 points 0 points  What month? 0 points 0 points  What time? 0 points 0 points  Count back from 20 0 points 0 points  Months in reverse 0 points 0 points  Repeat phrase 0 points 0 points  Total Score 0 0   (Normal:0-7, Significant for Dysfunction: >8)  Normal Cognitive Function Screening: Yes   Immunization & Health Maintenance Record Immunization History  Administered Date(s) Administered   Tdap 01/04/2015    Health Maintenance  Topic Date Due   COVID-19 Vaccine (1) 09/02/2021 (Originally 07/19/1961)   Zoster Vaccines- Shingrix (1 of 2) 09/24/2021 (Originally 01/17/1981)   INFLUENZA VACCINE  10/19/2021 (Originally 02/19/2021)   COLONOSCOPY (Pts 45-27yrs Insurance coverage will need to be confirmed)  08/17/2022 (Originally 11/02/2018)   HIV Screening  08/17/2022 (Originally [birth year]/1977)   TETANUS/TDAP  01/03/2025   Hepatitis C Screening  Completed   HPV VACCINES  Aged Out       Assessment  This is a routine wellness examination for Mark [name].Marland Kitchen  Health Maintenance: Due or Overdue There are no preventive care reminders to display for this patient.   Mark Albert Morganti Jr. does not need a referral for [employer] Assistance: Care Management:   no Social Work:    no Prescription Assistance:  no Nutrition/Diabetes Education:  no   Plan:  Personalized Goals  Goals Addressed  This Visit's Progress     Patient Stated (pt-stated)        Would like to loose 10 lbs.       Personalized Health Maintenance & Screening Recommendations  Influenza vaccine Colorectal cancer screening Shingles vaccine  Patient is scheduled for a colonoscopy Dec 18, 2021. Patient declined the vaccines at this time.  Lung Cancer Screening Recommended: no (Low Dose CT Chest  recommended if Age 21-80 years, 30 pack-year currently smoking OR have quit w/in past 15 years) Hepatitis C Screening recommended: no HIV Screening recommended: yes  Advanced Directives: Written information was not prepared per patient's request.  Referrals & Orders No orders of the defined types were placed in this encounter.   Follow-up Plan Follow-up with Hali Marry, MD as planned Medicare wellness visit in one year. Patient will access AVS on my chart.   I have personally reviewed and noted the following in the patients chart:   Medical and social history Use of alcohol, tobacco or illicit drugs  Current medications and supplements Functional ability and status Nutritional status Physical activity Advanced directives List of other physicians Hospitalizations, surgeries, and ER visits in previous 12 months Vitals Screenings to include cognitive, depression, and falls Referrals and appointments  In addition, I have reviewed and discussed with Mark Spitz. certain preventive protocols, quality metrics, and best practice recommendations. A written personalized care plan for preventive services as well as general preventive health recommendations is available and can be mailed to the patient at his request.      Mark Gens, RN  08/17/2021

## 2021-08-17 NOTE — Patient Instructions (Addendum)
[place] Maintenance Summary and Written Plan of Care  [name] ,  Thank you for allowing me to perform your Medicare Annual Wellness Visit and for your ongoing commitment to your health.   Health Maintenance & Immunization History Health Maintenance  Topic Date Due   COVID-19 Vaccine (1) 09/02/2021 (Originally 07/19/1961)   Zoster Vaccines- Shingrix (1 of 2) 09/24/2021 (Originally 01/17/1981)   INFLUENZA VACCINE  10/19/2021 (Originally 02/19/2021)   COLONOSCOPY (Pts 45-64yrs Insurance coverage will need to be confirmed)  08/17/2022 (Originally 11/02/2018)   HIV Screening  08/17/2022 (Originally 01/17/1977)   TETANUS/TDAP  01/03/2025   Hepatitis C Screening  Completed   HPV VACCINES  Aged Out   Immunization History  Administered Date(s) Administered   Tdap 01/04/2015    These are the patient goals that we discussed:  Goals Addressed              This Visit's Progress     Patient Stated (pt-stated)        Would like to loose 10 lbs.        This is a list of Health Maintenance Items that are overdue or due now: There are no preventive care reminders to display for this patient.    Orders/Referrals Placed Today: Influenza vaccine Colorectal cancer screening Shingles vaccine  Patient is scheduled for a colonoscopy Dec 18, 2021. Patient declined the vaccines at this time.  (Contact our referral department at [phone] if you have not spoken with someone about your referral appointment within the next 5 days)    Follow-up Plan Follow-up with Hali Marry, MD as planned Medicare wellness visit in one year. Patient will access AVS on my chart.      Health Maintenance, Male Adopting a healthy lifestyle and getting preventive care are important in promoting health and wellness. Ask your health care provider about: The right schedule for you to have regular tests and exams. Things you can do on your own to  prevent diseases and keep yourself healthy. What should I know about diet, weight, and exercise? Eat a healthy diet  Eat a diet that includes plenty of vegetables, fruits, low-fat dairy products, and lean protein. Do not eat a lot of foods that are high in solid fats, added sugars, or sodium. Maintain a healthy weight Body mass index (BMI) is a measurement that can be used to identify possible weight problems. It estimates body fat based on height and weight. Your health care provider can help determine your BMI and help you achieve or maintain a healthy weight. Get regular exercise Get regular exercise. This is one of the most important things you can do for your health. Most adults should: Exercise for at least 150 minutes each week. The exercise should increase your heart rate and make you sweat (moderate-intensity exercise). Do strengthening exercises at least twice a week. This is in addition to the moderate-intensity exercise. Spend less time sitting. Even light physical activity can be beneficial. Watch cholesterol and blood lipids Have your blood tested for lipids and cholesterol at 60 years of age, then have this test every 5 years. You may need to have your cholesterol levels checked more often if: Your lipid or cholesterol levels are high. You are older than 60 years of age. You are at high risk for heart disease. What should I know about cancer screening? Many types of cancers can be detected early and may often be prevented. Depending on your health history and family history,  you may need to have cancer screening at various ages. This may include screening for: Colorectal cancer. Prostate cancer. Skin cancer. Lung cancer. What should I know about heart disease, diabetes, and high blood pressure? Blood pressure and heart disease High blood pressure causes heart disease and increases the risk of stroke. This is more likely to develop in people who have high blood pressure  readings or are overweight. Talk with your health care provider about your target blood pressure readings. Have your blood pressure checked: Every 3-5 years if you are 79-73 years of age. Every year if you are 43 years old or older. If you are between the ages of 90 and 21 and are a current or former smoker, ask your health care provider if you should have a one-time screening for abdominal aortic aneurysm (AAA). Diabetes Have regular diabetes screenings. This checks your fasting blood sugar level. Have the screening done: Once every three years after age 46 if you are at a normal weight and have a low risk for diabetes. More often and at a younger age if you are overweight or have a high risk for diabetes. What should I know about preventing infection? Hepatitis B If you have a higher risk for hepatitis B, you should be screened for this virus. Talk with your health care provider to find out if you are at risk for hepatitis B infection. Hepatitis C Blood testing is recommended for: Everyone born from [birth year] through 1964. Anyone with known risk factors for hepatitis C. Sexually transmitted infections (STIs) You should be screened each year for STIs, including gonorrhea and chlamydia, if: You are sexually active and are younger than 60 years of age. You are older than 60 years of age and your health care provider tells you that you are at risk for this type of infection. Your sexual activity has changed since you were last screened, and you are at increased risk for chlamydia or gonorrhea. Ask your health care provider if you are at risk. Ask your health care provider about whether you are at high risk for HIV. Your health care provider may recommend a prescription medicine to help prevent HIV infection. If you choose to take medicine to prevent HIV, you should first get tested for HIV. You should then be tested every 3 months for as long as you are taking the medicine. Follow these instructions  at home: Alcohol use Do not drink alcohol if your health care provider tells you not to drink. If you drink alcohol: Limit how much you have to 0-2 drinks a day. Know how much alcohol is in your drink. In the [place] one drink equals one 12 oz bottle of beer (355 mL), one 5 oz glass of wine (148 mL), or one 1 oz glass of hard liquor (44 mL). Lifestyle Do not use any products that contain nicotine or tobacco. These products include cigarettes, chewing tobacco, and vaping devices, such as e-cigarettes. If you need help quitting, ask your health care provider. Do not use street drugs. Do not share needles. Ask your health care provider for help if you need support or information about quitting drugs. General instructions Schedule regular health, dental, and eye exams. Stay current with your vaccines. Tell your health care provider if: You often feel depressed. You have ever been abused or do not feel safe at home. Summary Adopting a healthy lifestyle and getting preventive care are important in promoting health and wellness. Follow your health care provider's instructions about healthy diet,  exercising, and getting tested or screened for diseases. Follow your health care provider's instructions on monitoring your cholesterol and blood pressure. This information is not intended to replace advice given to you by your health care provider. Make sure you discuss any questions you have with your health care provider. Document Revised: 11/27/2020 Document Reviewed: 11/27/2020 Elsevier Patient Education  [place].

## 2021-08-17 NOTE — Addendum Note (Signed)
Addended by: Beatrice Lecher D on: 08/17/2021 03:19 PM   Modules accepted: Orders

## 2021-08-21 ENCOUNTER — Telehealth: Payer: Self-pay

## 2021-08-21 NOTE — Telephone Encounter (Signed)
° °  Pre-operative Risk Assessment    Patient Name: Mark [name].  DOB: 01-14-1961 MRN: [id number]      Request for Surgical Clearance    Procedure:   Left total knee replacement   Date of Surgery:  Clearance TBD                                 Surgeon:  Dr.Timothy Percell Miller Surgeon's Group or Practice Name:  [facility] Phone number:  [phone] Fax number:  [phone]   Type of Clearance Requested:   - Medical    Type of Anesthesia:  Not Indicated   Additional requests/questions:  Please advise surgeon/provider what medications should be held. Please fax a copy of clearance to the surgeon's office.  Mark [name]   08/21/2021, 4:13 PM

## 2021-08-22 ENCOUNTER — Telehealth: Payer: Self-pay | Admitting: Family Medicine

## 2021-08-22 NOTE — Telephone Encounter (Signed)
Left message for patient to return call.

## 2021-08-22 NOTE — Telephone Encounter (Signed)
Return call from patient's wife. Patient's wife advised of message and verbalized understanding. Patients wife stated patient will try to work on weight loss goals. Patients wife stated patient has a follow up appt. Scheduled in 09/2021 at the Ortho dr. Patient's wife stated hopefully patient will be at goal at that appt. And is able to have surgical clearance forms resubmitted at that time.

## 2021-08-22 NOTE — Telephone Encounter (Signed)
Hi Mark [name],   We received a request for medical clearance for your surgery and that I believe cardiology has received a cardiac clearance.  One of the standards is that your BMI has to be less than 40 before they are do the surgery and so I cannot sign off on this she did not last your BMI is 40.  I think the last time you were here we had a weight of about 304 in December.  That puts you at a BMI of around 41.  If you have not already then I would recommend really making some dietary changes and cutting back on carbs and sweets and sticking mostly to vegetables and lean proteins and whole grains.  We probably need to get her weight to about 1 90-1 95 for you to get a BMI less than 40.  If you need help with weight loss I am more than happy to schedule an appointment so we can discuss some strategies.  But in the short-term I would recommend just making some dietary changes cutting out sweetened beverages and trying to increase her activity level to burn more calories and then we can always have you come in for a nurse visit to do a weight check if you feel like you have reached your goal.

## 2021-08-23 NOTE — Telephone Encounter (Signed)
Patient has been followed by Dr. Stanford Breed mainly for hypertension and a thoracic aortic aneurysm.  She does not have any prior diagnosis of CAD or stroke.  He is morbidly obese with current weight 304 pounds.  Patient also followed by [facility] for multiple sclerosis as well.  Last echocardiogram obtained on 07/13/2019 showed EF 60 to 65%, and no regional wall motion abnormality, dilated ascending aorta measuring at 49 mm.   Talking with the patient's wife, it appears his mobility is mainly limited by morbid obesity, back pain and knee pain.  He can barely walk 1 block away from his home.  I do not think he can truly accomplish more than 4 METS of activity given the current condition..  Dr. Stanford Breed, would you recommend any additional evaluation prior to cardiac clearance?  His primary care provider recommends additional weight loss before cardiac clearance.  Please forward your recommendation to [facility] DIV PREOP

## 2021-08-23 NOTE — Telephone Encounter (Signed)
° ° °  Patient Name: Mark [name].  DOB: 1961-04-02 MRN: [id number]  Primary Cardiologist: Kirk Ruths, MD  Chart reviewed as part of pre-operative protocol coverage. Given past medical history and time since last visit, based on ACC/AHA guidelines, Tymothy Cass [employer]. would be at acceptable risk for the planned procedure without further cardiovascular testing.   I discussed the case with Dr. Stanford Breed, patient is cleared from the cardiac perspective to proceed with surgery.   The patient was advised that if he develops new symptoms prior to surgery to contact our office to arrange for a follow-up visit, and he verbalized understanding.  I will route this recommendation to the requesting party via Epic fax function and remove from pre-op pool.  Please call with questions.  [place], [place] 08/23/2021, 3:47 PM

## 2021-09-10 ENCOUNTER — Other Ambulatory Visit: Payer: Self-pay | Admitting: Family Medicine

## 2021-10-08 DIAGNOSIS — Z6841 Body Mass Index (BMI) 40.0 and over, adult: Secondary | ICD-10-CM | POA: Diagnosis not present

## 2021-10-08 DIAGNOSIS — M6740 Ganglion, unspecified site: Secondary | ICD-10-CM | POA: Diagnosis not present

## 2021-10-08 DIAGNOSIS — M1712 Unilateral primary osteoarthritis, left knee: Secondary | ICD-10-CM | POA: Diagnosis not present

## 2021-10-11 ENCOUNTER — Encounter: Payer: Self-pay | Admitting: Pulmonary Disease

## 2021-10-11 ENCOUNTER — Ambulatory Visit (INDEPENDENT_AMBULATORY_CARE_PROVIDER_SITE_OTHER): Payer: Medicare Other | Admitting: Pulmonary Disease

## 2021-10-11 ENCOUNTER — Other Ambulatory Visit: Payer: Self-pay

## 2021-10-11 VITALS — BP 130/72 | HR 90 | Temp 98.7°F | Ht 72.0 in | Wt 298.0 lb

## 2021-10-11 DIAGNOSIS — G4730 Sleep apnea, unspecified: Secondary | ICD-10-CM

## 2021-10-11 DIAGNOSIS — J984 Other disorders of lung: Secondary | ICD-10-CM | POA: Diagnosis not present

## 2021-10-11 NOTE — Patient Instructions (Signed)
? ?  X split night study ? ?X Obtain old sleep studies & PFTs from [facility] ?

## 2021-10-11 NOTE — Progress Notes (Signed)
? ?Subjective:  ? ? Patient ID: Mark [name]., male    DOB: [birth year], 61 y.o.   MRN: [id number] ? ?HPI ? ? ?Chief Complaint  ?Patient presents with  ? Consult  ?  Sleep consult. Pt states he is worried about lungs due to hx of RSV and PNA. Pt states he had a sleep study in the past. Is currently on cpap.   ? ?61 year old disabled [employer] presents to establish care for OSA and pulmonary issues.  He also needs preop clearance, left TKR is planned by Dr. Ennis Forts at [facility]  ?he has been following at the lung health and wellness clinic at [facility].  I have reviewed the records ?It seems that severe OSA was diagnosed in 2013 and he was maintained on CPAP of 11 cm with good improvement in his daytime somnolence and fatigue.  Last download reviewed from 2019 shows good control of events on 11 cm and good compliance.  He lost about 60 pounds to his current weight of 298 pounds over the past 3 years and he has stopped using the machine for the past couple of years.  His wife has noted loud snoring, witnessed apneas in spite of his sleeping in the upright position .  His machine is old and he wonders if he can get a new machine. ? ? ?Review of his pulmonary issues -he reports 6 episodes of pneumonia over the past 10 years, he was diagnosed with RSV infection about 2 months ago and has recovered.  Previous PFTs only showed restrictive lung disease.  He is also being treated for asthma with Breo but this inhaler did not really help him.  He is currently off all kinds of inhalers. ?Chest x-ray 05/2021 shows mildly elevated right hemidiaphragm which appears to be chronic. ?CT chest from 03/2021 shows clear lungs. ?He is a lifetime never smoker ? ?He reports exposure to asbestos and automobile brakes for 7 years ? ? ?PMH -multiple sclerosis  ?Thoracic aortic aneurysm 4.4 cm on CT chest 03/2021 ? ? ? ?Significant tests/ events reviewed ? ?N PSG 06/2012 -358 pounds -AHI 98/hour, corrected by CPAP 11 cm ? ? ? ?Past  Medical History:  ?Diagnosis Date  ? Anxiety   ? chronic pain treated with narcotics  ? Arthritis   ? Chronic pain   ? Constipation due to opioid therapy   ? COPD (chronic obstructive pulmonary disease) ([place])   ? Depression   ? Dysphonia   ? thoracic left sided"impingement" with heminumbness  ? Dyspnea   ? GI bleed   ? abt 15 years ago  ? Hypertension   ? Morbid obesity ([place])   ? MS (multiple sclerosis) ([place]) 02/11/2013  ? [name]  ? Neuromuscular disorder ([place]) 2014  ? Pneumonia   ? Sleep apnea   ? Sleep apnea with use of continuous positive airway pressure (CPAP) 02/11/2013  ? 07-13-12 AHi of 98.6 /hr titrated to 11 cm water ,  average user time 4 hours and 4 minutes. Residual AHI 1.20 December 2012 .  ? Umbilical hernia   ? ? ?Past Surgical History:  ?Procedure Laterality Date  ? ANTERIOR CERVICAL DECOMP/DISCECTOMY FUSION N/A 09/29/2019  ? Procedure: Anterior Cervical Decompression Fusion - Cervical Four-Cervical Five;  Surgeon: [name], MD;  Location: [place];  Service: Neurosurgery;  Laterality: N/A;  Anterior Cervical Decompression Fusion - Cervical Four-Cervical Five   ? CARPAL TUNNEL RELEASE Right   ? [name]  ? CARPAL TUNNEL RELEASE  Right 09/29/2019  ? Procedure: Carpal Tunnel Release;  Surgeon: [name], MD;  Location: [place];  Service: Neurosurgery;  Laterality: Right;  ? COLONOSCOPY W/ POLYPECTOMY    ? ESOPHAGOGASTRODUODENOSCOPY    ? MULTIPLE TOOTH EXTRACTIONS    ? POSTERIOR LUMBAR FUSION  04/17/2015  ? [name], also removed old hardware  ? SPINE SURGERY    ? total of six vertebras fused, three lumbar surgeries and one anterior neck fusion  ? TONSILLECTOMY    ? ? ?No Known Allergies ? ?Social History  ? ?Socioeconomic History  ? Marital status: Married  ?  Spouse name: [name]  ? Number of children: 2  ? Years of education: 12+  ? Highest education level: Some college, no degree  ?Occupational History  ? 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  Comment: disablity  ?Tobacco Use  ? Smoking status:  Never  ? Smokeless tobacco: Never  ?Vaping Use  ? Vaping Use: Never used  ?Substance and Sexual Activity  ? Alcohol use: Not Currently  ?  Comment: occasionally  ? Drug use: No  ? Sexual activity: Not Currently  ?Other Topics Concern  ? Not on file  ?Social History Narrative  ? Lives with his wife. They have two children. He enjoys playing video games.  ?   ? ?Social Determinants of Health  ? ?Financial Resource Strain: Low Risk   ? Difficulty of Paying Living Expenses: Not hard at all  ?Food Insecurity: No Food Insecurity  ? Worried About Charity fundraiser in the Last Year: Never true  ? Ran Out of Food in the Last Year: Never true  ?Transportation Needs: No Transportation Needs  ? Lack of Transportation (Medical): No  ? Lack of Transportation (Non-Medical): No  ?Physical Activity: Inactive  ? Days of Exercise per Week: 0 days  ? Minutes of Exercise per Session: 0 min  ?Stress: No Stress Concern Present  ? Feeling of Stress : Not at all  ?Social Connections: Moderately Integrated  ? Frequency of Communication with Friends and Family: More than three times a week  ? Frequency of Social Gatherings with Friends and Family: Never  ? Attends Religious Services: 1 to 4 times per year  ? Active Member of Clubs or Organizations: No  ? Attends Archivist Meetings: Never  ? Marital Status: Married  ?Intimate Partner Violence: Not At Risk  ? Fear of Current or Ex-Partner: No  ? Emotionally Abused: No  ? Physically Abused: No  ? Sexually Abused: No  ? ? ? ?Family History  ?Problem Relation Age of Onset  ? Arrhythmia Mother   ? Cancer Mother   ? Cancer Other   ? Cancer Other   ? Sleep walking Son   ? Obesity Sister   ? ? ? ? ?Review of Systems ? ?Knee pain, stiff joints ? ?Constitutional: negative for anorexia, fevers and sweats  ?Eyes: negative for irritation, redness and visual disturbance  ?Ears, nose, mouth, throat, and face: negative for earaches, epistaxis, nasal congestion and sore throat  ?Respiratory:  negative for cough, dyspnea on exertion, sputum and wheezing  ?Cardiovascular: negative for chest pain, dyspnea, orthopnea, palpitations and syncope  ?Gastrointestinal: negative for abdominal pain, constipation, diarrhea, melena, nausea and vomiting  ?Genitourinary:negative for dysuria, frequency and hematuria  ?Hematologic/lymphatic: negative for bleeding, easy bruising and lymphadenopathy  ?Musculoskeletal:negative for arthralgias, muscle weakness  ?Neurological: negative for coordination problems, gait problems, headaches and weakness  ?Endocrine: negative for diabetic symptoms including polydipsia, polyuria and weight loss ? ?   ?  Objective:  ? Physical Exam ? ?Gen. Pleasant, obese, in no distress, normal affect ?ENT - no pallor,icterus, no post nasal drip, class 2-3 airway ?Neck: No JVD, no thyromegaly, no carotid bruits ?Lungs: no use of accessory muscles, no dullness to percussion, decreased without rales or rhonchi  ?Cardiovascular: Rhythm regular, heart sounds  normal, no murmurs or gallops, 1+ peripheral edema ?Abdomen: soft and non-tender, no hepatosplenomegaly, BS normal. ?Musculoskeletal: No deformities, no cyanosis or clubbing ?Neuro:  alert, non focal, no tremors ? ? ? ?   ?Assessment & Plan:  ? ? ?

## 2021-10-11 NOTE — Assessment & Plan Note (Addendum)
We will obtain his PFTs and previous sleep studies from his previous pulmonologist.  I doubt that we need to repeat his PFTs now ?Weight loss has helped some,. ?Based on review of these tests we will likely clear him for knee surgery with due risk ?We will also be evaluated by cardiology for cardiovascular risk assessment ?

## 2021-10-11 NOTE — Assessment & Plan Note (Signed)
He has lost 60 pounds since his initial study that showed severe OSA in 2013.  However he is still symptomatic and I am convinced he still has residual OSA.  He would like a reevaluation we will proceed with a split-night study.  Based on this we will try to provide him with a replacement CPAP. ?We discussed cardiovascular consequences of severe OSA and he is willing to get back on his machine. ?He is still quite overweight and would not qualify for hypoglossal nerve stimulator ? ?Weight loss encouraged, compliance with goal of at least 4-6 hrs every night is the expectation. ?Advised against medications with sedative side effects ?Cautioned against driving when sleepy - understanding that sleepiness will vary on a day to day basis ? ?

## 2021-10-25 ENCOUNTER — Telehealth: Payer: Self-pay | Admitting: Pulmonary Disease

## 2021-10-25 NOTE — Telephone Encounter (Signed)
Obtain PFTs from [facility] sleep [facility] ?Review shows moderate restriction without any evidence of airway obstruction. ?PFTs 12/2018 show ratio 81 with FVC 52%, TLC 44% and DLCO 84% ? ?PFTs 03/2018 show ratio 78 with FVC 78%, TLC 65% with DLCO 85% ?

## 2021-11-14 DIAGNOSIS — M6740 Ganglion, unspecified site: Secondary | ICD-10-CM | POA: Diagnosis not present

## 2021-11-15 ENCOUNTER — Ambulatory Visit (HOSPITAL_BASED_OUTPATIENT_CLINIC_OR_DEPARTMENT_OTHER): Payer: Medicare Other | Attending: Pulmonary Disease | Admitting: Pulmonary Disease

## 2021-11-15 DIAGNOSIS — G478 Other sleep disorders: Secondary | ICD-10-CM | POA: Insufficient documentation

## 2021-11-15 DIAGNOSIS — G4733 Obstructive sleep apnea (adult) (pediatric): Secondary | ICD-10-CM | POA: Diagnosis not present

## 2021-11-15 DIAGNOSIS — G4736 Sleep related hypoventilation in conditions classified elsewhere: Secondary | ICD-10-CM | POA: Diagnosis not present

## 2021-11-15 DIAGNOSIS — G4731 Primary central sleep apnea: Secondary | ICD-10-CM | POA: Diagnosis not present

## 2021-11-15 DIAGNOSIS — E669 Obesity, unspecified: Secondary | ICD-10-CM | POA: Diagnosis not present

## 2021-11-15 DIAGNOSIS — R0683 Snoring: Secondary | ICD-10-CM | POA: Diagnosis not present

## 2021-11-15 DIAGNOSIS — Z6841 Body Mass Index (BMI) 40.0 and over, adult: Secondary | ICD-10-CM | POA: Insufficient documentation

## 2021-11-15 DIAGNOSIS — G4730 Sleep apnea, unspecified: Secondary | ICD-10-CM

## 2021-11-15 DIAGNOSIS — I493 Ventricular premature depolarization: Secondary | ICD-10-CM | POA: Insufficient documentation

## 2021-11-20 ENCOUNTER — Other Ambulatory Visit: Payer: Self-pay

## 2021-11-20 DIAGNOSIS — G4730 Sleep apnea, unspecified: Secondary | ICD-10-CM

## 2021-11-20 NOTE — Procedures (Signed)
Patient Name: Mark [name], Brandy ?Study Date: 11/15/2021 ?Gender: Male ?D.O.B: 1961/02/19 ?Age (years): 42 ?Referring Provider: Kara Mead MD, ABSM ?Height (inches): 70 ?Interpreting Physician: Kara Mead MD, ABSM ?Weight (lbs): 300 ?RPSGT: Baxter Flattery ?BMI: 43 ?MRN: [id number] ?Neck Size: 19.00 ?<br> <br> ?CLINICAL INFORMATION ?Sleep Study Type: NPSG ? ? ? ?Indication for sleep study: Obesity, Snoring, Witnesses Apnea / Gasping During Sleep, reassessment of known OSA after weight loss ?N PSG 06/2012 -358 pounds -AHI 98/hour, corrected by CPAP 11 cm ? ? ? ?Epworth Sleepiness Score: 13 ? ? ? ?SLEEP STUDY TECHNIQUE ?As per the AASM Manual for the Scoring of Sleep and Associated Events v2.3 (April 2016) with a hypopnea requiring 4% desaturations. ? ?The channels recorded and monitored were frontal, central and occipital EEG, electrooculogram (EOG), submentalis EMG (chin), nasal and oral airflow, thoracic and abdominal wall motion, anterior tibialis EMG, snore microphone, electrocardiogram, and pulse oximetry. ? ?MEDICATIONS ?Medications self-administered by patient taken the night of the study : N/A ? ?SLEEP ARCHITECTURE ?The study was initiated at 11:05:08 PM and ended at 5:02:24 AM. ? ?Sleep onset time was 6.3 minutes and the sleep efficiency was 87.9%%. The total sleep time was 314 minutes. ? ?Stage REM latency was 229.5 minutes. ? ?The patient spent 2.5%% of the night in stage N1 sleep, 90.0%% in stage N2 sleep, 0.0%% in stage N3 and 7.5% in REM. ? ?Alpha intrusion was absent. ? ?Supine sleep was 100.00%. ? ?RESPIRATORY PARAMETERS ?The overall apnea/hypopnea index (AHI) was 34.2 per hour. There were 75 total apneas, including 75 obstructive, 0 central and 0 mixed apneas. There were 104 hypopneas and 0 RERAs. ? ?The AHI during Stage REM sleep was 66.4 per hour. ? ?AHI while supine was 34.2 per hour. ? ?The mean oxygen saturation was 89.1%. The minimum SpO2 during sleep was 62.0%. ? ?loud snoring was noted during  this study. ? ?CARDIAC DATA ?The 2 lead EKG demonstrated sinus rhythm. The mean heart rate was 63.5 beats per minute. Other EKG findings include: PVCs. ? ?LEG MOVEMENT DATA ?The total PLMS were 0 with a resulting PLMS index of 0.0. Associated arousal with leg movement index was 0.0 . ? ?IMPRESSIONS ?- Severe obstructive sleep apnea occurred during this study (AHI = 34.2/h). ?- Severe oxygen desaturation was noted during this study (Min O2 = 62.0%). 2L O2 was added about 3:34 am ?- The patient snored with loud snoring volume. ?- EKG findings include PVCs. ?- Clinically significant periodic limb movements did not occur during sleep. No significant associated arousals. ? ? ?DIAGNOSIS ?- Obstructive Sleep Apnea (G47.33) ?- Nocturnal Hypoxemia (G47.36) ? ? ?RECOMMENDATIONS ?- Therapeutic CPAP titration to determine optimal pressure required to alleviate sleep disordered breathing. ?- Add 2 L O2 during sleep ?- Positional therapy avoiding supine position during sleep. ?- Avoid alcohol, sedatives and other CNS depressants that may worsen sleep apnea and disrupt normal sleep architecture. ?- Sleep hygiene should be reviewed to assess factors that may improve sleep quality. ?- Weight management and regular exercise should be initiated or continued if appropriate. ? ? ?Kara Mead MD ?Board Certified in Sleep medicine ? ?

## 2021-11-21 DIAGNOSIS — M19072 Primary osteoarthritis, left ankle and foot: Secondary | ICD-10-CM | POA: Diagnosis not present

## 2021-11-21 DIAGNOSIS — M5412 Radiculopathy, cervical region: Secondary | ICD-10-CM | POA: Diagnosis not present

## 2021-11-21 DIAGNOSIS — M238X2 Other internal derangements of left knee: Secondary | ICD-10-CM | POA: Diagnosis not present

## 2021-11-21 DIAGNOSIS — I10 Essential (primary) hypertension: Secondary | ICD-10-CM | POA: Diagnosis not present

## 2021-11-21 DIAGNOSIS — Z9889 Other specified postprocedural states: Secondary | ICD-10-CM | POA: Diagnosis not present

## 2021-11-21 DIAGNOSIS — N400 Enlarged prostate without lower urinary tract symptoms: Secondary | ICD-10-CM | POA: Diagnosis not present

## 2021-11-21 DIAGNOSIS — G4730 Sleep apnea, unspecified: Secondary | ICD-10-CM | POA: Diagnosis not present

## 2021-11-21 DIAGNOSIS — N319 Neuromuscular dysfunction of bladder, unspecified: Secondary | ICD-10-CM | POA: Diagnosis not present

## 2021-11-21 DIAGNOSIS — I350 Nonrheumatic aortic (valve) stenosis: Secondary | ICD-10-CM | POA: Diagnosis not present

## 2021-11-21 DIAGNOSIS — Z9089 Acquired absence of other organs: Secondary | ICD-10-CM | POA: Diagnosis not present

## 2021-11-21 DIAGNOSIS — G35 Multiple sclerosis: Secondary | ICD-10-CM | POA: Diagnosis not present

## 2021-11-21 DIAGNOSIS — F32A Depression, unspecified: Secondary | ICD-10-CM | POA: Diagnosis not present

## 2021-11-21 DIAGNOSIS — G5732 Lesion of lateral popliteal nerve, left lower limb: Secondary | ICD-10-CM | POA: Diagnosis not present

## 2021-11-21 DIAGNOSIS — Z9989 Dependence on other enabling machines and devices: Secondary | ICD-10-CM | POA: Diagnosis not present

## 2021-11-21 DIAGNOSIS — M1712 Unilateral primary osteoarthritis, left knee: Secondary | ICD-10-CM | POA: Diagnosis not present

## 2021-11-21 DIAGNOSIS — M5137 Other intervertebral disc degeneration, lumbosacral region: Secondary | ICD-10-CM | POA: Diagnosis not present

## 2021-11-21 DIAGNOSIS — Z6841 Body Mass Index (BMI) 40.0 and over, adult: Secondary | ICD-10-CM | POA: Diagnosis not present

## 2021-11-21 DIAGNOSIS — M67462 Ganglion, left knee: Secondary | ICD-10-CM | POA: Diagnosis not present

## 2021-11-22 DIAGNOSIS — I350 Nonrheumatic aortic (valve) stenosis: Secondary | ICD-10-CM | POA: Diagnosis not present

## 2021-11-22 DIAGNOSIS — G4730 Sleep apnea, unspecified: Secondary | ICD-10-CM | POA: Diagnosis not present

## 2021-11-22 DIAGNOSIS — Z9989 Dependence on other enabling machines and devices: Secondary | ICD-10-CM | POA: Diagnosis not present

## 2021-11-22 DIAGNOSIS — M19072 Primary osteoarthritis, left ankle and foot: Secondary | ICD-10-CM | POA: Diagnosis not present

## 2021-11-22 DIAGNOSIS — N400 Enlarged prostate without lower urinary tract symptoms: Secondary | ICD-10-CM | POA: Diagnosis not present

## 2021-11-22 DIAGNOSIS — M5412 Radiculopathy, cervical region: Secondary | ICD-10-CM | POA: Diagnosis not present

## 2021-11-22 DIAGNOSIS — G35 Multiple sclerosis: Secondary | ICD-10-CM | POA: Diagnosis not present

## 2021-11-22 DIAGNOSIS — Z9089 Acquired absence of other organs: Secondary | ICD-10-CM | POA: Diagnosis not present

## 2021-11-22 DIAGNOSIS — M67462 Ganglion, left knee: Secondary | ICD-10-CM | POA: Diagnosis not present

## 2021-11-22 DIAGNOSIS — N319 Neuromuscular dysfunction of bladder, unspecified: Secondary | ICD-10-CM | POA: Diagnosis not present

## 2021-11-22 DIAGNOSIS — M1712 Unilateral primary osteoarthritis, left knee: Secondary | ICD-10-CM | POA: Diagnosis not present

## 2021-11-22 DIAGNOSIS — Z6841 Body Mass Index (BMI) 40.0 and over, adult: Secondary | ICD-10-CM | POA: Diagnosis not present

## 2021-11-22 DIAGNOSIS — I10 Essential (primary) hypertension: Secondary | ICD-10-CM | POA: Diagnosis not present

## 2021-11-22 DIAGNOSIS — M5137 Other intervertebral disc degeneration, lumbosacral region: Secondary | ICD-10-CM | POA: Diagnosis not present

## 2021-11-22 DIAGNOSIS — F32A Depression, unspecified: Secondary | ICD-10-CM | POA: Diagnosis not present

## 2021-11-23 DIAGNOSIS — N400 Enlarged prostate without lower urinary tract symptoms: Secondary | ICD-10-CM | POA: Diagnosis not present

## 2021-11-23 DIAGNOSIS — M5137 Other intervertebral disc degeneration, lumbosacral region: Secondary | ICD-10-CM | POA: Diagnosis not present

## 2021-11-23 DIAGNOSIS — G35 Multiple sclerosis: Secondary | ICD-10-CM | POA: Diagnosis not present

## 2021-11-23 DIAGNOSIS — I10 Essential (primary) hypertension: Secondary | ICD-10-CM | POA: Diagnosis not present

## 2021-11-23 DIAGNOSIS — F32A Depression, unspecified: Secondary | ICD-10-CM | POA: Diagnosis not present

## 2021-11-23 DIAGNOSIS — M67462 Ganglion, left knee: Secondary | ICD-10-CM | POA: Diagnosis not present

## 2021-11-23 DIAGNOSIS — M1712 Unilateral primary osteoarthritis, left knee: Secondary | ICD-10-CM | POA: Diagnosis not present

## 2021-11-23 DIAGNOSIS — I350 Nonrheumatic aortic (valve) stenosis: Secondary | ICD-10-CM | POA: Diagnosis not present

## 2021-11-23 DIAGNOSIS — Z9089 Acquired absence of other organs: Secondary | ICD-10-CM | POA: Diagnosis not present

## 2021-11-23 DIAGNOSIS — M5412 Radiculopathy, cervical region: Secondary | ICD-10-CM | POA: Diagnosis not present

## 2021-11-23 DIAGNOSIS — N319 Neuromuscular dysfunction of bladder, unspecified: Secondary | ICD-10-CM | POA: Diagnosis not present

## 2021-11-23 DIAGNOSIS — M19072 Primary osteoarthritis, left ankle and foot: Secondary | ICD-10-CM | POA: Diagnosis not present

## 2021-11-23 DIAGNOSIS — Z9989 Dependence on other enabling machines and devices: Secondary | ICD-10-CM | POA: Diagnosis not present

## 2021-11-23 DIAGNOSIS — G4730 Sleep apnea, unspecified: Secondary | ICD-10-CM | POA: Diagnosis not present

## 2021-11-23 DIAGNOSIS — Z6841 Body Mass Index (BMI) 40.0 and over, adult: Secondary | ICD-10-CM | POA: Diagnosis not present

## 2021-11-24 DIAGNOSIS — I10 Essential (primary) hypertension: Secondary | ICD-10-CM | POA: Diagnosis not present

## 2021-11-24 DIAGNOSIS — G35 Multiple sclerosis: Secondary | ICD-10-CM | POA: Diagnosis not present

## 2021-11-24 DIAGNOSIS — M5137 Other intervertebral disc degeneration, lumbosacral region: Secondary | ICD-10-CM | POA: Diagnosis not present

## 2021-11-24 DIAGNOSIS — M67462 Ganglion, left knee: Secondary | ICD-10-CM | POA: Diagnosis not present

## 2021-11-24 DIAGNOSIS — M19072 Primary osteoarthritis, left ankle and foot: Secondary | ICD-10-CM | POA: Diagnosis not present

## 2021-11-24 DIAGNOSIS — G4730 Sleep apnea, unspecified: Secondary | ICD-10-CM | POA: Diagnosis not present

## 2021-11-24 DIAGNOSIS — F32A Depression, unspecified: Secondary | ICD-10-CM | POA: Diagnosis not present

## 2021-11-24 DIAGNOSIS — M5412 Radiculopathy, cervical region: Secondary | ICD-10-CM | POA: Diagnosis not present

## 2021-11-24 DIAGNOSIS — M1712 Unilateral primary osteoarthritis, left knee: Secondary | ICD-10-CM | POA: Diagnosis not present

## 2021-11-24 DIAGNOSIS — N319 Neuromuscular dysfunction of bladder, unspecified: Secondary | ICD-10-CM | POA: Diagnosis not present

## 2021-11-24 DIAGNOSIS — N400 Enlarged prostate without lower urinary tract symptoms: Secondary | ICD-10-CM | POA: Diagnosis not present

## 2021-11-24 DIAGNOSIS — Z9989 Dependence on other enabling machines and devices: Secondary | ICD-10-CM | POA: Diagnosis not present

## 2021-11-24 DIAGNOSIS — Z6841 Body Mass Index (BMI) 40.0 and over, adult: Secondary | ICD-10-CM | POA: Diagnosis not present

## 2021-11-24 DIAGNOSIS — Z9089 Acquired absence of other organs: Secondary | ICD-10-CM | POA: Diagnosis not present

## 2021-11-24 DIAGNOSIS — I350 Nonrheumatic aortic (valve) stenosis: Secondary | ICD-10-CM | POA: Diagnosis not present

## 2021-12-06 ENCOUNTER — Other Ambulatory Visit: Payer: Self-pay | Admitting: Family Medicine

## 2021-12-10 DIAGNOSIS — Z9889 Other specified postprocedural states: Secondary | ICD-10-CM | POA: Diagnosis not present

## 2021-12-10 DIAGNOSIS — M1712 Unilateral primary osteoarthritis, left knee: Secondary | ICD-10-CM | POA: Diagnosis not present

## 2021-12-10 DIAGNOSIS — Z4789 Encounter for other orthopedic aftercare: Secondary | ICD-10-CM | POA: Diagnosis not present

## 2021-12-18 DIAGNOSIS — M546 Pain in thoracic spine: Secondary | ICD-10-CM | POA: Diagnosis not present

## 2021-12-18 DIAGNOSIS — G35 Multiple sclerosis: Secondary | ICD-10-CM | POA: Diagnosis not present

## 2021-12-18 DIAGNOSIS — M542 Cervicalgia: Secondary | ICD-10-CM | POA: Diagnosis not present

## 2021-12-18 DIAGNOSIS — M5440 Lumbago with sciatica, unspecified side: Secondary | ICD-10-CM | POA: Diagnosis not present

## 2021-12-19 ENCOUNTER — Ambulatory Visit: Payer: Medicare Other | Admitting: Pulmonary Disease

## 2021-12-19 ENCOUNTER — Encounter: Payer: Self-pay | Admitting: Pulmonary Disease

## 2021-12-19 VITALS — BP 128/74 | HR 76 | Temp 98.3°F | Ht 72.0 in | Wt 300.8 lb

## 2021-12-19 DIAGNOSIS — J984 Other disorders of lung: Secondary | ICD-10-CM | POA: Diagnosis not present

## 2021-12-19 DIAGNOSIS — G4730 Sleep apnea, unspecified: Secondary | ICD-10-CM | POA: Diagnosis not present

## 2021-12-19 DIAGNOSIS — G4733 Obstructive sleep apnea (adult) (pediatric): Secondary | ICD-10-CM

## 2021-12-19 NOTE — Assessment & Plan Note (Signed)
He does not seem to have airway obstruction.  Does not need Breo or Advair

## 2021-12-19 NOTE — Progress Notes (Signed)
   Subjective:    Patient ID: Mark [name]., male    DOB: 18-Oct-1960, 61 y.o.   MRN: [id number]  HPI  61 year old disabled [employer] , never smoker for follow-up of OSA and recurrent pneumonia  PMH -multiple sclerosis He reports exposure to asbestos and automobile brakes for 7 years  Initial office visit 09/2021 was for preop clearance.  He underwent knee surgery with removal of ganglion cyst and part of fibula.  Knee replacement is planned in the future.  He is now wearing a knee brace.  He was placed on Breo due to chronic cough and recurrent pneumonia.  We reviewed PFTs that only show mild restriction. He has lost 60 pounds to his current weight of 300 pounds.  He is unable to tolerate the CPAP machine.  We reviewed repeat sleep study  Significant tests/ events reviewed  CT chest from 03/2021 shows clear lungs, Thoracic aortic aneurysm 4.4 cm   PFTs 12/2018 show ratio 81 with FVC 52%, TLC 44% and DLCO 84%   PFTs 03/2018 show ratio 78 with FVC 78%, TLC 65% with DLCO 85%  NPSG 10/2021 severe, AHI 34/h, lowest desat 62% , 2L O2 added N PSG 06/2012 -358 pounds -AHI 98/hour, corrected by CPAP 11 cm   Review of Systems neg for any significant sore throat, dysphagia, itching, sneezing, nasal congestion or excess/ purulent secretions, fever, chills, sweats, unintended wt loss, pleuritic or exertional cp, hempoptysis, orthopnea pnd or change in chronic leg swelling. Also denies presyncope, palpitations, heartburn, abdominal pain, nausea, vomiting, diarrhea or change in bowel or urinary habits, dysuria,hematuria, rash, arthralgias, visual complaints, headache, numbness weakness or ataxia.     Objective:   Physical Exam   Gen. Pleasant, obese, in no distress ENT - no lesions, no post nasal drip Neck: No JVD, no thyromegaly, no carotid bruits Lungs: no use of accessory muscles, no dullness to percussion, decreased without rales or rhonchi  Cardiovascular: Rhythm regular, heart  sounds  normal, no murmurs or gallops, no peripheral edema Musculoskeletal: No deformities, no cyanosis or clubbing , no tremors Lt knee brace       Assessment & Plan:

## 2021-12-19 NOTE — Patient Instructions (Signed)
  X HST with you sleeping upright  Meantime, try to use your CPAP  X ENT referral for inspire, you need to lose about 30 lbs

## 2021-12-19 NOTE — Assessment & Plan Note (Addendum)
Sleep study shows improvement but still quite severe OSA with AHI 32/hour and lowest desaturation 62%.  Unfortunately he was unable to tolerate CPAP.  2 L of oxygen were added during the study but this would be suboptimal. I explained to him various treatment options.  Dental appliance would be suboptimal and his BMI is too high for hypoglossal nerve stimulator implant.  I advised him at least to lose 30 pounds.  We will proceed with ENT referral in the meantime. I offered him more CPAP desensitization but  he will continue to try this at home. He states that he does not snore when he sleeps upright.  We will check a home sleep test with him sleeping in the upright position  Weight loss encouraged, compliance with goal of at least 4-6 hrs every night is the expectation. Advised against medications with sedative side effects Cautioned against driving when sleepy - understanding that sleepiness will vary on a day to day basis

## 2022-01-18 DIAGNOSIS — M1712 Unilateral primary osteoarthritis, left knee: Secondary | ICD-10-CM | POA: Diagnosis not present

## 2022-01-18 DIAGNOSIS — M6740 Ganglion, unspecified site: Secondary | ICD-10-CM | POA: Diagnosis not present

## 2022-01-25 DIAGNOSIS — G35 Multiple sclerosis: Secondary | ICD-10-CM | POA: Diagnosis not present

## 2022-02-04 ENCOUNTER — Ambulatory Visit: Payer: Medicare Other | Attending: Pulmonary Disease | Admitting: Pulmonary Disease

## 2022-02-04 DIAGNOSIS — G4730 Sleep apnea, unspecified: Secondary | ICD-10-CM

## 2022-02-04 DIAGNOSIS — I493 Ventricular premature depolarization: Secondary | ICD-10-CM | POA: Insufficient documentation

## 2022-02-04 DIAGNOSIS — G4733 Obstructive sleep apnea (adult) (pediatric): Secondary | ICD-10-CM | POA: Insufficient documentation

## 2022-02-05 ENCOUNTER — Other Ambulatory Visit: Payer: Self-pay

## 2022-02-05 DIAGNOSIS — G4733 Obstructive sleep apnea (adult) (pediatric): Secondary | ICD-10-CM | POA: Diagnosis not present

## 2022-02-05 NOTE — Progress Notes (Signed)
amb  

## 2022-02-05 NOTE — Procedures (Signed)
Patient Name: Mark [name], Mark [name] Date: 02/04/2022 Gender: Male D.O.B: Oct 13, 1960 Age (years): 72 Referring Provider: Kara Mead MD, ABSM Height (inches): 72 Interpreting Physician: Kara Mead MD, ABSM Weight (lbs): 295 RPSGT: Rosebud Poles BMI: 40 MRN: [id number] Neck Size: 19.00 <br> <br> CLINICAL INFORMATION The patient is referred for a CPAP titration to treat sleep apnea.    Date of NPSG: not able to tolerate CPAP NPSG 10/2021 severe, AHI 34/h, lowest desat 62% , 2L O2 added N PSG 06/2012 -358 pounds -AHI 98/hour, corrected by CPAP 11 cm  SLEEP STUDY TECHNIQUE As per the AASM Manual for the Scoring of Sleep and Associated Events v2.3 (April 2016) with a hypopnea requiring 4% desaturations.  The channels recorded and monitored were frontal, central and occipital EEG, electrooculogram (EOG), submentalis EMG (chin), nasal and oral airflow, thoracic and abdominal wall motion, anterior tibialis EMG, snore microphone, electrocardiogram, and pulse oximetry. Continuous positive airway pressure (CPAP) was initiated at the beginning of the study and titrated to treat sleep-disordered breathing.  MEDICATIONS Medications self-administered by patient taken the night of the study : N/A  TECHNICIAN COMMENTS Comments added by technician: CPAP therapy started at 4 CWP, with an EPR of 3. Patietn toleated CPAP very well. Patient seemed to be AIR hungry throughout titration, labored breathng noticed. Study TRT increased to observe REM stage, but patient did not obtain significant REM stage. Titration increased to 15 CWP, with an EPR of 1, due to hyponeic-like events throughout study. Suboptimal pressure obtained, significant REM-supine stage not observed  RESPIRATORY PARAMETERS Optimal PAP Pressure (cm): 15 AHI at Optimal Pressure (/hr): 2.3 Overall Minimal O2 (%): 87.00 Supine % at Optimal Pressure (%): 100 Minimal O2 at Optimal Pressure (%): 85.00   SLEEP ARCHITECTURE The study was  initiated at 11:08:29 PM and ended at 6:16:20 AM.  Sleep onset time was 0.8 minutes and the sleep efficiency was 89.1%. The total sleep time was 381.1 minutes.  The patient spent 7.35% of the night in stage N1 sleep, 71.40% in stage N2 sleep, 19.68% in stage N3 and 1.6% in REM.Stage REM latency was 315.0 minutes  Wake after sleep onset was 46.0. Alpha intrusion was absent. Supine sleep was 100.00%.  CARDIAC DATA The 2 lead EKG demonstrated sinus rhythm. The mean heart rate was 59.61 beats per minute. Other EKG findings include: PVCs.   LEG MOVEMENT DATA The total Periodic Limb Movements of Sleep (PLMS) were 0. The PLMS index was 0.00. A PLMS index of <15 is considered normal in adults.  IMPRESSIONS - An optimal PAP pressure of 15 cm was selected for this patient based on the available study data. - Moderate oxygen desaturations were observed during this titration (min O2 = 87.00%). - The patient snored with moderate snoring volume during this titration study. - 2-lead EKG demonstrated: PVCs - Clinically significant periodic limb movements were not noted during this study. Arousals associated with PLMs were rare.   DIAGNOSIS - Obstructive Sleep Apnea (G47.33)   RECOMMENDATIONS - Recommend a trial of Auto-CPAP 8-16  cm H2O with medium airfit F 20 mask - Avoid alcohol, sedatives and other CNS depressants that may worsen sleep apnea and disrupt normal sleep architecture. - Sleep hygiene should be reviewed to assess factors that may improve sleep quality. - Weight management and regular exercise should be initiated or continued. - Return to Sleep Center for re-evaluation after 4 weeks of therapy  Kara Mead MD Board Certified in [place]

## 2022-02-20 DIAGNOSIS — G4730 Sleep apnea, unspecified: Secondary | ICD-10-CM | POA: Diagnosis not present

## 2022-02-27 ENCOUNTER — Encounter: Payer: Self-pay | Admitting: Pulmonary Disease

## 2022-02-27 DIAGNOSIS — M542 Cervicalgia: Secondary | ICD-10-CM | POA: Diagnosis not present

## 2022-02-27 DIAGNOSIS — M546 Pain in thoracic spine: Secondary | ICD-10-CM | POA: Diagnosis not present

## 2022-02-27 DIAGNOSIS — F1120 Opioid dependence, uncomplicated: Secondary | ICD-10-CM | POA: Diagnosis not present

## 2022-02-27 DIAGNOSIS — M5440 Lumbago with sciatica, unspecified side: Secondary | ICD-10-CM | POA: Diagnosis not present

## 2022-03-02 ENCOUNTER — Other Ambulatory Visit: Payer: Self-pay | Admitting: Family Medicine

## 2022-03-07 ENCOUNTER — Ambulatory Visit: Payer: Medicare Other

## 2022-03-07 DIAGNOSIS — G4730 Sleep apnea, unspecified: Secondary | ICD-10-CM

## 2022-03-08 DIAGNOSIS — Z4789 Encounter for other orthopedic aftercare: Secondary | ICD-10-CM | POA: Diagnosis not present

## 2022-03-08 DIAGNOSIS — Z9889 Other specified postprocedural states: Secondary | ICD-10-CM | POA: Diagnosis not present

## 2022-03-08 DIAGNOSIS — M6740 Ganglion, unspecified site: Secondary | ICD-10-CM | POA: Diagnosis not present

## 2022-03-08 DIAGNOSIS — M1712 Unilateral primary osteoarthritis, left knee: Secondary | ICD-10-CM | POA: Diagnosis not present

## 2022-03-13 ENCOUNTER — Other Ambulatory Visit: Payer: Self-pay | Admitting: *Deleted

## 2022-03-13 DIAGNOSIS — Z125 Encounter for screening for malignant neoplasm of prostate: Secondary | ICD-10-CM | POA: Diagnosis not present

## 2022-03-13 DIAGNOSIS — N401 Enlarged prostate with lower urinary tract symptoms: Secondary | ICD-10-CM | POA: Diagnosis not present

## 2022-03-13 DIAGNOSIS — R350 Frequency of micturition: Secondary | ICD-10-CM | POA: Diagnosis not present

## 2022-03-13 DIAGNOSIS — R3912 Poor urinary stream: Secondary | ICD-10-CM | POA: Diagnosis not present

## 2022-03-13 DIAGNOSIS — R351 Nocturia: Secondary | ICD-10-CM | POA: Diagnosis not present

## 2022-03-13 DIAGNOSIS — I7123 Aneurysm of the descending thoracic aorta, without rupture: Secondary | ICD-10-CM

## 2022-03-13 LAB — PSA: PSA: 0.36

## 2022-03-14 ENCOUNTER — Ambulatory Visit: Payer: Medicare Other

## 2022-03-14 DIAGNOSIS — G4733 Obstructive sleep apnea (adult) (pediatric): Secondary | ICD-10-CM

## 2022-03-21 ENCOUNTER — Other Ambulatory Visit: Payer: Self-pay | Admitting: Family Medicine

## 2022-03-27 ENCOUNTER — Encounter: Payer: Self-pay | Admitting: Cardiology

## 2022-04-03 ENCOUNTER — Ambulatory Visit (HOSPITAL_BASED_OUTPATIENT_CLINIC_OR_DEPARTMENT_OTHER)
Admission: RE | Admit: 2022-04-03 | Discharge: 2022-04-03 | Disposition: A | Discharge status: Home / Self Care | Payer: Medicare Other | Source: Ambulatory Visit | Attending: Cardiology | Admitting: Cardiology

## 2022-04-03 ENCOUNTER — Encounter (HOSPITAL_BASED_OUTPATIENT_CLINIC_OR_DEPARTMENT_OTHER): Payer: Self-pay

## 2022-04-03 DIAGNOSIS — J9811 Atelectasis: Secondary | ICD-10-CM | POA: Diagnosis not present

## 2022-04-03 DIAGNOSIS — I7123 Aneurysm of the descending thoracic aorta, without rupture: Secondary | ICD-10-CM | POA: Insufficient documentation

## 2022-04-03 DIAGNOSIS — I7121 Aneurysm of the ascending aorta, without rupture: Secondary | ICD-10-CM | POA: Diagnosis not present

## 2022-04-03 MED ORDER — IOHEXOL 350 MG/ML SOLN
100.0000 mL | Freq: Once | INTRAVENOUS | Status: AC | PRN
  Administered 2022-04-03: 100 mL via INTRAVENOUS

## 2022-04-05 ENCOUNTER — Other Ambulatory Visit: Payer: Self-pay | Admitting: Family Medicine

## 2022-04-05 ENCOUNTER — Encounter: Payer: Self-pay | Admitting: Family Medicine

## 2022-04-05 ENCOUNTER — Telehealth: Payer: Self-pay | Admitting: Pulmonary Disease

## 2022-04-05 ENCOUNTER — Ambulatory Visit: Payer: Medicare Other | Admitting: Family Medicine

## 2022-04-05 VITALS — BP 122/78 | HR 75 | Ht 72.0 in | Wt 304.0 lb

## 2022-04-05 DIAGNOSIS — I7121 Aneurysm of the ascending aorta, without rupture: Secondary | ICD-10-CM | POA: Diagnosis not present

## 2022-04-05 DIAGNOSIS — G4733 Obstructive sleep apnea (adult) (pediatric): Secondary | ICD-10-CM | POA: Diagnosis not present

## 2022-04-05 DIAGNOSIS — Z125 Encounter for screening for malignant neoplasm of prostate: Secondary | ICD-10-CM | POA: Diagnosis not present

## 2022-04-05 DIAGNOSIS — I10 Essential (primary) hypertension: Secondary | ICD-10-CM

## 2022-04-05 MED ORDER — LOSARTAN POTASSIUM-HCTZ 50-12.5 MG PO TABS: 1.0000 | ORAL_TABLET | Freq: Every day | ORAL | 0 refills | Status: DC

## 2022-04-05 NOTE — Assessment & Plan Note (Signed)
By Dr. Stanford Breed.  Last scan September 2023.  Stable.  Follow-up 1 year with Dr. Stanford Breed.

## 2022-04-05 NOTE — Progress Notes (Signed)
Established Patient Office Visit  Subjective   Patient ID: Mark [name]., male    DOB: 27-Oct-1960  Age: 61 y.o. MRN: [id number]  Chief Complaint  Patient presents with   Hypertension    HPI   Hypertension- Pt denies chest pain, SOB, dizziness, or heart palpitations.  Taking meds as directed w/o problems.  Denies medication side effects.    Scheduled for left knee arthroplasty in October.  Follow-up ascending aortic aneurysm followed by Dr. Kirk Ruths in fact he just had a repeat CT done about 2 days ago that showed that it was stable at 4.3 cm.  FINDINGS: Cardiovascular: Grossly stable 4.3 cm ascending thoracic aortic aneurysm. No dissection is noted. Normal cardiac size. No pericardial effusion.  He also reports that his voice has been hoarse for about a week.  No pain.  He has had a little bit of increase in cough.  No fevers or chills.  He says he has had it in the past and usually responds well to steroids.  He also recently saw Dr. Link Snuffer at [facility] urology and had a checkup.    ROS    Objective:     BP 122/78   Pulse 75   Ht 6' (1.829 m)   Wt (!) 304 lb (137.9 kg)   SpO2 99%   BMI 41.23 kg/m    Physical Exam Constitutional:      Appearance: He is well-developed.  HENT:     Head: Normocephalic and atraumatic.     Right Ear: External ear normal.     Left Ear: External ear normal.     Nose: Nose normal.  Eyes:     Conjunctiva/sclera: Conjunctivae normal.     Pupils: Pupils are equal, round, and reactive to light.  Neck:     Thyroid: No thyromegaly.  Cardiovascular:     Rate and Rhythm: Normal rate and regular rhythm.     Heart sounds: Normal heart sounds.  Pulmonary:     Effort: Pulmonary effort is normal.     Breath sounds: Normal breath sounds.  Musculoskeletal:     Cervical back: Neck supple.  Lymphadenopathy:     Cervical: No cervical adenopathy.  Skin:    General: Skin is warm and dry.  Neurological:     Mental  Status: He is alert and oriented to person, place, and time.  Psychiatric:        Behavior: Behavior normal.      No results found for any visits on 04/05/22.    The 10-year ASCVD risk score (Arnett DK, et al., 2019) is: 6.5%    Assessment & Plan:   Problem List Items Addressed This Visit       Cardiovascular and Mediastinum   Essential hypertension - Primary    Well controlled. Continue current regimen. Follow up in  6 -9 months       Relevant Medications   losartan-hydrochlorothiazide (HYZAAR) 50-12.5 MG tablet   Ascending aortic aneurysm (HCC)    By Dr. Stanford Breed.  Last scan September 2023.  Stable.  Follow-up 1 year with Dr. Stanford Breed.      Relevant Medications   losartan-hydrochlorothiazide (HYZAAR) 50-12.5 MG tablet     Respiratory   OSA (obstructive sleep apnea)    He is still working on the CPAP.  He is actually planning on getting the stimulator placed.      Other Visit Diagnoses     Screening for prostate cancer  We will call to get recent notes from Dr. Gloriann Loan, his urologist.  Pharyngitis/voice change-exam is pretty normal and reassuring symptoms have been present for about 1 week but are mild.  If not improved after the weekend then please let me know he really would like to have a round of prednisone he says that works really well and has in the past.  But if he is not better by Monday then we will do a trial of a steroid burst.  He has already had a work-up in the past with the ENT.    Return in about 6 months (around 10/04/2022) for bp.    Beatrice Lecher, MD

## 2022-04-05 NOTE — Assessment & Plan Note (Signed)
Well controlled. Continue current regimen. Follow up in  6 -9 months

## 2022-04-05 NOTE — Assessment & Plan Note (Signed)
He is still working on the CPAP.  He is actually planning on getting the stimulator placed.

## 2022-04-05 NOTE — Telephone Encounter (Signed)
HST showed severe  OSA with AHI 29/ hr with his sleeping upright  Needs FU OV to reassess options

## 2022-04-06 LAB — LIPID PANEL W/REFLEX DIRECT LDL
Cholesterol: 124 mg/dL (ref <200)
HDL: 41 mg/dL (ref >=40)
LDL Cholesterol (Calc): 65 mg/dL (calc)
Non-HDL Cholesterol (Calc): 83 mg/dL (calc) (ref <130)
Total CHOL/HDL Ratio: 3 (calc) (ref <5.0)
Triglycerides: 94 mg/dL (ref <150)

## 2022-04-06 LAB — PSA: PSA: 0.5 ng/mL (ref <=4.00)

## 2022-04-08 ENCOUNTER — Telehealth: Payer: Self-pay | Admitting: *Deleted

## 2022-04-08 MED ORDER — PREDNISONE 20 MG PO TABS: 40.0000 mg | ORAL_TABLET | Freq: Every day | ORAL | 0 refills | Status: DC

## 2022-04-08 NOTE — Progress Notes (Signed)
Hi Allen, cholesterol looks great!  Prostate test is normal.

## 2022-04-08 NOTE — Telephone Encounter (Signed)
Called and spoke to patients wife Linus Orn about results. She voiced understanding. Scheduled a f/u ov for patient with [facility] NP for next week. Nothing further needed

## 2022-04-08 NOTE — Telephone Encounter (Signed)
Meds ordered this encounter  Medications   predniSONE (DELTASONE) 20 MG tablet    Sig: Take 2 tablets (40 mg total) by mouth daily with breakfast.    Dispense:  10 tablet    Refill:  0    

## 2022-04-08 NOTE — Telephone Encounter (Signed)
Pt called and stated that when he was seen by Dr. Madilyn Fireman last week he spoke to her about a cough that he was experiencing was told to  call back if this didn't get any better and she would send in prednisone for him to take.   Will send to pcp

## 2022-04-12 DIAGNOSIS — H02831 Dermatochalasis of right upper eyelid: Secondary | ICD-10-CM | POA: Diagnosis not present

## 2022-04-12 DIAGNOSIS — H02834 Dermatochalasis of left upper eyelid: Secondary | ICD-10-CM | POA: Diagnosis not present

## 2022-04-12 DIAGNOSIS — H25813 Combined forms of age-related cataract, bilateral: Secondary | ICD-10-CM | POA: Diagnosis not present

## 2022-04-12 DIAGNOSIS — H43393 Other vitreous opacities, bilateral: Secondary | ICD-10-CM | POA: Diagnosis not present

## 2022-04-15 ENCOUNTER — Encounter: Payer: Self-pay | Admitting: Nurse Practitioner

## 2022-04-15 ENCOUNTER — Ambulatory Visit: Payer: Medicare Other | Admitting: Nurse Practitioner

## 2022-04-15 VITALS — BP 120/72 | HR 75 | Temp 98.2°F | Ht 72.0 in | Wt 301.0 lb

## 2022-04-15 DIAGNOSIS — G4733 Obstructive sleep apnea (adult) (pediatric): Secondary | ICD-10-CM

## 2022-04-15 DIAGNOSIS — J387 Other diseases of larynx: Secondary | ICD-10-CM

## 2022-04-15 DIAGNOSIS — K219 Gastro-esophageal reflux disease without esophagitis: Secondary | ICD-10-CM | POA: Diagnosis not present

## 2022-04-15 MED ORDER — FAMOTIDINE 20 MG PO TABS: 20.0000 mg | ORAL_TABLET | Freq: Two times a day (BID) | ORAL | 2 refills | Status: DC

## 2022-04-15 NOTE — Assessment & Plan Note (Signed)
Persistent voice hoarseness. Previous PFTs unremarkable. He does not have any significant nasal symptoms. Doesn't feel as though he has much reflux but symptoms are worse at night, especially laying flat, and throat is irritated in the AM. Advised he trial pepcid. We will see how he does with this and restarting CPAP. Educated on measures to avoid throat irritation. May need further eval with ENT.

## 2022-04-15 NOTE — Patient Instructions (Addendum)
Start CPAP auto 8-16 cmH2O every night, minimum of 4-6 hours a night.  Change equipment every 30 days or as directed by DME. Wash your tubing with warm soap and water daily, hang to dry. Wash humidifier portion weekly.  Be aware of reduced alertness and do not drive or operate heavy machinery if experiencing this or drowsiness.  Healthy weight management discussed.  Avoid or decrease alcohol consumption and medications that make you more sleepy, if possible. Notify if persistent daytime sleepiness occurs even with consistent use of CPAP.  We discussed how untreated sleep apnea puts an individual at risk for cardiac arrhthymias, pulm HTN, DM, stroke and increases their risk for daytime accidents.   Follow up with Dr. Wilburn Cornelia with [facility] in the spring and work on weight loss in the interim.  Famotidine 20 mg Twice daily for reflux Avoid throat clearing. Use sugar free hard candies or non-menthol cough drops during this time to soothe your throat.  Warm tea with honey and lemon.   Follow up in 6-8 weeks with Dr. Elsworth Soho or Joellen Jersey Keilah Lemire,NP to see how things are going with your new CPAP. If symptoms do not improve or worsen, please contact office for sooner follow up

## 2022-04-15 NOTE — Assessment & Plan Note (Signed)
Encouraged on healthy weight loss measures.

## 2022-04-15 NOTE — Progress Notes (Signed)
$'@Patient'Q$  ID: Mark [name]., male    DOB: Mar 31, 1961, 61 y.o.   MRN: [id number]  Chief Complaint  Patient presents with   Follow-up    Referring provider: Hali Marry, *  HPI: 61 year old male, never smoker followed for severe OSA. He is a patient of Dr. Bari Mantis and last seen in office on 12/19/2021. Past medical history significant for HTN, AAA, MS, osteoarthritis, BPH, depression, obesity.  TEST/EVENTS:  06/2012 NPSG: AHI 98/h, corrected by CPAP 11 cmH2O. 358 lb 12/2018 PFT: ratio 81, FVC 52, TLC 44, DLCO 84 03/2021 CT chest: clear lungs 10/2021 NPSG: AHI 34/h, SpO2 low 62%; 2 lpm O2 added 02/04/2022 CPAP titration: optimal PAP pressure 15 cmH2O with residual AHI 2.3/h 04/05/2022 HST: sleeping upright AHI 29/h  12/19/2021: OV with Dr. Elsworth Soho. Previously placed on Breo due to chronic cough and recurrent pneumonia. PFTs without airway obstruction; did have some restriction suspected to be r/t weight. Stopped Breo. He had recent sleep study with improvement in OSA but still considered severe 32/hr. Still struggling with CPAP compliance. Discussed various treatment options. Dental appliance suboptimal d/t severity. BMI too high for hypoglossal nerve but plan for referral to ENT while he works on weight loss measures. Offered more CPAP desensitization; he will continue to try at home. He does not notice snoring when he sleeps upright; plan for HST with him sleeping in upright position.   04/15/2022: Today - follow up Patient presents today for follow up to discuss recent sleep study results.  He had a sleep study in April 2023 which showed severe sleep apnea with AHI 34.  He underwent CPAP titration February 04, 2022 due to issues with tolerating CPAP.  Was found to be well controlled on 15 cmH2O with residual AHI 2.3.  He then underwent home sleep study, not on CPAP and while sleeping in his recliner, which still showed severe sleep apnea with AHI 29.  He has a longstanding history of  OSA.  He has a CPAP machine at home but has had trouble tolerating it.  Reports that even when he has used in the past he has not noticed a huge difference in the morning.  The longest stretch of time that he used it was nightly for a week.  He does continue to snore at night and his wife has told him that he stops breathing. He saw ENT in August for evaluation of inspire device.  He was recommended to work on weight loss measures and follow-up in 6 months.  He has lost around 60 pounds.  Currently having trouble with activity due to left knee pain.  He is supposed to be undergoing knee replacement surgery in the next month.  His CPAP machine is quite old.  He was going to take it to be recalibrated to see if he could start using it again.  He denies any extreme daytime fatigue symptoms or drowsy driving. He does worry that his snoring has caused him to have throat irritation. He's struggled with a hoarse voice for a while. He denies any nasal congestion or postnasal drip. He doesn't have any reflux symptoms that he notices. No cough or increased SOB.   No Known Allergies  Immunization History  Administered Date(s) Administered   Tdap 01/04/2015    Past Medical History:  Diagnosis Date   Anxiety    chronic pain treated with narcotics   Arthritis    Chronic pain    Constipation due to opioid therapy  COPD (chronic obstructive pulmonary disease) (HCC)    Depression    Dysphonia    thoracic left sided"impingement" with heminumbness   Dyspnea    GI bleed    abt 15 years ago   Hypertension    Morbid obesity ([place])    MS (multiple sclerosis) ([place]) 02/11/2013   [name]   Neuromuscular disorder [facility]) 2014   Pneumonia    Sleep apnea    Sleep apnea with use of continuous positive airway pressure (CPAP) 02/11/2013   07-13-12 AHi of 98.6 /hr titrated to 11 cm water ,  average user time 4 hours and 4 minutes. Residual AHI 1.20 December 2012 .   Umbilical hernia     Tobacco  History: Social History   Tobacco Use  Smoking Status Never  Smokeless Tobacco Never   Counseling given: Not Answered   Outpatient Medications Prior to Visit  Medication Sig Dispense Refill   cyclobenzaprine (FLEXERIL) 10 MG tablet Take 1 tablet (10 mg total) by mouth 3 (three) times daily as needed for muscle spasms. 30 tablet 0   DULoxetine (CYMBALTA) 60 MG capsule Take 60 mg by mouth 2 (two) times daily.      furosemide (LASIX) 20 MG tablet Take 1 tablet (20 mg total) by mouth daily as needed. 20 tablet 3   gabapentin (NEURONTIN) 800 MG tablet Take 800 mg by mouth 4 (four) times daily.  11   HYDROcodone-acetaminophen (NORCO) 10-325 MG tablet Take 2 tablets by mouth every 4 (four) hours as needed for severe pain. 40 tablet 0   ibuprofen (ADVIL,MOTRIN) 800 MG tablet Take 800 mg by mouth every 8 (eight) hours as needed for moderate pain.     losartan-hydrochlorothiazide (HYZAAR) 50-12.5 MG tablet Take 1 tablet by mouth daily. NO REFILLS. NEEDS AN APPT FOR BP CHECK 30 tablet 0   morphine (MS CONTIN) 30 MG 12 hr tablet Take 1 tablet by mouth every 12 (twelve) hours.     tamsulosin (FLOMAX) 0.4 MG CAPS capsule Take 0.4 mg by mouth daily.     Teriflunomide 14 MG TABS Take 14 mg by mouth at bedtime.      tiZANidine (ZANAFLEX) 4 MG tablet Take 4 mg by mouth every 6 (six) hours as needed for muscle spasms.      tiZANidine (ZANAFLEX) 4 MG tablet Take by mouth.     amantadine (SYMMETREL) 100 MG capsule Take 100 mg by mouth 3 (three) times daily.      predniSONE (DELTASONE) 20 MG tablet Take 2 tablets (40 mg total) by mouth daily with breakfast. 10 tablet 0   No facility-administered medications prior to visit.     Review of Systems:   Constitutional: No weight loss or gain, night sweats, fevers, chills, or lassitude. +mild fatigue  HEENT: No headaches, difficulty swallowing, tooth/dental problems, or sore throat. No sneezing, itching, ear ache, nasal congestion, or post nasal drip. +voice  hoarseness; occasional throat irritation (worse in AM) CV:  No chest pain, orthopnea, PND, swelling in lower extremities, anasarca, dizziness, palpitations, syncope Resp: +snoring, shortness of breath with strenuous exertion (baseline). No excess mucus or change in color of mucus. No productive or non-productive. No hemoptysis. No wheezing.  No chest wall deformity GI:  No heartburn, indigestion, abdominal pain, nausea, vomiting, diarrhea, change in bowel habits, loss of appetite, bloody stools.  MSK:  +left knee pain.  No decreased range of motion.  No back pain. Neuro: No dizziness or lightheadedness.  Psych: No depression or anxiety. Mood  stable.     Physical Exam:  BP 120/72 (BP Location: Right Arm, Cuff Size: Normal)   Pulse 75   Temp 98.2 F (36.8 C)   Ht 6' (1.829 m)   Wt (!) 301 lb (136.5 kg)   SpO2 96%   BMI 40.82 kg/m   GEN: Pleasant, interactive, well-appearing; morbidly obese; in no acute distress. HEENT:  Normocephalic and atraumatic. PERRLA. Sclera white. Nasal turbinates pink, moist and patent bilaterally. No rhinorrhea present. Oropharynx erythematous and moist, without exudate or edema. No lesions, ulcerations NECK:  Supple w/ fair ROM. No JVD present. Normal carotid impulses w/o bruits. Thyroid symmetrical with no goiter or nodules palpated. No lymphadenopathy.   CV: RRR, no m/r/g, no peripheral edema. Pulses intact, +2 bilaterally. No cyanosis, pallor or clubbing. PULMONARY:  Unlabored, regular breathing. Clear bilaterally A&P w/o wheezes/rales/rhonchi. No accessory muscle use. No dullness to percussion. GI: BS present and normoactive. Soft, non-tender to palpation. No organomegaly or masses detected.  MSK: No erythema, warmth or tenderness. No deformities or joint swelling noted.  Neuro: A/Ox3. No focal deficits noted.   Skin: Warm, no lesions or rashe Psych: Normal affect and behavior. Judgement and thought content appropriate.     Lab Results:  CBC     Component Value Date/Time   WBC 7.9 06/12/2021 0000   RBC 4.61 06/12/2021 0000   HGB 13.4 06/12/2021 0000   HCT 39.8 06/12/2021 0000   PLT 248 06/12/2021 0000   MCV 86.3 06/12/2021 0000   MCH 29.1 06/12/2021 0000   MCHC 33.7 06/12/2021 0000   RDW 12.5 06/12/2021 0000   LYMPHSABS 1,675 06/12/2021 0000   MONOABS 0.1 08/14/2007 0929   EOSABS 253 06/12/2021 0000   BASOSABS 47 06/12/2021 0000    BMET    Component Value Date/Time   NA 139 06/26/2021 0000   NA 141 07/21/2019 1215   K 3.9 06/26/2021 0000   CL 98 06/26/2021 0000   CO2 36 (H) 06/26/2021 0000   GLUCOSE 103 (H) 06/26/2021 0000   BUN 15 06/26/2021 0000   BUN 19 07/21/2019 1215   CREATININE 0.82 06/26/2021 0000   CALCIUM 9.5 06/26/2021 0000   GFRNONAA 96 12/21/2019 1531   GFRAA 112 12/21/2019 1531    BNP    Component Value Date/Time   BNP 17 06/12/2021 0000     Imaging:  CT ANGIO CHEST AORTA W &/OR WO CONTRAST  Result Date: 04/03/2022 CLINICAL DATA:  Thoracic aortic aneurysm. EXAM: CT ANGIOGRAPHY CHEST WITH CONTRAST TECHNIQUE: Multidetector CT imaging of the chest was performed using the standard protocol during bolus administration of intravenous contrast. Multiplanar CT image reconstructions and MIPs were obtained to evaluate the vascular anatomy. RADIATION DOSE REDUCTION: This exam was performed according to the departmental dose-optimization program which includes automated exposure control, adjustment of the mA and/or kV according to patient size and/or use of iterative reconstruction technique. CONTRAST:  138m OMNIPAQUE IOHEXOL 350 MG/ML SOLN COMPARISON:  April 02, 2021. FINDINGS: Cardiovascular: Grossly stable 4.3 cm ascending thoracic aortic aneurysm. No dissection is noted. Normal cardiac size. No pericardial effusion. Mediastinum/Nodes: No enlarged mediastinal, hilar, or axillary lymph nodes. Thyroid gland, trachea, and esophagus demonstrate no significant findings. Lungs/Pleura: No pneumothorax or  pleural effusion is noted. Elevated right hemidiaphragm is noted with mild right basilar subsegmental atelectasis. Upper Abdomen: No acute abnormality. Musculoskeletal: No chest wall abnormality. No acute or significant osseous findings. Review of the MIP images confirms the above findings. IMPRESSION: Grossly stable 4.3 cm ascending thoracic aortic aneurysm. Recommend annual  imaging followup by CTA or MRA. This recommendation follows 2010 ACCF/AHA/AATS/ACR/ASA/SCA/SCAI/SIR/STS/SVM Guidelines for the Diagnosis and Management of Patients with Thoracic Aortic Disease. Circulation. 2010; 121: H476-L465. Aortic aneurysm NOS (ICD10-I71.9). Electronically Signed   By: Marijo Conception M.D.   On: 04/03/2022 15:07          No data to display          No results found for: "NITRICOXIDE"      Assessment & Plan:   OSA (obstructive sleep apnea) Severe OSA. He has been intolerant of CPAP in the past; longest length of time that he's worn it has been a week. He was evaluated by ENT but BMI currently disqualifies him for [facility]; plan to work on weight loss after knee surgery and f/u up with them in 6 months. We had a very lengthy discussion regarding the risks of untreated sleep apnea. He was agreeable to retrying CPAP therapy. His machine is quite old; we will send for a new one so we can ensure proper settings and assess compliance/control. Cautioned to be aware of drowsy driving and avoid driving if sleepy.  Patient Instructions  Start CPAP auto 8-16 cmH2O every night, minimum of 4-6 hours a night.  Change equipment every 30 days or as directed by DME. Wash your tubing with warm soap and water daily, hang to dry. Wash humidifier portion weekly.  Be aware of reduced alertness and do not drive or operate heavy machinery if experiencing this or drowsiness.  Healthy weight management discussed.  Avoid or decrease alcohol consumption and medications that make you more sleepy, if possible. Notify if  persistent daytime sleepiness occurs even with consistent use of CPAP.  We discussed how untreated sleep apnea puts an individual at risk for cardiac arrhthymias, pulm HTN, DM, stroke and increases their risk for daytime accidents.   Follow up with Dr. Wilburn Cornelia with [facility] in the spring and work on weight loss in the interim.  Famotidine 20 mg Twice daily for reflux Avoid throat clearing. Use sugar free hard candies or non-menthol cough drops during this time to soothe your throat.  Warm tea with honey and lemon.   Follow up in 6-8 weeks with Dr. Elsworth Soho or Joellen Jersey Marlaysia Lenig,NP to see how things are going with your new CPAP. If symptoms do not improve or worsen, please contact office for sooner follow up     Irritable larynx Persistent voice hoarseness. Previous PFTs unremarkable. He does not have any significant nasal symptoms. Doesn't feel as though he has much reflux but symptoms are worse at night, especially laying flat, and throat is irritated in the AM. Advised he trial pepcid. We will see how he does with this and restarting CPAP. Educated on measures to avoid throat irritation. May need further eval with ENT.   Morbid obesity ([place]) Encouraged on healthy weight loss measures.    I spent 35 minutes of dedicated to the care of this patient on the date of this encounter to include pre-visit review of records, face-to-face time with the patient discussing conditions above, post visit ordering of testing, clinical documentation with the electronic health record, making appropriate referrals as documented, and communicating necessary findings to members of the patients care team.  Clayton Bibles, NP 04/15/2022  Pt aware and understands NP's role.

## 2022-04-15 NOTE — Assessment & Plan Note (Signed)
Severe OSA. He has been intolerant of CPAP in the past; longest length of time that he's worn it has been a week. He was evaluated by ENT but BMI currently disqualifies him for [facility]; plan to work on weight loss after knee surgery and f/u up with them in 6 months. We had a very lengthy discussion regarding the risks of untreated sleep apnea. He was agreeable to retrying CPAP therapy. His machine is quite old; we will send for a new one so we can ensure proper settings and assess compliance/control. Cautioned to be aware of drowsy driving and avoid driving if sleepy.  Patient Instructions  Start CPAP auto 8-16 cmH2O every night, minimum of 4-6 hours a night.  Change equipment every 30 days or as directed by DME. Wash your tubing with warm soap and water daily, hang to dry. Wash humidifier portion weekly.  Be aware of reduced alertness and do not drive or operate heavy machinery if experiencing this or drowsiness.  Healthy weight management discussed.  Avoid or decrease alcohol consumption and medications that make you more sleepy, if possible. Notify if persistent daytime sleepiness occurs even with consistent use of CPAP.  We discussed how untreated sleep apnea puts an individual at risk for cardiac arrhthymias, pulm HTN, DM, stroke and increases their risk for daytime accidents.   Follow up with Dr. Wilburn Cornelia with [facility] in the spring and work on weight loss in the interim.  Famotidine 20 mg Twice daily for reflux Avoid throat clearing. Use sugar free hard candies or non-menthol cough drops during this time to soothe your throat.  Warm tea with honey and lemon.   Follow up in 6-8 weeks with Dr. Elsworth Soho or Joellen Jersey Teana Lindahl,NP to see how things are going with your new CPAP. If symptoms do not improve or worsen, please contact office for sooner follow up

## 2022-04-18 DIAGNOSIS — G35 Multiple sclerosis: Secondary | ICD-10-CM | POA: Diagnosis not present

## 2022-04-18 DIAGNOSIS — M1712 Unilateral primary osteoarthritis, left knee: Secondary | ICD-10-CM | POA: Diagnosis not present

## 2022-04-18 DIAGNOSIS — M25762 Osteophyte, left knee: Secondary | ICD-10-CM | POA: Diagnosis not present

## 2022-04-18 DIAGNOSIS — M25462 Effusion, left knee: Secondary | ICD-10-CM | POA: Diagnosis not present

## 2022-04-18 DIAGNOSIS — Z01818 Encounter for other preprocedural examination: Secondary | ICD-10-CM | POA: Diagnosis not present

## 2022-04-18 DIAGNOSIS — G8381 Brown-Sequard syndrome: Secondary | ICD-10-CM | POA: Diagnosis not present

## 2022-04-23 DIAGNOSIS — M1712 Unilateral primary osteoarthritis, left knee: Secondary | ICD-10-CM | POA: Diagnosis not present

## 2022-04-23 DIAGNOSIS — Z01818 Encounter for other preprocedural examination: Secondary | ICD-10-CM | POA: Diagnosis not present

## 2022-04-25 NOTE — Progress Notes (Deleted)
HPI: FU hypertension and TAA. Echocardiogram December 2020 showed normal LV function and moderately dilated ascending aorta measuring 49 mm. CTA September 2023 showed ascending thoracic aortic aneurysm measuring 4.3 cm. Since last seen,   Current Outpatient Medications  Medication Sig Dispense Refill   amantadine (SYMMETREL) 100 MG capsule Take 100 mg by mouth 3 (three) times daily.      cyclobenzaprine (FLEXERIL) 10 MG tablet Take 1 tablet (10 mg total) by mouth 3 (three) times daily as needed for muscle spasms. 30 tablet 0   DULoxetine (CYMBALTA) 60 MG capsule Take 60 mg by mouth 2 (two) times daily.      famotidine (PEPCID) 20 MG tablet Take 1 tablet (20 mg total) by mouth 2 (two) times daily. 60 tablet 2   furosemide (LASIX) 20 MG tablet Take 1 tablet (20 mg total) by mouth daily as needed. 20 tablet 3   gabapentin (NEURONTIN) 800 MG tablet Take 800 mg by mouth 4 (four) times daily.  11   HYDROcodone-acetaminophen (NORCO) 10-325 MG tablet Take 2 tablets by mouth every 4 (four) hours as needed for severe pain. 40 tablet 0   ibuprofen (ADVIL,MOTRIN) 800 MG tablet Take 800 mg by mouth every 8 (eight) hours as needed for moderate pain.     losartan-hydrochlorothiazide (HYZAAR) 50-12.5 MG tablet Take 1 tablet by mouth daily. NO REFILLS. NEEDS AN APPT FOR BP CHECK 30 tablet 0   morphine (MS CONTIN) 30 MG 12 hr tablet Take 1 tablet by mouth every 12 (twelve) hours.     tamsulosin (FLOMAX) 0.4 MG CAPS capsule Take 0.4 mg by mouth daily.     Teriflunomide 14 MG TABS Take 14 mg by mouth at bedtime.      tiZANidine (ZANAFLEX) 4 MG tablet Take 4 mg by mouth every 6 (six) hours as needed for muscle spasms.      tiZANidine (ZANAFLEX) 4 MG tablet Take by mouth.     No current facility-administered medications for this visit.     Past Medical History:  Diagnosis Date   Anxiety    chronic pain treated with narcotics   Arthritis    Chronic pain    Constipation due to opioid therapy    COPD  (chronic obstructive pulmonary disease) (HCC)    Depression    Dysphonia    thoracic left sided"impingement" with heminumbness   Dyspnea    GI bleed    abt 15 years ago   Hypertension    Morbid obesity ([place])    MS (multiple sclerosis) ([place]) 02/11/2013   [name]   Neuromuscular disorder [facility]) 2014   Pneumonia    Sleep apnea    Sleep apnea with use of continuous positive airway pressure (CPAP) 02/11/2013   07-13-12 AHi of 98.6 /hr titrated to 11 cm water ,  average user time 4 hours and 4 minutes. Residual AHI 1.20 December 2012 .   Umbilical hernia     Past Surgical History:  Procedure Laterality Date   ANTERIOR CERVICAL DECOMP/DISCECTOMY FUSION N/A 09/29/2019   Procedure: Anterior Cervical Decompression Fusion - Cervical Four-Cervical Five;  Surgeon: [name], MD;  Location: [place];  Service: Neurosurgery;  Laterality: N/A;  Anterior Cervical Decompression Fusion - Cervical Four-Cervical Five    CARPAL TUNNEL RELEASE Right    [name] TUNNEL RELEASE Right 09/29/2019   Procedure: Carpal Tunnel Release;  Surgeon: [name], MD;  Location: [place];  Service: Neurosurgery;  Laterality: Right;  COLONOSCOPY W/ POLYPECTOMY     ESOPHAGOGASTRODUODENOSCOPY     MULTIPLE TOOTH EXTRACTIONS     POSTERIOR LUMBAR FUSION  04/17/2015   [name], also removed old hardware   SPINE SURGERY     total of six vertebras fused, three lumbar surgeries and one anterior neck fusion   TONSILLECTOMY      Social History   Socioeconomic History   Marital status: Married    Spouse name: [name]   Number of children: 2   Years of education: 12+   Highest education level: Some college, no degree  Occupational History   Occupation: Equities trader shop    Comment: disablity  Tobacco Use   Smoking status: Never   Smokeless tobacco: Never  Vaping Use   Vaping Use: Never used  Substance and Sexual Activity   Alcohol use: Not Currently    Comment: occasionally   Drug use: No   Sexual  activity: Not Currently  Other Topics Concern   Not on file  Social History Narrative   Lives with his wife. They have two children. He enjoys playing video games.      Social Determinants of Health   Financial Resource Strain: Low Risk  (08/17/2021)   Overall Financial Resource Strain (CARDIA)    Difficulty of Paying Living Expenses: Not hard at all  Food Insecurity: No Food Insecurity (08/17/2021)   Hunger Vital Sign    Worried About Running Out of Food in the Last Year: Never true    Ran Out of Food in the Last Year: Never true  Transportation Needs: No Transportation Needs (08/17/2021)   PRAPARE - Hydrologist (Medical): No    Lack of Transportation (Non-Medical): No  Physical Activity: Inactive (08/17/2021)   Exercise Vital Sign    Days of Exercise per Week: 0 days    Minutes of Exercise per Session: 0 min  Stress: No Stress Concern Present (08/17/2021)   [place]    Feeling of Stress : Not at all  Social Connections: Moderately Integrated (08/17/2021)   Social Connection and Isolation Panel [NHANES]    Frequency of Communication with Friends and Family: More than three times a week    Frequency of Social Gatherings with Friends and Family: Never    Attends Religious Services: 1 to 4 times per year    Active Member of [employer] or Organizations: No    Attends Archivist Meetings: Never    Marital Status: Married  Human resources officer Violence: Not At Risk (08/17/2021)   Humiliation, Afraid, Rape, and Kick questionnaire    Fear of Current or Ex-Partner: No    Emotionally Abused: No    Physically Abused: No    Sexually Abused: No    Family History  Problem Relation Age of Onset   Arrhythmia Mother    Cancer Mother    Cancer Other    Cancer Other    Sleep walking Son    Obesity Sister     ROS: no fevers or chills, productive cough, hemoptysis, dysphasia, odynophagia,  melena, hematochezia, dysuria, hematuria, rash, seizure activity, orthopnea, PND, pedal edema, claudication. Remaining systems are negative.  Physical Exam: Well-developed well-nourished in no acute distress.  Skin is warm and dry.  HEENT is normal.  Neck is supple.  Chest is clear to auscultation with normal expansion.  Cardiovascular exam is regular rate and rhythm.  Abdominal exam nontender or distended. No masses palpated. Extremities show no  edema. neuro grossly intact  ECG- personally reviewed  A/P  1 thoracic aortic aneurysm-unchanged in size on the recent CTA.  Plan follow-up study September 2024.  2 hypertension-patient's blood pressure is controlled.  Continue present medical regimen.  3 obstructive sleep apnea-continue CPAP.  4 obesity-we discussed diet, exercise and weight loss.  Kirk Ruths, MD

## 2022-04-29 DIAGNOSIS — Z1211 Encounter for screening for malignant neoplasm of colon: Secondary | ICD-10-CM | POA: Diagnosis not present

## 2022-04-29 DIAGNOSIS — Z86010 Personal history of colonic polyps: Secondary | ICD-10-CM | POA: Diagnosis not present

## 2022-05-06 ENCOUNTER — Other Ambulatory Visit: Payer: Self-pay | Admitting: Family Medicine

## 2022-05-06 ENCOUNTER — Telehealth: Payer: Self-pay | Admitting: Cardiology

## 2022-05-06 DIAGNOSIS — R051 Acute cough: Secondary | ICD-10-CM | POA: Diagnosis not present

## 2022-05-06 DIAGNOSIS — R062 Wheezing: Secondary | ICD-10-CM | POA: Diagnosis not present

## 2022-05-06 NOTE — Telephone Encounter (Signed)
Spoke to patient's wife she stated husband had yearly f/u scheduled with Dr.Crenshaw 10/18 which had to be cancelled.Stated she wanted to know if he needs a follow up appointment.Advised he has not been seen in 1 year and he will need appointment.Stated he is having a knee replacement 11/2.Follow up appointment scheduled with Dr.Crenshaw 12/7 at 1:40 pm.

## 2022-05-06 NOTE — Telephone Encounter (Signed)
Patient's wife states they saw the patient's CT results and is requesting an order for next year be put in so that they will be able to schedule it when he is due again.

## 2022-05-08 ENCOUNTER — Ambulatory Visit: Payer: Medicare Other | Admitting: Cardiology

## 2022-05-09 DIAGNOSIS — M23304 Other meniscus derangements, unspecified medial meniscus, left knee: Secondary | ICD-10-CM | POA: Diagnosis not present

## 2022-05-09 DIAGNOSIS — Z01818 Encounter for other preprocedural examination: Secondary | ICD-10-CM | POA: Diagnosis not present

## 2022-05-09 DIAGNOSIS — M1712 Unilateral primary osteoarthritis, left knee: Secondary | ICD-10-CM | POA: Diagnosis not present

## 2022-05-09 DIAGNOSIS — M23004 Cystic meniscus, unspecified medial meniscus, left knee: Secondary | ICD-10-CM | POA: Diagnosis not present

## 2022-05-10 DIAGNOSIS — M1712 Unilateral primary osteoarthritis, left knee: Secondary | ICD-10-CM | POA: Diagnosis not present

## 2022-05-10 DIAGNOSIS — M7989 Other specified soft tissue disorders: Secondary | ICD-10-CM | POA: Diagnosis not present

## 2022-05-10 DIAGNOSIS — M6740 Ganglion, unspecified site: Secondary | ICD-10-CM | POA: Diagnosis not present

## 2022-05-10 DIAGNOSIS — Z969 Presence of functional implant, unspecified: Secondary | ICD-10-CM | POA: Diagnosis not present

## 2022-05-21 DIAGNOSIS — M546 Pain in thoracic spine: Secondary | ICD-10-CM | POA: Diagnosis not present

## 2022-05-21 DIAGNOSIS — M542 Cervicalgia: Secondary | ICD-10-CM | POA: Diagnosis not present

## 2022-05-21 DIAGNOSIS — F1120 Opioid dependence, uncomplicated: Secondary | ICD-10-CM | POA: Diagnosis not present

## 2022-05-21 DIAGNOSIS — M5440 Lumbago with sciatica, unspecified side: Secondary | ICD-10-CM | POA: Diagnosis not present

## 2022-05-23 DIAGNOSIS — M76892 Other specified enthesopathies of left lower limb, excluding foot: Secondary | ICD-10-CM | POA: Diagnosis not present

## 2022-05-23 DIAGNOSIS — G35 Multiple sclerosis: Secondary | ICD-10-CM | POA: Diagnosis not present

## 2022-05-23 DIAGNOSIS — Z79899 Other long term (current) drug therapy: Secondary | ICD-10-CM | POA: Diagnosis not present

## 2022-05-23 DIAGNOSIS — G4733 Obstructive sleep apnea (adult) (pediatric): Secondary | ICD-10-CM | POA: Diagnosis not present

## 2022-05-23 DIAGNOSIS — G629 Polyneuropathy, unspecified: Secondary | ICD-10-CM | POA: Diagnosis not present

## 2022-05-23 DIAGNOSIS — G894 Chronic pain syndrome: Secondary | ICD-10-CM | POA: Diagnosis not present

## 2022-05-23 DIAGNOSIS — I10 Essential (primary) hypertension: Secondary | ICD-10-CM | POA: Diagnosis not present

## 2022-05-23 DIAGNOSIS — R2689 Other abnormalities of gait and mobility: Secondary | ICD-10-CM | POA: Diagnosis not present

## 2022-05-23 DIAGNOSIS — G4730 Sleep apnea, unspecified: Secondary | ICD-10-CM | POA: Diagnosis not present

## 2022-05-23 DIAGNOSIS — M5412 Radiculopathy, cervical region: Secondary | ICD-10-CM | POA: Diagnosis not present

## 2022-05-23 DIAGNOSIS — M1712 Unilateral primary osteoarthritis, left knee: Secondary | ICD-10-CM | POA: Diagnosis not present

## 2022-05-23 DIAGNOSIS — J449 Chronic obstructive pulmonary disease, unspecified: Secondary | ICD-10-CM | POA: Diagnosis not present

## 2022-05-23 DIAGNOSIS — M25862 Other specified joint disorders, left knee: Secondary | ICD-10-CM | POA: Diagnosis not present

## 2022-05-23 DIAGNOSIS — N400 Enlarged prostate without lower urinary tract symptoms: Secondary | ICD-10-CM | POA: Diagnosis not present

## 2022-05-23 DIAGNOSIS — Z472 Encounter for removal of internal fixation device: Secondary | ICD-10-CM | POA: Diagnosis not present

## 2022-05-23 DIAGNOSIS — Z9181 History of falling: Secondary | ICD-10-CM | POA: Diagnosis not present

## 2022-05-23 DIAGNOSIS — M549 Dorsalgia, unspecified: Secondary | ICD-10-CM | POA: Diagnosis not present

## 2022-05-23 DIAGNOSIS — M799 Soft tissue disorder, unspecified: Secondary | ICD-10-CM | POA: Diagnosis not present

## 2022-05-23 DIAGNOSIS — K219 Gastro-esophageal reflux disease without esophagitis: Secondary | ICD-10-CM | POA: Diagnosis not present

## 2022-05-23 DIAGNOSIS — M25861 Other specified joint disorders, right knee: Secondary | ICD-10-CM | POA: Diagnosis not present

## 2022-05-24 DIAGNOSIS — Z79899 Other long term (current) drug therapy: Secondary | ICD-10-CM | POA: Diagnosis not present

## 2022-05-24 DIAGNOSIS — M1712 Unilateral primary osteoarthritis, left knee: Secondary | ICD-10-CM | POA: Diagnosis not present

## 2022-05-24 DIAGNOSIS — G629 Polyneuropathy, unspecified: Secondary | ICD-10-CM | POA: Diagnosis not present

## 2022-05-24 DIAGNOSIS — M76892 Other specified enthesopathies of left lower limb, excluding foot: Secondary | ICD-10-CM | POA: Diagnosis not present

## 2022-05-24 DIAGNOSIS — M5412 Radiculopathy, cervical region: Secondary | ICD-10-CM | POA: Diagnosis not present

## 2022-05-24 DIAGNOSIS — Z472 Encounter for removal of internal fixation device: Secondary | ICD-10-CM | POA: Diagnosis not present

## 2022-05-24 DIAGNOSIS — G35 Multiple sclerosis: Secondary | ICD-10-CM | POA: Diagnosis not present

## 2022-05-24 DIAGNOSIS — G4733 Obstructive sleep apnea (adult) (pediatric): Secondary | ICD-10-CM | POA: Diagnosis not present

## 2022-05-24 DIAGNOSIS — M549 Dorsalgia, unspecified: Secondary | ICD-10-CM | POA: Diagnosis not present

## 2022-05-24 DIAGNOSIS — Z9181 History of falling: Secondary | ICD-10-CM | POA: Diagnosis not present

## 2022-05-24 DIAGNOSIS — R2689 Other abnormalities of gait and mobility: Secondary | ICD-10-CM | POA: Diagnosis not present

## 2022-05-24 DIAGNOSIS — I10 Essential (primary) hypertension: Secondary | ICD-10-CM | POA: Diagnosis not present

## 2022-05-24 DIAGNOSIS — J449 Chronic obstructive pulmonary disease, unspecified: Secondary | ICD-10-CM | POA: Diagnosis not present

## 2022-05-24 DIAGNOSIS — M799 Soft tissue disorder, unspecified: Secondary | ICD-10-CM | POA: Diagnosis not present

## 2022-05-24 DIAGNOSIS — G894 Chronic pain syndrome: Secondary | ICD-10-CM | POA: Diagnosis not present

## 2022-05-24 DIAGNOSIS — T84623A Infection and inflammatory reaction due to internal fixation device of left tibia, initial encounter: Secondary | ICD-10-CM | POA: Diagnosis not present

## 2022-05-24 DIAGNOSIS — T84093A Other mechanical complication of internal left knee prosthesis, initial encounter: Secondary | ICD-10-CM | POA: Diagnosis not present

## 2022-05-24 DIAGNOSIS — M25862 Other specified joint disorders, left knee: Secondary | ICD-10-CM | POA: Diagnosis not present

## 2022-05-24 DIAGNOSIS — K219 Gastro-esophageal reflux disease without esophagitis: Secondary | ICD-10-CM | POA: Diagnosis not present

## 2022-05-27 ENCOUNTER — Ambulatory Visit: Payer: Medicare Other | Admitting: Nurse Practitioner

## 2022-06-06 ENCOUNTER — Ambulatory Visit: Payer: Medicare Other | Admitting: Nurse Practitioner

## 2022-06-07 ENCOUNTER — Other Ambulatory Visit: Payer: Self-pay | Admitting: Family Medicine

## 2022-06-12 DIAGNOSIS — M1712 Unilateral primary osteoarthritis, left knee: Secondary | ICD-10-CM | POA: Diagnosis not present

## 2022-06-12 DIAGNOSIS — Z4789 Encounter for other orthopedic aftercare: Secondary | ICD-10-CM | POA: Diagnosis not present

## 2022-06-19 ENCOUNTER — Ambulatory Visit (INDEPENDENT_AMBULATORY_CARE_PROVIDER_SITE_OTHER): Payer: Medicare Other | Admitting: Nurse Practitioner

## 2022-06-19 ENCOUNTER — Encounter: Payer: Self-pay | Admitting: Nurse Practitioner

## 2022-06-19 VITALS — BP 122/72 | HR 71 | Ht 72.0 in | Wt 299.4 lb

## 2022-06-19 DIAGNOSIS — J387 Other diseases of larynx: Secondary | ICD-10-CM

## 2022-06-19 DIAGNOSIS — G4733 Obstructive sleep apnea (adult) (pediatric): Secondary | ICD-10-CM

## 2022-06-19 MED ORDER — FLUTICASONE PROPIONATE 50 MCG/ACT NA SUSP: 1.0000 | Freq: Every day | NASAL | 2 refills | Status: DC

## 2022-06-19 NOTE — Assessment & Plan Note (Signed)
BMI 40.  He has been working on weight loss measures.  Plan is to reevaluate in the spring to see if he is an inspire candidate once he has had further weight loss.  He is hopefully having knee replacement surgery in the next month, which should help with his activity tolerance.

## 2022-06-19 NOTE — Assessment & Plan Note (Signed)
He has been evaluated by ENT in the past for this.  Did not have any significant findings.  Does sound like he has some postnasal drainage.  Recommended that he try intranasal steroid to help with this.  We also had started him on Pepcid last OV for potential GERD.  Unclear whether or not this has helped.  Encouraged him to continue it for now and if no perceived benefit at follow-up, we will discontinue.

## 2022-06-19 NOTE — Progress Notes (Signed)
$'@Patient'm$  ID: Mark Spitz., male    DOB: 07-13-1961, 61 y.o.   MRN: [id number]  Chief Complaint  Patient presents with   Follow-up    Wants help with using cpap machine    Referring provider: Hali Marry, *  HPI: 61 year old male, never smoker followed for severe OSA. He is a patient of Dr. Bari Mantis and last seen in office on 04/15/2022 by [facility] NP. Past medical history significant for HTN, AAA, MS, osteoarthritis, BPH, depression, obesity.  TEST/EVENTS:  06/2012 NPSG: AHI 98/h, corrected by CPAP 11 cmH2O. 358 lb 12/2018 PFT: ratio 81, FVC 52, TLC 44, DLCO 84 03/2021 CT chest: clear lungs 10/2021 NPSG: AHI 34/h, SpO2 low 62%; 2 lpm O2 added 02/04/2022 CPAP titration: optimal PAP pressure 15 cmH2O with residual AHI 2.3/h 04/05/2022 HST: sleeping upright AHI 29/h  12/19/2021: OV with Dr. Elsworth Soho. Previously placed on Breo due to chronic cough and recurrent pneumonia. PFTs without airway obstruction; did have some restriction suspected to be r/t weight. Stopped Breo. He had recent sleep study with improvement in OSA but still considered severe 32/hr. Still struggling with CPAP compliance. Discussed various treatment options. Dental appliance suboptimal d/t severity. BMI too high for hypoglossal nerve but plan for referral to ENT while he works on weight loss measures. Offered more CPAP desensitization; he will continue to try at home. He does not notice snoring when he sleeps upright; plan for HST with him sleeping in upright position.   04/15/2022: OV with Virga Haltiwanger NP for follow up to discuss recent sleep study results.  He had a sleep study in April 2023 which showed severe sleep apnea with AHI 34.  He underwent CPAP titration February 04, 2022 due to issues with tolerating CPAP.  Was found to be well controlled on 15 cmH2O with residual AHI 2.3.  He then underwent home sleep study, not on CPAP and while sleeping in his recliner, which still showed severe sleep apnea with AHI 29.  He has a  longstanding history of OSA.  He has a CPAP machine at home but has had trouble tolerating it.  Reports that even when he has used in the past he has not noticed a huge difference in the morning.  The longest stretch of time that he used it was nightly for a week.  He does continue to snore at night and his wife has told him that he stops breathing. He saw ENT in August for evaluation of inspire device.  He was recommended to work on weight loss measures and follow-up in 6 months.  He has lost around 60 pounds.  Currently having trouble with activity due to left knee pain.  He is supposed to be undergoing knee replacement surgery in the next month.  His CPAP machine is quite old.  He was going to take it to be recalibrated to see if he could start using it again.  He denies any extreme daytime fatigue symptoms or drowsy driving. He does worry that his snoring has caused him to have throat irritation. He's struggled with a hoarse voice for a while. He denies any nasal congestion or postnasal drip. He doesn't have any reflux symptoms that he notices. No cough or increased SOB.   06/19/2022: Today - follow up Patient presents today for follow-up.  After last visit, he received a new CPAP machine.  He has been trying to be more consistent with use.  Download showed that he is 60% compliance but is only using  it for about 3-1/2 hours on average.  He does feel like his pressures might be set a little too high.  He wears a nasal mask and feels like the air is pushed out of his mouth. He does feel like it helps him sleep a little bit better. He denies any excessive daytime fatigue, sleep parasomnias/paralysis or drowsy driving. He does still have some hoarse voice and feels like there's phlegm in the back of his throat. No significant GERD symptoms. No cough or SOB. He is still working on weight loss measures. He's limited with his activity due to his chronic left knee pain. He did have surgery to remove the cyst. Plan  is to perform a knee replacement in the next month if cyst does not recur.   05/19/2022-06/17/2022: CPAP auto 8-16 cmH2O 19/30 days; 20% >4 hr; av usage 3 hr 27 min Pressure median 8.7, 95th 10.4 Leaks 95th 30.9 AHI 4.1  No Known Allergies  Immunization History  Administered Date(s) Administered   Tdap 01/04/2015    Past Medical History:  Diagnosis Date   Anxiety    chronic pain treated with narcotics   Arthritis    Chronic pain    Constipation due to opioid therapy    COPD (chronic obstructive pulmonary disease) (HCC)    Depression    Dysphonia    thoracic left sided"impingement" with heminumbness   Dyspnea    GI bleed    abt 15 years ago   Hypertension    Morbid obesity ([place])    MS (multiple sclerosis) ([place]) 02/11/2013   [name]   Neuromuscular disorder [facility]) 2014   Pneumonia    Sleep apnea    Sleep apnea with use of continuous positive airway pressure (CPAP) 02/11/2013   07-13-12 AHi of 98.6 /hr titrated to 11 cm water ,  average user time 4 hours and 4 minutes. Residual AHI 1.20 December 2012 .   Umbilical hernia     Tobacco History: Social History   Tobacco Use  Smoking Status Never  Smokeless Tobacco Never   Counseling given: Not Answered   Outpatient Medications Prior to Visit  Medication Sig Dispense Refill   cyclobenzaprine (FLEXERIL) 10 MG tablet Take 1 tablet (10 mg total) by mouth 3 (three) times daily as needed for muscle spasms. 30 tablet 0   DULoxetine (CYMBALTA) 60 MG capsule Take 60 mg by mouth 2 (two) times daily.      famotidine (PEPCID) 20 MG tablet Take 1 tablet (20 mg total) by mouth 2 (two) times daily. 60 tablet 2   gabapentin (NEURONTIN) 800 MG tablet Take 800 mg by mouth 4 (four) times daily.  11   HYDROcodone-acetaminophen (NORCO) 10-325 MG tablet Take 2 tablets by mouth every 4 (four) hours as needed for severe pain. 40 tablet 0   ibuprofen (ADVIL,MOTRIN) 800 MG tablet Take 800 mg by mouth every 8 (eight) hours as needed  for moderate pain.     losartan-hydrochlorothiazide (HYZAAR) 50-12.5 MG tablet TAKE ONE TABLET BY MOUTH EVERY DAY 30 tablet 0   morphine (MS CONTIN) 30 MG 12 hr tablet Take 1 tablet by mouth every 12 (twelve) hours.     tamsulosin (FLOMAX) 0.4 MG CAPS capsule Take 0.4 mg by mouth daily.     Teriflunomide 14 MG TABS Take 14 mg by mouth at bedtime.      tiZANidine (ZANAFLEX) 4 MG tablet Take by mouth.     amantadine (SYMMETREL) 100 MG capsule Take 100 mg by  mouth 3 (three) times daily.      furosemide (LASIX) 20 MG tablet Take 1 tablet (20 mg total) by mouth daily as needed. 20 tablet 3   tiZANidine (ZANAFLEX) 4 MG tablet Take 4 mg by mouth every 6 (six) hours as needed for muscle spasms.      No facility-administered medications prior to visit.     Review of Systems:   Constitutional: No weight loss or gain, night sweats, fevers, chills, or lassitude. +mild fatigue  HEENT: No headaches, difficulty swallowing, tooth/dental problems, or sore throat. No sneezing, itching, ear ache, nasal congestion, or post nasal drip. +voice hoarseness; occasional throat irritation (worse in AM) CV:  No chest pain, orthopnea, PND, swelling in lower extremities, anasarca, dizziness, palpitations, syncope Resp: +snoring, shortness of breath with strenuous exertion (baseline). No excess mucus or change in color of mucus. No productive or non-productive. No hemoptysis. No wheezing.  No chest wall deformity GI:  No heartburn, indigestion, abdominal pain, nausea, vomiting, diarrhea, change in bowel habits, loss of appetite, bloody stools.  MSK:  +left knee pain.  No decreased range of motion.  No back pain. Neuro: No dizziness or lightheadedness.  Psych: No depression or anxiety. Mood stable.     Physical Exam:  BP 122/72 (BP Location: Right Arm, Patient Position: Sitting, Cuff Size: Normal)   Pulse 71   Ht 6' (1.829 m)   Wt 299 lb 6.4 oz (135.8 kg)   SpO2 96%   BMI 40.61 kg/m   GEN: Pleasant,  interactive, well-appearing; morbidly obese; in no acute distress. HEENT:  Normocephalic and atraumatic. PERRLA. Sclera white. Nasal turbinates pink, moist and patent bilaterally. No rhinorrhea present. Oropharynx erythematous and moist, without exudate or edema. No lesions, ulcerations NECK:  Supple w/ fair ROM. No JVD present. Normal carotid impulses w/o bruits. Thyroid symmetrical with no goiter or nodules palpated. No lymphadenopathy.   CV: RRR, no m/r/g, no peripheral edema. Pulses intact, +2 bilaterally. No cyanosis, pallor or clubbing. PULMONARY:  Unlabored, regular breathing. Clear bilaterally A&P w/o wheezes/rales/rhonchi. No accessory muscle use. No dullness to percussion. GI: BS present and normoactive. Soft, non-tender to palpation. No organomegaly or masses detected.  MSK: No erythema, warmth or tenderness. No deformities or joint swelling noted.  Neuro: A/Ox3. No focal deficits noted.   Skin: Warm, no lesions or rashe Psych: Normal affect and behavior. Judgement and thought content appropriate.     Lab Results:  CBC    Component Value Date/Time   WBC 7.9 06/12/2021 0000   RBC 4.61 06/12/2021 0000   HGB 13.4 06/12/2021 0000   HCT 39.8 06/12/2021 0000   PLT 248 06/12/2021 0000   MCV 86.3 06/12/2021 0000   MCH 29.1 06/12/2021 0000   MCHC 33.7 06/12/2021 0000   RDW 12.5 06/12/2021 0000   LYMPHSABS 1,675 06/12/2021 0000   MONOABS 0.1 08/14/2007 0929   EOSABS 253 06/12/2021 0000   BASOSABS 47 06/12/2021 0000    BMET    Component Value Date/Time   NA 139 06/26/2021 0000   NA 141 07/21/2019 1215   K 3.9 06/26/2021 0000   CL 98 06/26/2021 0000   CO2 36 (H) 06/26/2021 0000   GLUCOSE 103 (H) 06/26/2021 0000   BUN 15 06/26/2021 0000   BUN 19 07/21/2019 1215   CREATININE 0.82 06/26/2021 0000   CALCIUM 9.5 06/26/2021 0000   GFRNONAA 96 12/21/2019 1531   GFRAA 112 12/21/2019 1531    BNP    Component Value Date/Time   BNP 17 06/12/2021  0000     Imaging:  No  results found.        No data to display          No results found for: "NITRICOXIDE"      Assessment & Plan:   OSA (obstructive sleep apnea) Severe OSA. He has struggled with tolerating CPAP in the past. Evaluated by Dr. Wilburn Cornelia for inspire device but he is currently not a candidate due to his BMI.  He is actively working on weight loss.  Unfortunately is limited by his left knee pain.  In the interim, he made the decision to restart CPAP therapy.  He has been more consistent with use but only using it about 3-1/2 hours on average.  It does appear that his pressure settings could be adjusted.  We will change him to 6-10 cmH2O to see if this helps.  Encouraged him to increase usage.  Cautioned against drowsy driving.  We again discussed risks of untreated sleep apnea.  He verbalized understanding.  Patient Instructions  Continue CPAP every night, minimum of 4-6 hours a night.  Adjust pressure settings to 6-10 cmH2O Change equipment every 30 days or as directed by DME. Wash your tubing with warm soap and water daily, hang to dry. Wash humidifier portion weekly.  Be aware of reduced alertness and do not drive or operate heavy machinery if experiencing this or drowsiness.  Healthy weight management discussed.  Avoid or decrease alcohol consumption and medications that make you more sleepy, if possible. Notify if persistent daytime sleepiness occurs even with consistent use of CPAP.   We discussed how untreated sleep apnea puts an individual at risk for cardiac arrhthymias, pulm HTN, DM, stroke and increases their risk for daytime accidents.    Follow up with Dr. Wilburn Cornelia with [facility] in the spring and work on weight loss in the interim.   Continue Famotidine 20 mg Twice daily for reflux Flonase nasal spray 2 sprays each nostril daily for postnasal drainage Avoid throat clearing. Use sugar free hard candies or non-menthol cough drops during this time to soothe your  throat.  Warm tea with honey and lemon.    Follow up in 4 months with Dr. Elsworth Soho or Alanson Aly. If symptoms do not improve or worsen, please contact office for sooner follow up    Irritable larynx He has been evaluated by ENT in the past for this.  Did not have any significant findings.  Does sound like he has some postnasal drainage.  Recommended that he try intranasal steroid to help with this.  We also had started him on Pepcid last OV for potential GERD.  Unclear whether or not this has helped.  Encouraged him to continue it for now and if no perceived benefit at follow-up, we will discontinue.  Morbid obesity (HCC) BMI 40.  He has been working on weight loss measures.  Plan is to reevaluate in the spring to see if he is an inspire candidate once he has had further weight loss.  He is hopefully having knee replacement surgery in the next month, which should help with his activity tolerance.    I spent 32 minutes of dedicated to the care of this patient on the date of this encounter to include pre-visit review of records, face-to-face time with the patient discussing conditions above, post visit ordering of testing, clinical documentation with the electronic health record, making appropriate referrals as documented, and communicating necessary findings to members of the patients care team.  Clayton Bibles, NP 06/19/2022  Pt aware and understands NP's role.

## 2022-06-19 NOTE — Patient Instructions (Addendum)
Continue CPAP every night, minimum of 4-6 hours a night.  Adjust pressure settings to 6-10 cmH2O Change equipment every 30 days or as directed by DME. Wash your tubing with warm soap and water daily, hang to dry. Wash humidifier portion weekly.  Be aware of reduced alertness and do not drive or operate heavy machinery if experiencing this or drowsiness.  Healthy weight management discussed.  Avoid or decrease alcohol consumption and medications that make you more sleepy, if possible. Notify if persistent daytime sleepiness occurs even with consistent use of CPAP.   We discussed how untreated sleep apnea puts an individual at risk for cardiac arrhthymias, pulm HTN, DM, stroke and increases their risk for daytime accidents.    Follow up with Dr. Wilburn Cornelia with [facility] Th[facility] in the spring and work on weight loss in the interim.   Continue Famotidine 20 mg Twice daily for reflux Flonase nasal spray 2 sprays each nostril daily for postnasal drainage Avoid throat clearing. Use sugar free hard candies or non-menthol cough drops during this time to soothe your throat.  Warm tea with honey and lemon.    Follow up in 4 months with Dr. Elsworth Soho or Alanson Aly. If symptoms do not improve or worsen, please contact office for sooner follow up

## 2022-06-19 NOTE — Assessment & Plan Note (Signed)
Severe OSA. He has struggled with tolerating CPAP in the past. Evaluated by Dr. Wilburn Cornelia for inspire device but he is currently not a candidate due to his BMI.  He is actively working on weight loss.  Unfortunately is limited by his left knee pain.  In the interim, he made the decision to restart CPAP therapy.  He has been more consistent with use but only using it about 3-1/2 hours on average.  It does appear that his pressure settings could be adjusted.  We will change him to 6-10 cmH2O to see if this helps.  Encouraged him to increase usage.  Cautioned against drowsy driving.  We again discussed risks of untreated sleep apnea.  He verbalized understanding.  Patient Instructions  Continue CPAP every night, minimum of 4-6 hours a night.  Adjust pressure settings to 6-10 cmH2O Change equipment every 30 days or as directed by DME. Wash your tubing with warm soap and water daily, hang to dry. Wash humidifier portion weekly.  Be aware of reduced alertness and do not drive or operate heavy machinery if experiencing this or drowsiness.  Healthy weight management discussed.  Avoid or decrease alcohol consumption and medications that make you more sleepy, if possible. Notify if persistent daytime sleepiness occurs even with consistent use of CPAP.   We discussed how untreated sleep apnea puts an individual at risk for cardiac arrhthymias, pulm HTN, DM, stroke and increases their risk for daytime accidents.    Follow up with Dr. Wilburn Cornelia with [facility] in the spring and work on weight loss in the interim.   Continue Famotidine 20 mg Twice daily for reflux Flonase nasal spray 2 sprays each nostril daily for postnasal drainage Avoid throat clearing. Use sugar free hard candies or non-menthol cough drops during this time to soothe your throat.  Warm tea with honey and lemon.    Follow up in 4 months with Dr. Elsworth Soho or Alanson Aly. If symptoms do not improve or worsen, please contact office for  sooner follow up

## 2022-06-20 ENCOUNTER — Other Ambulatory Visit: Payer: Self-pay

## 2022-06-27 ENCOUNTER — Ambulatory Visit: Payer: Medicare Other | Admitting: Cardiology

## 2022-06-27 NOTE — Progress Notes (Deleted)
HPI: FU hypertension and TAA.  Outside echocardiogram October 2019 showed normal LV function and severe left ventricular hypertrophy.  Plan at previous office visit was to control blood pressure and repeat echocardiogram in the future.  I also reviewed previous outside echocardiogram and felt left ventricular hypertrophy was mild with mild proximal septal thickening.  Echocardiogram December 2020 showed normal LV function and moderately dilated ascending aorta measuring 49 mm. CTA September 2023 showed ascending thoracic aortic aneurysm measuring 4.3 cm. Since last seen,   Current Outpatient Medications  Medication Sig Dispense Refill   amantadine (SYMMETREL) 100 MG capsule Take 100 mg by mouth 3 (three) times daily.      cyclobenzaprine (FLEXERIL) 10 MG tablet Take 1 tablet (10 mg total) by mouth 3 (three) times daily as needed for muscle spasms. 30 tablet 0   DULoxetine (CYMBALTA) 60 MG capsule Take 60 mg by mouth 2 (two) times daily.      famotidine (PEPCID) 20 MG tablet Take 1 tablet (20 mg total) by mouth 2 (two) times daily. 60 tablet 2   fluticasone (FLONASE) 50 MCG/ACT nasal spray Place 1 spray into both nostrils daily. 18.2 mL 2   furosemide (LASIX) 20 MG tablet Take 1 tablet (20 mg total) by mouth daily as needed. 20 tablet 3   gabapentin (NEURONTIN) 800 MG tablet Take 800 mg by mouth 4 (four) times daily.  11   HYDROcodone-acetaminophen (NORCO) 10-325 MG tablet Take 2 tablets by mouth every 4 (four) hours as needed for severe pain. 40 tablet 0   ibuprofen (ADVIL,MOTRIN) 800 MG tablet Take 800 mg by mouth every 8 (eight) hours as needed for moderate pain.     losartan-hydrochlorothiazide (HYZAAR) 50-12.5 MG tablet TAKE ONE TABLET BY MOUTH EVERY DAY 30 tablet 0   morphine (MS CONTIN) 30 MG 12 hr tablet Take 1 tablet by mouth every 12 (twelve) hours.     tamsulosin (FLOMAX) 0.4 MG CAPS capsule Take 0.4 mg by mouth daily.     Teriflunomide 14 MG TABS Take 14 mg by mouth at bedtime.       tiZANidine (ZANAFLEX) 4 MG tablet Take 4 mg by mouth every 6 (six) hours as needed for muscle spasms.      tiZANidine (ZANAFLEX) 4 MG tablet Take by mouth.     No current facility-administered medications for this visit.     Past Medical History:  Diagnosis Date   Anxiety    chronic pain treated with narcotics   Arthritis    Chronic pain    Constipation due to opioid therapy    COPD (chronic obstructive pulmonary disease) (HCC)    Depression    Dysphonia    thoracic left sided"impingement" with heminumbness   Dyspnea    GI bleed    abt 15 years ago   Hypertension    Morbid obesity ([place])    MS (multiple sclerosis) ([place]) 02/11/2013   [name]   Neuromuscular disorder [facility]) 2014   Pneumonia    Sleep apnea    Sleep apnea with use of continuous positive airway pressure (CPAP) 02/11/2013   07-13-12 AHi of 98.6 /hr titrated to 11 cm water ,  average user time 4 hours and 4 minutes. Residual AHI 1.20 December 2012 .   Umbilical hernia     Past Surgical History:  Procedure Laterality Date   ANTERIOR CERVICAL DECOMP/DISCECTOMY FUSION N/A 09/29/2019   Procedure: Anterior Cervical Decompression Fusion - Cervical Four-Cervical Five;  Surgeon: [name],  MD;  Location: [place];  Service: Neurosurgery;  Laterality: N/A;  Anterior Cervical Decompression Fusion - Cervical Four-Cervical Five    CARPAL TUNNEL RELEASE Right    [name] TUNNEL RELEASE Right 09/29/2019   Procedure: Carpal Tunnel Release;  Surgeon: [name], MD;  Location: [place];  Service: Neurosurgery;  Laterality: Right;   COLONOSCOPY W/ POLYPECTOMY     ESOPHAGOGASTRODUODENOSCOPY     MULTIPLE TOOTH EXTRACTIONS     POSTERIOR LUMBAR FUSION  04/17/2015   [name], also removed old hardware   SPINE SURGERY     total of six vertebras fused, three lumbar surgeries and one anterior neck fusion   TONSILLECTOMY      Social History   Socioeconomic History   Marital status: Married    Spouse name: [name]    Number of children: 2   Years of education: 12+   Highest education level: Some college, no degree  Occupational History   Occupation: Equities trader shop    Comment: disablity  Tobacco Use   Smoking status: Never   Smokeless tobacco: Never  Vaping Use   Vaping Use: Never used  Substance and Sexual Activity   Alcohol use: Not Currently    Comment: occasionally   Drug use: No   Sexual activity: Not Currently  Other Topics Concern   Not on file  Social History Narrative   Lives with his wife. They have two children. He enjoys playing video games.      Social Determinants of Health   Financial Resource Strain: Low Risk  (08/17/2021)   Overall Financial Resource Strain (CARDIA)    Difficulty of Paying Living Expenses: Not hard at all  Food Insecurity: No Food Insecurity (08/17/2021)   Hunger Vital Sign    Worried About Running Out of Food in the Last Year: Never true    Ran Out of Food in the Last Year: Never true  Transportation Needs: No Transportation Needs (08/17/2021)   PRAPARE - Hydrologist (Medical): No    Lack of Transportation (Non-Medical): No  Physical Activity: Inactive (08/17/2021)   Exercise Vital Sign    Days of Exercise per Week: 0 days    Minutes of Exercise per Session: 0 min  Stress: No Stress Concern Present (08/17/2021)   [place]    Feeling of Stress : Not at all  Social Connections: Moderately Integrated (08/17/2021)   Social Connection and Isolation Panel [NHANES]    Frequency of Communication with Friends and Family: More than three times a week    Frequency of Social Gatherings with Friends and Family: Never    Attends Religious Services: 1 to 4 times per year    Active Member of [employer] or Organizations: No    Attends Archivist Meetings: Never    Marital Status: Married  Human resources officer Violence: Not At Risk (08/17/2021)   Humiliation, Afraid,  Rape, and Kick questionnaire    Fear of Current or Ex-Partner: No    Emotionally Abused: No    Physically Abused: No    Sexually Abused: No    Family History  Problem Relation Age of Onset   Arrhythmia Mother    Cancer Mother    Cancer Other    Cancer Other    Sleep walking Son    Obesity Sister     ROS: no fevers or chills, productive cough, hemoptysis, dysphasia, odynophagia, melena, hematochezia, dysuria, hematuria, rash, seizure  activity, orthopnea, PND, pedal edema, claudication. Remaining systems are negative.  Physical Exam: Well-developed well-nourished in no acute distress.  Skin is warm and dry.  HEENT is normal.  Neck is supple.  Chest is clear to auscultation with normal expansion.  Cardiovascular exam is regular rate and rhythm.  Abdominal exam nontender or distended. No masses palpated. Extremities show no edema. neuro grossly intact  ECG- personally reviewed  A/P  1 thoracic aortic aneurysm-plan follow-up CTA September 2024.  2 hypertension-patient's blood pressure is controlled.  Continue present medical regimen.  3 obstructive sleep apnea-continue CPAP.  4 obesity-continue efforts at weight loss.  Kirk Ruths, MD

## 2022-07-03 DIAGNOSIS — G8381 Brown-Sequard syndrome: Secondary | ICD-10-CM | POA: Diagnosis not present

## 2022-07-03 DIAGNOSIS — G629 Polyneuropathy, unspecified: Secondary | ICD-10-CM | POA: Diagnosis not present

## 2022-07-03 DIAGNOSIS — G35 Multiple sclerosis: Secondary | ICD-10-CM | POA: Diagnosis not present

## 2022-07-03 DIAGNOSIS — Z5181 Encounter for therapeutic drug level monitoring: Secondary | ICD-10-CM | POA: Diagnosis not present

## 2022-07-03 DIAGNOSIS — E669 Obesity, unspecified: Secondary | ICD-10-CM | POA: Diagnosis not present

## 2022-07-03 DIAGNOSIS — Z6841 Body Mass Index (BMI) 40.0 and over, adult: Secondary | ICD-10-CM | POA: Diagnosis not present

## 2022-07-03 DIAGNOSIS — Z79899 Other long term (current) drug therapy: Secondary | ICD-10-CM | POA: Diagnosis not present

## 2022-07-11 ENCOUNTER — Other Ambulatory Visit: Payer: Self-pay | Admitting: *Deleted

## 2022-07-11 DIAGNOSIS — K219 Gastro-esophageal reflux disease without esophagitis: Secondary | ICD-10-CM

## 2022-07-11 MED ORDER — FAMOTIDINE 20 MG PO TABS: 20.0000 mg | ORAL_TABLET | Freq: Two times a day (BID) | ORAL | 3 refills | Status: DC

## 2022-07-24 ENCOUNTER — Encounter: Payer: Self-pay | Admitting: Family Medicine

## 2022-07-24 ENCOUNTER — Ambulatory Visit (INDEPENDENT_AMBULATORY_CARE_PROVIDER_SITE_OTHER): Payer: BC Managed Care – PPO | Admitting: Family Medicine

## 2022-07-24 VITALS — BP 142/83 | HR 70 | Ht 72.0 in | Wt 309.0 lb

## 2022-07-24 DIAGNOSIS — M79604 Pain in right leg: Secondary | ICD-10-CM | POA: Diagnosis not present

## 2022-07-24 DIAGNOSIS — M79605 Pain in left leg: Secondary | ICD-10-CM

## 2022-07-24 DIAGNOSIS — G629 Polyneuropathy, unspecified: Secondary | ICD-10-CM | POA: Diagnosis not present

## 2022-07-24 DIAGNOSIS — B070 Plantar wart: Secondary | ICD-10-CM | POA: Diagnosis not present

## 2022-07-24 DIAGNOSIS — I10 Essential (primary) hypertension: Secondary | ICD-10-CM

## 2022-07-24 DIAGNOSIS — R600 Localized edema: Secondary | ICD-10-CM

## 2022-07-24 MED ORDER — LOSARTAN POTASSIUM-HCTZ 50-12.5 MG PO TABS: 1.0000 | ORAL_TABLET | Freq: Every day | ORAL | 1 refills | Status: DC

## 2022-07-24 MED ORDER — FUROSEMIDE 20 MG PO TABS: 20.0000 mg | ORAL_TABLET | Freq: Every day | ORAL | 1 refills | Status: DC | PRN

## 2022-07-24 MED ORDER — PREDNISONE 20 MG PO TABS: ORAL_TABLET | ORAL | 0 refills | Status: AC

## 2022-07-24 NOTE — Progress Notes (Signed)
Established Patient Office Visit  Subjective   Patient ID: Mark [name]., male    DOB: 1961-03-21  Age: 62 y.o. MRN: [id number]  Chief Complaint  Patient presents with   Leg Pain   Hypertension    HPI  Hypertension- Pt denies chest pain, SOB, dizziness, or heart palpitations.  Taking meds as directed w/o problems.  Denies medication side effects.  He has been out of his blood pressure for a couple of weeks and would like refills today. Last serum creatinine 0.8.    He is also been experiencing a lot of leg pain.  He has had multiple prior back surgeries he has made an appointment with his neurosurgeon who he has not seen in a long time.  The appointment is at the end of the month.  He says in the past prednisone has actually been really helpful for him to really relieve his pain.  He says the pain is constant.  It has been bothering him even at night.  He does have some swelling in both lower extremities but says that it is stable.  He also follows with pain management.  He also has been dealing with recurring plantar warts.  We have done some cryotherapy in the past and he is interested in some more definitive type treatment.  He has been paring them down on his own.   He also feels like his feet are partially numb from his neuropathy.     ROS    Objective:     BP (!) 142/83   Pulse 70   Ht 6' (1.829 m)   Wt (!) 309 lb (140.2 kg)   SpO2 93%   BMI 41.91 kg/m    Physical Exam Constitutional:      Appearance: He is well-developed.  HENT:     Head: Normocephalic and atraumatic.  Cardiovascular:     Rate and Rhythm: Normal rate and regular rhythm.     Heart sounds: Normal heart sounds.  Pulmonary:     Effort: Pulmonary effort is normal.     Breath sounds: Normal breath sounds.  Skin:    General: Skin is warm and dry.  Neurological:     Mental Status: He is alert and oriented to person, place, and time.  Psychiatric:        Behavior: Behavior normal.      No results found for any visits on 07/24/22.    The ASCVD Risk score (Arnett DK, et al., 2019) failed to calculate for the following reasons:   The valid total cholesterol range is 130 to 320 mg/dL    Assessment & Plan:   Problem List Items Addressed This Visit       Cardiovascular and Mediastinum   Essential hypertension - Primary    Encouraged to restart blood pressure medication.  If blood pressure not coming back down then please let us know.      Relevant Medications   losartan-hydrochlorothiazide (HYZAAR) 50-12.5 MG tablet   furosemide (LASIX) 20 MG tablet     Nervous and Auditory   Neuropathy   Relevant Orders   Ambulatory referral to Podiatry     Other   Pain in both lower extremities   Other Visit Diagnoses     Plantar wart       Relevant Orders   Ambulatory referral to Podiatry   Lower extremity edema       Relevant Medications   furosemide (LASIX) 20 MG tablet  Planter warts with neuropathy-will refer to podiatry for further evaluation.  We did discuss the possibility of doing cryotherapy again but that it does usually take multiple treatments he declined at this time.  Bilateral leg pain-planning on getting back in with his surgeon at the end of the month.  I did give him a 2-week taper of prednisone just for the short-term to try to give him some pain relief.  He is already on chronic narcotics through pain management.   Return in about 6 months (around 01/22/2023), or if symptoms worsen or fail to improve, for Hypertension.    Beatrice Lecher, MD

## 2022-07-24 NOTE — Assessment & Plan Note (Signed)
Encouraged to restart blood pressure medication.  If blood pressure not coming back down then please let us know.

## 2022-07-26 DIAGNOSIS — Z4789 Encounter for other orthopedic aftercare: Secondary | ICD-10-CM | POA: Diagnosis not present

## 2022-07-26 DIAGNOSIS — M1712 Unilateral primary osteoarthritis, left knee: Secondary | ICD-10-CM | POA: Diagnosis not present

## 2022-07-30 DIAGNOSIS — G4733 Obstructive sleep apnea (adult) (pediatric): Secondary | ICD-10-CM | POA: Diagnosis not present

## 2022-08-01 ENCOUNTER — Ambulatory Visit: Payer: Medicare Other | Admitting: Podiatry

## 2022-08-01 ENCOUNTER — Encounter: Payer: Self-pay | Admitting: Podiatry

## 2022-08-01 DIAGNOSIS — Q828 Other specified congenital malformations of skin: Secondary | ICD-10-CM | POA: Diagnosis not present

## 2022-08-01 DIAGNOSIS — B070 Plantar wart: Secondary | ICD-10-CM

## 2022-08-01 NOTE — Progress Notes (Signed)
  Subjective:  Patient ID: Mark [name]., male    DOB: August 30, 1960,   MRN: [id number]  Chief Complaint  Patient presents with   Plantar Warts    Bil plantar warts    Nail Problem    Deformed great toenail broken and never grew back right   Peripheral Neuropathy    Neuropathy    62 y.o. male presents for several foot concerns including neuropathy, plantar warts and a deformed great toenail. Relates the lesions on his feet are the most bothersome. States he has had them frozen off but have returned . He is not diabetic but he does have neuropathy as a sequelae from previous back pain and subsequent surgeries. Also has a history of MS. He is currently taking gabapentin for the neuropathy Denies any other pedal complaints. Denies n/v/f/c.   Past Medical History:  Diagnosis Date   Anxiety    chronic pain treated with narcotics   Arthritis    Chronic pain    Constipation due to opioid therapy    COPD (chronic obstructive pulmonary disease) (HCC)    Depression    Dysphonia    thoracic left sided"impingement" with heminumbness   Dyspnea    GI bleed    abt 15 years ago   Hypertension    Morbid obesity ([place])    MS (multiple sclerosis) ([place]) 02/11/2013   [name]   Neuromuscular disorder [facility]) 2014   Pneumonia    Sleep apnea    Sleep apnea with use of continuous positive airway pressure (CPAP) 02/11/2013   07-13-12 AHi of 98.6 /hr titrated to 11 cm water ,  average user time 4 hours and 4 minutes. Residual AHI 1.20 December 2012 .   Umbilical hernia     Objective:  Physical Exam: Vascular: DP/PT pulses 2/4 bilateral. CFT <3 seconds. Normal hair growth on digits. No edema.  Skin. No lacerations or abrasions bilateral feet. Hyperkeratotic cored tissue noted to plantar lateral feet bilateral. On left there is some noted pinpoint bleeding and some disruption of the skin lines.  Musculoskeletal: MMT 5/5 bilateral lower extremities in DF, PF, Inversion and Eversion.  Deceased ROM in DF of ankle joint.  Neurological: Sensation intact to light touch.   Assessment:   1. Plantar wart   2. Porokeratosis      Plan:  Patient was evaluated and treated and all questions answered. -Discussed warts and porokertosis and their etiology with patient and treatment options.  -Hyperkeratotic tissue was debrided with chisel without incident.  -Applied salycylic acid treatment to area with dressing. Advised to remove bandaging tomorrow.  -Patient scheduled to have knee surgery soon and does not want anything to cause to many issues in getting that done.  -Discussed future options such as laser treatment if unsuccessful.  -Advised good supportive shoes and inserts -Patient to return to office if needed after knee surgery.  Lorenda Peck, DPM

## 2022-08-08 ENCOUNTER — Other Ambulatory Visit: Payer: Self-pay | Admitting: Family Medicine

## 2022-08-08 DIAGNOSIS — N400 Enlarged prostate without lower urinary tract symptoms: Secondary | ICD-10-CM | POA: Diagnosis not present

## 2022-08-08 DIAGNOSIS — Z79899 Other long term (current) drug therapy: Secondary | ICD-10-CM | POA: Diagnosis not present

## 2022-08-08 DIAGNOSIS — K219 Gastro-esophageal reflux disease without esophagitis: Secondary | ICD-10-CM | POA: Diagnosis not present

## 2022-08-08 DIAGNOSIS — Z01818 Encounter for other preprocedural examination: Secondary | ICD-10-CM | POA: Diagnosis not present

## 2022-08-08 DIAGNOSIS — R5383 Other fatigue: Secondary | ICD-10-CM | POA: Diagnosis not present

## 2022-08-08 DIAGNOSIS — J984 Other disorders of lung: Secondary | ICD-10-CM | POA: Diagnosis not present

## 2022-08-08 DIAGNOSIS — I10 Essential (primary) hypertension: Secondary | ICD-10-CM | POA: Diagnosis not present

## 2022-08-08 DIAGNOSIS — Z01812 Encounter for preprocedural laboratory examination: Secondary | ICD-10-CM | POA: Diagnosis not present

## 2022-08-08 DIAGNOSIS — M1712 Unilateral primary osteoarthritis, left knee: Secondary | ICD-10-CM | POA: Diagnosis not present

## 2022-08-08 DIAGNOSIS — I714 Abdominal aortic aneurysm, without rupture, unspecified: Secondary | ICD-10-CM | POA: Diagnosis not present

## 2022-08-08 DIAGNOSIS — G4733 Obstructive sleep apnea (adult) (pediatric): Secondary | ICD-10-CM | POA: Diagnosis not present

## 2022-08-08 DIAGNOSIS — R600 Localized edema: Secondary | ICD-10-CM

## 2022-08-08 LAB — COMPREHENSIVE METABOLIC PANEL
Albumin: 4.3 (ref 3.5–5.0)
Calcium: 9.4 (ref 8.7–10.7)
Globulin: 2.4
eGFR: 104

## 2022-08-08 LAB — BASIC METABOLIC PANEL
BUN: 18 (ref 4–21)
CO2: 30 — AB (ref 13–22)
Chloride: 95 — AB (ref 99–108)
Creatinine: 0.7 (ref 0.6–1.3)
Glucose: 125
Potassium: 4.2 mEq/L (ref 3.5–5.1)
Sodium: 132 — AB (ref 137–147)

## 2022-08-08 LAB — CBC: RBC: 4.62 (ref 3.87–5.11)

## 2022-08-08 LAB — CBC AND DIFFERENTIAL
HCT: 41 (ref 41–53)
Hemoglobin: 13.1 — AB (ref 13.5–17.5)
Platelets: 202 10*3/uL (ref 150–400)
WBC: 8.1

## 2022-08-08 LAB — HEPATIC FUNCTION PANEL
ALT: 14 U/L (ref 10–40)
AST: 16 (ref 14–40)

## 2022-08-16 DIAGNOSIS — M25762 Osteophyte, left knee: Secondary | ICD-10-CM | POA: Diagnosis not present

## 2022-08-16 DIAGNOSIS — Z9889 Other specified postprocedural states: Secondary | ICD-10-CM | POA: Diagnosis not present

## 2022-08-16 DIAGNOSIS — M25462 Effusion, left knee: Secondary | ICD-10-CM | POA: Diagnosis not present

## 2022-08-16 DIAGNOSIS — S83242A Other tear of medial meniscus, current injury, left knee, initial encounter: Secondary | ICD-10-CM | POA: Diagnosis not present

## 2022-08-19 DIAGNOSIS — K080 Exfoliation of teeth due to systemic causes: Secondary | ICD-10-CM | POA: Diagnosis not present

## 2022-08-19 DIAGNOSIS — Z01818 Encounter for other preprocedural examination: Secondary | ICD-10-CM | POA: Diagnosis not present

## 2022-08-19 DIAGNOSIS — M1712 Unilateral primary osteoarthritis, left knee: Secondary | ICD-10-CM | POA: Diagnosis not present

## 2022-08-20 ENCOUNTER — Observation Stay (HOSPITAL_COMMUNITY)
Admission: EM | Admit: 2022-08-20 | Discharge: 2022-08-21 | Disposition: A | Discharge status: Home / Self Care | Payer: Medicare Other | Attending: Family Medicine | Admitting: Family Medicine

## 2022-08-20 ENCOUNTER — Emergency Department (HOSPITAL_COMMUNITY): Payer: Medicare Other

## 2022-08-20 DIAGNOSIS — J9601 Acute respiratory failure with hypoxia: Secondary | ICD-10-CM | POA: Diagnosis not present

## 2022-08-20 DIAGNOSIS — Z79899 Other long term (current) drug therapy: Secondary | ICD-10-CM | POA: Insufficient documentation

## 2022-08-20 DIAGNOSIS — N318 Other neuromuscular dysfunction of bladder: Secondary | ICD-10-CM | POA: Insufficient documentation

## 2022-08-20 DIAGNOSIS — R059 Cough, unspecified: Secondary | ICD-10-CM | POA: Diagnosis not present

## 2022-08-20 DIAGNOSIS — J101 Influenza due to other identified influenza virus with other respiratory manifestations: Secondary | ICD-10-CM

## 2022-08-20 DIAGNOSIS — J09X2 Influenza due to identified novel influenza A virus with other respiratory manifestations: Secondary | ICD-10-CM | POA: Diagnosis not present

## 2022-08-20 DIAGNOSIS — J9600 Acute respiratory failure, unspecified whether with hypoxia or hypercapnia: Secondary | ICD-10-CM | POA: Diagnosis present

## 2022-08-20 DIAGNOSIS — I7121 Aneurysm of the ascending aorta, without rupture: Secondary | ICD-10-CM | POA: Insufficient documentation

## 2022-08-20 DIAGNOSIS — I10 Essential (primary) hypertension: Secondary | ICD-10-CM | POA: Insufficient documentation

## 2022-08-20 DIAGNOSIS — G4733 Obstructive sleep apnea (adult) (pediatric): Secondary | ICD-10-CM

## 2022-08-20 DIAGNOSIS — Z1152 Encounter for screening for COVID-19: Secondary | ICD-10-CM | POA: Insufficient documentation

## 2022-08-20 DIAGNOSIS — F418 Other specified anxiety disorders: Secondary | ICD-10-CM | POA: Diagnosis present

## 2022-08-20 DIAGNOSIS — J441 Chronic obstructive pulmonary disease with (acute) exacerbation: Secondary | ICD-10-CM

## 2022-08-20 DIAGNOSIS — R0602 Shortness of breath: Secondary | ICD-10-CM | POA: Diagnosis not present

## 2022-08-20 DIAGNOSIS — G35 Multiple sclerosis: Secondary | ICD-10-CM

## 2022-08-20 DIAGNOSIS — R509 Fever, unspecified: Secondary | ICD-10-CM | POA: Diagnosis not present

## 2022-08-20 DIAGNOSIS — R062 Wheezing: Secondary | ICD-10-CM | POA: Diagnosis not present

## 2022-08-20 DIAGNOSIS — N319 Neuromuscular dysfunction of bladder, unspecified: Secondary | ICD-10-CM | POA: Diagnosis present

## 2022-08-20 DIAGNOSIS — R069 Unspecified abnormalities of breathing: Secondary | ICD-10-CM | POA: Diagnosis not present

## 2022-08-20 DIAGNOSIS — N400 Enlarged prostate without lower urinary tract symptoms: Secondary | ICD-10-CM | POA: Insufficient documentation

## 2022-08-20 LAB — CBC WITH DIFFERENTIAL/PLATELET
Abs Immature Granulocytes: 0.02 10*3/uL (ref 0.00–0.07)
Basophils Absolute: 0 10*3/uL (ref 0.0–0.1)
Basophils Relative: 1 %
Eosinophils Absolute: 0.1 10*3/uL (ref 0.0–0.5)
Eosinophils Relative: 2 %
HCT: 38.6 % — ABNORMAL LOW (ref 39.0–52.0)
Hemoglobin: 12.4 g/dL — ABNORMAL LOW (ref 13.0–17.0)
Immature Granulocytes: 0 %
Lymphocytes Relative: 4 %
Lymphs Abs: 0.2 10*3/uL — ABNORMAL LOW (ref 0.7–4.0)
MCH: 27.8 pg (ref 26.0–34.0)
MCHC: 32.1 g/dL (ref 30.0–36.0)
MCV: 86.5 fL (ref 80.0–100.0)
Monocytes Absolute: 0.3 10*3/uL (ref 0.1–1.0)
Monocytes Relative: 7 %
Neutro Abs: 3.9 10*3/uL (ref 1.7–7.7)
Neutrophils Relative %: 86 %
Platelets: 161 10*3/uL (ref 150–400)
RBC: 4.46 MIL/uL (ref 4.22–5.81)
RDW: 13 % (ref 11.5–15.5)
WBC: 4.5 10*3/uL (ref 4.0–10.5)
nRBC: 0 % (ref 0.0–0.2)

## 2022-08-20 LAB — MAGNESIUM: Magnesium: 2.2 mg/dL (ref 1.7–2.4)

## 2022-08-20 LAB — COMPREHENSIVE METABOLIC PANEL
ALT: 20 U/L (ref 0–44)
AST: 24 U/L (ref 15–41)
Albumin: 3.8 g/dL (ref 3.5–5.0)
Alkaline Phosphatase: 110 U/L (ref 38–126)
Anion gap: 11 (ref 5–15)
BUN: 16 mg/dL (ref 6–20)
CO2: 31 mmol/L (ref 22–32)
Calcium: 8.8 mg/dL — ABNORMAL LOW (ref 8.9–10.3)
Chloride: 91 mmol/L — ABNORMAL LOW (ref 98–111)
Creatinine, Ser: 0.79 mg/dL (ref 0.61–1.24)
GFR, Estimated: >60 mL/min (ref >60)
Glucose, Bld: 111 mg/dL — ABNORMAL HIGH (ref 70–99)
Potassium: 4.3 mmol/L (ref 3.5–5.1)
Sodium: 133 mmol/L — ABNORMAL LOW (ref 135–145)
Total Bilirubin: 0.6 mg/dL (ref 0.3–1.2)
Total Protein: 6.5 g/dL (ref 6.5–8.1)

## 2022-08-20 LAB — RESP PANEL BY RT-PCR (RSV, FLU A&B, COVID)  RVPGX2
Influenza A by PCR: POSITIVE — AB
Influenza B by PCR: NEGATIVE
Resp Syncytial Virus by PCR: NEGATIVE
SARS Coronavirus 2 by RT PCR: NEGATIVE

## 2022-08-20 LAB — D-DIMER, QUANTITATIVE: D-Dimer, Quant: 0.69 ug/mL-FEU — ABNORMAL HIGH (ref 0.00–0.50)

## 2022-08-20 LAB — BRAIN NATRIURETIC PEPTIDE: B Natriuretic Peptide: 63 pg/mL (ref 0.0–100.0)

## 2022-08-20 MED ORDER — LACTATED RINGERS IV BOLUS
500.0000 mL | Freq: Once | INTRAVENOUS | Status: AC
  Administered 2022-08-20: 500 mL via INTRAVENOUS

## 2022-08-20 MED ORDER — HYDROCHLOROTHIAZIDE 12.5 MG PO TABS
12.5000 mg | ORAL_TABLET | Freq: Every day | ORAL | Status: DC
  Administered 2022-08-21: 12.5 mg via ORAL
  Filled 2022-08-20: qty 1

## 2022-08-20 MED ORDER — OXYCODONE-ACETAMINOPHEN 5-325 MG PO TABS
2.0000 | ORAL_TABLET | Freq: Once | ORAL | Status: AC
  Administered 2022-08-20: 2 via ORAL
  Filled 2022-08-20: qty 2

## 2022-08-20 MED ORDER — IPRATROPIUM-ALBUTEROL 0.5-2.5 (3) MG/3ML IN SOLN
RESPIRATORY_TRACT | Status: AC
  Administered 2022-08-20: 3 mL via RESPIRATORY_TRACT
  Filled 2022-08-20: qty 3

## 2022-08-20 MED ORDER — OSELTAMIVIR PHOSPHATE 75 MG PO CAPS
75.0000 mg | ORAL_CAPSULE | Freq: Two times a day (BID) | ORAL | Status: DC
  Administered 2022-08-20 – 2022-08-21 (×2): 75 mg via ORAL
  Filled 2022-08-20 (×2): qty 1

## 2022-08-20 MED ORDER — IPRATROPIUM-ALBUTEROL 0.5-2.5 (3) MG/3ML IN SOLN
3.0000 mL | Freq: Four times a day (QID) | RESPIRATORY_TRACT | Status: DC
  Administered 2022-08-21: 3 mL via RESPIRATORY_TRACT
  Filled 2022-08-20: qty 3

## 2022-08-20 MED ORDER — AMOXICILLIN-POT CLAVULANATE 875-125 MG PO TABS: 1.0000 | ORAL_TABLET | Freq: Two times a day (BID) | ORAL | 0 refills | Status: DC

## 2022-08-20 MED ORDER — HYDROCODONE-ACETAMINOPHEN 10-325 MG PO TABS
2.0000 | ORAL_TABLET | ORAL | Status: DC | PRN
  Administered 2022-08-20: 2 via ORAL
  Filled 2022-08-20: qty 2

## 2022-08-20 MED ORDER — LACTATED RINGERS IV BOLUS: 1000.0000 mL | Freq: Once | INTRAVENOUS | Status: DC

## 2022-08-20 MED ORDER — TAMSULOSIN HCL 0.4 MG PO CAPS
0.4000 mg | ORAL_CAPSULE | Freq: Every day | ORAL | Status: DC
  Administered 2022-08-21: 0.4 mg via ORAL
  Filled 2022-08-20: qty 1

## 2022-08-20 MED ORDER — ONDANSETRON HCL 4 MG/2ML IJ SOLN: 4.0000 mg | Freq: Four times a day (QID) | INTRAMUSCULAR | Status: DC | PRN

## 2022-08-20 MED ORDER — FUROSEMIDE 20 MG PO TABS: 20.0000 mg | ORAL_TABLET | Freq: Every day | ORAL | Status: DC | PRN

## 2022-08-20 MED ORDER — AMPHETAMINE-DEXTROAMPHET ER 30 MG PO CP24
30.0000 mg | ORAL_CAPSULE | Freq: Every morning | ORAL | Status: DC
  Administered 2022-08-21: 30 mg via ORAL
  Filled 2022-08-20: qty 1

## 2022-08-20 MED ORDER — AZITHROMYCIN 250 MG PO TABS: 250.0000 mg | ORAL_TABLET | Freq: Every day | ORAL | 0 refills | Status: AC

## 2022-08-20 MED ORDER — IBUPROFEN 400 MG PO TABS: 800.0000 mg | ORAL_TABLET | Freq: Three times a day (TID) | ORAL | Status: DC | PRN

## 2022-08-20 MED ORDER — METHYLPREDNISOLONE SODIUM SUCC 125 MG IJ SOLR
125.0000 mg | Freq: Once | INTRAMUSCULAR | Status: AC
  Administered 2022-08-20: 125 mg via INTRAVENOUS
  Filled 2022-08-20: qty 2

## 2022-08-20 MED ORDER — ENOXAPARIN SODIUM 40 MG/0.4ML IJ SOSY: 40.0000 mg | PREFILLED_SYRINGE | INTRAMUSCULAR | Status: DC

## 2022-08-20 MED ORDER — SODIUM CHLORIDE 0.9 % IV SOLN
1.0000 g | Freq: Once | INTRAVENOUS | Status: AC
  Administered 2022-08-20: 1 g via INTRAVENOUS
  Filled 2022-08-20: qty 10

## 2022-08-20 MED ORDER — GABAPENTIN 400 MG PO CAPS
800.0000 mg | ORAL_CAPSULE | Freq: Four times a day (QID) | ORAL | Status: DC
  Administered 2022-08-20 – 2022-08-21 (×2): 800 mg via ORAL
  Filled 2022-08-20 (×2): qty 2

## 2022-08-20 MED ORDER — FAMOTIDINE 20 MG PO TABS
20.0000 mg | ORAL_TABLET | Freq: Two times a day (BID) | ORAL | Status: DC
  Administered 2022-08-20 – 2022-08-21 (×2): 20 mg via ORAL
  Filled 2022-08-20 (×2): qty 1

## 2022-08-20 MED ORDER — SODIUM CHLORIDE 0.9 % IV SOLN
500.0000 mg | Freq: Once | INTRAVENOUS | Status: AC
  Administered 2022-08-20: 500 mg via INTRAVENOUS
  Filled 2022-08-20: qty 5

## 2022-08-20 MED ORDER — ALBUTEROL SULFATE (2.5 MG/3ML) 0.083% IN NEBU: 2.5000 mg | INHALATION_SOLUTION | Freq: Four times a day (QID) | RESPIRATORY_TRACT | Status: DC

## 2022-08-20 MED ORDER — IPRATROPIUM-ALBUTEROL 0.5-2.5 (3) MG/3ML IN SOLN
3.0000 mL | Freq: Once | RESPIRATORY_TRACT | Status: AC
  Administered 2022-08-20: 3 mL via RESPIRATORY_TRACT
  Filled 2022-08-20: qty 3

## 2022-08-20 MED ORDER — PREDNISONE 10 MG PO TABS: 40.0000 mg | ORAL_TABLET | Freq: Every day | ORAL | 0 refills | Status: AC

## 2022-08-20 MED ORDER — HYDRALAZINE HCL 20 MG/ML IJ SOLN: 5.0000 mg | INTRAMUSCULAR | Status: DC | PRN

## 2022-08-20 MED ORDER — MORPHINE SULFATE ER 30 MG PO TBCR
60.0000 mg | EXTENDED_RELEASE_TABLET | Freq: Two times a day (BID) | ORAL | Status: DC
  Administered 2022-08-20 – 2022-08-21 (×2): 60 mg via ORAL
  Filled 2022-08-20 (×2): qty 2

## 2022-08-20 MED ORDER — IPRATROPIUM BROMIDE 0.02 % IN SOLN: 0.5000 mg | Freq: Four times a day (QID) | RESPIRATORY_TRACT | Status: DC

## 2022-08-20 MED ORDER — ALBUTEROL SULFATE HFA 108 (90 BASE) MCG/ACT IN AERS
2.0000 | INHALATION_SPRAY | Freq: Once | RESPIRATORY_TRACT | Status: AC
  Administered 2022-08-20: 2 via RESPIRATORY_TRACT
  Filled 2022-08-20: qty 6.7

## 2022-08-20 MED ORDER — IBUPROFEN 400 MG PO TABS
600.0000 mg | ORAL_TABLET | Freq: Once | ORAL | Status: AC
  Administered 2022-08-20: 600 mg via ORAL
  Filled 2022-08-20: qty 2

## 2022-08-20 MED ORDER — TERIFLUNOMIDE 14 MG PO TABS: 1.0000 | ORAL_TABLET | Freq: Every day | ORAL | Status: DC

## 2022-08-20 MED ORDER — DULOXETINE HCL 60 MG PO CPEP
60.0000 mg | ORAL_CAPSULE | Freq: Two times a day (BID) | ORAL | Status: DC
  Administered 2022-08-20 – 2022-08-21 (×2): 60 mg via ORAL
  Filled 2022-08-20 (×2): qty 1

## 2022-08-20 MED ORDER — LOSARTAN POTASSIUM-HCTZ 50-12.5 MG PO TABS: 1.0000 | ORAL_TABLET | Freq: Every day | ORAL | Status: DC

## 2022-08-20 MED ORDER — TIZANIDINE HCL 4 MG PO TABS: 4.0000 mg | ORAL_TABLET | Freq: Four times a day (QID) | ORAL | Status: DC | PRN

## 2022-08-20 MED ORDER — ONDANSETRON HCL 4 MG PO TABS: 4.0000 mg | ORAL_TABLET | Freq: Four times a day (QID) | ORAL | Status: DC | PRN

## 2022-08-20 MED ORDER — LOSARTAN POTASSIUM 50 MG PO TABS
50.0000 mg | ORAL_TABLET | Freq: Every day | ORAL | Status: DC
  Administered 2022-08-21: 50 mg via ORAL
  Filled 2022-08-20: qty 1

## 2022-08-20 MED ORDER — GABAPENTIN 800 MG PO TABS
800.0000 mg | ORAL_TABLET | Freq: Four times a day (QID) | ORAL | Status: DC
  Filled 2022-08-20 (×3): qty 1

## 2022-08-20 MED ORDER — IOHEXOL 350 MG/ML SOLN
100.0000 mL | Freq: Once | INTRAVENOUS | Status: AC | PRN
  Administered 2022-08-20: 100 mL via INTRAVENOUS

## 2022-08-20 NOTE — Discharge Instructions (Signed)
Prescriptions for antibiotics were sent to your pharmacy.  Take these as prescribed starting tomorrow.  These antibiotics will cover the same bugs that the penicillin will.  You do not need to take the penicillin.  Additionally, you did have wheezing when you came to the ER.  Steroids can help reduce inflammation in your airways.  You did receive steroids today and an additional 4 days of steroids were sent to your pharmacy.  Take these as prescribed as well.  Use albuterol inhaler as needed.  Return to the emergency department at any time if you have any worsening of symptoms.

## 2022-08-20 NOTE — ED Notes (Signed)
ED TO INPATIENT HANDOFF REPORT  ED Nurse Name and Phone #: Apolonio Schneiders RN  S Name/Age/Gender Mark [name]. 62 y.o. male Room/Bed: APA06/APA06  Code Status   Code Status: Prior  Home/SNF/Other Home Patient oriented to: self, place, time, and situation Is this baseline? Yes   Triage Complete: Triage complete  Chief Complaint Acute respiratory failure ([place]) [J96.00]  Triage Note Pt. BIB [facility] complaining of trouble breathing a "bad cough" Pt. Did state his wife is positive for strep throat. EMS noted pt. Was 80% on room air Now 98% on 4L [place]. Pt. Also states a temp of 101 this morning.   Allergies No Known Allergies  Level of Care/Admitting Diagnosis ED Disposition     ED Disposition  Admit   Condition  --   [place]: [facility] [[id number]]  Level of Care: Med-Surg [16]  Covid Evaluation: Confirmed COVID Negative  Diagnosis: Acute respiratory failure ([place]) [518.81.ICD-9-CM]  Admitting Physician: Waldemar Dickens [phone]  Attending Physician: Marily Memos, DAVID J [[id number]]          B Medical/Surgery History Past Medical History:  Diagnosis Date   Anxiety    chronic pain treated with narcotics   Arthritis    Chronic pain    Constipation due to opioid therapy    COPD (chronic obstructive pulmonary disease) ([place])    Depression    Dysphonia    thoracic left sided"impingement" with heminumbness   Dyspnea    GI bleed    abt 15 years ago   Hypertension    Morbid obesity ([place])    MS (multiple sclerosis) ([place]) 02/11/2013   [name]   Neuromuscular disorder [facility]) 2014   Pneumonia    Sleep apnea    Sleep apnea with use of continuous positive airway pressure (CPAP) 02/11/2013   07-13-12 AHi of 98.6 /hr titrated to 11 cm water ,  average user time 4 hours and 4 minutes. Residual AHI 1.20 December 2012 .   Umbilical hernia    Past Surgical History:  Procedure Laterality Date   ANTERIOR CERVICAL DECOMP/DISCECTOMY FUSION N/A 09/29/2019    Procedure: Anterior Cervical Decompression Fusion - Cervical Four-Cervical Five;  Surgeon: [name], MD;  Location: [place];  Service: Neurosurgery;  Laterality: N/A;  Anterior Cervical Decompression Fusion - Cervical Four-Cervical Five    CARPAL TUNNEL RELEASE Right    [name] TUNNEL RELEASE Right 09/29/2019   Procedure: Carpal Tunnel Release;  Surgeon: [name], MD;  Location: [place];  Service: Neurosurgery;  Laterality: Right;   COLONOSCOPY W/ POLYPECTOMY     ESOPHAGOGASTRODUODENOSCOPY     MULTIPLE TOOTH EXTRACTIONS     POSTERIOR LUMBAR FUSION  04/17/2015   [name], also removed old hardware   SPINE SURGERY     total of six vertebras fused, three lumbar surgeries and one anterior neck fusion   TONSILLECTOMY       A IV Location/Drains/Wounds Patient Lines/Drains/Airways Status     Active Line/Drains/Airways     Name Placement date Placement time Site Days   Peripheral IV 08/20/22 20 G 1" Anterior;Left;Proximal Forearm 08/20/22  1110  Forearm  less than 1   Closed System Drain 1 Posterior Back Accordion (Hemovac) 04/17/15  1343  Back  2682   Closed System Drain 1 Midline Back Accordion (Hemovac) 15 Fr. 05/17/16  1451  Back  2286   Incision (Closed) 04/17/15 Back Other (Comment) 04/17/15  1125  -- 2682   Incision (Closed)  05/17/16 Back 05/17/16  1451  -- 2286   Incision (Closed) 09/29/19 Neck Other (Comment) 09/29/19  1039  -- 1056   Incision (Closed) 09/29/19 Hand Right 09/29/19  1048  -- 1056            Intake/Output Last 24 hours  Intake/Output Summary (Last 24 hours) at 08/20/2022 1933 Last data filed at 08/20/2022 1931 Gross per 24 hour  Intake 350.27 ml  Output --  Net 350.27 ml    Labs/Imaging Results for orders placed or performed during the hospital encounter of 08/20/22 (from the past 48 hour(s))  Comprehensive metabolic panel     Status: Abnormal   Collection Time: 08/20/22 10:58 AM  Result Value Ref Range   Sodium 133 (L) 135 - 145 mmol/L    Potassium 4.3 3.5 - 5.1 mmol/L   Chloride 91 (L) 98 - 111 mmol/L   CO2 31 22 - 32 mmol/L   Glucose, Bld 111 (H) 70 - 99 mg/dL    Comment: Glucose reference range applies only to samples taken after fasting for at least 8 hours.   BUN 16 6 - 20 mg/dL   Creatinine, Ser 0.79 0.61 - 1.24 mg/dL   Calcium 8.8 (L) 8.9 - 10.3 mg/dL   Total Protein 6.5 6.5 - 8.1 g/dL   Albumin 3.8 3.5 - 5.0 g/dL   AST 24 15 - 41 U/L   ALT 20 0 - 44 U/L   Alkaline Phosphatase 110 38 - 126 U/L   Total Bilirubin 0.6 0.3 - 1.2 mg/dL   GFR, Estimated >60 >60 mL/min    Comment: (NOTE) Calculated using the CKD-EPI Creatinine Equation (2021)    Anion gap 11 5 - 15    Comment: Performed at [facility], [address], [place], [place] [address]  Brain natriuretic peptide     Status: None   Collection Time: 08/20/22 10:58 AM  Result Value Ref Range   B Natriuretic Peptide 63.0 0.0 - 100.0 pg/mL    Comment: Performed at [facility], [address], [place], [place] [address]  Magnesium     Status: None   Collection Time: 08/20/22 10:58 AM  Result Value Ref Range   Magnesium 2.2 1.7 - 2.4 mg/dL    Comment: Performed at [facility], [address], [place], [place] [address]  CBC with Differential/Platelet     Status: Abnormal   Collection Time: 08/20/22 10:58 AM  Result Value Ref Range   WBC 4.5 4.0 - 10.5 K/uL   RBC 4.46 4.22 - 5.81 MIL/uL   Hemoglobin 12.4 (L) 13.0 - 17.0 g/dL   HCT 38.6 (L) 39.0 - 52.0 %   MCV 86.5 80.0 - 100.0 fL   MCH 27.8 26.0 - 34.0 pg   MCHC 32.1 30.0 - 36.0 g/dL   RDW 13.0 11.5 - 15.5 %   Platelets 161 150 - 400 K/uL   nRBC 0.0 0.0 - 0.2 %   Neutrophils Relative % 86 %   Neutro Abs 3.9 1.7 - 7.7 K/uL   Lymphocytes Relative 4 %   Lymphs Abs 0.2 (L) 0.7 - 4.0 K/uL   Monocytes Relative 7 %   Monocytes Absolute 0.3 0.1 - 1.0 K/uL   Eosinophils Relative 2 %   Eosinophils Absolute 0.1 0.0 - 0.5 K/uL   Basophils Relative 1 %   Basophils Absolute 0.0 0.0 - 0.1 K/uL   Immature  Granulocytes 0 %   Abs Immature Granulocytes 0.02 0.00 - 0.07 K/uL    Comment: Performed  at [facility], [address], [place], [place] [address]  D-dimer, quantitative     Status: Abnormal   Collection Time: 08/20/22 10:58 AM  Result Value Ref Range   D-Dimer, Quant 0.69 (H) 0.00 - 0.50 ug/mL-FEU    Comment: (NOTE) At the manufacturer cut-off value of 0.5 g/mL FEU, this assay has a negative predictive value of 95-100%.This assay is intended for use in conjunction with a clinical pretest probability (PTP) assessment model to exclude pulmonary embolism (PE) and deep venous thrombosis (DVT) in outpatients suspected of PE or DVT. Results should be correlated with clinical presentation. Performed at [facility], [address] [place], [place] [address]   Resp panel by RT-PCR (RSV, Flu A&B, Covid) Anterior Nasal Swab     Status: Abnormal   Collection Time: 08/20/22 11:10 AM   Specimen: Anterior Nasal Swab  Result Value Ref Range   SARS Coronavirus 2 by RT PCR NEGATIVE NEGATIVE    Comment: (NOTE) SARS-CoV-2 target nucleic acids are NOT DETECTED.  The SARS-CoV-2 RNA is generally detectable in upper respiratory specimens during the acute phase of infection. The lowest concentration of SARS-CoV-2 viral copies this assay can detect is 138 copies/mL. A negative result does not preclude SARS-Cov-2 infection and should not be used as the sole basis for treatment or other patient management decisions. A negative result may occur with  improper specimen collection/handling, submission of specimen other than nasopharyngeal swab, presence of viral mutation(s) within the areas targeted by this assay, and inadequate number of viral copies(<138 copies/mL). A negative result must be combined with clinical observations, patient history, and epidemiological information. The expected result is Negative.  Fact Sheet for Patients:  EntrepreneurPulse.com.au  Fact Sheet for  Healthcare Providers:  IncredibleEmployment.be  This test is no t yet approved or cleared by the [place] FDA and  has been authorized for detection and/or diagnosis of SARS-CoV-2 by FDA under an Emergency Use Authorization (EUA). This EUA will remain  in effect (meaning this test can be used) for the duration of the COVID-19 declaration under Section 564(b)(1) of the Act, 21 U.S.C.section 360bbb-3(b)(1), unless the authorization is terminated  or revoked sooner.       Influenza A by PCR POSITIVE (A) NEGATIVE   Influenza B by PCR NEGATIVE NEGATIVE    Comment: (NOTE) The Xpert Xpress SARS-CoV-2/FLU/RSV plus assay is intended as an aid in the diagnosis of influenza from Nasopharyngeal swab specimens and should not be used as a sole basis for treatment. Nasal washings and aspirates are unacceptable for Xpert Xpress SARS-CoV-2/FLU/RSV testing.  Fact Sheet for Patients: EntrepreneurPulse.com.au  Fact Sheet for Healthcare Providers: IncredibleEmployment.be  This test is not yet approved or cleared by the [place] FDA and has been authorized for detection and/or diagnosis of SARS-CoV-2 by FDA under an Emergency Use Authorization (EUA). This EUA will remain in effect (meaning this test can be used) for the duration of the COVID-19 declaration under Section 564(b)(1) of the Act, 21 U.S.C. section 360bbb-3(b)(1), unless the authorization is terminated or revoked.     Resp Syncytial Virus by PCR NEGATIVE NEGATIVE    Comment: (NOTE) Fact Sheet for Patients: EntrepreneurPulse.com.au  Fact Sheet for Healthcare Providers: IncredibleEmployment.be  This test is not yet approved or cleared by the [place] FDA and has been authorized for detection and/or diagnosis of SARS-CoV-2 by FDA under an Emergency Use Authorization (EUA). This EUA will remain in effect (meaning this test can be  used) for the duration of the COVID-19 declaration under  Section 564(b)(1) of the Act, 21 U.S.C. section 360bbb-3(b)(1), unless the authorization is terminated or revoked.  Performed at [facility], [address] [place], [place] [address]    CT Angio Chest PE W and/or Wo Contrast  Result Date: 08/20/2022 CLINICAL DATA:  Pulmonary embolism suspected. High probability. Wheezing and cough. EXAM: CT ANGIOGRAPHY CHEST WITH CONTRAST TECHNIQUE: Multidetector CT imaging of the chest was performed using the standard protocol during bolus administration of intravenous contrast. Multiplanar CT image reconstructions and MIPs were obtained to evaluate the vascular anatomy. RADIATION DOSE REDUCTION: This exam was performed according to the departmental dose-optimization program which includes automated exposure control, adjustment of the mA and/or kV according to patient size and/or use of iterative reconstruction technique. CONTRAST:  134m OMNIPAQUE IOHEXOL 350 MG/ML SOLN COMPARISON:  Chest radiography same day. Previous CT angiography 04/03/2022 FINDINGS: Cardiovascular: Heart size upper limits of normal. No visible coronary artery calcification. Minimal aortic atherosclerotic calcification. Maximal diameter ascending aorta 4.3 cm as seen previously. Pulmonary arterial opacification is satisfactory. No pulmonary emboli. Mediastinum/Nodes: No mass or adenopathy. Incidental azygous fissure. Lungs/Pleura: No pleural effusion on either side. There are hypoaerative changes at the right lung base most consistent with atelectasis or atelectatic pneumonia. I think this is more than just a poor inspiration. There was milder volume loss in this region in September of last year. No evidence of mass. Upper Abdomen: Diffuse fatty change of the liver.  No focal lesion. Musculoskeletal: Ordinary spinal degenerative changes. Previous thoracolumbar fusion surgery with pedicle screws and posterior rods. Review of the MIP images  confirms the above findings. IMPRESSION: 1. No pulmonary emboli. 2. Volume loss at the right lung base most consistent with atelectasis or atelectatic pneumonia. I think this is more than just a poor inspiration. There was milder volume loss in this region in September of last year. 3. Diffuse fatty change of the liver. 4. Stable ascending aorta diameter of 4.3 cm. Recommend annual imaging followup by CTA or MRA. This recommendation follows 2010 ACCF/AHA/AATS/ACR/ASA/SCA/SCAI/SIR/STS/SVM Guidelines for the Diagnosis and Management of Patients with Thoracic Aortic Disease. Circulation. 2010; 121:: V697-X480 Aortic aneurysm NOS (ICD10-I71.9) Electronically Signed   By: MNelson ChimesM.D.   On: 08/20/2022 12:54   DG Chest Port 1 View  Result Date: 08/20/2022 CLINICAL DATA:  Shortness of breath and cough EXAM: PORTABLE CHEST 1 VIEW COMPARISON:  06/12/2021 FINDINGS: Heart size upper limits of normal. Incidental azygous fissure on the right. Relative chronic elevation of the right hemidiaphragm with mild chronic volume loss at the right lung base. The lungs otherwise appear clear. Evidence of edema or effusion. IMPRESSION: Chronic elevation of the right hemidiaphragm with mild chronic volume loss at the right lung base. Electronically Signed   By: MNelson ChimesM.D.   On: 08/20/2022 10:39    Pending Labs Unresulted Labs (From admission, onward)    None       Vitals/Pain Today's Vitals   08/20/22 1500 08/20/22 1808 08/20/22 1840 08/20/22 1845  BP: 137/87     Pulse: 75     Resp: (!) 22     Temp:    98.4 F (36.9 C)  TempSrc:    Oral  SpO2: 99% 100%    Weight:      Height:      PainSc:   4      Isolation Precautions No active isolations  Medications Medications  methylPREDNISolone sodium succinate (SOLU-MEDROL) 125 mg/2 mL injection 125 mg (125 mg Intravenous Given 08/20/22 1111)  ipratropium-albuterol (DUONEB) 0.5-2.5 (  3) MG/3ML nebulizer solution 3 mL (3 mLs Nebulization Given 08/20/22 1137)   lactated ringers bolus 500 mL (0 mLs Intravenous Stopped 08/20/22 1259)  iohexol (OMNIPAQUE) 350 MG/ML injection 100 mL (100 mLs Intravenous Contrast Given 08/20/22 1233)  lactated ringers bolus 500 mL (0 mLs Intravenous Stopped 08/20/22 1442)  cefTRIAXone (ROCEPHIN) 1 g in sodium chloride 0.9 % 100 mL IVPB (0 g Intravenous Stopped 08/20/22 1552)  azithromycin (ZITHROMAX) 500 mg in sodium chloride 0.9 % 250 mL IVPB (0 mg Intravenous Stopped 08/20/22 1931)  ibuprofen (ADVIL) tablet 600 mg (600 mg Oral Given 08/20/22 1605)  albuterol (VENTOLIN HFA) 108 (90 Base) MCG/ACT inhaler 2 puff (2 puffs Inhalation Given 08/20/22 1808)  oxyCODONE-acetaminophen (PERCOCET/ROXICET) 5-325 MG per tablet 2 tablet (2 tablets Oral Given 08/20/22 1808)    Mobility walks       R Recommendations: See Admitting Provider Note  Report given to:   Additional Notes: Alert and oriented, uses urinal by standing at bedside.

## 2022-08-20 NOTE — ED Notes (Signed)
MD Dixon at bedside.

## 2022-08-20 NOTE — ED Notes (Addendum)
Patient's wife gave patient home dose of Aubagio 14 mg.

## 2022-08-20 NOTE — H&P (Signed)
History and Physical    Patient: Mark [name]. ZOX:[id number] DOB: 05-Aug-1960 DOA: 08/20/2022 DOS: the patient was seen and examined on 08/20/2022 PCP: Hali Marry, MD  Patient coming from: Home  Chief Complaint:  Chief Complaint  Patient presents with   Shortness of Breath   HPI: Mark [name]. is a 62 y.o. male with medical history significant of Depression/Anxiety, COPD, HTN, MS, OSA on CPAP  Night prior to admission pt developed fever to 101, general malaise, and SOB. O2 saturations in the am were in the mid to high 80s per pt and wife. Associated w/ productive cough. EMS was called and pt noted to have O2 at 80%. Placed on 10L w/ resolution of hypoxemia. Weaned to 4L by the time pt presented to [facility]. Multiple recent sick contacts. PT had several teeth pulled and still has sutures in place in mouth.   While in the ED pt received ABX, steroids and respirtory treatment. Initially pt was set to DC but continued to become hypoxic w/ ambulation while on RA.   Review of Systems: As mentioned in the history of present illness. All other systems reviewed and are negative. Past Medical History:  Diagnosis Date   Anxiety    chronic pain treated with narcotics   Arthritis    Chronic pain    Constipation due to opioid therapy    COPD (chronic obstructive pulmonary disease) (HCC)    Depression    Dysphonia    thoracic left sided"impingement" with heminumbness   Dyspnea    GI bleed    abt 15 years ago   Hypertension    Morbid obesity ([place])    MS (multiple sclerosis) ([place]) 02/11/2013   [name]   Neuromuscular disorder [facility]) 2014   Pneumonia    Sleep apnea    Sleep apnea with use of continuous positive airway pressure (CPAP) 02/11/2013   07-13-12 AHi of 98.6 /hr titrated to 11 cm water ,  average user time 4 hours and 4 minutes. Residual AHI 1.20 December 2012 .   Umbilical hernia    Past Surgical History:  Procedure Laterality Date   ANTERIOR  CERVICAL DECOMP/DISCECTOMY FUSION N/A 09/29/2019   Procedure: Anterior Cervical Decompression Fusion - Cervical Four-Cervical Five;  Surgeon: [name], MD;  Location: [place];  Service: Neurosurgery;  Laterality: N/A;  Anterior Cervical Decompression Fusion - Cervical Four-Cervical Five    CARPAL TUNNEL RELEASE Right    [name] TUNNEL RELEASE Right 09/29/2019   Procedure: Carpal Tunnel Release;  Surgeon: [name], MD;  Location: [place];  Service: Neurosurgery;  Laterality: Right;   COLONOSCOPY W/ POLYPECTOMY     ESOPHAGOGASTRODUODENOSCOPY     MULTIPLE TOOTH EXTRACTIONS     POSTERIOR LUMBAR FUSION  04/17/2015   [name], also removed old hardware   SPINE SURGERY     total of six vertebras fused, three lumbar surgeries and one anterior neck fusion   TONSILLECTOMY     Social History:  reports that he has never smoked. He has never used smokeless tobacco. He reports that he does not currently use alcohol. He reports that he does not use drugs.  No Known Allergies  Family History  Problem Relation Age of Onset   Arrhythmia Mother    Cancer Mother    Cancer Other    Cancer Other    Sleep walking Son    Obesity Sister     Prior to Admission medications  Medication Sig Start Date End Date Taking? Authorizing Provider  amantadine (SYMMETREL) 100 MG capsule Take 100 mg by mouth 3 (three) times daily.  07/05/19 08/20/22 Yes [provider]  amoxicillin-clavulanate (AUGMENTIN) 875-125 MG tablet Take 1 tablet by mouth every 12 (twelve) hours. Starting tomorrow 08/20/22  Yes Godfrey Pick, MD  amphetamine-dextroamphetamine (ADDERALL XR) 30 MG 24 hr capsule Take 30 mg by mouth every morning. 08/01/22  Yes [provider]  azithromycin (ZITHROMAX) 250 MG tablet Take 1 tablet (250 mg total) by mouth daily for 4 days. Starting tomorrow 08/20/22 08/24/22 Yes Godfrey Pick, MD  Cholecalciferol (VITAMIN D3) 125 MCG (5000 UT) TABS Take 1 tablet by mouth daily.   Yes [provider]  Docusate Sodium 100 MG capsule Take by mouth.   Yes [provider]  DULoxetine (CYMBALTA) 60 MG capsule Take 60 mg by mouth 2 (two) times daily.  01/25/13  Yes [provider]  famotidine (PEPCID) 20 MG tablet Take 1 tablet (20 mg total) by mouth 2 (two) times daily. 07/11/22  Yes Cobb, Karie Schwalbe, NP  fluticasone (FLONASE) 50 MCG/ACT nasal spray Place 1 spray into both nostrils daily. 06/19/22  Yes Cobb, Karie Schwalbe, NP  furosemide (LASIX) 20 MG tablet Take 1 tablet (20 mg total) by mouth daily as needed. 07/24/22  Yes Hali Marry, MD  gabapentin (NEURONTIN) 800 MG tablet Take 800 mg by mouth 4 (four) times daily. 04/25/16  Yes [provider]  HYDROcodone-acetaminophen (NORCO) 10-325 MG tablet Take 2 tablets by mouth every 4 (four) hours as needed for severe pain. 09/29/19  Yes Meyran, Ocie Cornfield, NP  ibuprofen (ADVIL,MOTRIN) 800 MG tablet Take 800 mg by mouth every 8 (eight) hours as needed for moderate pain.   Yes [provider]  loratadine (CLARITIN) 10 MG tablet Take by mouth.   Yes [provider]  losartan-hydrochlorothiazide (HYZAAR) 50-12.5 MG tablet Take 1 tablet by mouth daily. 07/24/22  Yes Hali Marry, MD  morphine (MS CONTIN) 60 MG 12 hr tablet Take 60 mg by mouth every 12 (twelve) hours. 07/29/22  Yes [provider]  penicillin v potassium (VEETID) 500 MG tablet Take 500 mg by mouth 4 (four) times daily. 08/19/22  Yes [provider]  predniSONE (DELTASONE) 10 MG tablet Take 4 tablets (40 mg total) by mouth daily for 4 days. Starting tomorrow 08/20/22 08/24/22 Yes Godfrey Pick, MD  promethazine-dextromethorphan (PROMETHAZINE-DM) 6.25-15 MG/5ML syrup Take 5 mLs by mouth. 05/06/22  Yes [provider]  tamsulosin (FLOMAX) 0.4 MG CAPS capsule Take 0.4 mg by mouth daily. 05/07/21  Yes [provider]  Teriflunomide (AUBAGIO) 14 MG TABS Take 1 tablet by mouth at bedtime. 08/19/22   Yes [provider]  tiZANidine (ZANAFLEX) 4 MG tablet Take 4 mg by mouth every 6 (six) hours as needed for muscle spasms.    Yes [provider]  oxyCODONE (OXY IR/ROXICODONE) 5 MG immediate release tablet Take 5 mg by mouth every 6 (six) hours as needed. Patient not taking: Reported on 08/20/2022 05/24/22   [provider]    Physical Exam: Vitals:   08/20/22 1441 08/20/22 1500 08/20/22 1808 08/20/22 1845  BP: 136/84 137/87    Pulse: 74 75    Resp: (!) 21 (!) 22    Temp: 99.1 F (37.3 C)   98.4 F (36.9 C)  TempSrc: Oral   Oral  SpO2: 100% 99% 100%   Weight:      Height:  General:  Appears calm and comfortable Eyes:  PERRL, EOMI, normal lids, iris ENT:  grossly normal hearing, lips & tongue, mmm Neck:  no LAD, masses or thyromegaly Cardiovascular:  RRR, no m/r/g. No LE edema.  Respiratory: Diffuse wheezing and ronchi w/ diminished breath sounds in the bases Abdomen:  soft, ntnd, NABS Skin:  no rash or induration seen on limited exam Musculoskeletal:  grossly normal tone BUE/BLE, good ROM, no bony abnormality Psychiatric:  grossly normal mood and affect, speech fluent and appropriate, AOx3 Neurologic:  CN 2-12 grossly intact, moves all extremities in coordinated fashion, sensation intact  Data Reviewed: CXR - Loss of pulmonary volume bilat R>L w/ increased bronchial markings ocnsistent w/ infectious process.  ECG - NSR, No ACS,   Assessment and Plan: Principal Problem:   Acute respiratory failure (HCC) Active Problems:   Depression with anxiety   Neurogenic bladder   MS (multiple sclerosis) (HCC)   OSA (obstructive sleep apnea)   Essential hypertension   BPH (benign prostatic hyperplasia)   Ascending aortic aneurysm (HCC)  ARF: likely from Flu in conjunction w/ possible COPD in exacerbation. Per pt no formal dx of COPD. CTA w/o evidence of CAP or PE. Less likely bacterial process due to nml WBC and negative CXR.  - Tamiflu - duonebs  Q6 - DC CTX, continue Azithro - consider repeat Prednisone in am  HTN: - continue Hyzaar  Ascending aortic aneurysm: 4.3cm on CTA.  - outpt mgt.   Chronic pain/MS: Chronic and stable - continue neurontin, Norco, MS Contin, Aubagio, Tizanidine  Neurogenic bladder: - continu eFlomax,   Depression/anxiety/ADHD - Continue Adderall, Cymbalta,    Advance Care Planning:   Code Status: Full Code FULL  Consults: none  Family Communication: wife  Severity of Illness: The appropriate patient status for this patient is OBSERVATION. Observation status is judged to be reasonable and necessary in order to provide the required intensity of service to ensure the patient's safety. The patient's presenting symptoms, physical exam findings, and initial radiographic and laboratory data in the context of their medical condition is felt to place them at decreased risk for further clinical deterioration. Furthermore, it is anticipated that the patient will be medically stable for discharge from the hospital within 2 midnights of admission.   Author: Waldemar Dickens, MD 08/20/2022 8:34 PM  For on call review [email or web address].

## 2022-08-20 NOTE — ED Notes (Signed)
Pt placed back on O2 because of oxygen sats of 85%.

## 2022-08-20 NOTE — ED Provider Notes (Addendum)
[facility] Provider Note   CSN: [id number] Arrival date & time: 08/20/22  1005     History  Chief Complaint  Patient presents with   Shortness of Breath    Mark Shough. is a 62 y.o. male.   Shortness of Breath Associated symptoms: cough and fever   Patient presents for shortness of breath.  Medical history includes MS, arthritis, obesity, HTN, BPH, ascending aortic aneurysm, sleep apnea.  He states that he has been told he has COPD "in a way".  He has not had any nebulizers to use at home.  He denies any smoking history.  Last night, he developed cough and shortness of breath.  He has had sick contacts at home.  Symptoms worsened this morning.  He reported a cough productive of phlegm that was gagging him.  He had worsening shortness of breath.  When fire department arrived on scene, they report that he was 80% SpO2 on room air.  He was placed on a nonrebreather at 10 L.  EMS weaned him down to 4 L and he was able to maintain normal SpO2.  Patient denies any new pain.  He does have chronic back pain.  He had a recent tooth pulled and states that he is scheduled to start antibiotics for that.  This morning, he did have a fever of 101.     Home Medications Prior to Admission medications   Medication Sig Start Date End Date Taking? Authorizing Provider  amoxicillin-clavulanate (AUGMENTIN) 875-125 MG tablet Take 1 tablet by mouth every 12 (twelve) hours. Starting tomorrow 08/20/22  Yes Godfrey Pick, MD  azithromycin (ZITHROMAX) 250 MG tablet Take 1 tablet (250 mg total) by mouth daily for 4 days. Starting tomorrow 08/20/22 08/24/22 Yes Godfrey Pick, MD  predniSONE (DELTASONE) 10 MG tablet Take 4 tablets (40 mg total) by mouth daily for 4 days. Starting tomorrow 08/20/22 08/24/22 Yes Godfrey Pick, MD  amantadine (SYMMETREL) 100 MG capsule Take 100 mg by mouth 3 (three) times daily.  07/05/19 08/17/21  [provider]  cyclobenzaprine  (FLEXERIL) 10 MG tablet Take 1 tablet (10 mg total) by mouth 3 (three) times daily as needed for muscle spasms. 09/29/19   Meyran, Ocie Cornfield, NP  DULoxetine (CYMBALTA) 60 MG capsule Take 60 mg by mouth 2 (two) times daily.  01/25/13   [provider]  famotidine (PEPCID) 20 MG tablet Take 1 tablet (20 mg total) by mouth 2 (two) times daily. 07/11/22   Cobb, Karie Schwalbe, NP  fluticasone (FLONASE) 50 MCG/ACT nasal spray Place 1 spray into both nostrils daily. 06/19/22   Cobb, Karie Schwalbe, NP  furosemide (LASIX) 20 MG tablet Take 1 tablet (20 mg total) by mouth daily as needed. 07/24/22   Hali Marry, MD  gabapentin (NEURONTIN) 800 MG tablet Take 800 mg by mouth 4 (four) times daily. 04/25/16   [provider]  HYDROcodone-acetaminophen (NORCO) 10-325 MG tablet Take 2 tablets by mouth every 4 (four) hours as needed for severe pain. 09/29/19   Meyran, Ocie Cornfield, NP  ibuprofen (ADVIL,MOTRIN) 800 MG tablet Take 800 mg by mouth every 8 (eight) hours as needed for moderate pain.    [provider]  losartan-hydrochlorothiazide (HYZAAR) 50-12.5 MG tablet Take 1 tablet by mouth daily. 07/24/22   Hali Marry, MD  morphine (MS CONTIN) 30 MG 12 hr tablet Take 1 tablet by mouth every 12 (twelve) hours. 05/04/20   Covington Thane Edu, PA-C  tamsulosin (FLOMAX) 0.4 MG CAPS capsule Take 0.4 mg by mouth daily. 05/07/21   [provider]  Teriflunomide 14 MG TABS Take 14 mg by mouth at bedtime.     [provider]  tiZANidine (ZANAFLEX) 4 MG tablet Take 4 mg by mouth every 6 (six) hours as needed for muscle spasms.     [provider]      Allergies    Patient has no known allergies.    Review of Systems   Review of Systems  Constitutional:  Positive for fatigue and fever.  Respiratory:  Positive for cough, chest tightness and shortness of breath.   All other systems reviewed and are negative.   Physical Exam Updated Vital  Signs BP 137/87   Pulse 75   Temp 99.1 F (37.3 C) (Oral)   Resp (!) 22   Ht '6\' 1"'$  (1.854 m)   Wt 136.1 kg   SpO2 99%   BMI 39.58 kg/m  Physical Exam Vitals and nursing note reviewed.  Constitutional:      General: He is not in acute distress.    Appearance: He is well-developed. He is not ill-appearing, toxic-appearing or diaphoretic.  HENT:     Head: Normocephalic and atraumatic.  Eyes:     Extraocular Movements: Extraocular movements intact.     Conjunctiva/sclera: Conjunctivae normal.  Cardiovascular:     Rate and Rhythm: Regular rhythm. Tachycardia present.     Heart sounds: No murmur heard. Pulmonary:     Effort: Pulmonary effort is normal. Tachypnea present. No accessory muscle usage or respiratory distress.     Breath sounds: Wheezing and rhonchi present. No decreased breath sounds or rales.  Chest:     Chest wall: No tenderness.  Abdominal:     Palpations: Abdomen is soft.     Tenderness: There is no abdominal tenderness.  Musculoskeletal:        General: No swelling.     Cervical back: Normal range of motion and neck supple.     Right lower leg: No edema.     Left lower leg: No edema.  Skin:    General: Skin is warm and dry.     Coloration: Skin is not cyanotic or pale.  Neurological:     General: No focal deficit present.     Mental Status: He is alert and oriented to person, place, and time.  Psychiatric:        Mood and Affect: Mood normal.        Behavior: Behavior normal.     ED Results / Procedures / Treatments   Labs (all labs ordered are listed, but only abnormal results are displayed) Labs Reviewed  RESP PANEL BY RT-PCR (RSV, FLU A&B, COVID)  RVPGX2 - Abnormal; Notable for the following components:      Result Value   Influenza A by PCR POSITIVE (*)    All other components within normal limits  COMPREHENSIVE METABOLIC PANEL - Abnormal; Notable for the following components:   Sodium 133 (*)    Chloride 91 (*)    Glucose, Bld 111 (*)     Calcium 8.8 (*)    All other components within normal limits  CBC WITH DIFFERENTIAL/PLATELET - Abnormal; Notable for the following components:   Hemoglobin 12.4 (*)    HCT 38.6 (*)    Lymphs Abs 0.2 (*)    All other components within normal limits  D-DIMER, QUANTITATIVE - Abnormal; Notable for the following components:   D-Dimer, Quant 0.69 (*)  All other components within normal limits  BRAIN NATRIURETIC PEPTIDE  MAGNESIUM    EKG EKG Interpretation  Date/Time:  Tuesday August 20 2022 15:08:05 EST Ventricular Rate:  77 PR Interval:  195 QRS Duration: 98 QT Interval:  381 QTC Calculation: 432 R Axis:   110 Text Interpretation: Sinus rhythm Low voltage, precordial leads Probable right ventricular hypertrophy Confirmed by Godfrey Pick [phone]) on 08/20/2022 4:04:21 PM  Radiology CT Angio Chest PE W and/or Wo Contrast  Result Date: 08/20/2022 CLINICAL DATA:  Pulmonary embolism suspected. High probability. Wheezing and cough. EXAM: CT ANGIOGRAPHY CHEST WITH CONTRAST TECHNIQUE: Multidetector CT imaging of the chest was performed using the standard protocol during bolus administration of intravenous contrast. Multiplanar CT image reconstructions and MIPs were obtained to evaluate the vascular anatomy. RADIATION DOSE REDUCTION: This exam was performed according to the departmental dose-optimization program which includes automated exposure control, adjustment of the mA and/or kV according to patient size and/or use of iterative reconstruction technique. CONTRAST:  178m OMNIPAQUE IOHEXOL 350 MG/ML SOLN COMPARISON:  Chest radiography same day. Previous CT angiography 04/03/2022 FINDINGS: Cardiovascular: Heart size upper limits of normal. No visible coronary artery calcification. Minimal aortic atherosclerotic calcification. Maximal diameter ascending aorta 4.3 cm as seen previously. Pulmonary arterial opacification is satisfactory. No pulmonary emboli. Mediastinum/Nodes: No mass or adenopathy.  Incidental azygous fissure. Lungs/Pleura: No pleural effusion on either side. There are hypoaerative changes at the right lung base most consistent with atelectasis or atelectatic pneumonia. I think this is more than just a poor inspiration. There was milder volume loss in this region in September of last year. No evidence of mass. Upper Abdomen: Diffuse fatty change of the liver.  No focal lesion. Musculoskeletal: Ordinary spinal degenerative changes. Previous thoracolumbar fusion surgery with pedicle screws and posterior rods. Review of the MIP images confirms the above findings. IMPRESSION: 1. No pulmonary emboli. 2. Volume loss at the right lung base most consistent with atelectasis or atelectatic pneumonia. I think this is more than just a poor inspiration. There was milder volume loss in this region in September of last year. 3. Diffuse fatty change of the liver. 4. Stable ascending aorta diameter of 4.3 cm. Recommend annual imaging followup by CTA or MRA. This recommendation follows 2010 ACCF/AHA/AATS/ACR/ASA/SCA/SCAI/SIR/STS/SVM Guidelines for the Diagnosis and Management of Patients with Thoracic Aortic Disease. Circulation. 2010; 121:: [id number] Aortic aneurysm NOS (ICD10-I71.9) Electronically Signed   By: MNelson ChimesM.D.   On: 08/20/2022 12:54   DG Chest Port 1 View  Result Date: 08/20/2022 CLINICAL DATA:  Shortness of breath and cough EXAM: PORTABLE CHEST 1 VIEW COMPARISON:  06/12/2021 FINDINGS: Heart size upper limits of normal. Incidental azygous fissure on the right. Relative chronic elevation of the right hemidiaphragm with mild chronic volume loss at the right lung base. The lungs otherwise appear clear. Evidence of edema or effusion. IMPRESSION: Chronic elevation of the right hemidiaphragm with mild chronic volume loss at the right lung base. Electronically Signed   By: MNelson ChimesM.D.   On: 08/20/2022 10:39    Procedures Procedures    Medications Ordered in ED Medications   albuterol (VENTOLIN HFA) 108 (90 Base) MCG/ACT inhaler 2 puff (has no administration in time range)  methylPREDNISolone sodium succinate (SOLU-MEDROL) 125 mg/2 mL injection 125 mg (125 mg Intravenous Given 08/20/22 1111)  ipratropium-albuterol (DUONEB) 0.5-2.5 (3) MG/3ML nebulizer solution 3 mL (3 mLs Nebulization Given 08/20/22 1137)  lactated ringers bolus 500 mL (0 mLs Intravenous Stopped 08/20/22 1259)  iohexol (OMNIPAQUE) 350 MG/ML injection  100 mL (100 mLs Intravenous Contrast Given 08/20/22 1233)  lactated ringers bolus 500 mL (0 mLs Intravenous Stopped 08/20/22 1442)  cefTRIAXone (ROCEPHIN) 1 g in sodium chloride 0.9 % 100 mL IVPB (0 g Intravenous Stopped 08/20/22 1552)  azithromycin (ZITHROMAX) 500 mg in sodium chloride 0.9 % 250 mL IVPB (500 mg Intravenous New Bag/Given 08/20/22 1556)  ibuprofen (ADVIL) tablet 600 mg (600 mg Oral Given 08/20/22 1605)    ED Course/ Medical Decision Making/ A&P                             Medical Decision Making Amount and/or Complexity of Data Reviewed Labs: ordered. Radiology: ordered.  Risk Prescription drug management. Decision regarding hospitalization.   This patient presents to the ED for concern of shortness of breath, this involves an extensive number of treatment options, and is a complaint that carries with it a high risk of complications and morbidity.  The differential diagnosis includes pneumonia, COPD exacerbation, CHF, pulmonary edema URI   Co morbidities that complicate the patient evaluation  MS, arthritis, obesity, HTN, BPH, ascending aortic aneurysm, sleep apnea   Additional history obtained:  Additional history obtained from EMS, patient's wife External records from outside source obtained and reviewed including EMR   Lab Tests:  I Ordered, and personally interpreted labs.  The pertinent results include: Baseline anemia, no leukocytosis, normal kidney function, normal electrolytes, mild elevation in D-dimer, normal BNP,  nasal swab positive for influenza A   Imaging Studies ordered:  I ordered imaging studies including chest x-ray, CTA chest I independently visualized and interpreted imaging which showed right lung base atelectasis versus pneumonia. I agree with the radiologist interpretation   Cardiac Monitoring: / EKG:  The patient was maintained on a cardiac monitor.  I personally viewed and interpreted the cardiac monitored which showed an underlying rhythm of: Sinus rhythm  Problem List / ED Course / Critical interventions / Medication management  Patient presents for cough and shortness of breath.  Onset of symptoms was last night and symptoms worsened today.  He also had a fever of 101 this morning.  Prior department reports SpO2 of 80% on room air on their arrival.  Patient arrives on 4 L of supplemental oxygen.  Currently, SpO2 is 100%.  Vital signs notable for tachycardia in the range of 115.  EKG shows sinus rhythm.  On lung auscultation, patient has diffuse end expiratory wheezing.  He states that he has been diagnosed with a reactive airway disease in the past but has not required any use of home nebulizers or inhalers.  IV fluids, Solu-Medrol, and breathing treatments were ordered.  Diagnostic workup was initiated.  When trialed off of oxygen, prior to his breathing treatment, SpO2 was 91%.  Following breathing treatments, patient was again taken off of supplemental oxygen and maintained SpO2 in the mid to high 90s.  Expiratory wheezing resolved on lung auscultation.  Tachycardia improved.  Workup was notable for influenza A positivity.  On CTA chest, no PE is identified.  He does have a small area of atelectasis versus pneumonia in right lung base.  Patient was offered admission due to his earlier hypoxia.  He declined this stating that he would wish to go home.  Patient to be treated for reactive airway disease exacerbation with antibiotics, steroids, and albuterol.  Inhaler was provided in the ED.   Prescriptions were sent to his pharmacy.  Patient was offered additional prescription for Tamiflu but  declined.  He was advised to return at any time if he changes his mind about admission or if he experiences any worsening of symptoms.  When off of oxygen, patient did have another drop in his SpO2.  He was placed back on oxygen and again encouraged to stay.  Patient continued to refuse admission, however, wife was able to talk him into it.  Patient to be admitted for further management. I ordered medication including Solu-Medrol, albuterol, antibiotics for reactive airway disease exacerbation; IV fluids for hydration Reevaluation of the patient after these medicines showed that the patient improved I have reviewed the patients home medicines and have made adjustments as needed   Social Determinants of Health:  Has PCP   Test / Admission - Considered:  Considered and offered admission, however, patient refused.  CRITICAL CARE Performed by: Godfrey Pick   Total critical care time: 32 minutes  Critical care time was exclusive of separately billable procedures and treating other patients.  Critical care was necessary to treat or prevent imminent or life-threatening deterioration.  Critical care was time spent personally by me on the following activities: development of treatment plan with patient and/or surrogate as well as nursing, discussions with consultants, evaluation of patient's response to treatment, examination of patient, obtaining history from patient or surrogate, ordering and performing treatments and interventions, ordering and review of laboratory studies, ordering and review of radiographic studies, pulse oximetry and re-evaluation of patient's condition.         Final Clinical Impression(s) / ED Diagnoses Final diagnoses:  COPD exacerbation ([place])  Influenza A  Acute respiratory failure with hypoxia ([place])    Rx / DC Orders ED Discharge Orders          Ordered     amoxicillin-clavulanate (AUGMENTIN) 875-125 MG tablet  Every 12 hours        08/20/22 1613    azithromycin (ZITHROMAX) 250 MG tablet  Daily        08/20/22 1613    predniSONE (DELTASONE) 10 MG tablet  Daily        08/20/22 1613                Godfrey Pick, MD 08/20/22 1719

## 2022-08-20 NOTE — ED Notes (Signed)
Admitting MD at bedside.

## 2022-08-20 NOTE — ED Triage Notes (Signed)
Pt. BIB [facility] complaining of trouble breathing a "bad cough" Pt. Did state his wife is positive for strep throat. EMS noted pt. Was 80% on room air Now 98% on 4L [place]. Pt. Also states a temp of 101 this morning.

## 2022-08-20 NOTE — Progress Notes (Signed)
Pt setup on a CPAP 11 with 2lpm cann bleed in with full face mask pt tolerating well

## 2022-08-21 ENCOUNTER — Other Ambulatory Visit: Payer: Self-pay

## 2022-08-21 ENCOUNTER — Encounter (HOSPITAL_COMMUNITY): Payer: Self-pay | Admitting: Family Medicine

## 2022-08-21 DIAGNOSIS — J9601 Acute respiratory failure with hypoxia: Secondary | ICD-10-CM | POA: Diagnosis not present

## 2022-08-21 LAB — COMPREHENSIVE METABOLIC PANEL
ALT: 19 U/L (ref 0–44)
AST: 24 U/L (ref 15–41)
Albumin: 3.3 g/dL — ABNORMAL LOW (ref 3.5–5.0)
Alkaline Phosphatase: 94 U/L (ref 38–126)
Anion gap: 7 (ref 5–15)
BUN: 20 mg/dL (ref 6–20)
CO2: 31 mmol/L (ref 22–32)
Calcium: 8.5 mg/dL — ABNORMAL LOW (ref 8.9–10.3)
Chloride: 96 mmol/L — ABNORMAL LOW (ref 98–111)
Creatinine, Ser: 0.76 mg/dL (ref 0.61–1.24)
GFR, Estimated: >60 mL/min (ref >60)
Glucose, Bld: 116 mg/dL — ABNORMAL HIGH (ref 70–99)
Potassium: 4.3 mmol/L (ref 3.5–5.1)
Sodium: 134 mmol/L — ABNORMAL LOW (ref 135–145)
Total Bilirubin: 0.5 mg/dL (ref 0.3–1.2)
Total Protein: 5.9 g/dL — ABNORMAL LOW (ref 6.5–8.1)

## 2022-08-21 LAB — CBC
HCT: 38.2 % — ABNORMAL LOW (ref 39.0–52.0)
Hemoglobin: 12.3 g/dL — ABNORMAL LOW (ref 13.0–17.0)
MCH: 27.7 pg (ref 26.0–34.0)
MCHC: 32.2 g/dL (ref 30.0–36.0)
MCV: 86 fL (ref 80.0–100.0)
Platelets: 167 10*3/uL (ref 150–400)
RBC: 4.44 MIL/uL (ref 4.22–5.81)
RDW: 13 % (ref 11.5–15.5)
WBC: 3.7 10*3/uL — ABNORMAL LOW (ref 4.0–10.5)
nRBC: 0 % (ref 0.0–0.2)

## 2022-08-21 LAB — HIV ANTIBODY (ROUTINE TESTING W REFLEX): HIV Screen 4th Generation wRfx: NONREACTIVE

## 2022-08-21 MED ORDER — VITAMIN C 500 MG PO TABS
500.0000 mg | ORAL_TABLET | Freq: Every day | ORAL | Status: DC
  Administered 2022-08-21: 500 mg via ORAL
  Filled 2022-08-21: qty 1

## 2022-08-21 MED ORDER — METHYLPREDNISOLONE SODIUM SUCC 125 MG IJ SOLR
125.0000 mg | Freq: Once | INTRAMUSCULAR | Status: AC
  Administered 2022-08-21: 125 mg via INTRAVENOUS
  Filled 2022-08-21: qty 2

## 2022-08-21 MED ORDER — PREDNISONE 20 MG PO TABS: 50.0000 mg | ORAL_TABLET | Freq: Every day | ORAL | Status: DC

## 2022-08-21 MED ORDER — ENOXAPARIN SODIUM 80 MG/0.8ML IJ SOSY: 65.0000 mg | PREFILLED_SYRINGE | INTRAMUSCULAR | Status: DC

## 2022-08-21 MED ORDER — AZITHROMYCIN 250 MG PO TABS
500.0000 mg | ORAL_TABLET | Freq: Every day | ORAL | Status: DC
  Administered 2022-08-21: 500 mg via ORAL
  Filled 2022-08-21: qty 2

## 2022-08-21 MED ORDER — AZITHROMYCIN 500 MG PO TABS: 500.0000 mg | ORAL_TABLET | Freq: Every day | ORAL | 0 refills | Status: AC

## 2022-08-21 MED ORDER — OSELTAMIVIR PHOSPHATE 75 MG PO CAPS: 75.0000 mg | ORAL_CAPSULE | Freq: Two times a day (BID) | ORAL | 0 refills | Status: AC

## 2022-08-21 MED ORDER — METHYLPREDNISOLONE 4 MG PO TBPK: ORAL_TABLET | ORAL | 0 refills | Status: DC

## 2022-08-21 MED ORDER — FUROSEMIDE 20 MG PO TABS: 20.0000 mg | ORAL_TABLET | Freq: Every day | ORAL | Status: DC

## 2022-08-21 NOTE — Progress Notes (Signed)
Vitals assessed this am, pt reports he had been off oxygen for approx 30 minutes prior to vitals assessed, sats 98% on room air.

## 2022-08-21 NOTE — Discharge Summary (Signed)
Physician Discharge Summary   Patient: Mark Maynez White Jr. MRN: [id number] DOB: [birth year]  Admit date:     08/20/2022  Discharge date: 08/21/22  Discharge Physician: Deatra James   PCP: Hali Marry, MD   Recommendations at discharge:    Follow up with PCP in 1-2 week   Discharge Diagnoses: Principal Problem:   Acute respiratory failure ([place]) Active Problems:   Depression with anxiety   Neurogenic bladder   MS (multiple sclerosis) (HCC)   OSA (obstructive sleep apnea)   Essential hypertension   BPH (benign prostatic hyperplasia)   Ascending aortic aneurysm (HCC)  Resolved Problems:   * No resolved hospital problems. *  Hospital Course: Mark [name]. is a 62 y.o. male with medical history significant of Depression/Anxiety, COPD, HTN, MS, OSA on CPAP   Night prior to admission pt developed fever to 101, general malaise, and SOB. O2 saturations in the am were in the mid to high 80s per pt and wife. Associated w/ productive cough. EMS was called and pt noted to have O2 at 80%. Placed on 10L w/ resolution of hypoxemia. Weaned to 4L by the time pt presented to [facility]. Multiple recent sick contacts.  PTA had several teeth pulled and still has sutures in place in mouth.    While in the ED pt received ABX, steroids and respirtory treatment. Initially pt was set to DC but continued to become hypoxic w/ ambulation while on RA.    Assessment and Plan: Principal Problem:   Acute respiratory failure (HCC) Active Problems:   Depression with anxiety   Neurogenic bladder   MS (multiple sclerosis) (HCC)   OSA (obstructive sleep apnea)   Essential hypertension   BPH (benign prostatic hyperplasia)   Ascending aortic aneurysm (HCC)     Acute respiratory failure - Influenza A - much improved --- Off supplement Oxygen  Satting 95% on RA  - likely from Flu A in conjunction w/ possible COPD in exacerbation. Per pt no formal dx of COPD. CTA w/o evidence of CAP or  PE. Less likely bacterial process due to nml WBC and negative CXR.  - Tamiflu cont  - duonebs Q6 - DC CTX, continue Azithro - Continue Prednisone PO    HTN: - continue Hyzaar   Ascending aortic aneurysm: 4.3cm on CTA.  - outpt mgt.    Chronic pain/MS: Chronic and stable - continue neurontin, Norco, MS Contin, Aubagio, Tizanidine   Neurogenic bladder: - continue Flomax,    Depression/anxiety/ADHD - Continue Adderall, Cymbalta,      Disposition: Home Diet recommendation:  Discharge Diet Orders (From admission, onward)     Start     Ordered   08/21/22 0000  Diet - low sodium heart healthy        08/21/22 0950           Cardiac diet DISCHARGE MEDICATION: Allergies as of 08/21/2022   No Known Allergies      Medication List     STOP taking these medications    oxyCODONE 5 MG immediate release tablet Commonly known as: Oxy IR/ROXICODONE       TAKE these medications    amantadine 100 MG capsule Commonly known as: SYMMETREL Take 100 mg by mouth 3 (three) times daily.   amoxicillin-clavulanate 875-125 MG tablet Commonly known as: AUGMENTIN Take 1 tablet by mouth every 12 (twelve) hours. Starting tomorrow   amphetamine-dextroamphetamine 30 MG 24 hr capsule Commonly known as: ADDERALL XR Take 30 mg by mouth every morning.  Aubagio 14 MG Tabs Generic drug: Teriflunomide Take 1 tablet by mouth at bedtime.   azithromycin 250 MG tablet Commonly known as: ZITHROMAX Take 1 tablet (250 mg total) by mouth daily for 4 days. Starting tomorrow   azithromycin 500 MG tablet Commonly known as: Zithromax Take 1 tablet (500 mg total) by mouth daily for 4 days. Take 1 tablet daily for 3 days.   Docusate Sodium 100 MG capsule Take by mouth.   DULoxetine 60 MG capsule Commonly known as: CYMBALTA Take 60 mg by mouth 2 (two) times daily.   famotidine 20 MG tablet Commonly known as: PEPCID Take 1 tablet (20 mg total) by mouth 2 (two) times daily.   fluticasone  50 MCG/ACT nasal spray Commonly known as: FLONASE Place 1 spray into both nostrils daily.   furosemide 20 MG tablet Commonly known as: LASIX Take 1 tablet (20 mg total) by mouth daily as needed.   gabapentin 800 MG tablet Commonly known as: NEURONTIN Take 800 mg by mouth 4 (four) times daily.   HYDROcodone-acetaminophen 10-325 MG tablet Commonly known as: NORCO Take 2 tablets by mouth every 4 (four) hours as needed for severe pain.   ibuprofen 800 MG tablet Commonly known as: ADVIL Take 800 mg by mouth every 8 (eight) hours as needed for moderate pain.   loratadine 10 MG tablet Commonly known as: CLARITIN Take by mouth.   losartan-hydrochlorothiazide 50-12.5 MG tablet Commonly known as: HYZAAR Take 1 tablet by mouth daily.   methylPREDNISolone 4 MG Tbpk tablet Commonly known as: MEDROL DOSEPAK Medrol Dosepak take as instructed   morphine 60 MG 12 hr tablet Commonly known as: MS CONTIN Take 60 mg by mouth every 12 (twelve) hours.   oseltamivir 75 MG capsule Commonly known as: TAMIFLU Take 1 capsule (75 mg total) by mouth 2 (two) times daily for 4 days.   penicillin v potassium 500 MG tablet Commonly known as: VEETID Take 500 mg by mouth 4 (four) times daily.   predniSONE 10 MG tablet Commonly known as: DELTASONE Take 4 tablets (40 mg total) by mouth daily for 4 days. Starting tomorrow   promethazine-dextromethorphan 6.25-15 MG/5ML syrup Commonly known as: PROMETHAZINE-DM Take 5 mLs by mouth.   tamsulosin 0.4 MG Caps capsule Commonly known as: FLOMAX Take 0.4 mg by mouth daily.   tiZANidine 4 MG tablet Commonly known as: ZANAFLEX Take 4 mg by mouth every 6 (six) hours as needed for muscle spasms.   Vitamin D3 125 MCG (5000 UT) Tabs Take 1 tablet by mouth daily.        Discharge Exam: Filed Weights   08/20/22 1025 08/20/22 2200  Weight: 136.1 kg 134.1 kg        General:  AAO x 3,  cooperative, no distress;   HEENT:  Normocephalic, PERRL,  otherwise with in Normal limits   Neuro:  CNII-XII intact. , normal motor and sensation, reflexes intact   Lungs:   Clear to auscultation BL, Respirations unlabored,  + wheezes / No crackles  Cardio:    S1/S2, RRR, No murmure, No Rubs or Gallops   Abdomen:  Soft, non-tender, bowel sounds active all four quadrants, no guarding or peritoneal signs.  Muscular  skeletal:  Limited exam -global generalized weaknesses - in bed, able to move all 4 extremities,   2+ pulses,  symmetric, No pitting edema  Skin:  Dry, warm to touch, negative for any Rashes,  Wounds: Please see nursing documentation          Condition  at discharge: fair  The results of significant diagnostics from this hospitalization (including imaging, microbiology, ancillary and laboratory) are listed below for reference.   Imaging Studies: CT Angio Chest PE W and/or Wo Contrast  Result Date: 08/20/2022 CLINICAL DATA:  Pulmonary embolism suspected. High probability. Wheezing and cough. EXAM: CT ANGIOGRAPHY CHEST WITH CONTRAST TECHNIQUE: Multidetector CT imaging of the chest was performed using the standard protocol during bolus administration of intravenous contrast. Multiplanar CT image reconstructions and MIPs were obtained to evaluate the vascular anatomy. RADIATION DOSE REDUCTION: This exam was performed according to the departmental dose-optimization program which includes automated exposure control, adjustment of the mA and/or kV according to patient size and/or use of iterative reconstruction technique. CONTRAST:  150m OMNIPAQUE IOHEXOL 350 MG/ML SOLN COMPARISON:  Chest radiography same day. Previous CT angiography 04/03/2022 FINDINGS: Cardiovascular: Heart size upper limits of normal. No visible coronary artery calcification. Minimal aortic atherosclerotic calcification. Maximal diameter ascending aorta 4.3 cm as seen previously. Pulmonary arterial opacification is satisfactory. No pulmonary emboli. Mediastinum/Nodes: No  mass or adenopathy. Incidental azygous fissure. Lungs/Pleura: No pleural effusion on either side. There are hypoaerative changes at the right lung base most consistent with atelectasis or atelectatic pneumonia. I think this is more than just a poor inspiration. There was milder volume loss in this region in September of last year. No evidence of mass. Upper Abdomen: Diffuse fatty change of the liver.  No focal lesion. Musculoskeletal: Ordinary spinal degenerative changes. Previous thoracolumbar fusion surgery with pedicle screws and posterior rods. Review of the MIP images confirms the above findings. IMPRESSION: 1. No pulmonary emboli. 2. Volume loss at the right lung base most consistent with atelectasis or atelectatic pneumonia. I think this is more than just a poor inspiration. There was milder volume loss in this region in September of last year. 3. Diffuse fatty change of the liver. 4. Stable ascending aorta diameter of 4.3 cm. Recommend annual imaging followup by CTA or MRA. This recommendation follows 2010 ACCF/AHA/AATS/ACR/ASA/SCA/SCAI/SIR/STS/SVM Guidelines for the Diagnosis and Management of Patients with Thoracic Aortic Disease. Circulation. 2010; 121:: C947-S962 Aortic aneurysm NOS (ICD10-I71.9) Electronically Signed   By: MNelson ChimesM.D.   On: 08/20/2022 12:54   DG Chest Port 1 View  Result Date: 08/20/2022 CLINICAL DATA:  Shortness of breath and cough EXAM: PORTABLE CHEST 1 VIEW COMPARISON:  06/12/2021 FINDINGS: Heart size upper limits of normal. Incidental azygous fissure on the right. Relative chronic elevation of the right hemidiaphragm with mild chronic volume loss at the right lung base. The lungs otherwise appear clear. Evidence of edema or effusion. IMPRESSION: Chronic elevation of the right hemidiaphragm with mild chronic volume loss at the right lung base. Electronically Signed   By: MNelson ChimesM.D.   On: 08/20/2022 10:39    Microbiology: Results for orders placed or performed  during the hospital encounter of 08/20/22  Resp panel by RT-PCR (RSV, Flu A&B, Covid) Anterior Nasal Swab     Status: Abnormal   Collection Time: 08/20/22 11:10 AM   Specimen: Anterior Nasal Swab  Result Value Ref Range Status   SARS Coronavirus 2 by RT PCR NEGATIVE NEGATIVE Final    Comment: (NOTE) SARS-CoV-2 target nucleic acids are NOT DETECTED.  The SARS-CoV-2 RNA is generally detectable in upper respiratory specimens during the acute phase of infection. The lowest concentration of SARS-CoV-2 viral copies this assay can detect is 138 copies/mL. A negative result does not preclude SARS-Cov-2 infection and should not be used as the sole basis for treatment or  other patient management decisions. A negative result may occur with  improper specimen collection/handling, submission of specimen other than nasopharyngeal swab, presence of viral mutation(s) within the areas targeted by this assay, and inadequate number of viral copies(<138 copies/mL). A negative result must be combined with clinical observations, patient history, and epidemiological information. The expected result is Negative.  Fact Sheet for Patients:  EntrepreneurPulse.com.au  Fact Sheet for Healthcare Providers:  IncredibleEmployment.be  This test is no t yet approved or cleared by the [place] FDA and  has been authorized for detection and/or diagnosis of SARS-CoV-2 by FDA under an Emergency Use Authorization (EUA). This EUA will remain  in effect (meaning this test can be used) for the duration of the COVID-19 declaration under Section 564(b)(1) of the Act, 21 U.S.C.section 360bbb-3(b)(1), unless the authorization is terminated  or revoked sooner.       Influenza A by PCR POSITIVE (A) NEGATIVE Final   Influenza B by PCR NEGATIVE NEGATIVE Final    Comment: (NOTE) The Xpert Xpress SARS-CoV-2/FLU/RSV plus assay is intended as an aid in the diagnosis of influenza from  Nasopharyngeal swab specimens and should not be used as a sole basis for treatment. Nasal washings and aspirates are unacceptable for Xpert Xpress SARS-CoV-2/FLU/RSV testing.  Fact Sheet for Patients: EntrepreneurPulse.com.au  Fact Sheet for Healthcare Providers: IncredibleEmployment.be  This test is not yet approved or cleared by the [place] FDA and has been authorized for detection and/or diagnosis of SARS-CoV-2 by FDA under an Emergency Use Authorization (EUA). This EUA will remain in effect (meaning this test can be used) for the duration of the COVID-19 declaration under Section 564(b)(1) of the Act, 21 U.S.C. section 360bbb-3(b)(1), unless the authorization is terminated or revoked.     Resp Syncytial Virus by PCR NEGATIVE NEGATIVE Final    Comment: (NOTE) Fact Sheet for Patients: EntrepreneurPulse.com.au  Fact Sheet for Healthcare Providers: IncredibleEmployment.be  This test is not yet approved or cleared by the [place] FDA and has been authorized for detection and/or diagnosis of SARS-CoV-2 by FDA under an Emergency Use Authorization (EUA). This EUA will remain in effect (meaning this test can be used) for the duration of the COVID-19 declaration under Section 564(b)(1) of the Act, 21 U.S.C. section 360bbb-3(b)(1), unless the authorization is terminated or revoked.  Performed at [facility], [address], [place], [place] [address]     Labs: CBC: Recent Labs  Lab 08/20/22 1058 08/21/22 0455  WBC 4.5 3.7*  NEUTROABS 3.9  --   HGB 12.4* 12.3*  HCT 38.6* 38.2*  MCV 86.5 86.0  PLT 161 967   Basic Metabolic Panel: Recent Labs  Lab 08/20/22 1058 08/21/22 0455  NA 133* 134*  K 4.3 4.3  CL 91* 96*  CO2 31 31  GLUCOSE 111* 116*  BUN 16 20  CREATININE 0.79 0.76  CALCIUM 8.8* 8.5*  MG 2.2  --    Liver Function Tests: Recent Labs  Lab 08/20/22 1058 08/21/22 0455   AST 24 24  ALT 20 19  ALKPHOS 110 94  BILITOT 0.6 0.5  PROT 6.5 5.9*  ALBUMIN 3.8 3.3*   CBG: No results for input(s): "GLUCAP" in the last 168 hours.  Discharge time spent: greater than 30 minutes.  Signed: Deatra James, MD [facility] 08/21/2022

## 2022-08-21 NOTE — Progress Notes (Signed)
  Transition of Care [facility]) Screening Note   Patient Details  Name: Mark [name]. Date of Birth: June 24, 1961   Transition of Care [facility]) CM/SW Contact:    Ihor Gully, LCSW Phone Number: 08/21/2022, 10:20 AM    Transition of Care Department [facility]) has reviewed patient and no TOC needs have been identified at this time. We will continue to monitor patient advancement through interdisciplinary progression rounds. If new patient transition needs arise, please place a TOC consult.

## 2022-08-21 NOTE — Hospital Course (Signed)
Mark [name]. is a 62 y.o. male with medical history significant of Depression/Anxiety, COPD, HTN, MS, OSA on CPAP   Night prior to admission pt developed fever to 101, general malaise, and SOB. O2 saturations in the am were in the mid to high 80s per pt and wife. Associated w/ productive cough. EMS was called and pt noted to have O2 at 80%. Placed on 10L w/ resolution of hypoxemia. Weaned to 4L by the time pt presented to [facility]. Multiple recent sick contacts.  PTA had several teeth pulled and still has sutures in place in mouth.    While in the ED pt received ABX, steroids and respirtory treatment. Initially pt was set to DC but continued to become hypoxic w/ ambulation while on RA.    Assessment and Plan: Principal Problem:   Acute respiratory failure (HCC) Active Problems:   Depression with anxiety   Neurogenic bladder   MS (multiple sclerosis) (HCC)   OSA (obstructive sleep apnea)   Essential hypertension   BPH (benign prostatic hyperplasia)   Ascending aortic aneurysm (HCC)     Acute respiratory failure - Influenza A - likely from Flu A in conjunction w/ possible COPD in exacerbation. Per pt no formal dx of COPD. CTA w/o evidence of CAP or PE. Less likely bacterial process due to nml WBC and negative CXR.  - Tamiflu - duonebs Q6 - DC CTX, continue Azithro - Continue Prednisone PO    HTN: - continue Hyzaar   Ascending aortic aneurysm: 4.3cm on CTA.  - outpt mgt.    Chronic pain/MS: Chronic and stable - continue neurontin, Norco, MS Contin, Aubagio, Tizanidine   Neurogenic bladder: - continue Flomax,    Depression/anxiety/ADHD - Continue Adderall, Cymbalta,

## 2022-08-21 NOTE — Progress Notes (Signed)
Pt has been resting since arrival to floor.  Able to ambulate with cane which is his baseline.  Prn hydrocodone x 1 for chronic back pain.  Pt on 2L O2 but able to tolerated ambulating, sitting in bathroom on room air, sats 96% after activity.  CPAP on at night.

## 2022-08-21 NOTE — Progress Notes (Signed)
IV removed, discharge instructions reviewed and scripts sent to pharmacy.  O2 sat on room air was 95 and did not desat walking.  Wife on her way to give ride home.

## 2022-08-23 ENCOUNTER — Ambulatory Visit (INDEPENDENT_AMBULATORY_CARE_PROVIDER_SITE_OTHER): Payer: Medicare Other | Admitting: Family Medicine

## 2022-08-23 DIAGNOSIS — Z0000 Encounter for general adult medical examination without abnormal findings: Secondary | ICD-10-CM | POA: Diagnosis not present

## 2022-08-23 NOTE — Progress Notes (Signed)
MEDICARE ANNUAL WELLNESS VISIT  08/23/2022  Telephone Visit Disclaimer This Medicare AWV was conducted by telephone due to national recommendations for restrictions regarding the COVID-19 Pandemic (e.g. social distancing).  I verified, using two identifiers, that I am speaking with Mark [name]. or their authorized healthcare agent. I discussed the limitations, risks, security, and privacy concerns of performing an evaluation and management service by telephone and the potential availability of an in-person appointment in the future. The patient expressed understanding and agreed to proceed.  Location of Patient: Home Location of Provider (nurse):  In the office.  Subjective:    Mark [name]. is a 62 y.o. male patient of [name], Mark Kocher, MD who had a Medicare Annual Wellness Visit today via telephone. Mark [name] is Disabled and lives with their spouse. he has 2 children. he reports that he is socially active and does interact with friends/family regularly. he is minimally physically active and enjoys playing video games.  Patient Care Team: Hali Marry, MD as PCP - General (Family Medicine) Stanford Breed Denice Bors, MD as PCP - Cardiology (Cardiology) [name], MD as Consulting Physician (Neurosurgery) Dr. Hyman Bower (Neurology) Ollen Bowl, MD (Urology) Dohmeier, Asencion Partridge, MD as Consulting Physician (Neurology) Lucas Mallow, MD as Consulting Physician (Urology)     08/23/2022    3:37 PM 08/20/2022   10:00 PM 08/17/2021    3:04 PM 09/27/2019   11:23 AM 06/21/2019   11:07 AM 05/18/2016    2:00 AM 05/14/2016    2:36 PM  Advanced Directives  Does Patient Have a Medical Advance Directive? Yes Yes Yes Yes Yes Yes Yes  Type of Advance Directive Living will [place];Living will Living will [place];Living will [place];Living will [place];Living will [place];Living will  Does patient want to make changes to medical advance directive? No - Patient declined No - Patient declined No - Patient declined  No - Patient declined No - Patient declined   Copy of [place] in Chart? No - copy requested No - copy requested  Yes - validated most recent copy scanned in chart (See row information) No - copy requested No - copy requested No - copy requested  Would patient like information on creating a medical advance directive? No - Patient declined          Hospital Utilization Over the Past 12 Months: # of hospitalizations or ER visits: 1 # of surgeries: 2  Review of Systems    Patient reports that his overall health is worse compared to last year.  History obtained from chart review and the patient  Patient Reported Readings (BP, Pulse, CBG, Weight, etc) none  Pain Assessment Pain : 0-10 Pain Score: 6  Pain Type: Chronic pain Pain Location: Knee Pain Orientation: Right, Left Pain Descriptors / Indicators: Constant Pain Onset: More than a month ago Pain Frequency: Constant     Current Medications & Allergies (verified) Allergies as of 08/23/2022   No Known Allergies      Medication List        Accurate as of August 23, 2022  3:42 PM. If you have any questions, ask your nurse or doctor.          amantadine 100 MG capsule Commonly known as: SYMMETREL Take 100 mg by mouth 3 (three) times daily.   amoxicillin-clavulanate 875-125 MG tablet Commonly known as: AUGMENTIN Take 1 tablet by mouth  every 12 (twelve) hours. Starting tomorrow   amphetamine-dextroamphetamine 30 MG 24 hr capsule Commonly known as: ADDERALL XR Take 30 mg by mouth every morning.   Aubagio 14 MG Tabs Generic drug: Teriflunomide Take 1 tablet by mouth at bedtime.   azithromycin 250 MG tablet Commonly known as: ZITHROMAX Take 1 tablet (250 mg total) by mouth daily for 4 days. Starting tomorrow   azithromycin 500 MG  tablet Commonly known as: Zithromax Take 1 tablet (500 mg total) by mouth daily for 4 days. Take 1 tablet daily for 3 days.   Docusate Sodium 100 MG capsule Take by mouth.   DULoxetine 60 MG capsule Commonly known as: CYMBALTA Take 60 mg by mouth 2 (two) times daily.   famotidine 20 MG tablet Commonly known as: PEPCID Take 1 tablet (20 mg total) by mouth 2 (two) times daily.   fluticasone 50 MCG/ACT nasal spray Commonly known as: FLONASE Place 1 spray into both nostrils daily.   furosemide 20 MG tablet Commonly known as: LASIX Take 1 tablet (20 mg total) by mouth daily as needed.   gabapentin 800 MG tablet Commonly known as: NEURONTIN Take 800 mg by mouth 4 (four) times daily.   HYDROcodone-acetaminophen 10-325 MG tablet Commonly known as: NORCO Take 2 tablets by mouth every 4 (four) hours as needed for severe pain.   ibuprofen 800 MG tablet Commonly known as: ADVIL Take 800 mg by mouth every 8 (eight) hours as needed for moderate pain.   loratadine 10 MG tablet Commonly known as: CLARITIN Take by mouth.   losartan-hydrochlorothiazide 50-12.5 MG tablet Commonly known as: HYZAAR Take 1 tablet by mouth daily.   methylPREDNISolone 4 MG Tbpk tablet Commonly known as: MEDROL DOSEPAK Medrol Dosepak take as instructed   morphine 60 MG 12 hr tablet Commonly known as: MS CONTIN Take 60 mg by mouth every 12 (twelve) hours.   oseltamivir 75 MG capsule Commonly known as: TAMIFLU Take 1 capsule (75 mg total) by mouth 2 (two) times daily for 4 days.   penicillin v potassium 500 MG tablet Commonly known as: VEETID Take 500 mg by mouth 4 (four) times daily.   predniSONE 10 MG tablet Commonly known as: DELTASONE Take 4 tablets (40 mg total) by mouth daily for 4 days. Starting tomorrow   promethazine-dextromethorphan 6.25-15 MG/5ML syrup Commonly known as: PROMETHAZINE-DM Take 5 mLs by mouth.   tamsulosin 0.4 MG Caps capsule Commonly known as: FLOMAX Take 0.4 mg by  mouth daily.   tiZANidine 4 MG tablet Commonly known as: ZANAFLEX Take 4 mg by mouth every 6 (six) hours as needed for muscle spasms.   Vitamin D3 125 MCG (5000 UT) Tabs Take 1 tablet by mouth daily.        History (reviewed): Past Medical History:  Diagnosis Date   Anxiety    chronic pain treated with narcotics   Arthritis    Chronic pain    Constipation due to opioid therapy    COPD (chronic obstructive pulmonary disease) (HCC)    Depression    Dysphonia    thoracic left sided"impingement" with heminumbness   Dyspnea    GI bleed    abt 15 years ago   Hypertension    Morbid obesity ([place])    MS (multiple sclerosis) ([place]) 02/11/2013   [name]   Neuromuscular disorder [facility]) 2014   Pneumonia    Sleep apnea    Sleep apnea with use of continuous positive airway pressure (CPAP) 02/11/2013   07-13-12  AHi of 98.6 /hr titrated to 11 cm water ,  average user time 4 hours and 4 minutes. Residual AHI 1.20 December 2012 .   Umbilical hernia    Past Surgical History:  Procedure Laterality Date   ANTERIOR CERVICAL DECOMP/DISCECTOMY FUSION N/A 09/29/2019   Procedure: Anterior Cervical Decompression Fusion - Cervical Four-Cervical Five;  Surgeon: [name], MD;  Location: [facility];  Service: Neurosurgery;  Laterality: N/A;  Anterior Cervical Decompression Fusion - Cervical Four-Cervical Five    CARPAL TUNNEL RELEASE Right    [name] TUNNEL RELEASE Right 09/29/2019   Procedure: Carpal Tunnel Release;  Surgeon: [name], MD;  Location: [facility];  Service: Neurosurgery;  Laterality: Right;   COLONOSCOPY W/ POLYPECTOMY     ESOPHAGOGASTRODUODENOSCOPY     MULTIPLE TOOTH EXTRACTIONS     POSTERIOR LUMBAR FUSION  04/17/2015   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 They have two children. He enjoys playing video games.      Social Determinants of Health   Financial Resource Strain: Low Risk  (08/23/2022)   Overall Financial Resource Strain (CARDIA)    Difficulty of Paying Living Expenses: Not hard at all  Food Insecurity: No Food Insecurity (08/23/2022)   Hunger Vital Sign    Worried About Running Out of Food in the Last Year: Never true    Ran Out of Food in the Last Year: Never true  Transportation Needs: No Transportation Needs (08/23/2022)   PRAPARE - Hydrologist (Medical): No    Lack of Transportation (Non-Medical): No  Physical Activity: Inactive (08/23/2022)   Exercise Vital Sign    Days of Exercise per Week: 0 days    Minutes of Exercise per Session: 0 min  Stress: No Stress Concern Present (08/23/2022)   [place]    Feeling of Stress : Not at all  Social Connections: Moderately Integrated (08/23/2022)   Social Connection and Isolation Panel [NHANES]    Frequency of Communication with Friends and Family: More than three times a week    Frequency of Social Gatherings with Friends and Family: Once a week    Attends Religious Services: More than 4 times per year    Active Member of [employer]  or Organizations: No    Attends Archivist Meetings: Never    Marital Status: Married  Activities of Daily Living    08/23/2022    3:34 PM 08/20/2022   10:00 PM  In your present state of health, do you have any difficulty performing the following activities:  Hearing? 0 0  Vision? 0 0  Difficulty concentrating or making decisions? 0 0  Walking or climbing stairs? 0 0  Dressing or bathing? 0 0  Doing errands, shopping? 0 0  Preparing Food and eating ? N   Using the Toilet? N   In the past six months, have you accidently leaked urine? N   Do you have problems with loss of bowel control? N   Managing your Medications? N   Managing your Finances? N   Housekeeping or managing your Housekeeping? Y   Comment wife helps     Patient Education/ Literacy How often do you need to have someone help you when you read instructions, pamphlets, or other written materials from your doctor or pharmacy?: 1 - Never What is the last grade level you completed in school?: 2 years of college  Exercise Current Exercise Habits: The patient does not participate in regular exercise at present, Exercise limited by: orthopedic condition(s)  Diet Patient reports consuming 2 meals a day and 0 snack(s) a day Patient reports that his primary diet is: Regular Patient reports that she does have regular access to food.   Depression Screen    08/23/2022    3:30 PM 04/05/2022    2:51 PM 08/17/2021    3:00 PM 02/20/2021   11:35 AM 06/21/2019   11:09 AM 11/12/2018    1:53 PM 01/15/2018    1:14 PM  PHQ 2/9 Scores  PHQ - 2 Score 0 2 0 2 0 0 4  PHQ- 9 Score    6   10     Fall Risk    08/23/2022    3:30 PM 04/05/2022    2:52 PM 08/17/2021    3:00 PM 08/15/2021   12:37 PM 02/20/2021   11:16 AM  Fall Risk   Falls in the past year? '1 1 1 1 1  '$ Number falls in past yr: 1 1 0 0 0  Injury with Fall? 0 0 0 0 0  Risk for fall due to : History of fall(s) History of fall(s) History of fall(s)    Follow up Falls  prevention discussed;Education provided;Falls evaluation completed Falls evaluation completed Falls evaluation completed;Education provided  Falls evaluation completed     Objective:  Mark [name]. seemed alert and oriented and he participated appropriately during our telephone visit.  Blood Pressure Weight BMI  BP Readings from Last 3 Encounters:  08/21/22 130/84  07/24/22 (!) 142/83  06/19/22 122/72   Wt Readings from Last 3 Encounters:  08/20/22 295 lb 9.6 oz (134.1 kg)  07/24/22 (!) 309 lb (140.2 kg)  06/19/22 299 lb 6.4 oz (135.8 kg)   BMI Readings from Last 1 Encounters:  08/20/22 39.00 kg/m    *Unable to obtain current vital signs, weight, and BMI due to telephone visit type  Hearing/Vision  Nishaan did not seem to have difficulty with hearing/understanding during the telephone conversation Reports that he has had a formal eye exam by an eye care professional within the past year Reports that he has had a formal hearing evaluation within the past year *Unable to fully assess hearing and vision during telephone visit type  Cognitive Function:    08/23/2022    3:35 PM 08/17/2021    3:09 PM 06/21/2019  11:12 AM  6CIT Screen  What Year? 0 points 0 points 0 points  What month? 0 points 0 points 0 points  What time? 0 points 0 points 0 points  Count back from 20 0 points 0 points 0 points  Months in reverse 0 points 0 points 0 points  Repeat phrase 0 points 0 points 0 points  Total Score 0 points 0 points 0 points   (Normal:0-7, Significant for Dysfunction: >8)  Normal Cognitive Function Screening: Yes   Immunization & Health Maintenance Record Immunization History  Administered Date(s) Administered   Tdap 01/04/2015    Health Maintenance  Topic Date Due   COVID-19 Vaccine (1) 09/08/2022 (Originally 01/18/1966)   INFLUENZA VACCINE  10/20/2022 (Originally 02/19/2022)   Zoster Vaccines- Shingrix (1 of 2) 11/21/2022 (Originally 01/17/1980)   Medicare Annual  Wellness (AWV)  08/24/2023   DTaP/Tdap/Td (2 - Td or Tdap) 01/03/2025   COLONOSCOPY (Pts 45-45yr Insurance coverage will need to be confirmed)  04/30/2027   Hepatitis C Screening  Completed   HIV Screening  Completed   Pneumococcal Vaccine 136675Years old  Aged Out   HPV VACCINES  Aged Out       Assessment  This is a routine wellness examination for [employer]Marland Kitchen Health Maintenance: Due or Overdue There are no preventive care reminders to display for this patient.   Jakiah A [employer] does not need a referral for C[employer] CompanyAssistance: Care Management:   no Social Work:    no Prescription Assistance:  no Nutrition/Diabetes Education:  no   Plan:  Personalized Goals  Goals Addressed               This Visit's Progress     Patient Stated (pt-stated)        Patient stated that he would ike to spend more time with the grandkids       Personalized Health Maintenance & Screening Recommendations  Influenza vaccine Shingles vaccine  Lung Cancer Screening Recommended: no (Low Dose CT Chest recommended if Age 61-80years, 30 pack-year currently smoking OR have quit w/in past 15 years) Hepatitis C Screening recommended: no HIV Screening recommended: no  Advanced Directives: Written information was not prepared per patient's request.  Referrals & Orders No orders of the defined types were placed in this encounter.   Follow-up Plan Follow-up with MHali Marry MD as planned Schedule influenza vaccine and shingles vaccine. Medicare wellness in one year.  Patient will access AVS on my chart.   I have personally reviewed and noted the following in the patient's chart:   Medical and social history Use of alcohol, tobacco or illicit drugs  Current medications and supplements Functional ability and status Nutritional status Physical activity Advanced directives List of other physicians Hospitalizations, surgeries, and ER visits in previous  12 months Vitals Screenings to include cognitive, depression, and falls Referrals and appointments  In addition, I have reviewed and discussed with EGwenith [name] certain preventive protocols, quality metrics, and best practice recommendations. A written personalized care plan for preventive services as well as general preventive health recommendations is available and can be mailed to the patient at his request.      BTinnie Gens RN BSN  08/23/2022

## 2022-08-23 NOTE — Patient Instructions (Addendum)
[place] Maintenance Summary and Written Plan of Care  [name] ,  Thank you for allowing me to perform your Medicare Annual Wellness Visit and for your ongoing commitment to your health.   Health Maintenance & Immunization History Health Maintenance  Topic Date Due   COVID-19 Vaccine (1) 09/08/2022 (Originally 01/18/1966)   INFLUENZA VACCINE  10/20/2022 (Originally 02/19/2022)   Zoster Vaccines- Shingrix (1 of 2) 11/21/2022 (Originally 01/17/1980)   Medicare Annual Wellness (AWV)  08/24/2023   DTaP/Tdap/Td (2 - Td or Tdap) 01/03/2025   COLONOSCOPY (Pts 45-24yr Insurance coverage will need to be confirmed)  04/30/2027   Hepatitis C Screening  Completed   HIV Screening  Completed   Pneumococcal Vaccine 62 Years old  Aged Out   HPV VACCINES  Aged Out   Immunization History  Administered Date(s) Administered   Tdap 01/04/2015    These are the patient goals that we discussed:  Goals Addressed               This Visit's Progress     Patient Stated (pt-stated)        Patient stated that he would ike to spend more time with the grandkids         This is a list of Health Maintenance Items that are overdue or due now: Influenza vaccine Shingles vaccine    Orders/Referrals Placed Today: No orders of the defined types were placed in this encounter.  (Contact our referral department at [phone] you have not spoken with someone about your referral appointment within the next 5 days)    Follow-up Plan Follow-up with MHali Marry MD as planned Schedule influenza vaccine and shingles vaccine. Medicare wellness in one year.  Patient will access AVS on my chart.      Health Maintenance, Male Adopting a healthy lifestyle and getting preventive care are important in promoting health and wellness. Ask your health care provider about: The right schedule for you to have regular tests and exams. Things you can do on your  own to prevent diseases and keep yourself healthy. What should I know about diet, weight, and exercise? Eat a healthy diet  Eat a diet that includes plenty of vegetables, fruits, low-fat dairy products, and lean protein. Do not eat a lot of foods that are high in solid fats, added sugars, or sodium. Maintain a healthy weight Body mass index (BMI) is a measurement that can be used to identify possible weight problems. It estimates body fat based on height and weight. Your health care provider can help determine your BMI and help you achieve or maintain a healthy weight. Get regular exercise Get regular exercise. This is one of the most important things you can do for your health. Most adults should: Exercise for at least 150 minutes each week. The exercise should increase your heart rate and make you sweat (moderate-intensity exercise). Do strengthening exercises at least twice a week. This is in addition to the moderate-intensity exercise. Spend less time sitting. Even light physical activity can be beneficial. Watch cholesterol and blood lipids Have your blood tested for lipids and cholesterol at 61years of age, then have this test every 5 years. You may need to have your cholesterol levels checked more often if: Your lipid or cholesterol levels are high. You are older than 61years of age. You are at high risk for heart disease. What should I know about cancer screening? Many types of cancers can be detected early  and may often be prevented. Depending on your health history and family history, you may need to have cancer screening at various ages. This may include screening for: Colorectal cancer. Prostate cancer. Skin cancer. Lung cancer. What should I know about heart disease, diabetes, and high blood pressure? Blood pressure and heart disease High blood pressure causes heart disease and increases the risk of stroke. This is more likely to develop in people who have high blood  pressure readings or are overweight. Talk with your health care provider about your target blood pressure readings. Have your blood pressure checked: Every 3-5 years if you are 61-47 years of age. Every year if you are 61 years old or older. If you are between the ages of 41 and 22 and are a current or former smoker, ask your health care provider if you should have a one-time screening for abdominal aortic aneurysm (AAA). Diabetes Have regular diabetes screenings. This checks your fasting blood sugar level. Have the screening done: Once every three years after age 61 if you are at a normal weight and have a low risk for diabetes. More often and at a younger age if you are overweight or have a high risk for diabetes. What should I know about preventing infection? Hepatitis B If you have a higher risk for hepatitis B, you should be screened for this virus. Talk with your health care provider to find out if you are at risk for hepatitis B infection. Hepatitis C Blood testing is recommended for: Everyone born from [birth year] through 1964. Anyone with known risk factors for hepatitis C. Sexually transmitted infections (STIs) You should be screened each year for STIs, including gonorrhea and chlamydia, if: You are sexually active and are younger than 61 years of age. You are older than 61 years of age and your health care provider tells you that you are at risk for this type of infection. Your sexual activity has changed since you were last screened, and you are at increased risk for chlamydia or gonorrhea. Ask your health care provider if you are at risk. Ask your health care provider about whether you are at high risk for HIV. Your health care provider may recommend a prescription medicine to help prevent HIV infection. If you choose to take medicine to prevent HIV, you should first get tested for HIV. You should then be tested every 3 months for as long as you are taking the medicine. Follow these  instructions at home: Alcohol use Do not drink alcohol if your health care provider tells you not to drink. If you drink alcohol: Limit how much you have to 0-2 drinks a day. Know how much alcohol is in your drink. In the [place] one drink equals one 12 oz bottle of beer (355 mL), one 5 oz glass of wine (148 mL), or one 1 oz glass of hard liquor (44 mL). Lifestyle Do not use any products that contain nicotine or tobacco. These products include cigarettes, chewing tobacco, and vaping devices, such as e-cigarettes. If you need help quitting, ask your health care provider. Do not use street drugs. Do not share needles. Ask your health care provider for help if you need support or information about quitting drugs. General instructions Schedule regular health, dental, and eye exams. Stay current with your vaccines. Tell your health care provider if: You often feel depressed. You have ever been abused or do not feel safe at home. Summary Adopting a healthy lifestyle and getting preventive care are important in  promoting health and wellness. Follow your health care provider's instructions about healthy diet, exercising, and getting tested or screened for diseases. Follow your health care provider's instructions on monitoring your cholesterol and blood pressure. This information is not intended to replace advice given to you by your health care provider. Make sure you discuss any questions you have with your health care provider. Document Revised: 11/27/2020 Document Reviewed: 11/27/2020 Elsevier Patient Education  [place].

## 2022-08-27 DIAGNOSIS — G992 Myelopathy in diseases classified elsewhere: Secondary | ICD-10-CM | POA: Diagnosis not present

## 2022-08-27 DIAGNOSIS — G35 Multiple sclerosis: Secondary | ICD-10-CM | POA: Diagnosis not present

## 2022-08-29 DIAGNOSIS — Z01818 Encounter for other preprocedural examination: Secondary | ICD-10-CM | POA: Diagnosis not present

## 2022-08-29 DIAGNOSIS — M1712 Unilateral primary osteoarthritis, left knee: Secondary | ICD-10-CM | POA: Diagnosis not present

## 2022-08-29 DIAGNOSIS — M76892 Other specified enthesopathies of left lower limb, excluding foot: Secondary | ICD-10-CM | POA: Diagnosis not present

## 2022-08-30 DIAGNOSIS — G4733 Obstructive sleep apnea (adult) (pediatric): Secondary | ICD-10-CM | POA: Diagnosis not present

## 2022-09-05 ENCOUNTER — Ambulatory Visit (INDEPENDENT_AMBULATORY_CARE_PROVIDER_SITE_OTHER): Payer: Medicare Other | Admitting: Family Medicine

## 2022-09-05 ENCOUNTER — Encounter: Payer: Self-pay | Admitting: Family Medicine

## 2022-09-05 VITALS — BP 100/62 | HR 86 | Ht 73.0 in | Wt 304.1 lb

## 2022-09-05 DIAGNOSIS — R7301 Impaired fasting glucose: Secondary | ICD-10-CM | POA: Diagnosis not present

## 2022-09-05 DIAGNOSIS — K760 Fatty (change of) liver, not elsewhere classified: Secondary | ICD-10-CM | POA: Diagnosis not present

## 2022-09-05 DIAGNOSIS — R609 Edema, unspecified: Secondary | ICD-10-CM

## 2022-09-05 DIAGNOSIS — R052 Subacute cough: Secondary | ICD-10-CM

## 2022-09-05 DIAGNOSIS — I7121 Aneurysm of the ascending aorta, without rupture: Secondary | ICD-10-CM

## 2022-09-05 DIAGNOSIS — J101 Influenza due to other identified influenza virus with other respiratory manifestations: Secondary | ICD-10-CM

## 2022-09-05 DIAGNOSIS — R7309 Other abnormal glucose: Secondary | ICD-10-CM | POA: Diagnosis not present

## 2022-09-05 LAB — POCT GLYCOSYLATED HEMOGLOBIN (HGB A1C): Hemoglobin A1C: 5.7 % — AB (ref 4.0–5.6)

## 2022-09-05 MED ORDER — PREDNISONE 20 MG PO TABS: 40.0000 mg | ORAL_TABLET | Freq: Every day | ORAL | 0 refills | Status: DC

## 2022-09-05 NOTE — Progress Notes (Signed)
Acute Office Visit  Subjective:     Patient ID: Mark Spitz., male    DOB: 1961/05/27, 62 y.o.   MRN: [id number]  Chief Complaint  Patient presents with   Hospitalization Follow-up    Still not feeling well    HPI Here today for hospital follow-up.  Patient is in today for flu A.  Still coughing.  Hard to lay flat.  Getting some occasional mucus up but not a lot.  He does feel really 75% better than when he was in the hospital 2 weeks ago.  Some itching on his stomach and using hydrocortizone cream.    Did comment on the aortic aneurysm and it looks like it is pretty stable based on the CT scan that he had done.  Diameter still measuring 4.3 cm.  They also noted diffuse fatty liver on the CT.  As well as some volume loss at the right lung base most consistent with atelectasis or pneumonia.  Still has some chronic lower extremity swelling but says it stable it is not worse than usual.  ROS      Objective:    BP 100/62 (BP Location: Left Arm, Patient Position: Sitting, Cuff Size: Large)   Pulse 86   Ht 6' 1"$  (1.854 m)   Wt (!) 304 lb 1.3 oz (137.9 kg)   SpO2 92%   BMI 40.12 kg/m    Physical Exam Constitutional:      Appearance: He is well-developed.  HENT:     Head: Normocephalic and atraumatic.  Cardiovascular:     Rate and Rhythm: Normal rate and regular rhythm.     Heart sounds: Normal heart sounds.  Pulmonary:     Effort: Pulmonary effort is normal.     Breath sounds: Normal breath sounds.     Comments: Sounds are slightly coarse but no rhonchi or crackles. Musculoskeletal:     Comments: 1+ pitting edema in both ankles.  Skin:    General: Skin is warm and dry.  Neurological:     Mental Status: He is alert and oriented to person, place, and time.  Psychiatric:        Behavior: Behavior normal.     Results for orders placed or performed in visit on 09/05/22  POCT glycosylated hemoglobin (Hb A1C)  Result Value Ref Range   Hemoglobin A1C 5.7  (A) 4.0 - 5.6 %   HbA1c POC (<> result, manual entry)     HbA1c, POC (prediabetic range)     HbA1c, POC (controlled diabetic range)          Assessment & Plan:   Problem List Items Addressed This Visit       Cardiovascular and Mediastinum   Ascending aortic aneurysm (HCC)    Recent CT measures the aneurysm at 4.3 cm though it is definitely been stable since at least 2021.        Digestive   Fatty liver - Primary    Discussed fatty liver and really encouraged him to work on increasing vegetable intake and eating more lean proteins and cutting back on carbs and sweets.  We did discuss that fatty liver can eventually lead to cirrhosis.  Also because of the fatty liver noted on the CT we did an A1c today to check for diabetes/prediabetes.  A1c was mildly elevated at 5.7 today.  Again continue to work on cutting back on starches and carbs.        Endocrine   IFG (impaired fasting glucose)  A1c was mildly elevated at 5.7 today.  Again continue to work on cutting back on starches and carbs.        Other   LEG EDEMA, BILATERAL    Stable with some edema.       Other Visit Diagnoses     Subacute cough       Abnormal glucose       Relevant Orders   POCT glycosylated hemoglobin (Hb A1C) (Completed)   Influenza A           Prolonged cough after hospitalization with flu.  We discussed maybe doing 5 days of prednisone to see if we can help the cough lungs are clear just some coarse breath sounds so I do think would help some of the bronchial inflammation that still is occurring.  Influenza-overall he is feeling better still some residual cough 2 weeks later.  Meds ordered this encounter  Medications   predniSONE (DELTASONE) 20 MG tablet    Sig: Take 2 tablets (40 mg total) by mouth daily with breakfast.    Dispense:  10 tablet    Refill:  0    No follow-ups on file.  Beatrice Lecher, MD

## 2022-09-05 NOTE — Assessment & Plan Note (Signed)
A1c was mildly elevated at 5.7 today.  Again continue to work on cutting back on starches and carbs.

## 2022-09-05 NOTE — Assessment & Plan Note (Signed)
Discussed fatty liver and really encouraged him to work on increasing vegetable intake and eating more lean proteins and cutting back on carbs and sweets.  We did discuss that fatty liver can eventually lead to cirrhosis.  Also because of the fatty liver noted on the CT we did an A1c today to check for diabetes/prediabetes.  A1c was mildly elevated at 5.7 today.  Again continue to work on cutting back on starches and carbs.

## 2022-09-05 NOTE — Assessment & Plan Note (Signed)
Recent CT measures the aneurysm at 4.3 cm though it is definitely been stable since at least 2021.

## 2022-09-05 NOTE — Assessment & Plan Note (Signed)
Stable with some edema.

## 2022-09-09 DIAGNOSIS — M546 Pain in thoracic spine: Secondary | ICD-10-CM | POA: Diagnosis not present

## 2022-09-09 DIAGNOSIS — G35 Multiple sclerosis: Secondary | ICD-10-CM | POA: Diagnosis not present

## 2022-09-09 DIAGNOSIS — M5440 Lumbago with sciatica, unspecified side: Secondary | ICD-10-CM | POA: Diagnosis not present

## 2022-09-09 DIAGNOSIS — M542 Cervicalgia: Secondary | ICD-10-CM | POA: Diagnosis not present

## 2022-09-19 ENCOUNTER — Ambulatory Visit (INDEPENDENT_AMBULATORY_CARE_PROVIDER_SITE_OTHER): Payer: Medicare Other | Admitting: Family Medicine

## 2022-09-19 ENCOUNTER — Encounter: Payer: Self-pay | Admitting: Family Medicine

## 2022-09-19 VITALS — BP 132/72 | HR 66 | Temp 98.4°F | Ht 73.0 in | Wt 302.0 lb

## 2022-09-19 DIAGNOSIS — R21 Rash and other nonspecific skin eruption: Secondary | ICD-10-CM

## 2022-09-19 DIAGNOSIS — L03116 Cellulitis of left lower limb: Secondary | ICD-10-CM | POA: Diagnosis not present

## 2022-09-19 DIAGNOSIS — R052 Subacute cough: Secondary | ICD-10-CM | POA: Diagnosis not present

## 2022-09-19 MED ORDER — DOXYCYCLINE HYCLATE 100 MG PO TABS: 100.0000 mg | ORAL_TABLET | Freq: Two times a day (BID) | ORAL | 0 refills | Status: DC

## 2022-09-19 NOTE — Progress Notes (Signed)
Established Patient Office Visit  Subjective   Patient ID: Mark Spitz., male    DOB: 1961/03/08  Age: 62 y.o. MRN: [id number]  Chief Complaint  Patient presents with   Rash    Rash bilateral legs    HPI  He is here today for a couple of concerns.  He is still coughing.  He was initially diagnosed with flu on January 30.  I saw him a couple of weeks ago and he was still having productive cough so we treated him with prednisone he says it did help but then started coughing again after the medication wore off.  He notices that night when he lays down he is getting some wheezing.  No fevers chills or sweats.  Cough is more dry but he feels like he is also getting some drainage.  He is also noticed some erythema on the left lower leg.  He says it has been itchy and irritated.  He still has a rash on his sides as well that gets a little itchy and irritated.  He did try using some moisturizer on it for couple days.  It did help temporarily.     ROS    Objective:     BP 132/72   Pulse 66   Temp 98.4 F (36.9 C)   Ht '6\' 1"'$  (1.854 m)   Wt (!) 302 lb (137 kg)   SpO2 96%   BMI 39.84 kg/m    Physical Exam Constitutional:      Appearance: He is well-developed.  HENT:     Head: Normocephalic and atraumatic.  Cardiovascular:     Rate and Rhythm: Normal rate and regular rhythm.     Heart sounds: Normal heart sounds.  Pulmonary:     Effort: Pulmonary effort is normal.     Breath sounds: Normal breath sounds.  Skin:    General: Skin is warm and dry.     Comments: Leg is erythematous with excoriations the skin is swollen and taut.  1+ pitting edema around the ankle and lower leg.  No active wounds or drainage.  Skin on the lateral abdomen area looks like a mild folliculitis.  Neurological:     Mental Status: He is alert and oriented to person, place, and time.  Psychiatric:        Behavior: Behavior normal.      No results found for any visits on  09/19/22.    The ASCVD Risk score (Arnett DK, et al., 2019) failed to calculate for the following reasons:   The valid total cholesterol range is 130 to 320 mg/dL    Assessment & Plan:   Problem List Items Addressed This Visit   None Visit Diagnoses     Cellulitis of leg, left    -  Primary   Relevant Medications   doxycycline (VIBRA-TABS) 100 MG tablet   Subacute cough       Rash and nonspecific skin eruption           Recommend wear compression stocking or an Ace wrap on that left leg to help push the fluid.  You may want to take her Mark [name] for couple days as well. Make sure applying a moisturizer daily to the skin on the abdomen and lower legs.  Will also treat with doxycycline since he has some excoriations and some erythema indicative of possible early cellulitis.  Rash - If the rash on the stomach area is not improving let me know and I will  refer you to dermatology.  Cough, postinfectious-is not improving over the next week then let me know and we will plan to get a chest x-ray.  No follow-ups on file.    Mark Lecher, MD

## 2022-09-19 NOTE — Patient Instructions (Signed)
Recommend wear compression stocking or an Ace wrap on that left leg to help push the fluid.  You may want to take her Lasix for couple days as well. Make sure applying a moisturizer daily to the skin on the abdomen and lower legs. If the rash on the stomach area is not improving let me know and I will refer you to dermatology. Cough is not improving over the next week then let me know and we will plan to get a chest x-ray.

## 2022-09-26 ENCOUNTER — Telehealth: Payer: Self-pay | Admitting: Family Medicine

## 2022-09-26 DIAGNOSIS — R052 Subacute cough: Secondary | ICD-10-CM

## 2022-09-26 NOTE — Telephone Encounter (Signed)
Pt called requesting a refill of doxycycline (VIBRA-TABS) 100 MG tablet [id number] . Pt stated it was working but he is still sick. PT has an appointment on 09/30/2022.

## 2022-09-27 ENCOUNTER — Ambulatory Visit (INDEPENDENT_AMBULATORY_CARE_PROVIDER_SITE_OTHER): Payer: Medicare Other

## 2022-09-27 DIAGNOSIS — J9811 Atelectasis: Secondary | ICD-10-CM | POA: Diagnosis not present

## 2022-09-27 DIAGNOSIS — R052 Subacute cough: Secondary | ICD-10-CM | POA: Diagnosis not present

## 2022-09-27 DIAGNOSIS — R059 Cough, unspecified: Secondary | ICD-10-CM | POA: Diagnosis not present

## 2022-09-27 NOTE — Telephone Encounter (Signed)
CXR ordered. He can go today if he would like

## 2022-09-27 NOTE — Telephone Encounter (Signed)
Patient advised about x-ray order.

## 2022-09-28 ENCOUNTER — Other Ambulatory Visit: Payer: Self-pay | Admitting: Nurse Practitioner

## 2022-09-28 DIAGNOSIS — K219 Gastro-esophageal reflux disease without esophagitis: Secondary | ICD-10-CM

## 2022-09-28 DIAGNOSIS — G4733 Obstructive sleep apnea (adult) (pediatric): Secondary | ICD-10-CM | POA: Diagnosis not present

## 2022-09-30 ENCOUNTER — Ambulatory Visit (INDEPENDENT_AMBULATORY_CARE_PROVIDER_SITE_OTHER): Payer: Medicare Other | Admitting: Family Medicine

## 2022-09-30 ENCOUNTER — Encounter: Payer: Self-pay | Admitting: Family Medicine

## 2022-09-30 VITALS — BP 110/67 | HR 77 | Ht 73.0 in | Wt 306.0 lb

## 2022-09-30 DIAGNOSIS — R052 Subacute cough: Secondary | ICD-10-CM

## 2022-09-30 DIAGNOSIS — I878 Other specified disorders of veins: Secondary | ICD-10-CM

## 2022-09-30 DIAGNOSIS — R600 Localized edema: Secondary | ICD-10-CM

## 2022-09-30 DIAGNOSIS — R7301 Impaired fasting glucose: Secondary | ICD-10-CM

## 2022-09-30 DIAGNOSIS — I10 Essential (primary) hypertension: Secondary | ICD-10-CM

## 2022-09-30 NOTE — Patient Instructions (Signed)
Compression stockings recommend 25 to 30 cm of water pressure.  You can either get the knee-high's or the ones that come above the knees.  Just want to try to get above the area of swelling.  There is a local DME supplier here in town called [employer] which does have a small set of compression stockings available.

## 2022-09-30 NOTE — Progress Notes (Signed)
NO sign of acute infection.

## 2022-09-30 NOTE — Assessment & Plan Note (Signed)
A1c looks great should not preclude him from surgery.

## 2022-09-30 NOTE — Assessment & Plan Note (Signed)
BP looks great today!!  

## 2022-09-30 NOTE — Progress Notes (Signed)
Established Patient Office Visit  Subjective   Patient ID: Gwenith Spitz., male    DOB: 04-07-1961  Age: 62 y.o. MRN: [id number]  Chief Complaint  Patient presents with   Hypertension   Cough    Hypertension- Pt denies chest pain, SOB, dizziness, or heart palpitations.  Taking meds as directed w/o problems.  Denies medication side effects.    He still scheduled for surgery on March 14 send later this week.  I just seen him at the end of February for cellulitis of the left leg and a cough.  Impaired fasting glucose-last A1c looked great at 5.7 so he should be good for surgery in that regard.  Cough overall he does feel like it is better he went for chest x-ray this weekend and it was negative except for some bibasilar atelectasis.  He does notice still some intermittent wheezing.  He did use his inspirometer was able to get it all the way to the top yesterday morning but then by the evening after riding in the car all day he was unable to get it all the way up.  HPI    ROS    Objective:     BP 110/67   Pulse 77   SpO2 94%    Physical Exam Constitutional:      Appearance: He is well-developed.  HENT:     Head: Normocephalic and atraumatic.  Cardiovascular:     Rate and Rhythm: Normal rate and regular rhythm.     Heart sounds: Normal heart sounds.  Pulmonary:     Effort: Pulmonary effort is normal.     Breath sounds: Normal breath sounds.  Skin:    General: Skin is warm and dry.  Neurological:     Mental Status: He is alert and oriented to person, place, and time.  Psychiatric:        Behavior: Behavior normal.      No results found for any visits on 09/30/22.    The ASCVD Risk score (Arnett DK, et al., 2019) failed to calculate for the following reasons:   The valid total cholesterol range is 130 to 320 mg/dL    Assessment & Plan:   Problem List Items Addressed This Visit       Cardiovascular and Mediastinum   Essential hypertension -  Primary    BP looks great today!!         Endocrine   IFG (impaired fasting glucose)    A1c looks great should not preclude him from surgery.      Other Visit Diagnoses     Subacute cough       Lower extremity edema       Venous stasis          Cough-overall much better but still residual cough.  Chest is clear on exam today.  Chest x-ray was normal and reassuring just showed some atelectasis at the bases.  We discussed doing some deep breathing using his an spirometer but this is not a sign of any residual infection and should not preclude his surgery.   Venous stasis - Also discussed to continue wearing his compression stockings.  No sign of active cellulitis on his legs there is still some erythema and some pitting edema but again no active sign of cellulitis at this point so we also discussed maybe upgrading for the compression to 25 to 30 cm of water pressure.  Return in about 6 months (around 04/02/2023) for Hypertension.  Deshaun Schou, MD  

## 2022-10-04 ENCOUNTER — Ambulatory Visit: Payer: Medicare Other | Admitting: Family Medicine

## 2022-10-08 DIAGNOSIS — G4733 Obstructive sleep apnea (adult) (pediatric): Secondary | ICD-10-CM | POA: Diagnosis not present

## 2022-10-10 ENCOUNTER — Telehealth: Payer: Self-pay | Admitting: Family Medicine

## 2022-10-10 DIAGNOSIS — R052 Subacute cough: Secondary | ICD-10-CM

## 2022-10-10 NOTE — Telephone Encounter (Signed)
Patient called would like something sent to pharmacy for his cough I stated he needed an appointment he said he'd been seen for this previously

## 2022-10-11 MED ORDER — PREDNISONE 20 MG PO TABS: 20.0000 mg | ORAL_TABLET | Freq: Every day | ORAL | 0 refills | Status: DC

## 2022-10-11 MED ORDER — BENZONATATE 200 MG PO CAPS: 200.0000 mg | ORAL_CAPSULE | Freq: Two times a day (BID) | ORAL | 0 refills | Status: DC | PRN

## 2022-10-11 NOTE — Telephone Encounter (Signed)
Patient wife ( on [place] ) informed and will inform patient.

## 2022-10-11 NOTE — Telephone Encounter (Signed)
Meds ordered this encounter  Medications   predniSONE (DELTASONE) 20 MG tablet    Sig: Take 1 tablet (20 mg total) by mouth daily with breakfast.    Dispense:  5 tablet    Refill:  0   benzonatate (TESSALON) 200 MG capsule    Sig: Take 1 capsule (200 mg total) by mouth 2 (two) times daily as needed for cough.    Dispense:  20 capsule    Refill:  0

## 2022-10-18 ENCOUNTER — Ambulatory Visit: Payer: Medicare Other | Admitting: Nurse Practitioner

## 2022-10-18 ENCOUNTER — Encounter: Payer: Self-pay | Admitting: Nurse Practitioner

## 2022-10-18 VITALS — BP 106/62 | HR 90 | Temp 98.3°F | Ht 73.0 in | Wt 295.2 lb

## 2022-10-18 DIAGNOSIS — J387 Other diseases of larynx: Secondary | ICD-10-CM | POA: Diagnosis not present

## 2022-10-18 DIAGNOSIS — G4733 Obstructive sleep apnea (adult) (pediatric): Secondary | ICD-10-CM

## 2022-10-18 DIAGNOSIS — Z01811 Encounter for preprocedural respiratory examination: Secondary | ICD-10-CM | POA: Diagnosis not present

## 2022-10-18 DIAGNOSIS — J984 Other disorders of lung: Secondary | ICD-10-CM

## 2022-10-18 HISTORY — DX: Encounter for preprocedural respiratory examination: Z01.811

## 2022-10-18 NOTE — Assessment & Plan Note (Signed)
Moderate risk. Factors that increase the risk for postoperative pulmonary complications are severe OSA, obesity, age.  Respiratory complications generally occur in 1% of ASA Class I patients, 5% of ASA Class II and 10% of ASA Class III-IV patients These complications rarely result in mortality and include postoperative pneumonia, atelectasis, pulmonary embolism, ARDS and increased time requiring postoperative mechanical ventilation.   Overall, I recommend proceeding with the surgery if the risk for respiratory complications are outweighed by the potential benefits. This will need to be discussed between the patient and surgeon.   To reduce risks of respiratory complications, I recommend: --Pre- and post-operative incentive spirometry performed frequently while awake --Inpatient use of currently prescribed positive-pressure for OSA whenever the patient is sleeping. Recommend extubation onto BiPAP/CPAP --Short duration of surgery as much as possible and avoid paralytic if possible --OOB, encourage mobility post-op, DVT prophylaxis if indicated   1) RISK FOR PROLONGED MECHANICAL VENTILAION - > 48h  1A) Arozullah - Prolonged mech ventilation risk Arozullah Postperative Pulmonary Risk Score - for mech ventilation dependence >48h [employer], Ann Surg 2000, major non-cardiac surgery) Comment Score  Type of surgery - abd ao aneurysm (27), thoracic (21), neurosurgery / upper abdominal / vascular (21), neck (11) L TKA 5  Emergency Surgery - (11)  0  ALbumin < 3 or poor nutritional state - (9) obesity 5  BUN > 30 -  (8)  0  Partial or completely dependent functional status - (7)  0  COPD -  (6) Severe OSA 6  Age - 60 to 18 (4), > 70  (6) 60 4  TOTAL  20  Risk Stratifcation scores  - < 10 (0.5%), 11-19 (1.8%), 20-27 (4.2%), 28-40 (10.1%), >40 (26.6%)  4.2%

## 2022-10-18 NOTE — Assessment & Plan Note (Signed)
He has been evaluated by ENT in the past for this. Did not have any significant findings. We started flonase and pepcid at previous visit, which seemed to have helped some. He has had slight increased in hoarseness and postnasal drainage with feeling like his voice catches over the past few weeks. Possibly due to allergies. No increased dyspnea and cough had improved post COVID. Target postnasal drainage and limit upper airway irritation. Advised him to follow up with ENT if symptoms do not improve/worsen. We could also consider PFTs to assess for VCD should he fail to improve with conservative measures.

## 2022-10-18 NOTE — Patient Instructions (Addendum)
Continue CPAP every night, minimum of 4-6 hours a night.  Adjust pressure settings to 6-10 cmH2O Change equipment every 30 days or as directed by DME. Wash your tubing with warm soap and water daily, hang to dry. Wash humidifier portion weekly.  Be aware of reduced alertness and do not drive or operate heavy machinery if experiencing this or drowsiness.  Notify if persistent daytime sleepiness occurs even with consistent use of CPAP.   Continue Famotidine 20 mg Twice daily for reflux Continue Flonase nasal spray 2 sprays each nostril daily for postnasal drainage Daily allergy pill such as claritin, zyrtec, xyzal or allegra  Try astelin nasal spray 2 sprays each nostril Twice daily for nasal congestion/postnasal drainage Avoid throat clearing. Use sugar free hard candies or non-menthol cough drops during this time to soothe your throat.  Warm tea with honey and lemon.   If your voice hoarseness doesn't improve with the above measures, either talk to Ear Nose and Throat about further evaluation of your larynx or let us know and we can order pulmonary function testing to look for possible vocal cord dysfunction  Good luck with your surgery. Out of bed as soon as possible afterwards. Use incentive spirometer 10 times an hour while awake during the recovery period. Wear your CPAP during naps and at night. Take your CPAP mask and tubing with you to your surgery.    Follow up in 4 months with Dr. Elsworth Soho or Alanson Aly. If symptoms do not improve or worsen, please contact office for sooner follow up

## 2022-10-18 NOTE — Progress Notes (Signed)
@Patient  ID: Mark [name]., male    DOB: 11/25/1960, 62 y.o.   MRN: [id number]  Chief Complaint  Patient presents with   Follow-up    Sob, cough, pt still has this cough after having the flu in January.     Referring provider: Hali Marry, *  HPI: 62 year old male, never smoker followed for severe OSA. He is a patient of Dr. Bari Mantis and last seen in office on 06/19/2022 by [facility] NP. Past medical history significant for HTN, AAA, MS, osteoarthritis, BPH, depression, obesity.  TEST/EVENTS:  06/2012 NPSG: AHI 98/h, corrected by CPAP 11 cmH2O. 358 lb 12/2018 PFT: ratio 81, FVC 52, TLC 44, DLCO 84 03/2021 CT chest: clear lungs 10/2021 NPSG: AHI 34/h, SpO2 low 62%; 2 lpm O2 added 02/04/2022 CPAP titration: optimal PAP pressure 15 cmH2O with residual AHI 2.3/h 04/05/2022 HST: sleeping upright AHI 29/h  12/19/2021: OV with Dr. Elsworth Soho. Previously placed on Breo due to chronic cough and recurrent pneumonia. PFTs without airway obstruction; did have some restriction suspected to be r/t weight. Stopped Breo. He had recent sleep study with improvement in OSA but still considered severe 32/hr. Still struggling with CPAP compliance. Discussed various treatment options. Dental appliance suboptimal d/t severity. BMI too high for hypoglossal nerve but plan for referral to ENT while he works on weight loss measures. Offered more CPAP desensitization; he will continue to try at home. He does not notice snoring when he sleeps upright; plan for HST with him sleeping in upright position.   04/15/2022: OV with Teng Decou NP for follow up to discuss recent sleep study results.  He had a sleep study in April 2023 which showed severe sleep apnea with AHI 34.  He underwent CPAP titration February 04, 2022 due to issues with tolerating CPAP.  Was found to be well controlled on 15 cmH2O with residual AHI 2.3.  He then underwent home sleep study, not on CPAP and while sleeping in his recliner, which still showed severe  sleep apnea with AHI 29.  He has a longstanding history of OSA.  He has a CPAP machine at home but has had trouble tolerating it.  Reports that even when he has used in the past he has not noticed a huge difference in the morning.  The longest stretch of time that he used it was nightly for a week.  He does continue to snore at night and his wife has told him that he stops breathing. He saw ENT in August for evaluation of inspire device.  He was recommended to work on weight loss measures and follow-up in 6 months.  He has lost around 60 pounds.  Currently having trouble with activity due to left knee pain.  He is supposed to be undergoing knee replacement surgery in the next month.  His CPAP machine is quite old.  He was going to take it to be recalibrated to see if he could start using it again.  He denies any extreme daytime fatigue symptoms or drowsy driving. He does worry that his snoring has caused him to have throat irritation. He's struggled with a hoarse voice for a while. He denies any nasal congestion or postnasal drip. He doesn't have any reflux symptoms that he notices. No cough or increased SOB.   06/19/2022: OV with Aalayah Riles NP for follow-up.  After last visit, he received a new CPAP machine.  He has been trying to be more consistent with use.  Download showed that he is 63%  compliance but is only using it for about 3-1/2 hours on average.  He does feel like his pressures might be set a little too high.  He wears a nasal mask and feels like the air is pushed out of his mouth. He does feel like it helps him sleep a little bit better. He denies any excessive daytime fatigue, sleep parasomnias/paralysis or drowsy driving. He does still have some hoarse voice and feels like there's phlegm in the back of his throat. No significant GERD symptoms. No cough or SOB. He is still working on weight loss measures. He's limited with his activity due to his chronic left knee pain. He did have surgery to remove the  cyst. Plan is to perform a knee replacement in the next month if cyst does not recur.   10/18/2022: Today - follow up Patient presents today for follow up and for surgical clearance. He is supposed to be having a left total knee replacement with Dr. Leonides Schanz. The date has not been scheduled yet. He had been hospitalized in January 2024 for acute respiratory failure secondary to influenza A. CTA without a s discharged home on room air. He had a persistent cough, which has mostly resolved. Because of this, he had a chest x ray with his PCP on 3/8 which showed mild atelectasis in the lung bases but otherwise, no acute process. He was encouraged to use his incentive spirometer and work on deep breathing exercises.  He tells me today that he is feeling well. He's had a slight increase in postnasal drainage and some voice hoarseness over the past few weeks, which he thinks may be related to allergies. He does feel like his voice catches at times because of this. He started taking claritin and is using flonase. He's still on pepcid for reflux, which is working well. He denies any shortness of breath or increased cough, wheezing, fevers, chills.  He is wearing his CPAP on more of a consistent basis. He feels like he is doing better with it. He's trying to get 4 hours or more a night. If he doesn't wear it or he gets less than 4 hours, it's usually because his sleep is interrupted by pain in his knee. He does not snore with CPAP use. Tends to sleep better with it. Feels like the pressures are better since we changed him to 6-10cmH2O. Denies any excessive daytime fatigue or drowsy driving.   [id number]: CPAP 6-10 cmH2O 28/30 days; 63% >4 hr; average use 4 hours 28 min Pressure median 8.6 Leaks 95th 17.8 AHI 2.9  No Known Allergies  Immunization History  Administered Date(s) Administered   Tdap 01/04/2015    Past Medical History:  Diagnosis Date   Anxiety    chronic pain treated with narcotics    Arthritis    Chronic pain    Constipation due to opioid therapy    COPD (chronic obstructive pulmonary disease) (HCC)    Depression    Dysphonia    thoracic left sided"impingement" with heminumbness   Dyspnea    GI bleed    abt 15 years ago   Hypertension    Morbid obesity ([place])    MS (multiple sclerosis) ([place]) 02/11/2013   [name]   Neuromuscular disorder [facility]) 2014   Pneumonia    Preoperative respiratory examination 10/18/2022   Sleep apnea    Sleep apnea with use of continuous positive airway pressure (CPAP) 02/11/2013   07-13-12 AHi of 98.6 /hr titrated to 11 cm  water ,  average user time 4 hours and 4 minutes. Residual AHI 1.20 December 2012 .   Umbilical hernia     Tobacco History: Social History   Tobacco Use  Smoking Status Never  Smokeless Tobacco Never   Counseling given: Not Answered   Outpatient Medications Prior to Visit  Medication Sig Dispense Refill   amphetamine-dextroamphetamine (ADDERALL XR) 30 MG 24 hr capsule Take 30 mg by mouth every morning.     benzonatate (TESSALON) 200 MG capsule Take 1 capsule (200 mg total) by mouth 2 (two) times daily as needed for cough. 20 capsule 0   Cholecalciferol (VITAMIN D3) 125 MCG (5000 UT) TABS Take 1 tablet by mouth daily.     Docusate Sodium 100 MG capsule Take by mouth.     DULoxetine (CYMBALTA) 60 MG capsule Take 60 mg by mouth 2 (two) times daily.      famotidine (PEPCID) 20 MG tablet TAKE ONE TABLET BY MOUTH TWICE DAILY 60 tablet 3   fluticasone (FLONASE) 50 MCG/ACT nasal spray Place 1 spray into both nostrils daily. 18.2 mL 2   furosemide (LASIX) 20 MG tablet Take 1 tablet (20 mg total) by mouth daily as needed. 20 tablet 1   gabapentin (NEURONTIN) 800 MG tablet Take 800 mg by mouth 4 (four) times daily.  11   HYDROcodone-acetaminophen (NORCO) 10-325 MG tablet Take 2 tablets by mouth every 4 (four) hours as needed for severe pain. 40 tablet 0   ibuprofen (ADVIL,MOTRIN) 800 MG tablet Take 800 mg by  mouth every 8 (eight) hours as needed for moderate pain.     loratadine (CLARITIN) 10 MG tablet Take by mouth.     losartan-hydrochlorothiazide (HYZAAR) 50-12.5 MG tablet Take 1 tablet by mouth daily. 90 tablet 1   morphine (MS CONTIN) 60 MG 12 hr tablet Take 60 mg by mouth every 12 (twelve) hours.     predniSONE (DELTASONE) 20 MG tablet Take 1 tablet (20 mg total) by mouth daily with breakfast. 5 tablet 0   tamsulosin (FLOMAX) 0.4 MG CAPS capsule Take 0.4 mg by mouth daily.     Teriflunomide (AUBAGIO) 14 MG TABS Take 1 tablet by mouth at bedtime.     tiZANidine (ZANAFLEX) 4 MG tablet Take 4 mg by mouth every 6 (six) hours as needed for muscle spasms.      No facility-administered medications prior to visit.     Review of Systems:   Constitutional: No weight loss or gain, night sweats, fevers, chills, or lassitude. +mild fatigue (improved) HEENT: No headaches, difficulty swallowing, tooth/dental problems, or sore throat. No sneezing, itching, ear ache, nasal congestion, or post nasal drip. +voice hoarseness; postnasal drainage CV:  No chest pain, orthopnea, PND, swelling in lower extremities, anasarca, dizziness, palpitations, syncope Resp: +snoring (resolves with CPAP); shortness of breath with strenuous exertion (baseline). No excess mucus or change in color of mucus. No productive or non-productive. No hemoptysis. No wheezing.  No chest wall deformity GI:  No heartburn, indigestion, abdominal pain, nausea, vomiting, diarrhea, change in bowel habits, loss of appetite, bloody stools.  MSK:  +left knee pain.  No decreased range of motion.  No back pain. Neuro: No dizziness or lightheadedness.  Psych: No depression or anxiety. Mood stable.     Physical Exam:  BP 106/62   Pulse 90   Temp 98.3 F (36.8 C) (Oral)   Ht 6\' 1"  (1.854 m)   Wt 295 lb 3.2 oz (133.9 kg)   SpO2 93%   BMI 38.95  kg/m   GEN: Pleasant, interactive, well-appearing; morbidly obese; in no acute distress. HEENT:   Normocephalic and atraumatic. PERRLA. Sclera white. Nasal turbinates pink, moist and patent bilaterally. No rhinorrhea present. Oropharynx erythematous and moist, without exudate or edema. No lesions, ulcerations NECK:  Supple w/ fair ROM. No JVD present. Normal carotid impulses w/o bruits. Thyroid symmetrical with no goiter or nodules palpated. No lymphadenopathy.   CV: RRR, no m/r/g, no peripheral edema. Pulses intact, +2 bilaterally. No cyanosis, pallor or clubbing. PULMONARY:  Unlabored, regular breathing. Clear bilaterally A&P w/o wheezes/rales/rhonchi. No accessory muscle use. No dullness to percussion. GI: BS present and normoactive. Soft, non-tender to palpation. No organomegaly or masses detected.  MSK: No erythema, warmth or tenderness. No deformities or joint swelling noted.  Neuro: A/Ox3. No focal deficits noted.   Skin: Warm, no lesions or rashe Psych: Normal affect and behavior. Judgement and thought content appropriate.     Lab Results:  CBC    Component Value Date/Time   WBC 3.7 (L) 08/21/2022 0455   RBC 4.44 08/21/2022 0455   HGB 12.3 (L) 08/21/2022 0455   HCT 38.2 (L) 08/21/2022 0455   PLT 167 08/21/2022 0455   MCV 86.0 08/21/2022 0455   MCH 27.7 08/21/2022 0455   MCHC 32.2 08/21/2022 0455   RDW 13.0 08/21/2022 0455   LYMPHSABS 0.2 (L) 08/20/2022 1058   MONOABS 0.3 08/20/2022 1058   EOSABS 0.1 08/20/2022 1058   BASOSABS 0.0 08/20/2022 1058    BMET    Component Value Date/Time   NA 134 (L) 08/21/2022 0455   NA 132 (A) 08/08/2022 0000   K 4.3 08/21/2022 0455   CL 96 (L) 08/21/2022 0455   CO2 31 08/21/2022 0455   GLUCOSE 116 (H) 08/21/2022 0455   BUN 20 08/21/2022 0455   BUN 18 08/08/2022 0000   CREATININE 0.76 08/21/2022 0455   CREATININE 0.82 06/26/2021 0000   CALCIUM 8.5 (L) 08/21/2022 0455   GFRNONAA >60 08/21/2022 0455   GFRNONAA 96 12/21/2019 1531   GFRAA 112 12/21/2019 1531    BNP    Component Value Date/Time   BNP 63.0 08/20/2022 1058    BNP 17 06/12/2021 0000     Imaging:  DG Chest 2 View  Result Date: 09/29/2022 CLINICAL DATA:  Cough. EXAM: CHEST - 2 VIEW COMPARISON:  Chest radiograph 08/20/2022 FINDINGS: ACDF. Thoracolumbar spinal fusion. Stable cardiac and mediastinal contours. Low lung volumes. Elevation right hemidiaphragm. Basilar atelectasis. No pleural effusion or pneumothorax. IMPRESSION: Low lung volumes with basilar atelectasis. Electronically Signed   By: Lovey Newcomer M.D.   On: 09/29/2022 21:03          No data to display          No results found for: "NITRICOXIDE"      Assessment & Plan:   OSA (obstructive sleep apnea) Severe OSA on CPAP. He has improved compliance with increase from 20% to 67% >4 hr of usage compared to last visit. Receiving benefit from use. He understands the risks of untreated OSA. Encouraged him to continue working on consistent use. Cautioned on safe driving practices.   Patient Instructions  Continue CPAP every night, minimum of 4-6 hours a night.  Adjust pressure settings to 6-10 cmH2O Change equipment every 30 days or as directed by DME. Wash your tubing with warm soap and water daily, hang to dry. Wash humidifier portion weekly.  Be aware of reduced alertness and do not drive or operate heavy machinery if experiencing this or drowsiness.  Notify if persistent daytime sleepiness occurs even with consistent use of CPAP.   Continue Famotidine 20 mg Twice daily for reflux Continue Flonase nasal spray 2 sprays each nostril daily for postnasal drainage Daily allergy pill such as claritin, zyrtec, xyzal or allegra  Try astelin nasal spray 2 sprays each nostril Twice daily for nasal congestion/postnasal drainage Avoid throat clearing. Use sugar free hard candies or non-menthol cough drops during this time to soothe your throat.  Warm tea with honey and lemon.   If your voice hoarseness doesn't improve with the above measures, either talk to Ear Nose and Throat about further  evaluation of your larynx or let us know and we can order pulmonary function testing to look for possible vocal cord dysfunction  Good luck with your surgery. Out of bed as soon as possible afterwards. Use incentive spirometer 10 times an hour while awake during the recovery period. Wear your CPAP during naps and at night. Take your CPAP mask and tubing with you to your surgery.    Follow up in 4 months with Dr. Elsworth Soho or Alanson Aly. If symptoms do not improve or worsen, please contact office for sooner follow up    Irritable larynx He has been evaluated by ENT in the past for this. Did not have any significant findings. We started flonase and pepcid at previous visit, which seemed to have helped some. He has had slight increased in hoarseness and postnasal drainage with feeling like his voice catches over the past few weeks. Possibly due to allergies. No increased dyspnea and cough had improved post COVID. Target postnasal drainage and limit upper airway irritation. Advised him to follow up with ENT if symptoms do not improve/worsen. We could also consider PFTs to assess for VCD should he fail to improve with conservative measures.   Preoperative respiratory examination Moderate risk. Factors that increase the risk for postoperative pulmonary complications are severe OSA, obesity, age.  Respiratory complications generally occur in 1% of ASA Class I patients, 5% of ASA Class II and 10% of ASA Class III-IV patients These complications rarely result in mortality and include postoperative pneumonia, atelectasis, pulmonary embolism, ARDS and increased time requiring postoperative mechanical ventilation.   Overall, I recommend proceeding with the surgery if the risk for respiratory complications are outweighed by the potential benefits. This will need to be discussed between the patient and surgeon.   To reduce risks of respiratory complications, I recommend: --Pre- and post-operative incentive  spirometry performed frequently while awake --Inpatient use of currently prescribed positive-pressure for OSA whenever the patient is sleeping. Recommend extubation onto BiPAP/CPAP --Short duration of surgery as much as possible and avoid paralytic if possible --OOB, encourage mobility post-op, DVT prophylaxis if indicated   1) RISK FOR PROLONGED MECHANICAL VENTILAION - > 48h  1A) Arozullah - Prolonged mech ventilation risk Arozullah Postperative Pulmonary Risk Score - for mech ventilation dependence >48h [employer], Ann [facility]urg 2000, major non-cardiac surgery) Comment Score  Type of surgery - abd ao aneurysm (27), thoracic (21), neurosurgery / upper abdominal / vascular (21), neck (11) L TKA 5  Emergency Surgery - (11)  0  ALbumin < 3 or poor nutritional state - (9) obesity 5  BUN > 30 -  (8)  0  Partial or completely dependent functional status - (7)  0  COPD -  (6) Severe OSA 6  Age - 60 to 49 (4), > 70  (6) 60 4  TOTAL  20  Risk Stratifcation scores  - <  10 (0.5%), 11-19 (1.8%), 20-27 (4.2%), 28-40 (10.1%), >40 (26.6%)  4.2%     Restrictive lung disease Felt to be secondary to obesity. DLCO nl. He has had no response to inhalers in the past. He had severe influenza illness and January, which he was hospitalized for. No CAP. He had some residual atelectasis at the lung base but otherwise, CXR clear. Clinically improved today. Encouraged him to continue working on deep breathing exercises, especially after his procedure.     I spent 35 minutes of dedicated to the care of this patient on the date of this encounter to include pre-visit review of records, face-to-face time with the patient discussing conditions above, post visit ordering of testing, clinical documentation with the electronic health record, making appropriate referrals as documented, and communicating necessary findings to members of the patients care team.  Clayton Bibles, NP 10/18/2022  Pt aware and understands  NP's role.

## 2022-10-18 NOTE — Assessment & Plan Note (Signed)
Felt to be secondary to obesity. DLCO nl. He has had no response to inhalers in the past. He had severe influenza illness and January, which he was hospitalized for. No CAP. He had some residual atelectasis at the lung base but otherwise, CXR clear. Clinically improved today. Encouraged him to continue working on deep breathing exercises, especially after his procedure.

## 2022-10-18 NOTE — Assessment & Plan Note (Signed)
Severe OSA on CPAP. He has improved compliance with increase from 20% to 67% >4 hr of usage compared to last visit. Receiving benefit from use. He understands the risks of untreated OSA. Encouraged him to continue working on consistent use. Cautioned on safe driving practices.   Patient Instructions  Continue CPAP every night, minimum of 4-6 hours a night.  Adjust pressure settings to 6-10 cmH2O Change equipment every 30 days or as directed by DME. Wash your tubing with warm soap and water daily, hang to dry. Wash humidifier portion weekly.  Be aware of reduced alertness and do not drive or operate heavy machinery if experiencing this or drowsiness.  Notify if persistent daytime sleepiness occurs even with consistent use of CPAP.   Continue Famotidine 20 mg Twice daily for reflux Continue Flonase nasal spray 2 sprays each nostril daily for postnasal drainage Daily allergy pill such as claritin, zyrtec, xyzal or allegra  Try astelin nasal spray 2 sprays each nostril Twice daily for nasal congestion/postnasal drainage Avoid throat clearing. Use sugar free hard candies or non-menthol cough drops during this time to soothe your throat.  Warm tea with honey and lemon.   If your voice hoarseness doesn't improve with the above measures, either talk to Ear Nose and Throat about further evaluation of your larynx or let us know and we can order pulmonary function testing to look for possible vocal cord dysfunction  Good luck with your surgery. Out of bed as soon as possible afterwards. Use incentive spirometer 10 times an hour while awake during the recovery period. Wear your CPAP during naps and at night. Take your CPAP mask and tubing with you to your surgery.    Follow up in 4 months with Dr. Elsworth Soho or Alanson Aly. If symptoms do not improve or worsen, please contact office for sooner follow up

## 2022-10-29 DIAGNOSIS — G4733 Obstructive sleep apnea (adult) (pediatric): Secondary | ICD-10-CM | POA: Diagnosis not present

## 2022-11-08 DIAGNOSIS — G894 Chronic pain syndrome: Secondary | ICD-10-CM | POA: Diagnosis not present

## 2022-11-08 DIAGNOSIS — M1712 Unilateral primary osteoarthritis, left knee: Secondary | ICD-10-CM | POA: Diagnosis not present

## 2022-11-08 DIAGNOSIS — G35 Multiple sclerosis: Secondary | ICD-10-CM | POA: Diagnosis not present

## 2022-11-08 DIAGNOSIS — M25562 Pain in left knee: Secondary | ICD-10-CM | POA: Diagnosis not present

## 2022-11-12 NOTE — Progress Notes (Deleted)
HPI: FU hypertension and TAA.  Outside echocardiogram October 2019 showed normal LV function and severe left ventricular hypertrophy.  Plan at previous office visit was to control blood pressure and repeat echocardiogram in the future.  I also reviewed previous outside echocardiogram and felt left ventricular hypertrophy was mild with mild proximal septal thickening.  Echocardiogram December 2020 showed normal LV function and moderately dilated ascending aorta measuring 49 mm. CTA January 2024 showed no pulmonary embolus and ascending thoracic aortic aneurysm measured at 4.3 cm and unchanged. Since last seen,  Current Outpatient Medications  Medication Sig Dispense Refill   amphetamine-dextroamphetamine (ADDERALL XR) 30 MG 24 hr capsule Take 30 mg by mouth every morning.     benzonatate (TESSALON) 200 MG capsule Take 1 capsule (200 mg total) by mouth 2 (two) times daily as needed for cough. 20 capsule 0   Cholecalciferol (VITAMIN D3) 125 MCG (5000 UT) TABS Take 1 tablet by mouth daily.     Docusate Sodium 100 MG capsule Take by mouth.     DULoxetine (CYMBALTA) 60 MG capsule Take 60 mg by mouth 2 (two) times daily.      famotidine (PEPCID) 20 MG tablet TAKE ONE TABLET BY MOUTH TWICE DAILY 60 tablet 3   fluticasone (FLONASE) 50 MCG/ACT nasal spray Place 1 spray into both nostrils daily. 18.2 mL 2   furosemide (LASIX) 20 MG tablet Take 1 tablet (20 mg total) by mouth daily as needed. 20 tablet 1   gabapentin (NEURONTIN) 800 MG tablet Take 800 mg by mouth 4 (four) times daily.  11   HYDROcodone-acetaminophen (NORCO) 10-325 MG tablet Take 2 tablets by mouth every 4 (four) hours as needed for severe pain. 40 tablet 0   ibuprofen (ADVIL,MOTRIN) 800 MG tablet Take 800 mg by mouth every 8 (eight) hours as needed for moderate pain.     loratadine (CLARITIN) 10 MG tablet Take by mouth.     losartan-hydrochlorothiazide (HYZAAR) 50-12.5 MG tablet Take 1 tablet by mouth daily. 90 tablet 1   morphine (MS  CONTIN) 60 MG 12 hr tablet Take 60 mg by mouth every 12 (twelve) hours.     predniSONE (DELTASONE) 20 MG tablet Take 1 tablet (20 mg total) by mouth daily with breakfast. 5 tablet 0   tamsulosin (FLOMAX) 0.4 MG CAPS capsule Take 0.4 mg by mouth daily.     Teriflunomide (AUBAGIO) 14 MG TABS Take 1 tablet by mouth at bedtime.     tiZANidine (ZANAFLEX) 4 MG tablet Take 4 mg by mouth every 6 (six) hours as needed for muscle spasms.      No current facility-administered medications for this visit.     Past Medical History:  Diagnosis Date   Anxiety    chronic pain treated with narcotics   Arthritis    Chronic pain    Constipation due to opioid therapy    COPD (chronic obstructive pulmonary disease) (HCC)    Depression    Dysphonia    thoracic left sided"impingement" with heminumbness   Dyspnea    GI bleed    abt 15 years ago   Hypertension    Morbid obesity (HCC)    MS (multiple sclerosis) (HCC) 02/11/2013   ABAGIO  Per. Dr Daphane Shepherd   Neuromuscular disorder [facility]) 2014   Pneumonia    Preoperative respiratory examination 10/18/2022   Sleep apnea    Sleep apnea with use of continuous positive airway pressure (CPAP) 02/11/2013   07-13-12 AHi of 98.6 /hr titrated to 11  cm water ,  average user time 4 hours and 4 minutes. Residual AHI 1.20 December 2012 .   Umbilical hernia     Past Surgical History:  Procedure Laterality Date   ANTERIOR CERVICAL DECOMP/DISCECTOMY FUSION N/A 09/29/2019   Procedure: Anterior Cervical Decompression Fusion - Cervical Four-Cervical Five;  Surgeon: Donalee Citrin, MD;  Location: [facility] OR;  Service: Neurosurgery;  Laterality: N/A;  Anterior Cervical Decompression Fusion - Cervical Four-Cervical Five    CARPAL TUNNEL RELEASE Right    Dr. Aundra Millet TUNNEL RELEASE Right 09/29/2019   Procedure: Carpal Tunnel Release;  Surgeon: Donalee Citrin, MD;  Location: [facility] OR;  Service: Neurosurgery;  Laterality: Right;   COLONOSCOPY W/ POLYPECTOMY     ESOPHAGOGASTRODUODENOSCOPY      MULTIPLE TOOTH EXTRACTIONS     POSTERIOR LUMBAR FUSION  04/17/2015   Dr. Wynetta Emery, also removed old hardware   SPINE SURGERY     total of six vertebras fused, three lumbar surgeries and one anterior neck fusion   TONSILLECTOMY      Social History   Socioeconomic History   Marital status: Married    Spouse name: French Ana   Number of children: 2   Years of education: 12+   Highest education level: Some college, no degree  Occupational History   Occupation: Conservation officer, [employer]    Comment: disablity  Tobacco Use   Smoking status: Never   Smokeless tobacco: Never  Vaping Use   Vaping Use: Never used  Substance and Sexual Activity   Alcohol use: Not Currently    Comment: occasionally   Drug use: No   Sexual activity: Not Currently  Other Topics Concern   Not on file  Social History Narrative   Lives with his wife. They have two children. He enjoys playing video games.      Social Determinants of Health   Financial Resource Strain: Low Risk  (08/23/2022)   Overall Financial Resource Strain (CARDIA)    Difficulty of Paying Living Expenses: Not hard at all  Food Insecurity: No Food Insecurity (08/23/2022)   Hunger Vital Sign    Worried About Running Out of Food in the Last Year: Never true    Ran Out of Food in the Last Year: Never true  Transportation Needs: No Transportation Needs (08/23/2022)   PRAPARE - Administrator, Civil Service (Medical): No    Lack of Transportation (Non-Medical): No  Physical Activity: Inactive (08/23/2022)   Exercise Vital Sign    Days of Exercise per Week: 0 days    Minutes of Exercise per Session: 0 min  Stress: No Stress Concern Present (08/23/2022)   [employer] of Occupational Health - Occupational Stress Questionnaire    Feeling of Stress : Not at all  Social Connections: Moderately Integrated (08/23/2022)   Social Connection and Isolation Panel [NHANES]    Frequency of Communication with Friends and Family: More than three times a week     Frequency of Social Gatherings with Friends and Family: Once a week    Attends Religious Services: More than 4 times per year    Active Member of [employer] or Organizations: No    Attends Banker Meetings: Never    Marital Status: Married  Catering manager Violence: Not At Risk (08/23/2022)   Humiliation, Afraid, Rape, and Kick questionnaire    Fear of Current or Ex-Partner: No    Emotionally Abused: No    Physically Abused: No    Sexually Abused: No  Family History  Problem Relation Age of Onset   Arrhythmia Mother    Cancer Mother    Cancer Other    Cancer Other    Sleep walking Son    Obesity Sister     ROS: no fevers or chills, productive cough, hemoptysis, dysphasia, odynophagia, melena, hematochezia, dysuria, hematuria, rash, seizure activity, orthopnea, PND, pedal edema, claudication. Remaining systems are negative.  Physical Exam: Well-developed well-nourished in no acute distress.  Skin is warm and dry.  HEENT is normal.  Neck is supple.  Chest is clear to auscultation with normal expansion.  Cardiovascular exam is regular rate and rhythm.  Abdominal exam nontender or distended. No masses palpated. Extremities show no edema. neuro grossly intact  ECG- personally reviewed  A/P  1 thoracic aortic aneurysm-patient will need follow-up CTA January 2025.  2 hypertension-patient's blood pressure is controlled.  Continue losartan/HCT.  3 obstructive sleep apnea-continue CPAP.  4 history of obesity-we discussed the importance of weight loss.  Olga Millers, MD

## 2022-11-18 ENCOUNTER — Ambulatory Visit: Payer: Medicare Other | Admitting: Cardiology

## 2022-11-18 DIAGNOSIS — M5416 Radiculopathy, lumbar region: Secondary | ICD-10-CM | POA: Diagnosis not present

## 2022-11-18 DIAGNOSIS — M546 Pain in thoracic spine: Secondary | ICD-10-CM | POA: Diagnosis not present

## 2022-11-18 DIAGNOSIS — M542 Cervicalgia: Secondary | ICD-10-CM | POA: Diagnosis not present

## 2022-11-18 DIAGNOSIS — G35 Multiple sclerosis: Secondary | ICD-10-CM | POA: Diagnosis not present

## 2022-11-18 DIAGNOSIS — M5440 Lumbago with sciatica, unspecified side: Secondary | ICD-10-CM | POA: Diagnosis not present

## 2022-11-19 NOTE — Progress Notes (Signed)
HPI: FU hypertension and TAA.  Outside echocardiogram October 2019 showed normal LV function and severe left ventricular hypertrophy.  Plan at previous office visit was to control blood pressure and repeat echocardiogram in the future.  I also reviewed previous outside echocardiogram and felt left ventricular hypertrophy was mild with mild proximal septal thickening.  Echocardiogram December 2020 showed normal LV function and moderately dilated ascending aorta measuring 49 mm. CTA January 2024 showed no pulmonary embolus and ascending thoracic aortic aneurysm measured at 4.3 cm and unchanged. Since last seen,  Current Outpatient Medications  Medication Sig Dispense Refill   amphetamine-dextroamphetamine (ADDERALL XR) 30 MG 24 hr capsule Take 30 mg by mouth every morning.     benzonatate (TESSALON) 200 MG capsule Take 1 capsule (200 mg total) by mouth 2 (two) times daily as needed for cough. 20 capsule 0   Cholecalciferol (VITAMIN D3) 125 MCG (5000 UT) TABS Take 1 tablet by mouth daily.     Docusate Sodium 100 MG capsule Take by mouth.     DULoxetine (CYMBALTA) 60 MG capsule Take 60 mg by mouth 2 (two) times daily.      famotidine (PEPCID) 20 MG tablet TAKE ONE TABLET BY MOUTH TWICE DAILY 60 tablet 3   fluticasone (FLONASE) 50 MCG/ACT nasal spray Place 1 spray into both nostrils daily. 18.2 mL 2   furosemide (LASIX) 20 MG tablet Take 1 tablet (20 mg total) by mouth daily as needed. 20 tablet 1   gabapentin (NEURONTIN) 800 MG tablet Take 800 mg by mouth 4 (four) times daily.  11   HYDROcodone-acetaminophen (NORCO) 10-325 MG tablet Take 2 tablets by mouth every 4 (four) hours as needed for severe pain. 40 tablet 0   ibuprofen (ADVIL,MOTRIN) 800 MG tablet Take 800 mg by mouth every 8 (eight) hours as needed for moderate pain.     loratadine (CLARITIN) 10 MG tablet Take by mouth.     losartan-hydrochlorothiazide (HYZAAR) 50-12.5 MG tablet Take 1 tablet by mouth daily. 90 tablet 1   morphine (MS  CONTIN) 60 MG 12 hr tablet Take 60 mg by mouth every 12 (twelve) hours.     predniSONE (DELTASONE) 20 MG tablet Take 1 tablet (20 mg total) by mouth daily with breakfast. 5 tablet 0   tamsulosin (FLOMAX) 0.4 MG CAPS capsule Take 0.4 mg by mouth daily.     Teriflunomide (AUBAGIO) 14 MG TABS Take 1 tablet by mouth at bedtime.     tiZANidine (ZANAFLEX) 4 MG tablet Take 4 mg by mouth every 6 (six) hours as needed for muscle spasms.      No current facility-administered medications for this visit.     Past Medical History:  Diagnosis Date   Anxiety    chronic pain treated with narcotics   Arthritis    Chronic pain    Constipation due to opioid therapy    COPD (chronic obstructive pulmonary disease) (HCC)    Depression    Dysphonia    thoracic left sided"impingement" with heminumbness   Dyspnea    GI bleed    abt 15 years ago   Hypertension    Morbid obesity (HCC)    MS (multiple sclerosis) (HCC) 02/11/2013   ABAGIO  Per. Dr Daphane Shepherd   Neuromuscular disorder [facility]) 2014   Pneumonia    Preoperative respiratory examination 10/18/2022   Sleep apnea    Sleep apnea with use of continuous positive airway pressure (CPAP) 02/11/2013   07-13-12 AHi of 98.6 /hr titrated to 11  cm water ,  average user time 4 hours and 4 minutes. Residual AHI 1.20 December 2012 .   Umbilical hernia     Past Surgical History:  Procedure Laterality Date   ANTERIOR CERVICAL DECOMP/DISCECTOMY FUSION N/A 09/29/2019   Procedure: Anterior Cervical Decompression Fusion - Cervical Four-Cervical Five;  Surgeon: Donalee Citrin, MD;  Location: [facility] OR;  Service: Neurosurgery;  Laterality: N/A;  Anterior Cervical Decompression Fusion - Cervical Four-Cervical Five    CARPAL TUNNEL RELEASE Right    Dr. Aundra Millet TUNNEL RELEASE Right 09/29/2019   Procedure: Carpal Tunnel Release;  Surgeon: Donalee Citrin, MD;  Location: [facility] OR;  Service: Neurosurgery;  Laterality: Right;   COLONOSCOPY W/ POLYPECTOMY     ESOPHAGOGASTRODUODENOSCOPY      MULTIPLE TOOTH EXTRACTIONS     POSTERIOR LUMBAR FUSION  04/17/2015   Dr. Wynetta Emery, also removed old hardware   SPINE SURGERY     total of six vertebras fused, three lumbar surgeries and one anterior neck fusion   TONSILLECTOMY      Social History   Socioeconomic History   Marital status: Married    Spouse name: French Ana   Number of children: 2   Years of education: 12+   Highest education level: Some college, no degree  Occupational History   Occupation: Conservation officer, [employer]    Comment: disablity  Tobacco Use   Smoking status: Never   Smokeless tobacco: Never  Vaping Use   Vaping Use: Never used  Substance and Sexual Activity   Alcohol use: Not Currently    Comment: occasionally   Drug use: No   Sexual activity: Not Currently  Other Topics Concern   Not on file  Social History Narrative   Lives with his wife. They have two children. He enjoys playing video games.      Social Determinants of Health   Financial Resource Strain: Low Risk  (08/23/2022)   Overall Financial Resource Strain (CARDIA)    Difficulty of Paying Living Expenses: Not hard at all  Food Insecurity: No Food Insecurity (08/23/2022)   Hunger Vital Sign    Worried About Running Out of Food in the Last Year: Never true    Ran Out of Food in the Last Year: Never true  Transportation Needs: No Transportation Needs (08/23/2022)   PRAPARE - Administrator, Civil Service (Medical): No    Lack of Transportation (Non-Medical): No  Physical Activity: Inactive (08/23/2022)   Exercise Vital Sign    Days of Exercise per Week: 0 days    Minutes of Exercise per Session: 0 min  Stress: No Stress Concern Present (08/23/2022)   [employer] of Occupational Health - Occupational Stress Questionnaire    Feeling of Stress : Not at all  Social Connections: Moderately Integrated (08/23/2022)   Social Connection and Isolation Panel [NHANES]    Frequency of Communication with Friends and Family: More than three times a week     Frequency of Social Gatherings with Friends and Family: Once a week    Attends Religious Services: More than 4 times per year    Active Member of [employer] or Organizations: No    Attends Banker Meetings: Never    Marital Status: Married  Catering manager Violence: Not At Risk (08/23/2022)   Humiliation, Afraid, Rape, and Kick questionnaire    Fear of Current or Ex-Partner: No    Emotionally Abused: No    Physically Abused: No    Sexually Abused: No  Family History  Problem Relation Age of Onset   Arrhythmia Mother    Cancer Mother    Cancer Other    Cancer Other    Sleep walking Son    Obesity Sister     ROS: no fevers or chills, productive cough, hemoptysis, dysphasia, odynophagia, melena, hematochezia, dysuria, hematuria, rash, seizure activity, orthopnea, PND, pedal edema, claudication. Remaining systems are negative.  Physical Exam: Well-developed well-nourished in no acute distress.  Skin is warm and dry.  HEENT is normal.  Neck is supple.  Chest is clear to auscultation with normal expansion.  Cardiovascular exam is regular rate and rhythm.  Abdominal exam nontender or distended. No masses palpated. Extremities show no edema. neuro grossly intact  ECG- personally reviewed  A/P  1 thoracic aortic aneurysm-patient will need follow-up CTA January 2025.  2 hypertension-patient's blood pressure is controlled.  Continue losartan/HCT.  3 obstructive sleep apnea-continue CPAP.  4 history of obesity-we discussed the importance of weight loss.  Olga Millers, MD

## 2022-11-20 ENCOUNTER — Encounter: Payer: Self-pay | Admitting: Cardiology

## 2022-11-20 ENCOUNTER — Ambulatory Visit: Payer: Medicare Other | Attending: Cardiology | Admitting: Cardiology

## 2022-11-20 VITALS — BP 128/68 | HR 90 | Ht 73.0 in | Wt 301.0 lb

## 2022-11-20 DIAGNOSIS — I10 Essential (primary) hypertension: Secondary | ICD-10-CM

## 2022-11-20 DIAGNOSIS — I712 Thoracic aortic aneurysm, without rupture, unspecified: Secondary | ICD-10-CM | POA: Diagnosis not present

## 2022-11-20 NOTE — Patient Instructions (Signed)
    Follow-Up: At [facility], you and your health needs are our priority.  As part of our continuing mission to provide you with exceptional heart care, we have created designated Provider Care Teams.  These Care Teams include your primary Cardiologist (physician) and Advanced Practice Providers (APPs -  Physician Assistants and Nurse Practitioners) who all work together to provide you with the care you need, when you need it.  We recommend signing up for the patient portal called "MyChart".  Sign up information is provided on this After Visit Summary.  MyChart is used to connect with patients for Virtual Visits (Telemedicine).  Patients are able to view lab/test results, encounter notes, upcoming appointments, etc.  Non-urgent messages can be sent to your provider as well.   To learn more about what you can do with MyChart, go to [email or web address].    Your next appointment:   12 month(s)  Provider:   Brian Crenshaw, MD     

## 2022-12-09 ENCOUNTER — Other Ambulatory Visit: Payer: Self-pay | Admitting: Family Medicine

## 2022-12-09 DIAGNOSIS — I10 Essential (primary) hypertension: Secondary | ICD-10-CM

## 2022-12-26 DIAGNOSIS — M5440 Lumbago with sciatica, unspecified side: Secondary | ICD-10-CM | POA: Diagnosis not present

## 2022-12-27 DIAGNOSIS — M25562 Pain in left knee: Secondary | ICD-10-CM | POA: Diagnosis not present

## 2022-12-27 DIAGNOSIS — Z4789 Encounter for other orthopedic aftercare: Secondary | ICD-10-CM | POA: Diagnosis not present

## 2022-12-27 DIAGNOSIS — G8929 Other chronic pain: Secondary | ICD-10-CM | POA: Diagnosis not present

## 2023-01-01 DIAGNOSIS — Z6841 Body Mass Index (BMI) 40.0 and over, adult: Secondary | ICD-10-CM | POA: Diagnosis not present

## 2023-01-01 DIAGNOSIS — M1712 Unilateral primary osteoarthritis, left knee: Secondary | ICD-10-CM | POA: Diagnosis not present

## 2023-01-01 DIAGNOSIS — K219 Gastro-esophageal reflux disease without esophagitis: Secondary | ICD-10-CM | POA: Diagnosis not present

## 2023-01-01 DIAGNOSIS — J449 Chronic obstructive pulmonary disease, unspecified: Secondary | ICD-10-CM | POA: Diagnosis not present

## 2023-01-01 DIAGNOSIS — I10 Essential (primary) hypertension: Secondary | ICD-10-CM | POA: Diagnosis not present

## 2023-01-01 DIAGNOSIS — Z9889 Other specified postprocedural states: Secondary | ICD-10-CM | POA: Diagnosis not present

## 2023-01-01 DIAGNOSIS — Z01812 Encounter for preprocedural laboratory examination: Secondary | ICD-10-CM | POA: Diagnosis not present

## 2023-01-08 DIAGNOSIS — Z6841 Body Mass Index (BMI) 40.0 and over, adult: Secondary | ICD-10-CM | POA: Diagnosis not present

## 2023-01-08 DIAGNOSIS — M1712 Unilateral primary osteoarthritis, left knee: Secondary | ICD-10-CM | POA: Diagnosis not present

## 2023-01-08 DIAGNOSIS — R5382 Chronic fatigue, unspecified: Secondary | ICD-10-CM | POA: Diagnosis not present

## 2023-01-08 DIAGNOSIS — I714 Abdominal aortic aneurysm, without rupture, unspecified: Secondary | ICD-10-CM | POA: Diagnosis not present

## 2023-01-08 DIAGNOSIS — N400 Enlarged prostate without lower urinary tract symptoms: Secondary | ICD-10-CM | POA: Diagnosis not present

## 2023-01-08 DIAGNOSIS — Z79899 Other long term (current) drug therapy: Secondary | ICD-10-CM | POA: Diagnosis not present

## 2023-01-08 DIAGNOSIS — G8381 Brown-Sequard syndrome: Secondary | ICD-10-CM | POA: Diagnosis not present

## 2023-01-08 DIAGNOSIS — J9601 Acute respiratory failure with hypoxia: Secondary | ICD-10-CM | POA: Diagnosis not present

## 2023-01-08 DIAGNOSIS — G8929 Other chronic pain: Secondary | ICD-10-CM | POA: Diagnosis not present

## 2023-01-08 DIAGNOSIS — Z96652 Presence of left artificial knee joint: Secondary | ICD-10-CM | POA: Diagnosis not present

## 2023-01-08 DIAGNOSIS — N529 Male erectile dysfunction, unspecified: Secondary | ICD-10-CM | POA: Diagnosis not present

## 2023-01-08 DIAGNOSIS — G629 Polyneuropathy, unspecified: Secondary | ICD-10-CM | POA: Diagnosis not present

## 2023-01-08 DIAGNOSIS — R609 Edema, unspecified: Secondary | ICD-10-CM | POA: Diagnosis not present

## 2023-01-08 DIAGNOSIS — G4733 Obstructive sleep apnea (adult) (pediatric): Secondary | ICD-10-CM | POA: Diagnosis not present

## 2023-01-08 DIAGNOSIS — Z9989 Dependence on other enabling machines and devices: Secondary | ICD-10-CM | POA: Diagnosis not present

## 2023-01-08 DIAGNOSIS — G8918 Other acute postprocedural pain: Secondary | ICD-10-CM | POA: Diagnosis not present

## 2023-01-08 DIAGNOSIS — Z8616 Personal history of COVID-19: Secondary | ICD-10-CM | POA: Diagnosis not present

## 2023-01-08 DIAGNOSIS — Z79891 Long term (current) use of opiate analgesic: Secondary | ICD-10-CM | POA: Diagnosis not present

## 2023-01-08 DIAGNOSIS — K219 Gastro-esophageal reflux disease without esophagitis: Secondary | ICD-10-CM | POA: Diagnosis not present

## 2023-01-08 DIAGNOSIS — R0989 Other specified symptoms and signs involving the circulatory and respiratory systems: Secondary | ICD-10-CM | POA: Diagnosis not present

## 2023-01-08 DIAGNOSIS — Z471 Aftercare following joint replacement surgery: Secondary | ICD-10-CM | POA: Diagnosis not present

## 2023-01-08 DIAGNOSIS — G35 Multiple sclerosis: Secondary | ICD-10-CM | POA: Diagnosis not present

## 2023-01-08 DIAGNOSIS — R0603 Acute respiratory distress: Secondary | ICD-10-CM | POA: Diagnosis not present

## 2023-01-08 DIAGNOSIS — D62 Acute posthemorrhagic anemia: Secondary | ICD-10-CM | POA: Diagnosis not present

## 2023-01-08 DIAGNOSIS — J986 Disorders of diaphragm: Secondary | ICD-10-CM | POA: Diagnosis not present

## 2023-01-08 DIAGNOSIS — M67462 Ganglion, left knee: Secondary | ICD-10-CM | POA: Diagnosis not present

## 2023-01-08 DIAGNOSIS — I10 Essential (primary) hypertension: Secondary | ICD-10-CM | POA: Diagnosis not present

## 2023-01-08 DIAGNOSIS — J449 Chronic obstructive pulmonary disease, unspecified: Secondary | ICD-10-CM | POA: Diagnosis not present

## 2023-01-08 DIAGNOSIS — Z7409 Other reduced mobility: Secondary | ICD-10-CM | POA: Diagnosis not present

## 2023-01-08 DIAGNOSIS — Z7982 Long term (current) use of aspirin: Secondary | ICD-10-CM | POA: Diagnosis not present

## 2023-01-10 ENCOUNTER — Telehealth: Payer: Self-pay | Admitting: *Deleted

## 2023-01-10 ENCOUNTER — Encounter: Payer: Self-pay | Admitting: *Deleted

## 2023-01-10 DIAGNOSIS — Z96652 Presence of left artificial knee joint: Secondary | ICD-10-CM | POA: Diagnosis not present

## 2023-01-10 DIAGNOSIS — N400 Enlarged prostate without lower urinary tract symptoms: Secondary | ICD-10-CM | POA: Diagnosis not present

## 2023-01-10 DIAGNOSIS — Z7982 Long term (current) use of aspirin: Secondary | ICD-10-CM | POA: Diagnosis not present

## 2023-01-10 DIAGNOSIS — M1712 Unilateral primary osteoarthritis, left knee: Secondary | ICD-10-CM | POA: Diagnosis not present

## 2023-01-10 DIAGNOSIS — I890 Lymphedema, not elsewhere classified: Secondary | ICD-10-CM | POA: Diagnosis not present

## 2023-01-10 DIAGNOSIS — I714 Abdominal aortic aneurysm, without rupture, unspecified: Secondary | ICD-10-CM | POA: Diagnosis not present

## 2023-01-10 DIAGNOSIS — M25562 Pain in left knee: Secondary | ICD-10-CM | POA: Diagnosis not present

## 2023-01-10 DIAGNOSIS — G35 Multiple sclerosis: Secondary | ICD-10-CM | POA: Diagnosis not present

## 2023-01-10 DIAGNOSIS — Z79891 Long term (current) use of opiate analgesic: Secondary | ICD-10-CM | POA: Diagnosis not present

## 2023-01-10 DIAGNOSIS — N529 Male erectile dysfunction, unspecified: Secondary | ICD-10-CM | POA: Diagnosis not present

## 2023-01-10 DIAGNOSIS — K219 Gastro-esophageal reflux disease without esophagitis: Secondary | ICD-10-CM | POA: Diagnosis not present

## 2023-01-10 DIAGNOSIS — G629 Polyneuropathy, unspecified: Secondary | ICD-10-CM | POA: Diagnosis not present

## 2023-01-10 DIAGNOSIS — G8929 Other chronic pain: Secondary | ICD-10-CM | POA: Diagnosis not present

## 2023-01-10 DIAGNOSIS — I10 Essential (primary) hypertension: Secondary | ICD-10-CM | POA: Diagnosis not present

## 2023-01-10 DIAGNOSIS — J449 Chronic obstructive pulmonary disease, unspecified: Secondary | ICD-10-CM | POA: Diagnosis not present

## 2023-01-10 DIAGNOSIS — Z471 Aftercare following joint replacement surgery: Secondary | ICD-10-CM | POA: Diagnosis not present

## 2023-01-10 DIAGNOSIS — Z791 Long term (current) use of non-steroidal anti-inflammatories (NSAID): Secondary | ICD-10-CM | POA: Diagnosis not present

## 2023-01-10 NOTE — Transitions of Care (Post Inpatient/ED Visit) (Signed)
01/10/2023  Name: Mark Johns Beecher Jr. MRN: [id number] DOB: 02-08-1961  Today's TOC FU Call Status: Today's TOC FU Call Status:: Successful TOC FU Call Competed TOC FU Call Complete Date: 01/10/23  Transition Care Management Follow-up Telephone Call Date of Discharge: 01/09/23 Discharge Facility: Other ([facility]) Name of Other ([facility]) Discharge Facility: [facility] Type of Discharge: Inpatient Admission Primary Inpatient Discharge Diagnosis:: (L) TKR How have you been since you were released from the hospital?: Better (per spouse: "He is doing much better; we have no questions or concerns; the PT came out today; we got all the new medications and he is taking it like he is supposed to") Any questions or concerns?: No  Items Reviewed: Did you receive and understand the discharge instructions provided?: Yes (briefly reviewed with patient/ spouse who verbalizes good understanding of same) Medications obtained,verified, and reconciled?: Partial Review Completed Reason for Partial Mediation Review: spouse declined full medication review- not currently near medications; verified obtained and is taking all newly prescribed medications-- added to medication list accordingly; spouse manages medications and denies questions/ concerns around medications today Any new allergies since your discharge?: No Dietary orders reviewed?: Yes Type of Diet Ordered:: Regular Do you have support at home?: Yes People in Home: spouse Name of Support/Comfort Primary Source: Reports essentially independent in self-care activities; supportive spouse assists as/ if needed/ indicated  Medications Reviewed Today: Medications Reviewed Today     Reviewed by Michaela Corner, RN (Registered Nurse) on 01/10/23 at 1504  Med List Status: <None>   Medication Order Taking? Sig Documenting Provider Last Dose Status Informant  amantadine (SYMMETREL) 100 MG capsule [id number]  Take 100 mg by mouth 3 (three) times  daily. [provider]  Active   amphetamine-dextroamphetamine (ADDERALL XR) 30 MG 24 hr capsule [id number]  Take 30 mg by mouth every morning. [provider]  Active Self, Pharmacy Records  benzonatate (TESSALON) 200 MG capsule [id number]  Take 1 capsule (200 mg total) by mouth 2 (two) times daily as needed for cough. Agapito Games, MD  Active   Cholecalciferol (VITAMIN D3) 125 MCG (5000 UT) TABS [id number]  Take 1 tablet by mouth daily. [provider]  Active Self, Pharmacy Records  Docusate Sodium 100 MG capsule [id number]  Take by mouth. [provider]  Active Self, Pharmacy Records  DULoxetine (CYMBALTA) 60 MG capsule [id number]  Take 60 mg by mouth 2 (two) times daily.  [provider]  Active Self, Pharmacy Records  famotidine (PEPCID) 20 MG tablet [id number]  TAKE ONE TABLET BY MOUTH TWICE DAILY Cobb, Ruby Cola, NP  Active   fluticasone (FLONASE) 50 MCG/ACT nasal spray [id number]  Place 1 spray into both nostrils daily. Noemi Chapel, NP  Active Self, Pharmacy Records  furosemide (LASIX) 20 MG tablet [id number]  Take 1 tablet (20 mg total) by mouth daily as needed. Agapito Games, MD  Active Self, Pharmacy Records  gabapentin (NEURONTIN) 800 MG tablet [id number]  Take 800 mg by mouth 4 (four) times daily. [provider]  Active Self, Pharmacy Records  HYDROcodone-acetaminophen [facility]) 10-325 MG tablet [id number]  Take 2 tablets by mouth every 4 (four) hours as needed for severe pain. Meyran, Tiana Loft, NP  Active Self, Pharmacy Records  ibuprofen (ADVIL,MOTRIN) 800 MG tablet [id number]  Take 800 mg by mouth every 8 (eight) hours as needed for moderate pain. [provider]  Active Self, Pharmacy Records  loratadine (CLARITIN) 10 MG tablet [id number]  Take by mouth. [provider]  Active Self, Pharmacy Records  losartan-hydrochlorothiazide [facility]) 50-12.5 MG tablet [id number]  TAKE ONE TABLET BY MOUTH  EVERY DAY Agapito Games, MD  Active   morphine (MS CONTIN) 60 MG 12 hr tablet [id number]  Take 60 mg by mouth every 12 (twelve) hours. [provider]  Active Self, Pharmacy Records  predniSONE (DELTASONE) 20 MG tablet [id number]  Take 1 tablet (20 mg total) by mouth daily with breakfast. Agapito Games, MD  Active   tamsulosin [facility]) 0.4 MG CAPS capsule [id number]  Take 0.4 mg by mouth daily. [provider]  Active Self, Pharmacy Records  Teriflunomide (AUBAGIO) 14 MG TABS [id number]  Take 1 tablet by mouth at bedtime. [provider]  Active Self, Pharmacy Records  tiZANidine (ZANAFLEX) 4 MG tablet [id number]  Take 4 mg by mouth every 6 (six) hours as needed for muscle spasms.  [provider]  Active Self, Pharmacy Records  Med List Note Halford Decamp, CPhT 05/10/16 1303): Patient uses a CPAP machine at night            Home Care and Equipment/Supplies: Were Home Health Services Ordered?: Yes Name of Home Health Agency:: [facility]- PT Has Agency set up a time to come to your home?: Yes First Home Health Visit Date: 01/10/23 Any new equipment or medical supplies ordered?: Yes (cooling machone for knee post-TKR; compression machine for DVT prevention for (R) leg) Name of Medical supply agency?: [facility] Were you able to get the equipment/medical supplies?: Yes Do you have any questions related to the use of the equipment/supplies?: No  Functional Questionnaire: Do you need assistance with bathing/showering or dressing?: Yes (wife assisting as indicated) Do you need assistance with meal preparation?: Yes (wife assisting as indicated) Do you need assistance with eating?: No Do you have difficulty maintaining continence: No Do you need assistance with getting out of bed/getting out of a chair/moving?: Yes (wife assisting as indicated) Do you have difficulty managing or taking your medications?: Yes (wife manages all aspects  of medication administration)  Follow up appointments reviewed: PCP Follow-up appointment confirmed?: NA (verified not indicated per hospital discharging provider discharge notes) Specialist Hospital Follow-up appointment confirmed?: Yes (verified this is recommended time frame for follow up per hospital discharging provider notes) Date of Specialist follow-up appointment?: 01/24/23 Follow-Up Specialty Provider:: orthopedic [facility]- [facility] Do you need transportation to your follow-up appointment?: No Do you understand care options if your condition(s) worsen?: Yes-patient verbalized understanding  SDOH Interventions Today    Flowsheet Row Most Recent Value  SDOH Interventions   Food Insecurity Interventions Intervention Not Indicated  Transportation Interventions Intervention Not Indicated  [wife providing transportation post-recent surgery]      TOC Interventions Today    Flowsheet Row Most Recent Value  TOC Interventions   TOC Interventions Discussed/Reviewed TOC Interventions Discussed      Interventions Today    Flowsheet Row Most Recent Value  Chronic Disease   Chronic disease during today's visit Other  [(L) TKR]  General Interventions   General Interventions Discussed/Reviewed General Interventions Discussed, Doctor Visits, Durable Medical Equipment (DME)  Doctor Visits Discussed/Reviewed Specialist, Doctor Visits Discussed, PCP  Durable Medical Equipment (DME) Val Riles obtained and is using walker post- recent TKR,  also obtained and is using cooling machine and knee compression machine for DVT prevention]  PCP/Specialist Visits Compliance with follow-up visit  Nutrition Interventions   Nutrition Discussed/Reviewed Nutrition Discussed  Pharmacy Interventions   Pharmacy Dicussed/Reviewed Pharmacy Topics Discussed  Safety Interventions  Safety Discussed/Reviewed Safety Discussed      Caryl Pina, RN, BSN, CCRN Alumnus RN CM Care Coordination/  Transition of Care- [facility] Care Management [phone]: direct office

## 2023-01-12 DIAGNOSIS — Z471 Aftercare following joint replacement surgery: Secondary | ICD-10-CM | POA: Diagnosis not present

## 2023-01-12 DIAGNOSIS — Z96652 Presence of left artificial knee joint: Secondary | ICD-10-CM | POA: Diagnosis not present

## 2023-01-12 DIAGNOSIS — J449 Chronic obstructive pulmonary disease, unspecified: Secondary | ICD-10-CM | POA: Diagnosis not present

## 2023-01-12 DIAGNOSIS — Z7982 Long term (current) use of aspirin: Secondary | ICD-10-CM | POA: Diagnosis not present

## 2023-01-12 DIAGNOSIS — K219 Gastro-esophageal reflux disease without esophagitis: Secondary | ICD-10-CM | POA: Diagnosis not present

## 2023-01-12 DIAGNOSIS — I10 Essential (primary) hypertension: Secondary | ICD-10-CM | POA: Diagnosis not present

## 2023-01-12 DIAGNOSIS — N400 Enlarged prostate without lower urinary tract symptoms: Secondary | ICD-10-CM | POA: Diagnosis not present

## 2023-01-12 DIAGNOSIS — N529 Male erectile dysfunction, unspecified: Secondary | ICD-10-CM | POA: Diagnosis not present

## 2023-01-12 DIAGNOSIS — G629 Polyneuropathy, unspecified: Secondary | ICD-10-CM | POA: Diagnosis not present

## 2023-01-12 DIAGNOSIS — Z79891 Long term (current) use of opiate analgesic: Secondary | ICD-10-CM | POA: Diagnosis not present

## 2023-01-12 DIAGNOSIS — G8929 Other chronic pain: Secondary | ICD-10-CM | POA: Diagnosis not present

## 2023-01-12 DIAGNOSIS — I714 Abdominal aortic aneurysm, without rupture, unspecified: Secondary | ICD-10-CM | POA: Diagnosis not present

## 2023-01-12 DIAGNOSIS — G35 Multiple sclerosis: Secondary | ICD-10-CM | POA: Diagnosis not present

## 2023-01-12 DIAGNOSIS — Z791 Long term (current) use of non-steroidal anti-inflammatories (NSAID): Secondary | ICD-10-CM | POA: Diagnosis not present

## 2023-01-13 DIAGNOSIS — N400 Enlarged prostate without lower urinary tract symptoms: Secondary | ICD-10-CM | POA: Diagnosis not present

## 2023-01-13 DIAGNOSIS — G629 Polyneuropathy, unspecified: Secondary | ICD-10-CM | POA: Diagnosis not present

## 2023-01-13 DIAGNOSIS — Z791 Long term (current) use of non-steroidal anti-inflammatories (NSAID): Secondary | ICD-10-CM | POA: Diagnosis not present

## 2023-01-13 DIAGNOSIS — I714 Abdominal aortic aneurysm, without rupture, unspecified: Secondary | ICD-10-CM | POA: Diagnosis not present

## 2023-01-13 DIAGNOSIS — G35 Multiple sclerosis: Secondary | ICD-10-CM | POA: Diagnosis not present

## 2023-01-13 DIAGNOSIS — J449 Chronic obstructive pulmonary disease, unspecified: Secondary | ICD-10-CM | POA: Diagnosis not present

## 2023-01-13 DIAGNOSIS — K219 Gastro-esophageal reflux disease without esophagitis: Secondary | ICD-10-CM | POA: Diagnosis not present

## 2023-01-13 DIAGNOSIS — I10 Essential (primary) hypertension: Secondary | ICD-10-CM | POA: Diagnosis not present

## 2023-01-13 DIAGNOSIS — Z471 Aftercare following joint replacement surgery: Secondary | ICD-10-CM | POA: Diagnosis not present

## 2023-01-13 DIAGNOSIS — Z96652 Presence of left artificial knee joint: Secondary | ICD-10-CM | POA: Diagnosis not present

## 2023-01-13 DIAGNOSIS — N529 Male erectile dysfunction, unspecified: Secondary | ICD-10-CM | POA: Diagnosis not present

## 2023-01-13 DIAGNOSIS — G8929 Other chronic pain: Secondary | ICD-10-CM | POA: Diagnosis not present

## 2023-01-13 DIAGNOSIS — Z7982 Long term (current) use of aspirin: Secondary | ICD-10-CM | POA: Diagnosis not present

## 2023-01-13 DIAGNOSIS — Z79891 Long term (current) use of opiate analgesic: Secondary | ICD-10-CM | POA: Diagnosis not present

## 2023-01-15 DIAGNOSIS — Z471 Aftercare following joint replacement surgery: Secondary | ICD-10-CM | POA: Diagnosis not present

## 2023-01-15 DIAGNOSIS — G8929 Other chronic pain: Secondary | ICD-10-CM | POA: Diagnosis not present

## 2023-01-15 DIAGNOSIS — I714 Abdominal aortic aneurysm, without rupture, unspecified: Secondary | ICD-10-CM | POA: Diagnosis not present

## 2023-01-15 DIAGNOSIS — Z79891 Long term (current) use of opiate analgesic: Secondary | ICD-10-CM | POA: Diagnosis not present

## 2023-01-15 DIAGNOSIS — Z96652 Presence of left artificial knee joint: Secondary | ICD-10-CM | POA: Diagnosis not present

## 2023-01-15 DIAGNOSIS — N529 Male erectile dysfunction, unspecified: Secondary | ICD-10-CM | POA: Diagnosis not present

## 2023-01-15 DIAGNOSIS — G35 Multiple sclerosis: Secondary | ICD-10-CM | POA: Diagnosis not present

## 2023-01-15 DIAGNOSIS — Z791 Long term (current) use of non-steroidal anti-inflammatories (NSAID): Secondary | ICD-10-CM | POA: Diagnosis not present

## 2023-01-15 DIAGNOSIS — I10 Essential (primary) hypertension: Secondary | ICD-10-CM | POA: Diagnosis not present

## 2023-01-15 DIAGNOSIS — N400 Enlarged prostate without lower urinary tract symptoms: Secondary | ICD-10-CM | POA: Diagnosis not present

## 2023-01-15 DIAGNOSIS — J449 Chronic obstructive pulmonary disease, unspecified: Secondary | ICD-10-CM | POA: Diagnosis not present

## 2023-01-15 DIAGNOSIS — G629 Polyneuropathy, unspecified: Secondary | ICD-10-CM | POA: Diagnosis not present

## 2023-01-15 DIAGNOSIS — Z7982 Long term (current) use of aspirin: Secondary | ICD-10-CM | POA: Diagnosis not present

## 2023-01-15 DIAGNOSIS — K219 Gastro-esophageal reflux disease without esophagitis: Secondary | ICD-10-CM | POA: Diagnosis not present

## 2023-01-17 DIAGNOSIS — Z96652 Presence of left artificial knee joint: Secondary | ICD-10-CM | POA: Diagnosis not present

## 2023-01-17 DIAGNOSIS — Z791 Long term (current) use of non-steroidal anti-inflammatories (NSAID): Secondary | ICD-10-CM | POA: Diagnosis not present

## 2023-01-17 DIAGNOSIS — I714 Abdominal aortic aneurysm, without rupture, unspecified: Secondary | ICD-10-CM | POA: Diagnosis not present

## 2023-01-17 DIAGNOSIS — G8929 Other chronic pain: Secondary | ICD-10-CM | POA: Diagnosis not present

## 2023-01-17 DIAGNOSIS — N400 Enlarged prostate without lower urinary tract symptoms: Secondary | ICD-10-CM | POA: Diagnosis not present

## 2023-01-17 DIAGNOSIS — I10 Essential (primary) hypertension: Secondary | ICD-10-CM | POA: Diagnosis not present

## 2023-01-17 DIAGNOSIS — Z471 Aftercare following joint replacement surgery: Secondary | ICD-10-CM | POA: Diagnosis not present

## 2023-01-17 DIAGNOSIS — Z79891 Long term (current) use of opiate analgesic: Secondary | ICD-10-CM | POA: Diagnosis not present

## 2023-01-17 DIAGNOSIS — Z7982 Long term (current) use of aspirin: Secondary | ICD-10-CM | POA: Diagnosis not present

## 2023-01-17 DIAGNOSIS — K219 Gastro-esophageal reflux disease without esophagitis: Secondary | ICD-10-CM | POA: Diagnosis not present

## 2023-01-17 DIAGNOSIS — N529 Male erectile dysfunction, unspecified: Secondary | ICD-10-CM | POA: Diagnosis not present

## 2023-01-17 DIAGNOSIS — J449 Chronic obstructive pulmonary disease, unspecified: Secondary | ICD-10-CM | POA: Diagnosis not present

## 2023-01-17 DIAGNOSIS — G629 Polyneuropathy, unspecified: Secondary | ICD-10-CM | POA: Diagnosis not present

## 2023-01-17 DIAGNOSIS — G35 Multiple sclerosis: Secondary | ICD-10-CM | POA: Diagnosis not present

## 2023-01-20 ENCOUNTER — Other Ambulatory Visit: Payer: Self-pay | Admitting: Nurse Practitioner

## 2023-01-20 DIAGNOSIS — I10 Essential (primary) hypertension: Secondary | ICD-10-CM | POA: Diagnosis not present

## 2023-01-20 DIAGNOSIS — I714 Abdominal aortic aneurysm, without rupture, unspecified: Secondary | ICD-10-CM | POA: Diagnosis not present

## 2023-01-20 DIAGNOSIS — J449 Chronic obstructive pulmonary disease, unspecified: Secondary | ICD-10-CM | POA: Diagnosis not present

## 2023-01-20 DIAGNOSIS — Z471 Aftercare following joint replacement surgery: Secondary | ICD-10-CM | POA: Diagnosis not present

## 2023-01-20 DIAGNOSIS — G35 Multiple sclerosis: Secondary | ICD-10-CM | POA: Diagnosis not present

## 2023-01-20 DIAGNOSIS — Z7982 Long term (current) use of aspirin: Secondary | ICD-10-CM | POA: Diagnosis not present

## 2023-01-20 DIAGNOSIS — G629 Polyneuropathy, unspecified: Secondary | ICD-10-CM | POA: Diagnosis not present

## 2023-01-20 DIAGNOSIS — N529 Male erectile dysfunction, unspecified: Secondary | ICD-10-CM | POA: Diagnosis not present

## 2023-01-20 DIAGNOSIS — N400 Enlarged prostate without lower urinary tract symptoms: Secondary | ICD-10-CM | POA: Diagnosis not present

## 2023-01-20 DIAGNOSIS — Z791 Long term (current) use of non-steroidal anti-inflammatories (NSAID): Secondary | ICD-10-CM | POA: Diagnosis not present

## 2023-01-20 DIAGNOSIS — G8929 Other chronic pain: Secondary | ICD-10-CM | POA: Diagnosis not present

## 2023-01-20 DIAGNOSIS — K219 Gastro-esophageal reflux disease without esophagitis: Secondary | ICD-10-CM | POA: Diagnosis not present

## 2023-01-20 DIAGNOSIS — Z96652 Presence of left artificial knee joint: Secondary | ICD-10-CM | POA: Diagnosis not present

## 2023-01-20 DIAGNOSIS — Z79891 Long term (current) use of opiate analgesic: Secondary | ICD-10-CM | POA: Diagnosis not present

## 2023-01-22 DIAGNOSIS — G8929 Other chronic pain: Secondary | ICD-10-CM | POA: Diagnosis not present

## 2023-01-22 DIAGNOSIS — I714 Abdominal aortic aneurysm, without rupture, unspecified: Secondary | ICD-10-CM | POA: Diagnosis not present

## 2023-01-22 DIAGNOSIS — Z471 Aftercare following joint replacement surgery: Secondary | ICD-10-CM | POA: Diagnosis not present

## 2023-01-22 DIAGNOSIS — J449 Chronic obstructive pulmonary disease, unspecified: Secondary | ICD-10-CM | POA: Diagnosis not present

## 2023-01-22 DIAGNOSIS — G629 Polyneuropathy, unspecified: Secondary | ICD-10-CM | POA: Diagnosis not present

## 2023-01-22 DIAGNOSIS — Z791 Long term (current) use of non-steroidal anti-inflammatories (NSAID): Secondary | ICD-10-CM | POA: Diagnosis not present

## 2023-01-22 DIAGNOSIS — I10 Essential (primary) hypertension: Secondary | ICD-10-CM | POA: Diagnosis not present

## 2023-01-22 DIAGNOSIS — N400 Enlarged prostate without lower urinary tract symptoms: Secondary | ICD-10-CM | POA: Diagnosis not present

## 2023-01-22 DIAGNOSIS — Z96652 Presence of left artificial knee joint: Secondary | ICD-10-CM | POA: Diagnosis not present

## 2023-01-22 DIAGNOSIS — Z79891 Long term (current) use of opiate analgesic: Secondary | ICD-10-CM | POA: Diagnosis not present

## 2023-01-22 DIAGNOSIS — Z7982 Long term (current) use of aspirin: Secondary | ICD-10-CM | POA: Diagnosis not present

## 2023-01-22 DIAGNOSIS — K219 Gastro-esophageal reflux disease without esophagitis: Secondary | ICD-10-CM | POA: Diagnosis not present

## 2023-01-22 DIAGNOSIS — N529 Male erectile dysfunction, unspecified: Secondary | ICD-10-CM | POA: Diagnosis not present

## 2023-01-22 DIAGNOSIS — G35 Multiple sclerosis: Secondary | ICD-10-CM | POA: Diagnosis not present

## 2023-01-24 DIAGNOSIS — Z4789 Encounter for other orthopedic aftercare: Secondary | ICD-10-CM | POA: Diagnosis not present

## 2023-01-30 DIAGNOSIS — M1732 Unilateral post-traumatic osteoarthritis, left knee: Secondary | ICD-10-CM | POA: Diagnosis not present

## 2023-01-30 DIAGNOSIS — M1712 Unilateral primary osteoarthritis, left knee: Secondary | ICD-10-CM | POA: Diagnosis not present

## 2023-01-30 DIAGNOSIS — M25562 Pain in left knee: Secondary | ICD-10-CM | POA: Diagnosis not present

## 2023-01-30 DIAGNOSIS — G8929 Other chronic pain: Secondary | ICD-10-CM | POA: Diagnosis not present

## 2023-02-04 DIAGNOSIS — M1712 Unilateral primary osteoarthritis, left knee: Secondary | ICD-10-CM | POA: Diagnosis not present

## 2023-02-04 DIAGNOSIS — M25562 Pain in left knee: Secondary | ICD-10-CM | POA: Diagnosis not present

## 2023-02-04 DIAGNOSIS — M1732 Unilateral post-traumatic osteoarthritis, left knee: Secondary | ICD-10-CM | POA: Diagnosis not present

## 2023-02-04 DIAGNOSIS — G8929 Other chronic pain: Secondary | ICD-10-CM | POA: Diagnosis not present

## 2023-02-06 ENCOUNTER — Encounter: Payer: Self-pay | Admitting: Podiatry

## 2023-02-06 ENCOUNTER — Ambulatory Visit: Payer: Medicare Other | Admitting: Podiatry

## 2023-02-06 DIAGNOSIS — B070 Plantar wart: Secondary | ICD-10-CM | POA: Diagnosis not present

## 2023-02-06 DIAGNOSIS — Q828 Other specified congenital malformations of skin: Secondary | ICD-10-CM

## 2023-02-06 DIAGNOSIS — G8929 Other chronic pain: Secondary | ICD-10-CM | POA: Diagnosis not present

## 2023-02-06 DIAGNOSIS — M25562 Pain in left knee: Secondary | ICD-10-CM | POA: Diagnosis not present

## 2023-02-06 DIAGNOSIS — M1732 Unilateral post-traumatic osteoarthritis, left knee: Secondary | ICD-10-CM | POA: Diagnosis not present

## 2023-02-06 DIAGNOSIS — M1712 Unilateral primary osteoarthritis, left knee: Secondary | ICD-10-CM | POA: Diagnosis not present

## 2023-02-06 NOTE — Progress Notes (Signed)
  Subjective:  Patient ID: Mark Dimitri., male    DOB: [birth year],   MRN: [id number]  No chief complaint on file.   62 y.o. male presents for follow-up of lesion on the bottom of his feet. Marland Kitchen He is not diabetic but he does have neuropathy as a sequelae from previous back pain and subsequent surgeries. Also has a history of MS. He is currently taking gabapentin for the neuropathy. Denies any other pedal complaints. Denies n/v/f/c.   Past Medical History:  Diagnosis Date   Anxiety    chronic pain treated with narcotics   Arthritis    Chronic pain    Constipation due to opioid therapy    COPD (chronic obstructive pulmonary disease) (HCC)    Depression    Dysphonia    thoracic left sided"impingement" with heminumbness   Dyspnea    GI bleed    abt 15 years ago   Hypertension    Morbid obesity (HCC)    MS (multiple sclerosis) (HCC) 02/11/2013   ABAGIO  Per. Dr Daphane Shepherd   Neuromuscular disorder [facility]) 2014   Pneumonia    Preoperative respiratory examination 10/18/2022   Sleep apnea    Sleep apnea with use of continuous positive airway pressure (CPAP) 02/11/2013   07-13-12 AHi of 98.6 /hr titrated to 11 cm water ,  average user time 4 hours and 4 minutes. Residual AHI 1.20 December 2012 .   Umbilical hernia     Objective:  Physical Exam: Vascular: DP/PT pulses 2/4 bilateral. CFT <3 seconds. Normal hair growth on digits. No edema.  Skin. No lacerations or abrasions bilateral feet. Hyperkeratotic cored tissue noted to plantar lateral feet bilateral. On right today there is some noted pinpoint bleeding and some disruption of the skin lines.  Musculoskeletal: MMT 5/5 bilateral lower extremities in DF, PF, Inversion and Eversion. Deceased ROM in DF of ankle joint.  Neurological: Sensation intact to light touch.   Assessment:   1. Plantar wart   2. Porokeratosis       Plan:  Patient was evaluated and treated and all questions answered. -Discussed warts and porokertosis and their  etiology with patient and treatment options.  -Hyperkeratotic tissue was debrided with chisel without incident.  -Applied salycylic acid treatment to area with dressing. Advised to remove bandaging tomorrow.  -Discussed future options such as laser treatment if unsuccessful.  -Advised good supportive shoes and inserts -Patient to return to office if needed after knee surgery.  Louann Sjogren, DPM

## 2023-02-10 DIAGNOSIS — Z96652 Presence of left artificial knee joint: Secondary | ICD-10-CM | POA: Diagnosis not present

## 2023-02-10 DIAGNOSIS — G4733 Obstructive sleep apnea (adult) (pediatric): Secondary | ICD-10-CM | POA: Diagnosis not present

## 2023-02-10 DIAGNOSIS — B957 Other staphylococcus as the cause of diseases classified elsewhere: Secondary | ICD-10-CM | POA: Diagnosis not present

## 2023-02-10 DIAGNOSIS — J449 Chronic obstructive pulmonary disease, unspecified: Secondary | ICD-10-CM | POA: Diagnosis not present

## 2023-02-10 DIAGNOSIS — G35 Multiple sclerosis: Secondary | ICD-10-CM | POA: Diagnosis not present

## 2023-02-10 DIAGNOSIS — T8454XD Infection and inflammatory reaction due to internal left knee prosthesis, subsequent encounter: Secondary | ICD-10-CM | POA: Diagnosis not present

## 2023-02-13 DIAGNOSIS — G8929 Other chronic pain: Secondary | ICD-10-CM | POA: Diagnosis not present

## 2023-02-13 DIAGNOSIS — M1732 Unilateral post-traumatic osteoarthritis, left knee: Secondary | ICD-10-CM | POA: Diagnosis not present

## 2023-02-13 DIAGNOSIS — M1712 Unilateral primary osteoarthritis, left knee: Secondary | ICD-10-CM | POA: Diagnosis not present

## 2023-02-13 DIAGNOSIS — M25562 Pain in left knee: Secondary | ICD-10-CM | POA: Diagnosis not present

## 2023-02-17 ENCOUNTER — Ambulatory Visit: Payer: Medicare Other | Admitting: Nurse Practitioner

## 2023-02-18 ENCOUNTER — Other Ambulatory Visit: Payer: Self-pay | Admitting: Neurosurgery

## 2023-02-18 DIAGNOSIS — M1712 Unilateral primary osteoarthritis, left knee: Secondary | ICD-10-CM | POA: Diagnosis not present

## 2023-02-18 DIAGNOSIS — M1732 Unilateral post-traumatic osteoarthritis, left knee: Secondary | ICD-10-CM | POA: Diagnosis not present

## 2023-02-18 DIAGNOSIS — M5440 Lumbago with sciatica, unspecified side: Secondary | ICD-10-CM

## 2023-02-18 DIAGNOSIS — G8929 Other chronic pain: Secondary | ICD-10-CM | POA: Diagnosis not present

## 2023-02-18 DIAGNOSIS — M25562 Pain in left knee: Secondary | ICD-10-CM | POA: Diagnosis not present

## 2023-02-20 ENCOUNTER — Ambulatory Visit: Payer: Medicare Other | Admitting: Nurse Practitioner

## 2023-02-20 ENCOUNTER — Ambulatory Visit (INDEPENDENT_AMBULATORY_CARE_PROVIDER_SITE_OTHER): Payer: Medicare Other

## 2023-02-20 ENCOUNTER — Encounter: Payer: Self-pay | Admitting: Nurse Practitioner

## 2023-02-20 VITALS — BP 120/80 | HR 74 | Ht 73.0 in | Wt 294.0 lb

## 2023-02-20 DIAGNOSIS — J387 Other diseases of larynx: Secondary | ICD-10-CM | POA: Diagnosis not present

## 2023-02-20 DIAGNOSIS — R0609 Other forms of dyspnea: Secondary | ICD-10-CM

## 2023-02-20 DIAGNOSIS — F1120 Opioid dependence, uncomplicated: Secondary | ICD-10-CM | POA: Diagnosis not present

## 2023-02-20 DIAGNOSIS — G4733 Obstructive sleep apnea (adult) (pediatric): Secondary | ICD-10-CM | POA: Diagnosis not present

## 2023-02-20 DIAGNOSIS — R0602 Shortness of breath: Secondary | ICD-10-CM | POA: Diagnosis not present

## 2023-02-20 DIAGNOSIS — R7989 Other specified abnormal findings of blood chemistry: Secondary | ICD-10-CM

## 2023-02-20 DIAGNOSIS — M5440 Lumbago with sciatica, unspecified side: Secondary | ICD-10-CM | POA: Diagnosis not present

## 2023-02-20 DIAGNOSIS — Z981 Arthrodesis status: Secondary | ICD-10-CM | POA: Diagnosis not present

## 2023-02-20 DIAGNOSIS — M546 Pain in thoracic spine: Secondary | ICD-10-CM | POA: Diagnosis not present

## 2023-02-20 DIAGNOSIS — M542 Cervicalgia: Secondary | ICD-10-CM | POA: Diagnosis not present

## 2023-02-20 DIAGNOSIS — J984 Other disorders of lung: Secondary | ICD-10-CM | POA: Diagnosis not present

## 2023-02-20 LAB — D-DIMER, QUANTITATIVE: D-Dimer, Quant: 2.69 mcg/mL FEU — ABNORMAL HIGH (ref <0.50)

## 2023-02-20 NOTE — Progress Notes (Signed)
@Patient  ID: Mark [name]., male    DOB: 1960-12-25, 62 y.o.   MRN: [id number]  Chief Complaint  Patient presents with   Follow-up    F/up, sob, worst when lays down.    Referring provider: Agapito Games, *  HPI: 62 year old male, never smoker followed for severe OSA. He is a patient of Dr. Reginia Naas and last seen in office on 10/17/2021 by [facility] NP. Past medical history significant for HTN, AAA, MS, osteoarthritis, BPH, depression, obesity, Brown-Sequard syndrome.  TEST/EVENTS:  06/2012 NPSG: AHI 98/h, corrected by CPAP 11 cmH2O. 358 lb 12/2018 PFT: ratio 81, FVC 52, TLC 44, DLCO 84 03/2021 CT chest: clear lungs 10/2021 NPSG: AHI 34/h, SpO2 low 62%; 2 lpm O2 added 02/04/2022 CPAP titration: optimal PAP pressure 15 cmH2O with residual AHI 2.3/h 04/05/2022 HST: sleeping upright AHI 29/h  12/19/2021: OV with Dr. Vassie Loll. Previously placed on Breo due to chronic cough and recurrent pneumonia. PFTs without airway obstruction; did have some restriction suspected to be r/t weight. Stopped Breo. He had recent sleep study with improvement in OSA but still considered severe 32/hr. Still struggling with CPAP compliance. Discussed various treatment options. Dental appliance suboptimal d/t severity. BMI too high for hypoglossal nerve but plan for referral to ENT while he works on weight loss measures. Offered more CPAP desensitization; he will continue to try at home. He does not notice snoring when he sleeps upright; plan for HST with him sleeping in upright position.   04/15/2022: OV with Dnyla Antonetti NP for follow up to discuss recent sleep study results.  He had a sleep study in April 2023 which showed severe sleep apnea with AHI 34.  He underwent CPAP titration February 04, 2022 due to issues with tolerating CPAP.  Was found to be well controlled on 15 cmH2O with residual AHI 2.3.  He then underwent home sleep study, not on CPAP and while sleeping in his recliner, which still showed severe sleep apnea with  AHI 29.  He has a longstanding history of OSA.  He has a CPAP machine at home but has had trouble tolerating it.  Reports that even when he has used in the past he has not noticed a huge difference in the morning.  The longest stretch of time that he used it was nightly for a week.  He does continue to snore at night and his wife has told him that he stops breathing. He saw ENT in August for evaluation of inspire device.  He was recommended to work on weight loss measures and follow-up in 6 months.  He has lost around 60 pounds.  Currently having trouble with activity due to left knee pain.  He is supposed to be undergoing knee replacement surgery in the next month.  His CPAP machine is quite old.  He was going to take it to be recalibrated to see if he could start using it again.  He denies any extreme daytime fatigue symptoms or drowsy driving. He does worry that his snoring has caused him to have throat irritation. He's struggled with a hoarse voice for a while. He denies any nasal congestion or postnasal drip. He doesn't have any reflux symptoms that he notices. No cough or increased SOB.   06/19/2022: OV with Mae Cianci NP for follow-up.  After last visit, he received a new CPAP machine.  He has been trying to be more consistent with use.  Download showed that he is 63% compliance but is only using it  for about 3-1/2 hours on average.  He does feel like his pressures might be set a little too high.  He wears a nasal mask and feels like the air is pushed out of his mouth. He does feel like it helps him sleep a little bit better. He denies any excessive daytime fatigue, sleep parasomnias/paralysis or drowsy driving. He does still have some hoarse voice and feels like there's phlegm in the back of his throat. No significant GERD symptoms. No cough or SOB. He is still working on weight loss measures. He's limited with his activity due to his chronic left knee pain. He did have surgery to remove the cyst. Plan is to  perform a knee replacement in the next month if cyst does not recur.   10/18/2022: OV with Mariselda Badalamenti NP for follow up and for surgical clearance. He is supposed to be having a left total knee replacement with Dr. Elesa Massed. The date has not been scheduled yet. He had been hospitalized in January 2024 for acute respiratory failure secondary to influenza A. CTA without a s discharged home on room air. He had a persistent cough, which has mostly resolved. Because of this, he had a chest x ray with his PCP on 3/8 which showed mild atelectasis in the lung bases but otherwise, no acute process. He was encouraged to use his incentive spirometer and work on deep breathing exercises.  He tells me today that he is feeling well. He's had a slight increase in postnasal drainage and some voice hoarseness over the past few weeks, which he thinks may be related to allergies. He does feel like his voice catches at times because of this. He started taking claritin and is using flonase. He's still on pepcid for reflux, which is working well. He denies any shortness of breath or increased cough, wheezing, fevers, chills.  He is wearing his CPAP on more of a consistent basis. He feels like he is doing better with it. He's trying to get 4 hours or more a night. If he doesn't wear it or he gets less than 4 hours, it's usually because his sleep is interrupted by pain in his knee. He does not snore with CPAP use. Tends to sleep better with it. Feels like the pressures are better since we changed him to 6-10cmH2O. Denies any excessive daytime fatigue or drowsy driving.  2/95/1884-1/66/0630: CPAP 6-10 cmH2O 28/30 days; 63% >4 hr; average use 4 hours 28 min Pressure median 8.6 Leaks 95th 17.8 AHI 2.9  02/20/2023: Today - follow up Patient presents today for intended follow-up but is also having some acute symptoms.  He had knee arthroplasty 01/08/2023.  He was doing remarkably well after surgery.  He was working with physical therapy and  taking his prescribed cephalexin at discharge.  Did not have any issues with the incision.  He then saw the surgeon 01/24/2023 with no significant complaints.  Incision was healing well.  He did have an aspiration intraoperatively with staphylococcal infection.  He was referred to infectious disease for further treatment.  He was placed on cefadroxil.  Infectious disease felt like this was less likely to be an infection and more likely be colonizing or just contaminant at the time of collection.  However, given his indwelling prosthesis, felt that the best course of action will be to continue his antimicrobial therapy for 3 months total. Today, he tells me that he has been having some increased shortness of breath. He was having trouble before his surgery but  thinks that it's gotten worse since.  Especially struggles at night when he lies down.  He does have an occasional cough, usually dry but will sometimes get white phlegm up. This is relatively unchanged for him. He notices some wheezing now and then. Sinus symptoms are stable. His wife is concerned that he has pneumonia. He has been on antibiotics for potential infected joint. Denies fevers, chills, hemoptysis, calf pain/leg swelling, weight gain, PND. He's trying to use his CPAP as much as possible but doesn't always hit 4 hours or use it every night. He just has trouble tolerating it and doesn't tend to wear it when he's in his recliner. Denies drowsy driving.  Recent CBC with mild anemia; 11.6. WBC normal.   01/21/2023-02/19/2023: CPAP 6-10 cmH2O 26/30 days; 43% >4 hr; average use 4 hr 5 min Pressure 95th 7.8  Leaks 19.4 AHI 0.6   No Known Allergies  Immunization History  Administered Date(s) Administered   Tdap 01/04/2015    Past Medical History:  Diagnosis Date   Anxiety    chronic pain treated with narcotics   Arthritis    Chronic pain    Constipation due to opioid therapy    COPD (chronic obstructive pulmonary disease) (HCC)     Depression    Dysphonia    thoracic left sided"impingement" with heminumbness   Dyspnea    GI bleed    abt 15 years ago   Hypertension    Morbid obesity (HCC)    MS (multiple sclerosis) (HCC) 02/11/2013   ABAGIO  Per. Dr Daphane Shepherd   Neuromuscular disorder [facility]) 2014   Pneumonia    Preoperative respiratory examination 10/18/2022   Sleep apnea    Sleep apnea with use of continuous positive airway pressure (CPAP) 02/11/2013   07-13-12 AHi of 98.6 /hr titrated to 11 cm water ,  average user time 4 hours and 4 minutes. Residual AHI 1.20 December 2012 .   Umbilical hernia     Tobacco History: Social History   Tobacco Use  Smoking Status Never  Smokeless Tobacco Never   Counseling given: Not Answered   Outpatient Medications Prior to Visit  Medication Sig Dispense Refill   amantadine (SYMMETREL) 100 MG capsule Take 100 mg by mouth 3 (three) times daily.     amphetamine-dextroamphetamine (ADDERALL XR) 30 MG 24 hr capsule Take 30 mg by mouth every morning.     aspirin 81 MG chewable tablet Chew by mouth 2 (two) times daily. Take for 30 days post- (L) TKR     benzonatate (TESSALON) 200 MG capsule Take 1 capsule (200 mg total) by mouth 2 (two) times daily as needed for cough. 20 capsule 0   celecoxib (CELEBREX) 200 MG capsule Take 200 mg by mouth 2 (two) times daily.     cephALEXin (KEFLEX) 500 MG capsule Take 500 mg by mouth 4 (four) times daily.     Cholecalciferol (VITAMIN D3) 125 MCG (5000 UT) TABS Take 1 tablet by mouth daily.     Docusate Sodium 100 MG capsule Take by mouth.     DULoxetine (CYMBALTA) 60 MG capsule Take 60 mg by mouth 2 (two) times daily.      famotidine (PEPCID) 20 MG tablet TAKE ONE TABLET BY MOUTH TWICE DAILY 60 tablet 3   fluticasone (FLONASE) 50 MCG/ACT nasal spray Place 1 spray into both nostrils daily. 18.2 mL 2   furosemide (LASIX) 20 MG tablet Take 1 tablet (20 mg total) by mouth daily as needed. 20 tablet 1  gabapentin (NEURONTIN) 800 MG tablet Take 800 mg by  mouth 4 (four) times daily.  11   HYDROcodone-acetaminophen (NORCO) 10-325 MG tablet Take 2 tablets by mouth every 4 (four) hours as needed for severe pain. 40 tablet 0   ibuprofen (ADVIL,MOTRIN) 800 MG tablet Take 800 mg by mouth every 8 (eight) hours as needed for moderate pain.     loratadine (CLARITIN) 10 MG tablet Take by mouth.     losartan-hydrochlorothiazide (HYZAAR) 50-12.5 MG tablet TAKE ONE TABLET BY MOUTH EVERY DAY 90 tablet 1   morphine (MS CONTIN) 60 MG 12 hr tablet Take 60 mg by mouth every 12 (twelve) hours.     tamsulosin (FLOMAX) 0.4 MG CAPS capsule Take 0.4 mg by mouth daily.     Teriflunomide (AUBAGIO) 14 MG TABS Take 1 tablet by mouth at bedtime.     tiZANidine (ZANAFLEX) 4 MG tablet Take 4 mg by mouth every 6 (six) hours as needed for muscle spasms.      predniSONE (DELTASONE) 20 MG tablet Take 1 tablet (20 mg total) by mouth daily with breakfast. 5 tablet 0   No facility-administered medications prior to visit.     Review of Systems:   Constitutional: No weight loss or gain, night sweats, fevers, chills, or lassitude. +mild fatigue HEENT: No headaches, difficulty swallowing, tooth/dental problems, or sore throat. No sneezing, itching, ear ache, nasal congestion, or post nasal drip. +voice hoarseness; postnasal drainage (baseline) CV:  No chest pain, orthopnea, PND, swelling in lower extremities, anasarca, dizziness, palpitations, syncope Resp: +snoring (resolves with CPAP); shortness of breath with exertion; cough; wheezing. No excess mucus or change in color of mucus. No hemoptysis.  No chest wall deformity GI:  No heartburn, indigestion, abdominal pain, nausea, vomiting, diarrhea, change in bowel habits, loss of appetite, bloody stools.  MSK:  +left knee pain (improved).   Neuro: No dizziness or lightheadedness.  Psych: No depression or anxiety. Mood stable.     Physical Exam:  BP 120/80 (BP Location: Left Arm)   Pulse 74   Ht 6\' 1"  (1.854 m)   Wt 294 lb  (133.4 kg)   SpO2 93%   BMI 38.79 kg/m   GEN: Pleasant, interactive, well-appearing; morbidly obese; in no acute distress. HEENT:  Normocephalic and atraumatic. PERRLA. Sclera white. Nasal turbinates pink, moist and patent bilaterally. No rhinorrhea present. Oropharynx pink and moist, without exudate or edema. No lesions, ulcerations NECK:  Supple w/ fair ROM. No JVD present. Normal carotid impulses w/o bruits. Thyroid symmetrical with no goiter or nodules palpated. No lymphadenopathy.   CV: RRR, no m/r/g, no peripheral edema. Pulses intact, +2 bilaterally. No cyanosis, pallor or clubbing. PULMONARY:  Unlabored, regular breathing. End expiratory wheeze bilaterally A&P. No accessory muscle use. No dullness to percussion. GI: BS present and normoactive. Soft, non-tender to palpation. No organomegaly or masses detected.  MSK: No erythema, warmth or tenderness. No deformities or joint swelling noted.  Neuro: A/Ox3. No focal deficits noted.   Skin: Warm, no lesions or rashe Psych: Normal affect and behavior. Judgement and thought content appropriate.     Lab Results:  CBC    Component Value Date/Time   WBC 3.7 (L) 08/21/2022 0455   RBC 4.44 08/21/2022 0455   HGB 12.3 (L) 08/21/2022 0455   HCT 38.2 (L) 08/21/2022 0455   PLT 167 08/21/2022 0455   MCV 86.0 08/21/2022 0455   MCH 27.7 08/21/2022 0455   MCHC 32.2 08/21/2022 0455   RDW 13.0 08/21/2022 0455   LYMPHSABS  0.2 (L) 08/20/2022 1058   MONOABS 0.3 08/20/2022 1058   EOSABS 0.1 08/20/2022 1058   BASOSABS 0.0 08/20/2022 1058    BMET    Component Value Date/Time   NA 134 (L) 08/21/2022 0455   NA 132 (A) 08/08/2022 0000   K 4.3 08/21/2022 0455   CL 96 (L) 08/21/2022 0455   CO2 31 08/21/2022 0455   GLUCOSE 116 (H) 08/21/2022 0455   BUN 20 08/21/2022 0455   BUN 18 08/08/2022 0000   CREATININE 0.76 08/21/2022 0455   CREATININE 0.82 06/26/2021 0000   CALCIUM 8.5 (L) 08/21/2022 0455   GFRNONAA >60 08/21/2022 0455   GFRNONAA 96  12/21/2019 1531   GFRAA 112 12/21/2019 1531    BNP    Component Value Date/Time   BNP 63.0 08/20/2022 1058   BNP 17 06/12/2021 0000     Imaging:  DG Chest 2 View  Result Date: 02/20/2023 CLINICAL DATA:  Shortness of breath. EXAM: CHEST - 2 VIEW COMPARISON:  September 27, 2022. FINDINGS: The heart size and mediastinal contours are within normal limits. Both lungs are clear. Status post surgical posterior fusion of lower thoracic and lumbar spine. IMPRESSION: No active cardiopulmonary disease. Electronically Signed   By: Lupita Raider M.D.   On: 02/20/2023 16:31    Administration History     None           No data to display          No results found for: "NITRICOXIDE"      Assessment & Plan:   Restrictive lung disease Restrictive lung disease felt to be secondary to obesity and MS. He has had dyspnea over the past few months with worsening since his left knee surgery. CXR without superimposed infection. Question reactive airway component with bronchospasm on exam? He has had intermittent symptoms in the past with response to steroids. Will treat him with steroid taper and provide him with SABA for PRN use. If he has improvement with these, could consider controller therapy with ICS/LABA. Other concern would be acute PE given his recent surgery or worsening anemia. Will obtain d dimer and CBC with diff. If d dimer positive, will need CTA chest PE protocol. Walk test today without desaturations on room air. Action plan in place. Close follow up. ED precautions  Patient Instructions  -Albuterol inhaler 2 puffs every 6 hours as needed for shortness of breath or wheezing. Notify if symptoms persist despite rescue inhaler/neb use.  -Prednisone taper.  4 tabs for 2 days, then 3 tabs for 2 days, 2 tabs for 2 days, then 1 tab for 2 days, then stop. Take in AM with food -continue flonase nasal spray each nostril daily -continue claritin daily 1 tab  -increase CPAP usage to nightly,  minimum 4-6 hours   Labs today  Follow up in 2 weeks with Dr. Vassie Loll or Katie Katiejo Gilroy,NP. If symptoms do not improve or worsen, please contact office for sooner follow up or seek emergency care.    Irritable larynx Stable symptoms. Continue intranasal steroid and daily antihistamine for postnasal drainage control and pepcid for GERD.   OSA (obstructive sleep apnea) Severe OSA on CPAP. Suboptimal compliance. Likely contributing to orthopnea. Encouraged to increase usage. Aware of risks of untreated OSA. Cautioned on safe driving practices.   I spent 45 minutes of dedicated to the care of this patient on the date of this encounter to include pre-visit review of records, face-to-face time with the patient discussing conditions above, post visit  ordering of testing, clinical documentation with the electronic health record, making appropriate referrals as documented, and communicating necessary findings to members of the patients care team.  Noemi Chapel, NP 02/20/2023  Pt aware and understands NP's role.

## 2023-02-20 NOTE — Patient Instructions (Signed)
-  Albuterol inhaler 2 puffs every 6 hours as needed for shortness of breath or wheezing. Notify if symptoms persist despite rescue inhaler/neb use.  -Prednisone taper.  4 tabs for 2 days, then 3 tabs for 2 days, 2 tabs for 2 days, then 1 tab for 2 days, then stop. Take in AM with food -continue flonase nasal spray each nostril daily -continue claritin daily 1 tab  -increase CPAP usage to nightly, minimum 4-6 hours   Labs today  Follow up in 2 weeks with Dr. Vassie Loll or Katie Novalyn Lajara,NP. If symptoms do not improve or worsen, please contact office for sooner follow up or seek emergency care.

## 2023-02-20 NOTE — Assessment & Plan Note (Signed)
Severe OSA on CPAP. Suboptimal compliance. Likely contributing to orthopnea. Encouraged to increase usage. Aware of risks of untreated OSA. Cautioned on safe driving practices.

## 2023-02-20 NOTE — Assessment & Plan Note (Addendum)
Restrictive lung disease felt to be secondary to obesity and MS. He has had dyspnea over the past few months with worsening since his left knee surgery. CXR without superimposed infection. Question reactive airway component with bronchospasm on exam? He has had intermittent symptoms in the past with response to steroids. Will treat him with steroid taper and provide him with SABA for PRN use. If he has improvement with these, could consider controller therapy with ICS/LABA. Other concern would be acute PE given his recent surgery or worsening anemia. Will obtain d dimer and CBC with diff. If d dimer positive, will need CTA chest PE protocol. Walk test today without desaturations on room air. Action plan in place. Close follow up. ED precautions  Patient Instructions  -Albuterol inhaler 2 puffs every 6 hours as needed for shortness of breath or wheezing. Notify if symptoms persist despite rescue inhaler/neb use.  -Prednisone taper.  4 tabs for 2 days, then 3 tabs for 2 days, 2 tabs for 2 days, then 1 tab for 2 days, then stop. Take in AM with food -continue flonase nasal spray each nostril daily -continue claritin daily 1 tab  -increase CPAP usage to nightly, minimum 4-6 hours   Labs today  Follow up in 2 weeks with Dr. Vassie Loll or Mark Lilliah Priego,NP. If symptoms do not improve or worsen, please contact office for sooner follow up or seek emergency care.

## 2023-02-20 NOTE — Assessment & Plan Note (Signed)
Stable symptoms. Continue intranasal steroid and daily antihistamine for postnasal drainage control and pepcid for GERD.

## 2023-02-21 ENCOUNTER — Ambulatory Visit (HOSPITAL_BASED_OUTPATIENT_CLINIC_OR_DEPARTMENT_OTHER): Payer: Medicare Other

## 2023-02-21 ENCOUNTER — Telehealth: Payer: Self-pay | Admitting: Acute Care

## 2023-02-21 ENCOUNTER — Telehealth: Payer: Self-pay | Admitting: Nurse Practitioner

## 2023-02-21 ENCOUNTER — Ambulatory Visit (HOSPITAL_COMMUNITY)
Admission: RE | Admit: 2023-02-21 | Discharge: 2023-02-21 | Disposition: A | Discharge status: Home / Self Care | Payer: Medicare Other | Source: Ambulatory Visit | Attending: Nurse Practitioner | Admitting: Nurse Practitioner

## 2023-02-21 DIAGNOSIS — I7121 Aneurysm of the ascending aorta, without rupture: Secondary | ICD-10-CM | POA: Diagnosis not present

## 2023-02-21 DIAGNOSIS — J9811 Atelectasis: Secondary | ICD-10-CM | POA: Diagnosis not present

## 2023-02-21 DIAGNOSIS — R0609 Other forms of dyspnea: Secondary | ICD-10-CM | POA: Diagnosis not present

## 2023-02-21 DIAGNOSIS — R7989 Other specified abnormal findings of blood chemistry: Secondary | ICD-10-CM

## 2023-02-21 DIAGNOSIS — R9389 Abnormal findings on diagnostic imaging of other specified body structures: Secondary | ICD-10-CM | POA: Diagnosis not present

## 2023-02-21 MED ORDER — ALBUTEROL SULFATE HFA 108 (90 BASE) MCG/ACT IN AERS: 2.0000 | INHALATION_SPRAY | Freq: Four times a day (QID) | RESPIRATORY_TRACT | 2 refills | Status: DC | PRN

## 2023-02-21 MED ORDER — IOHEXOL 350 MG/ML SOLN
75.0000 mL | Freq: Once | INTRAVENOUS | Status: AC | PRN
  Administered 2023-02-21: 75 mL via INTRAVENOUS

## 2023-02-21 MED ORDER — PREDNISONE 10 MG PO TABS
ORAL_TABLET | ORAL | 0 refills | Status: DC
Start: 2023-02-21 — End: 2023-04-01

## 2023-02-21 NOTE — Telephone Encounter (Signed)
I received a call from CT regarding Mark [name]. He had  CTA as a result of  elevated d-dimer drawn yesterday at 16:51. CTA was negative for PE.  I told the [facility] he was ok to leave the scanner.  He has Chronic mild elevation of the right hemidiaphragm with associated passive right lung base atelectasis. Dilated main pulmonary artery, suggesting pulmonary arterial hypertension. Ascending thoracic aortic 4.7 cm aneurysm. Ascending thoracic aortic aneurysm. Recommend semi-annual imaging followup by CTA or MRA and referral to cardiothoracic surgery if not already obtained.  He has follow up with Dr. Vassie Loll 04/03/2023. He can make referrals regarding the above noted findings at that time if he feels they are clinically indicated.   I asked the rad tech to tell him he needs to call us if he has any change in his breathing , or seek emergency care. She said she would do so.  Message routed to Dr. Vassie Loll and Rhunette Croft DNP

## 2023-02-21 NOTE — Telephone Encounter (Signed)
Wanted Mark [name] to know that CT is scheduled for today (8/2) at 1:30. She states that they are leaving out of town for an anniversary trip, but they are very concered about the possible PE. Assured Mark [name] that our office would contact with results and a course of action (if needed) as soon as results were read. Routing to [facility] for her to be aware of impending results.

## 2023-02-21 NOTE — Addendum Note (Signed)
Addended by: Noemi Chapel on: 02/21/2023 09:03 AM   Modules accepted: Orders

## 2023-02-21 NOTE — Progress Notes (Signed)
02/21/2023 Addendum: positive d dimer. STAT CTA PE protocol ordered. Pt notified and aware. Verbalized understanding.

## 2023-02-28 ENCOUNTER — Encounter: Payer: Self-pay | Admitting: Neurosurgery

## 2023-03-03 ENCOUNTER — Other Ambulatory Visit: Payer: Self-pay | Admitting: Family Medicine

## 2023-03-03 ENCOUNTER — Encounter: Payer: Self-pay | Admitting: Neurosurgery

## 2023-03-03 DIAGNOSIS — I10 Essential (primary) hypertension: Secondary | ICD-10-CM

## 2023-03-04 ENCOUNTER — Telehealth: Payer: Self-pay | Admitting: Cardiology

## 2023-03-04 ENCOUNTER — Ambulatory Visit
Admission: RE | Admit: 2023-03-04 | Discharge: 2023-03-04 | Disposition: A | Discharge status: Home / Self Care | Payer: Medicare Other | Source: Ambulatory Visit | Attending: Neurosurgery | Admitting: Neurosurgery

## 2023-03-04 DIAGNOSIS — M5440 Lumbago with sciatica, unspecified side: Secondary | ICD-10-CM

## 2023-03-04 DIAGNOSIS — M545 Low back pain, unspecified: Secondary | ICD-10-CM | POA: Diagnosis not present

## 2023-03-04 DIAGNOSIS — M461 Sacroiliitis, not elsewhere classified: Secondary | ICD-10-CM | POA: Diagnosis not present

## 2023-03-04 NOTE — Telephone Encounter (Signed)
Patient wife calling to see about scheduling follow up CTA for January. Per OV on 11/20/22.  Deb, do you know how far out they schedule the CTA

## 2023-03-04 NOTE — Telephone Encounter (Signed)
Mark [name] wife states patient's oxygen levels are in the 78 %. Tracey phone number is [phone].

## 2023-03-04 NOTE — Telephone Encounter (Signed)
Pt spouse called in stating pt usually has a CT of his heart every year and wants to know if Dr. Jens Som will order one again. Please advise.

## 2023-03-05 ENCOUNTER — Ambulatory Visit (HOSPITAL_BASED_OUTPATIENT_CLINIC_OR_DEPARTMENT_OTHER): Payer: Medicare Other | Admitting: Pulmonary Disease

## 2023-03-05 ENCOUNTER — Encounter (HOSPITAL_BASED_OUTPATIENT_CLINIC_OR_DEPARTMENT_OTHER): Payer: Self-pay | Admitting: Pulmonary Disease

## 2023-03-05 VITALS — BP 138/72 | HR 87 | Ht 73.0 in | Wt 292.0 lb

## 2023-03-05 DIAGNOSIS — J9611 Chronic respiratory failure with hypoxia: Secondary | ICD-10-CM | POA: Diagnosis not present

## 2023-03-05 DIAGNOSIS — G4733 Obstructive sleep apnea (adult) (pediatric): Secondary | ICD-10-CM | POA: Diagnosis not present

## 2023-03-05 NOTE — Assessment & Plan Note (Signed)
On ambulation, oxygen saturation dropped to 87%, on 1 L of oxygen it stayed at 91% on ambulation. Arrival send in prescription for him to use 2 L of oxygen during ambulation and 1 L during rest and sleep blended into CPAP -Cause is unclear, may be related to fluid overload or right diaphragmatic weakness, possible that his multiple sclerosis is getting worse.  He is awaiting neurological reassessment.  We will investigate with sniff test

## 2023-03-05 NOTE — Progress Notes (Signed)
DME order DME up E[facility]s will yes  Subjective:    Patient ID: Mark [name]., male    DOB: 1960-11-12, 62 y.o.   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 She states his O2 levels seem to be dropping low, 80s-70s, and will often take his CPAP off while asleep. She states he will doze off during the day and she will check his O2 and it is often in the low 80s. She is concerned that the recent anesthesia may have some lingering affects   He was last seen by APP on 8/1.  He underwent knee arthroplasty 01/08/2023, complicated by staph infection and is on cephalexin with plan for 3 months.  He was ambulated and did not desaturate on walking D-dimer was high and emergent CT angiogram chest did not show any evidence of pulm embolism  He arrives to the office today with saturation of 91%.  He reports that when he has to live supine during physical therapy he feels like he is smothering  Significant tests/ events reviewed   CTA chest 02/21/23 >> Chronic mild elevation of the right hemidiaphragm with associated passive right lung base atelectasis. Dilated main pulmonary artery, suggesting pulmonary arterial hypertension. Ascending thoracic aortic 4.7 cm aneurysm.  CT chest from 03/2021 shows clear lungs, Thoracic aortic aneurysm 4.4 cm   PFTs 12/2018 show ratio 81 with FVC 52%, TLC 44% and DLCO 84%   PFTs 03/2018 show ratio 78 with FVC 78%, TLC 65% with DLCO 85%   NPSG 10/2021 severe, AHI 34/h, lowest desat 62% , 2L O2 added N PSG 06/2012 -358 pounds -AHI 98/hour, corrected by CPAP 11 cm  Review of  Systems neg for any significant sore throat, dysphagia, itching, sneezing, nasal congestion or excess/ purulent secretions, fever, chills, sweats, unintended wt loss, pleuritic or exertional cp, hempoptysis, orthopnea pnd or change in chronic leg swelling. Also denies presyncope, palpitations, heartburn, abdominal pain, nausea, vomiting, diarrhea or change in bowel or urinary habits, dysuria,hematuria, rash, arthralgias, visual complaints, headache, numbness weakness or ataxia.     Objective:   Physical Exam  Gen. Pleasant, obese, in no distress ENT - no lesions, no post nasal drip Neck: No JVD, no thyromegaly, no carotid bruits Lungs: no use of accessory muscles, no dullness to percussion, decreased without rales or rhonchi  Cardiovascular: Rhythm regular, heart sounds  normal, no murmurs or gallops, 1+ peripheral edema Musculoskeletal: No deformities, no cyanosis or clubbing , no tremors       Assessment & Plan:

## 2023-03-05 NOTE — Assessment & Plan Note (Signed)
CPAP download was reviewed which shows good control of events on auto settings 6 to 10 cm with average pressure of 8.1 cm.  Compliance is on average 4 hours with many nights less than 4 hours. Weight loss encouraged, compliance with goal of at least 4-6 hrs every night is the expectation. Advised against medications with sedative side effects Cautioned against driving when sleepy - understanding that sleepiness will vary on a day to day basis  We will schedule CPAP titration study to see if you need a different type of machine called BiPAP and make sure that oxygen level is corrected adequately

## 2023-03-05 NOTE — Patient Instructions (Signed)
Your oxygen level is low today. x we will set you up with 2 L of oxygen to use 24/7.  At night oxygen can be blended into her CPAP machine.  - take Lasix daily 20 mg until your weight drops down by 5 pounds to 285 pounds   We will schedule CPAP titration study to see if you need a different type of machine called BiPAP and make sure that oxygen level is corrected adequately

## 2023-03-05 NOTE — Telephone Encounter (Signed)
Spoke with patient's wife, Kennith Center ([place]), she report since knee surgery in June he has developed increasing cognitive decline and he is sleeping more. She states his O2 levels seem to be dropping low, 80s-70s, and will often take his CPAP off while asleep. She states he will doze off during the day and she will check his O2 and it is often in the low 80s. She is concerned that the recent anesthesia may have some lingering affects.   Patient does not currently have any supplemental O2.  Please advise, thank you!

## 2023-03-06 DIAGNOSIS — J9611 Chronic respiratory failure with hypoxia: Secondary | ICD-10-CM | POA: Diagnosis not present

## 2023-03-06 NOTE — Telephone Encounter (Signed)
Spoke with pt wife, aware the patient had a scan in August for elevated d-dimer. Will show that scan to dr Jens Som and then let them know when the next scan will be due.

## 2023-03-10 ENCOUNTER — Other Ambulatory Visit: Payer: Medicare Other

## 2023-03-11 DIAGNOSIS — G8929 Other chronic pain: Secondary | ICD-10-CM | POA: Diagnosis not present

## 2023-03-11 DIAGNOSIS — M1732 Unilateral post-traumatic osteoarthritis, left knee: Secondary | ICD-10-CM | POA: Diagnosis not present

## 2023-03-11 DIAGNOSIS — M1712 Unilateral primary osteoarthritis, left knee: Secondary | ICD-10-CM | POA: Diagnosis not present

## 2023-03-11 DIAGNOSIS — M25562 Pain in left knee: Secondary | ICD-10-CM | POA: Diagnosis not present

## 2023-03-11 NOTE — Telephone Encounter (Signed)
Spoke with pt wife, aware dr Jens Som has reviewed recent scan and he will not need another CTA until 08/2023.

## 2023-03-13 DIAGNOSIS — M25562 Pain in left knee: Secondary | ICD-10-CM | POA: Diagnosis not present

## 2023-03-13 DIAGNOSIS — M1712 Unilateral primary osteoarthritis, left knee: Secondary | ICD-10-CM | POA: Diagnosis not present

## 2023-03-13 DIAGNOSIS — M1732 Unilateral post-traumatic osteoarthritis, left knee: Secondary | ICD-10-CM | POA: Diagnosis not present

## 2023-03-13 DIAGNOSIS — G8929 Other chronic pain: Secondary | ICD-10-CM | POA: Diagnosis not present

## 2023-03-17 DIAGNOSIS — M25562 Pain in left knee: Secondary | ICD-10-CM | POA: Diagnosis not present

## 2023-03-17 DIAGNOSIS — G8929 Other chronic pain: Secondary | ICD-10-CM | POA: Diagnosis not present

## 2023-03-17 DIAGNOSIS — M1732 Unilateral post-traumatic osteoarthritis, left knee: Secondary | ICD-10-CM | POA: Diagnosis not present

## 2023-03-17 DIAGNOSIS — M1712 Unilateral primary osteoarthritis, left knee: Secondary | ICD-10-CM | POA: Diagnosis not present

## 2023-03-20 DIAGNOSIS — G8929 Other chronic pain: Secondary | ICD-10-CM | POA: Diagnosis not present

## 2023-03-20 DIAGNOSIS — M1712 Unilateral primary osteoarthritis, left knee: Secondary | ICD-10-CM | POA: Diagnosis not present

## 2023-03-20 DIAGNOSIS — M25562 Pain in left knee: Secondary | ICD-10-CM | POA: Diagnosis not present

## 2023-03-20 DIAGNOSIS — M1732 Unilateral post-traumatic osteoarthritis, left knee: Secondary | ICD-10-CM | POA: Diagnosis not present

## 2023-03-26 DIAGNOSIS — B957 Other staphylococcus as the cause of diseases classified elsewhere: Secondary | ICD-10-CM | POA: Diagnosis not present

## 2023-03-26 DIAGNOSIS — Z96652 Presence of left artificial knee joint: Secondary | ICD-10-CM | POA: Diagnosis not present

## 2023-03-26 DIAGNOSIS — T8454XD Infection and inflammatory reaction due to internal left knee prosthesis, subsequent encounter: Secondary | ICD-10-CM | POA: Diagnosis not present

## 2023-03-26 DIAGNOSIS — G4733 Obstructive sleep apnea (adult) (pediatric): Secondary | ICD-10-CM | POA: Diagnosis not present

## 2023-03-26 DIAGNOSIS — G35 Multiple sclerosis: Secondary | ICD-10-CM | POA: Diagnosis not present

## 2023-03-27 DIAGNOSIS — Z125 Encounter for screening for malignant neoplasm of prostate: Secondary | ICD-10-CM | POA: Diagnosis not present

## 2023-03-28 DIAGNOSIS — G8381 Brown-Sequard syndrome: Secondary | ICD-10-CM | POA: Diagnosis not present

## 2023-03-28 DIAGNOSIS — G35 Multiple sclerosis: Secondary | ICD-10-CM | POA: Diagnosis not present

## 2023-03-31 DIAGNOSIS — M25562 Pain in left knee: Secondary | ICD-10-CM | POA: Diagnosis not present

## 2023-03-31 DIAGNOSIS — G8929 Other chronic pain: Secondary | ICD-10-CM | POA: Diagnosis not present

## 2023-03-31 DIAGNOSIS — M1732 Unilateral post-traumatic osteoarthritis, left knee: Secondary | ICD-10-CM | POA: Diagnosis not present

## 2023-03-31 DIAGNOSIS — M1712 Unilateral primary osteoarthritis, left knee: Secondary | ICD-10-CM | POA: Diagnosis not present

## 2023-04-01 ENCOUNTER — Encounter (HOSPITAL_BASED_OUTPATIENT_CLINIC_OR_DEPARTMENT_OTHER): Payer: Self-pay | Admitting: Pulmonary Disease

## 2023-04-01 ENCOUNTER — Ambulatory Visit (INDEPENDENT_AMBULATORY_CARE_PROVIDER_SITE_OTHER): Payer: Medicare Other | Admitting: Pulmonary Disease

## 2023-04-01 VITALS — BP 102/74 | HR 88 | Resp 18 | Ht 73.0 in | Wt 286.6 lb

## 2023-04-01 DIAGNOSIS — J9611 Chronic respiratory failure with hypoxia: Secondary | ICD-10-CM | POA: Diagnosis not present

## 2023-04-01 DIAGNOSIS — G4733 Obstructive sleep apnea (adult) (pediatric): Secondary | ICD-10-CM | POA: Diagnosis not present

## 2023-04-01 NOTE — Patient Instructions (Signed)
X Amb sat to advise about daytime oxygen use  Continue using oxygen into your CPAP during sleep

## 2023-04-01 NOTE — Progress Notes (Unsigned)
   Subjective:    Patient ID: Mark Dimitri., male    DOB: 1961/02/12, 62 y.o.   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  We also gave him Lasix, he diuresed from 292 to 286 pounds.  Today he states that he feels much improved.  His wife Mark [name] is on the phone and she corroborates.  His mind is clear he is not as sleepy.  He is breathing much better He has been more compliant with CPAP  He had CT lumbar spine which showed multiple screws broken.  He he had a visit with his neurology for multiple sclerosis.  On ambulation today his oxygen saturation stays at 92%   Significant tests/ events reviewed   CTA chest 02/21/23 >> Chronic mild elevation of the right hemidiaphragm with associated passive right lung base atelectasis. Dilated main pulmonary artery, suggesting pulmonary arterial hypertension. Ascending thoracic aortic 4.7 cm aneurysm.   CT chest from 03/2021 shows clear lungs, Thoracic aortic aneurysm 4.4 cm   PFTs 12/2018 show ratio 81 with FVC 52%, TLC 44% and DLCO 84%   PFTs 03/2018 show ratio 78 with FVC 78%, TLC 65% with DLCO 85%   NPSG 10/2021 severe, AHI 34/h, lowest desat 62% , 2L O2 added N PSG 06/2012 -358 pounds -AHI 98/hour, corrected by CPAP 11 cm Review of Systems neg for any significant sore throat, dysphagia, itching, sneezing, nasal congestion or excess/ purulent secretions, fever, chills, sweats, unintended wt loss, pleuritic or exertional cp, hempoptysis, orthopnea pnd or change in chronic leg swelling.  Also denies presyncope, palpitations, heartburn, abdominal pain, nausea, vomiting, diarrhea or change in bowel or urinary habits, dysuria,hematuria, rash, arthralgias, visual complaints, headache, numbness weakness or ataxia.     Objective:   Physical Exam  Gen. Pleasant, obese, in no distress ENT - no lesions, no post nasal drip Neck: No JVD, no thyromegaly, no carotid bruits Lungs: no use of accessory muscles, no dullness to percussion, decreased without rales or rhonchi  Cardiovascular: Rhythm regular, heart sounds  normal, no murmurs or gallops, no peripheral edema Musculoskeletal: No deformities, no cyanosis or clubbing , no tremors       Assessment & Plan:

## 2023-04-02 ENCOUNTER — Other Ambulatory Visit: Payer: Self-pay | Admitting: Family Medicine

## 2023-04-02 DIAGNOSIS — R600 Localized edema: Secondary | ICD-10-CM

## 2023-04-02 DIAGNOSIS — R351 Nocturia: Secondary | ICD-10-CM | POA: Diagnosis not present

## 2023-04-02 DIAGNOSIS — N401 Enlarged prostate with lower urinary tract symptoms: Secondary | ICD-10-CM | POA: Diagnosis not present

## 2023-04-02 DIAGNOSIS — N5201 Erectile dysfunction due to arterial insufficiency: Secondary | ICD-10-CM | POA: Diagnosis not present

## 2023-04-02 NOTE — Assessment & Plan Note (Signed)
Much improved, unclear if this was due to diuresis about 6 pounds versus poor CPAP usage in the setting of diaphragm paralysis. At any rate this seems to be improved.  He does not desaturate on ambulation today but we will allow him to keep his portable oxygen.  He will continue to use oxygen blended into CPAP during sleep

## 2023-04-02 NOTE — Assessment & Plan Note (Signed)
He is much more compliant and usage is gone up more than 6 hours every night over the past few weeks.  It seems to require pressure of 8 cm.  CPAP supplies will be renewed Weight loss encouraged, compliance with goal of at least 4-6 hrs every night is the expectation. Advised against medications with sedative side effects Cautioned against driving when sleepy - understanding that sleepiness will vary on a day to day basis

## 2023-04-03 ENCOUNTER — Ambulatory Visit: Payer: Medicare Other | Admitting: Family Medicine

## 2023-04-06 DIAGNOSIS — J9611 Chronic respiratory failure with hypoxia: Secondary | ICD-10-CM | POA: Diagnosis not present

## 2023-04-09 ENCOUNTER — Other Ambulatory Visit: Payer: Self-pay | Admitting: Nurse Practitioner

## 2023-04-09 DIAGNOSIS — J984 Other disorders of lung: Secondary | ICD-10-CM

## 2023-04-10 ENCOUNTER — Ambulatory Visit (HOSPITAL_COMMUNITY)
Admission: RE | Admit: 2023-04-10 | Discharge: 2023-04-10 | Disposition: A | Discharge status: Home / Self Care | Payer: Medicare Other | Source: Ambulatory Visit | Attending: Pulmonary Disease | Admitting: Pulmonary Disease

## 2023-04-10 DIAGNOSIS — J986 Disorders of diaphragm: Secondary | ICD-10-CM | POA: Diagnosis not present

## 2023-04-10 DIAGNOSIS — J9611 Chronic respiratory failure with hypoxia: Secondary | ICD-10-CM | POA: Insufficient documentation

## 2023-04-10 DIAGNOSIS — R0902 Hypoxemia: Secondary | ICD-10-CM | POA: Diagnosis not present

## 2023-04-13 ENCOUNTER — Encounter (HOSPITAL_BASED_OUTPATIENT_CLINIC_OR_DEPARTMENT_OTHER): Payer: Self-pay | Admitting: Pulmonary Disease

## 2023-04-14 ENCOUNTER — Telehealth: Payer: Self-pay | Admitting: Pulmonary Disease

## 2023-04-14 NOTE — Telephone Encounter (Signed)
PT is getting pains in his neck. His wife checked with "Dr. Waverly Ferrari" and it seems to be that the issues with his diaphragm (some nerve) is causing that pain.   He is having neuro surgery in the near future and wants to know if he should wait for the surgery and see if that clears it up or address it now. Why he calling his pulm Dr. I am not sure.   Please call PT to advise @ [phone]

## 2023-04-16 ENCOUNTER — Encounter: Payer: Self-pay | Admitting: Pulmonary Disease

## 2023-04-16 ENCOUNTER — Ambulatory Visit: Payer: Medicare Other | Admitting: Pulmonary Disease

## 2023-04-16 VITALS — BP 130/74 | HR 84 | Temp 97.5°F | Ht 72.0 in | Wt 298.6 lb

## 2023-04-16 DIAGNOSIS — R0602 Shortness of breath: Secondary | ICD-10-CM

## 2023-04-16 DIAGNOSIS — J986 Disorders of diaphragm: Secondary | ICD-10-CM

## 2023-04-16 DIAGNOSIS — M542 Cervicalgia: Secondary | ICD-10-CM

## 2023-04-16 NOTE — Patient Instructions (Signed)
Schedule for pulmonary function test with assessment of muscle pressures  Obtain arterial blood gas to ascertain CO2 retention  Pain management, use of muscle relaxants  Scheduled to see Dr. Vassie Loll as soon as able for another opinion regarding concerns

## 2023-04-16 NOTE — Progress Notes (Signed)
Mark [name]    [id number]    1961/04/01  Primary Care Physician:Metheney, Barbarann Ehlers, MD  Referring Physician: Agapito Games, MD [address] [place],  [place] [address]  Chief complaint:   Patient being seen for neck pain, shortness of breath  HPI:  Patient of Dr.Alvas being seen for neck pain. Known to have a paralyzed of the diaphragm-right hemidiaphragm  Has been noted to have medical movement usually during sleep and believes this is causing the neck pain, the shortness of breath during sleep he relates to having a paralyzed hemidiaphragm  He does have shortness of breath when laying flat, so sleeps with the head of bed elevated  He is on CPAP therapy which he uses regularly  Has a history of chronic respiratory failure He has a history of MS History of cord compression for which she has had 2 surgeries previously Cord compression is what is believed to be causing his paralyzed hemidiaphragm  Chronic back pain for which he is on opiates  Never smoker, history of obstructive sleep apnea, recurrent pneumonia  Was started on oxygen supplementation for exertional desaturations  Recently had a sniff test confirming his paralyzed hemidiaphragm  Most recent PFT was June 2020 with total lung capacity of 44% and a DLCO of 84%  Previous sleep study April 2023 reveals severe obstructive sleep apnea with severe oxygen desaturations, he is on a CPAP of 11  His spouse did show me a video of him sleeping with his head movement with his breathing breathing  He is on opiates for analgesia, muscle relaxant, Cymbalta for neuropathic pain, gabapentin  Outpatient Encounter Medications as of 04/16/2023  Medication Sig   albuterol (VENTOLIN HFA) 108 (90 Base) MCG/ACT inhaler Inhale 2 puffs into the lungs every 6 (six) hours as needed for wheezing or shortness of breath.   amantadine (SYMMETREL) 100 MG capsule Take 100 mg by mouth 3 (three)  times daily.   amphetamine-dextroamphetamine (ADDERALL XR) 30 MG 24 hr capsule Take 30 mg by mouth every morning.   celecoxib (CELEBREX) 200 MG capsule Take 200 mg by mouth 2 (two) times daily.   cephALEXin (KEFLEX) 500 MG capsule Take 500 mg by mouth 4 (four) times daily.   DULoxetine (CYMBALTA) 60 MG capsule Take 60 mg by mouth 2 (two) times daily.    famotidine (PEPCID) 20 MG tablet TAKE ONE TABLET BY MOUTH TWICE DAILY   fluticasone (FLONASE) 50 MCG/ACT nasal spray Place 1 spray into both nostrils daily. (Patient taking differently: Place 1 spray into both nostrils daily as needed for allergies or rhinitis.)   furosemide (LASIX) 20 MG tablet Take 1 tablet (20 mg total) by mouth daily as needed.   gabapentin (NEURONTIN) 800 MG tablet Take 800 mg by mouth 4 (four) times daily.   HYDROcodone-acetaminophen (NORCO) 10-325 MG tablet Take 2 tablets by mouth every 4 (four) hours as needed for severe pain.   ibuprofen (ADVIL,MOTRIN) 800 MG tablet Take 800 mg by mouth every 8 (eight) hours as needed for moderate pain.   loratadine (CLARITIN) 10 MG tablet Take by mouth.   losartan-hydrochlorothiazide (HYZAAR) 50-12.5 MG tablet TAKE ONE TABLET BY MOUTH EVERY DAY   morphine (MS CONTIN) 60 MG 12 hr tablet Take 60 mg by mouth every 12 (twelve) hours.   tamsulosin (FLOMAX) 0.4 MG CAPS capsule Take 0.4 mg by mouth daily.   Teriflunomide (AUBAGIO) 14 MG TABS Take 1 tablet by mouth at  bedtime.   tiZANidine (ZANAFLEX) 4 MG tablet Take 4 mg by mouth every 6 (six) hours as needed for muscle spasms.    [DISCONTINUED] Cholecalciferol (VITAMIN D3) 125 MCG (5000 UT) TABS Take 1 tablet by mouth daily. (Patient not taking: Reported on 04/16/2023)   [DISCONTINUED] Docusate Sodium 100 MG capsule Take by mouth. (Patient not taking: Reported on 04/16/2023)   No facility-administered encounter medications on file as of 04/16/2023.    Allergies as of 04/16/2023   (No Known Allergies)    Past Medical History:  Diagnosis  Date   Anxiety    chronic pain treated with narcotics   Arthritis    Chronic pain    Constipation due to opioid therapy    COPD (chronic obstructive pulmonary disease) (HCC)    Depression    Dysphonia    thoracic left sided"impingement" with heminumbness   Dyspnea    GI bleed    abt 15 years ago   Hypertension    Morbid obesity (HCC)    MS (multiple sclerosis) (HCC) 02/11/2013   ABAGIO  Per. Dr Daphane Shepherd   Neuromuscular disorder [facility]) 2014   Pneumonia    Preoperative respiratory examination 10/18/2022   Sleep apnea    Sleep apnea with use of continuous positive airway pressure (CPAP) 02/11/2013   07-13-12 AHi of 98.6 /hr titrated to 11 cm water ,  average user time 4 hours and 4 minutes. Residual AHI 1.20 December 2012 .   Umbilical hernia     Past Surgical History:  Procedure Laterality Date   ANTERIOR CERVICAL DECOMP/DISCECTOMY FUSION N/A 09/29/2019   Procedure: Anterior Cervical Decompression Fusion - Cervical Four-Cervical Five;  Surgeon: Donalee Citrin, MD;  Location: [facility] OR;  Service: Neurosurgery;  Laterality: N/A;  Anterior Cervical Decompression Fusion - Cervical Four-Cervical Five    CARPAL TUNNEL RELEASE Right    Dr. Aundra Millet TUNNEL RELEASE Right 09/29/2019   Procedure: Carpal Tunnel Release;  Surgeon: Donalee Citrin, MD;  Location: [facility] OR;  Service: Neurosurgery;  Laterality: Right;   COLONOSCOPY W/ POLYPECTOMY     ESOPHAGOGASTRODUODENOSCOPY     MULTIPLE TOOTH EXTRACTIONS     POSTERIOR LUMBAR FUSION  04/17/2015   Dr. 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 They have two children. He enjoys playing video games.  Social Determinants of Health   Financial Resource Strain: Low Risk  (01/09/2023)   Received from [facility]   Overall Financial Resource Strain (CARDIA)    Difficulty of Paying Living Expenses: Not hard at all  Food Insecurity: No Food Insecurity (01/10/2023)   Hunger Vital Sign    Worried About Running Out of Food in the Last Year: Never true    Ran Out of Food in the Last Year: Never true  Transportation Needs: No Transportation Needs (01/10/2023)   PRAPARE - Administrator, Civil Service (Medical): No    Lack of Transportation (Non-Medical): No  Physical Activity: Inactive (08/23/2022)   Exercise Vital Sign    Days of Exercise per Week: 0 days    Minutes of Exercise per Session: 0 min  Stress: No Stress Concern Present (01/09/2023)   Received from [facility], [facility] of Occupational Health - Occupational Stress Questionnaire    Feeling of Stress : Not at all  Social Connections: Unknown (03/06/2023)   Received from [facility]   Social Network    Social Network: Not on file  Intimate Partner Violence: Not At Risk (01/09/2023)   Received from [facility], [facility]   HITS    Over the last 12 months how often did your partner physically hurt you?: 1    Over the last 12 months how often did your partner insult you or talk down to you?: 1     Over the last 12 months how often did your partner threaten you with physical harm?: 1    Over the last 12 months how often did your partner scream or curse at you?: 1    Review of Systems  Constitutional:  Positive for fatigue.  Respiratory:  Positive for apnea and shortness of breath.   Psychiatric/Behavioral:  Positive for sleep disturbance.     Vitals:   04/16/23 1001  BP: 130/74  Pulse: 84  Temp: (!) 97.5 F (36.4 C)  SpO2: 93%     Physical Exam Constitutional:      Appearance: He is obese.  HENT:     Head: Normocephalic.     Mouth/Throat:     Mouth: Mucous membranes are moist.  Eyes:     Pupils: Pupils are equal, round, and reactive to light.  Cardiovascular:     Rate and Rhythm: Normal rate and regular rhythm.     Heart sounds: No murmur heard.    No friction rub.  Pulmonary:     Effort: No respiratory distress.     Breath sounds: No stridor. No wheezing or rhonchi.     Comments: Decreased air movement at the right base Musculoskeletal:     Cervical back: No rigidity or tenderness.  Neurological:     Mental Status: He is alert.  Psychiatric:        Mood and Affect: Mood normal.      Data Reviewed: Recent sleep study reviewed  Recent sleep study result reviewed  Dr. Reginia Naas notes reviewed  Assessment:  His neck pain is multifactorial -I did discuss with him that it is more likely related to his cord compression, herniated disc in his cervical spine, possibly from his multiple sclerosis rather than from a paralyzed hemidiaphragm contributing to any kind of pain or discomfort  Obstructive sleep apnea -Encouraged to continue CPAP on a nightly basis  Restrictive lung disease  Class III obesity  Chronic back pain  Plan/Recommendations:  Schedule patient for  reassessment of his pulmonary function test and assessment of inspiratory and expiratory muscle pressures  Obtain an arterial blood gas to assess for CO2 retention  Continue pain  management  We will schedule him to follow-up with Dr. Vassie Loll  about his opinion about his paralyzed hemidiaphragm causing the neck pain  Options of treatment for paralyzed diaphragm discussed with the patient including diaphragmatic pacing or diaphragmatic plication  Encouraged continuing weight loss measures  Graded activities as tolerated   Virl Diamond MD [facility] 04/16/2023, 10:06 AM  CC: Agapito Games, *

## 2023-04-18 NOTE — Telephone Encounter (Signed)
I read PT notes from Dr. Vassie Loll. He expressed understanding. NFN.

## 2023-04-21 ENCOUNTER — Other Ambulatory Visit: Payer: Self-pay | Admitting: Family Medicine

## 2023-04-21 DIAGNOSIS — R051 Acute cough: Secondary | ICD-10-CM | POA: Diagnosis not present

## 2023-04-21 DIAGNOSIS — R600 Localized edema: Secondary | ICD-10-CM

## 2023-04-23 ENCOUNTER — Encounter: Payer: Self-pay | Admitting: Pulmonary Disease

## 2023-04-24 ENCOUNTER — Ambulatory Visit: Payer: Medicare Other | Admitting: Family Medicine

## 2023-04-24 ENCOUNTER — Encounter: Payer: Self-pay | Admitting: Family Medicine

## 2023-04-24 VITALS — BP 139/80 | HR 87 | Ht 72.0 in | Wt 300.0 lb

## 2023-04-24 DIAGNOSIS — I10 Essential (primary) hypertension: Secondary | ICD-10-CM

## 2023-04-24 DIAGNOSIS — R7301 Impaired fasting glucose: Secondary | ICD-10-CM | POA: Diagnosis not present

## 2023-04-24 DIAGNOSIS — T380X5A Adverse effect of glucocorticoids and synthetic analogues, initial encounter: Secondary | ICD-10-CM | POA: Diagnosis not present

## 2023-04-24 LAB — POCT GLYCOSYLATED HEMOGLOBIN (HGB A1C): Hemoglobin A1C: 6.3 % — AB (ref 4.0–5.6)

## 2023-04-24 NOTE — Assessment & Plan Note (Signed)
Initial blood pressure elevated today we will repeat before he leaves today.

## 2023-04-24 NOTE — Assessment & Plan Note (Signed)
BMI 40.  Weight is up a couple pounds since I last saw him just encouraged him to continue to work on healthy diet and regular exercise. Also helps with glucose regulation.

## 2023-04-24 NOTE — Assessment & Plan Note (Signed)
Lab Results  Component Value Date   HGBA1C 6.3 (A) 04/24/2023   A1c was 6.3 today which is up significantly from 5.8.  Continue to work on healthy food choices and regular exercise we stressed the importance of this or at some point will end up on medication for diabetes.  Really work on cutting back on starches and carbs as well.  Plan to recheck again in 3 months.

## 2023-04-24 NOTE — Progress Notes (Signed)
Established Patient Office Visit  Subjective   Patient ID: Gweneth Dimitri., male    DOB: [birth year]  Age: 62 y.o. MRN: [id number]  Chief Complaint  Patient presents with   Medical Management of Chronic Issues    6 mo fup  on htn  and prediabeties    HPI  Hypertension- Pt denies chest pain, SOB, dizziness, or heart palpitations.  Taking meds as directed w/o problems.  Denies medication side effects.    Impaired fasting glucose-no increased thirst or urination. No symptoms consistent with hypoglycemia.  He does not like or eat a lot of vegetables.  Recently saw Dr. Wynetta Emery his neurosurgeon and unfortunately he has some broken screws and hardware in his spine they are trying to get some additional imaging to decide what they are going to do.  He says that he has been getting the sensation of feeling like he cannot breathe when he lays back flat in bed so he is having to sit a little bit more upright.  He did go to urgent care earlier this week for cough and shortness of breath and was placed on some steroids he says the cough responded pretty quickly and he started to improve.    ROS    Objective:     BP 139/80   Pulse 87   Ht 6' (1.829 m)   Wt 300 lb (136.1 kg)   BMI 40.69 kg/m    Physical Exam Vitals and nursing note reviewed.  Constitutional:      Appearance: Normal appearance.  HENT:     Head: Normocephalic and atraumatic.  Eyes:     Conjunctiva/sclera: Conjunctivae normal.  Cardiovascular:     Rate and Rhythm: Normal rate and regular rhythm.  Pulmonary:     Effort: Pulmonary effort is normal.     Breath sounds: Normal breath sounds.  Skin:    General: Skin is warm and dry.  Neurological:     Mental Status: He is alert.  Psychiatric:        Mood and Affect: Mood normal.      Results for orders placed or performed in visit on 04/24/23  POCT HgB A1C  Result Value Ref Range   Hemoglobin A1C 6.3 (A) 4.0 - 5.6 %   HbA1c POC (<> result, manual entry)      HbA1c, POC (prediabetic range)     HbA1c, POC (controlled diabetic range)        The ASCVD Risk score (Arnett DK, et al., 2019) failed to calculate for the following reasons:   The valid total cholesterol range is 130 to 320 mg/dL    Assessment & Plan:   Problem List Items Addressed This Visit       Cardiovascular and Mediastinum   Essential hypertension    Initial blood pressure elevated today we will repeat before he leaves today.        Endocrine   IFG (impaired fasting glucose) - Primary    Lab Results  Component Value Date   HGBA1C 6.3 (A) 04/24/2023   A1c was 6.3 today which is up significantly from 5.8.  Continue to work on healthy food choices and regular exercise we stressed the importance of this or at some point will end up on medication for diabetes.  Really work on cutting back on starches and carbs as well.  Plan to recheck again in 3 months.      Relevant Orders   POCT HgB A1C (Completed)     Other  Morbid obesity (HCC)    BMI 40.  Weight is up a couple pounds since I last saw him just encouraged him to continue to work on healthy diet and regular exercise. Also helps with glucose regulation.        Relevant Orders   DG Bone Density   Other Visit Diagnoses     Adverse effect of corticosteroids, initial encounter          Recommend DEXA since hx of recurrent steroid.     Return in about 14 weeks (around 07/31/2023) for Pre-diabetes.    Nani Gasser, MD

## 2023-04-24 NOTE — Telephone Encounter (Signed)
I have called [facility] on 04/16/23 and left message they should be calling to set this up if you would like to call them also they number is [phone]

## 2023-04-29 ENCOUNTER — Other Ambulatory Visit (HOSPITAL_COMMUNITY): Payer: Self-pay | Admitting: Radiology

## 2023-04-29 DIAGNOSIS — J986 Disorders of diaphragm: Secondary | ICD-10-CM

## 2023-04-29 DIAGNOSIS — R0602 Shortness of breath: Secondary | ICD-10-CM

## 2023-05-02 ENCOUNTER — Other Ambulatory Visit: Payer: Self-pay | Admitting: Neurosurgery

## 2023-05-02 DIAGNOSIS — G959 Disease of spinal cord, unspecified: Secondary | ICD-10-CM

## 2023-05-06 DIAGNOSIS — J9611 Chronic respiratory failure with hypoxia: Secondary | ICD-10-CM | POA: Diagnosis not present

## 2023-05-07 ENCOUNTER — Ambulatory Visit: Payer: Medicare Other

## 2023-05-07 ENCOUNTER — Ambulatory Visit (HOSPITAL_COMMUNITY)
Admission: RE | Admit: 2023-05-07 | Discharge: 2023-05-07 | Disposition: A | Discharge status: Home / Self Care | Payer: Medicare Other | Source: Ambulatory Visit | Attending: Pulmonary Disease

## 2023-05-07 ENCOUNTER — Ambulatory Visit (HOSPITAL_COMMUNITY)
Admission: RE | Admit: 2023-05-07 | Discharge: 2023-05-07 | Disposition: A | Discharge status: Home / Self Care | Payer: Medicare Other | Source: Ambulatory Visit | Attending: Pulmonary Disease | Admitting: Pulmonary Disease

## 2023-05-07 DIAGNOSIS — J986 Disorders of diaphragm: Secondary | ICD-10-CM | POA: Insufficient documentation

## 2023-05-07 DIAGNOSIS — R0602 Shortness of breath: Secondary | ICD-10-CM

## 2023-05-07 DIAGNOSIS — Z0189 Encounter for other specified special examinations: Secondary | ICD-10-CM | POA: Diagnosis not present

## 2023-05-07 DIAGNOSIS — M8580 Other specified disorders of bone density and structure, unspecified site: Secondary | ICD-10-CM

## 2023-05-07 DIAGNOSIS — M542 Cervicalgia: Secondary | ICD-10-CM | POA: Diagnosis not present

## 2023-05-07 LAB — PULMONARY FUNCTION TEST
DL/VA % pred: 131 %
DL/VA: 5.49 ml/min/mmHg/L
DLCO unc % pred: 75 %
DLCO unc: 22.11 ml/min/mmHg
FEF 25-75 Post: 1.72 L/s
FEF 25-75 Pre: 1.6 L/s
FEF2575-%Change-Post: 7 %
FEF2575-%Pred-Post: 55 %
FEF2575-%Pred-Pre: 51 %
FEV1-%Change-Post: 0 %
FEV1-%Pred-Post: 48 %
FEV1-%Pred-Pre: 47 %
FEV1-Post: 1.84 L
FEV1-Pre: 1.83 L
FEV1FVC-%Change-Post: 2 %
FEV1FVC-%Pred-Pre: 105 %
FEV6-%Change-Post: -1 %
FEV6-%Pred-Post: 46 %
FEV6-%Pred-Pre: 47 %
FEV6-Post: 2.26 L
FEV6-Pre: 2.3 L
FEV6FVC-%Pred-Post: 104 %
FEV6FVC-%Pred-Pre: 104 %
FVC-%Change-Post: -1 %
FVC-%Pred-Post: 44 %
FVC-%Pred-Pre: 45 %
FVC-Post: 2.26 L
FVC-Pre: 2.3 L
Post FEV1/FVC ratio: 81 %
Post FEV6/FVC ratio: 100 %
Pre FEV1/FVC ratio: 79 %
Pre FEV6/FVC Ratio: 100 %
RV % pred: 80 %
RV: 1.91 L
TLC % pred: 58 %
TLC: 4.32 L

## 2023-05-07 LAB — BLOOD GAS, ARTERIAL
Acid-Base Excess: 12.4 mmol/L — ABNORMAL HIGH (ref 0.0–2.0)
Bicarbonate: 39.6 mmol/L — ABNORMAL HIGH (ref 20.0–28.0)
Drawn by: 21179
O2 Saturation: 89.9 %
Patient temperature: 37
pCO2 arterial: 61 mm[Hg] — ABNORMAL HIGH (ref 32–48)
pH, Arterial: 7.42 (ref 7.35–7.45)
pO2, Arterial: 58 mm[Hg] — ABNORMAL LOW (ref 83–108)

## 2023-05-07 MED ORDER — ALBUTEROL SULFATE (2.5 MG/3ML) 0.083% IN NEBU
2.5000 mg | INHALATION_SOLUTION | Freq: Once | RESPIRATORY_TRACT | Status: AC
  Administered 2023-05-07: 2.5 mg via RESPIRATORY_TRACT

## 2023-05-07 NOTE — Progress Notes (Signed)
Hi Mark [name], your bone density looks good T-score of 0.8 which is in the normal range.

## 2023-05-08 ENCOUNTER — Other Ambulatory Visit: Payer: Self-pay | Admitting: Nurse Practitioner

## 2023-05-08 DIAGNOSIS — K219 Gastro-esophageal reflux disease without esophagitis: Secondary | ICD-10-CM

## 2023-05-13 ENCOUNTER — Encounter: Payer: Self-pay | Admitting: Neurosurgery

## 2023-05-13 DIAGNOSIS — M5440 Lumbago with sciatica, unspecified side: Secondary | ICD-10-CM | POA: Diagnosis not present

## 2023-05-13 DIAGNOSIS — M546 Pain in thoracic spine: Secondary | ICD-10-CM | POA: Diagnosis not present

## 2023-05-13 DIAGNOSIS — N5201 Erectile dysfunction due to arterial insufficiency: Secondary | ICD-10-CM | POA: Diagnosis not present

## 2023-05-13 DIAGNOSIS — G35 Multiple sclerosis: Secondary | ICD-10-CM | POA: Diagnosis not present

## 2023-05-13 DIAGNOSIS — M542 Cervicalgia: Secondary | ICD-10-CM | POA: Diagnosis not present

## 2023-05-16 DIAGNOSIS — Z79899 Other long term (current) drug therapy: Secondary | ICD-10-CM | POA: Diagnosis not present

## 2023-05-16 DIAGNOSIS — G35 Multiple sclerosis: Secondary | ICD-10-CM | POA: Diagnosis not present

## 2023-05-21 DIAGNOSIS — B957 Other staphylococcus as the cause of diseases classified elsewhere: Secondary | ICD-10-CM | POA: Diagnosis not present

## 2023-05-21 DIAGNOSIS — T8454XD Infection and inflammatory reaction due to internal left knee prosthesis, subsequent encounter: Secondary | ICD-10-CM | POA: Diagnosis not present

## 2023-05-21 DIAGNOSIS — Z96652 Presence of left artificial knee joint: Secondary | ICD-10-CM | POA: Diagnosis not present

## 2023-05-21 DIAGNOSIS — G35 Multiple sclerosis: Secondary | ICD-10-CM | POA: Diagnosis not present

## 2023-05-23 ENCOUNTER — Ambulatory Visit
Admission: RE | Admit: 2023-05-23 | Discharge: 2023-05-23 | Disposition: A | Discharge status: Home / Self Care | Payer: Medicare Other | Source: Ambulatory Visit | Attending: Neurosurgery | Admitting: Neurosurgery

## 2023-05-23 DIAGNOSIS — G959 Disease of spinal cord, unspecified: Secondary | ICD-10-CM

## 2023-05-28 ENCOUNTER — Ambulatory Visit
Admission: RE | Admit: 2023-05-28 | Discharge: 2023-05-28 | Disposition: A | Discharge status: Home / Self Care | Payer: Medicare Other | Source: Ambulatory Visit | Attending: Neurosurgery | Admitting: Neurosurgery

## 2023-05-28 DIAGNOSIS — Z981 Arthrodesis status: Secondary | ICD-10-CM | POA: Diagnosis not present

## 2023-05-28 DIAGNOSIS — M545 Low back pain, unspecified: Secondary | ICD-10-CM | POA: Diagnosis not present

## 2023-05-28 DIAGNOSIS — M5124 Other intervertebral disc displacement, thoracic region: Secondary | ICD-10-CM | POA: Diagnosis not present

## 2023-05-30 ENCOUNTER — Other Ambulatory Visit: Payer: Self-pay | Admitting: Family Medicine

## 2023-05-30 DIAGNOSIS — R600 Localized edema: Secondary | ICD-10-CM

## 2023-06-03 DIAGNOSIS — M542 Cervicalgia: Secondary | ICD-10-CM | POA: Diagnosis not present

## 2023-06-06 DIAGNOSIS — J9611 Chronic respiratory failure with hypoxia: Secondary | ICD-10-CM | POA: Diagnosis not present

## 2023-06-09 ENCOUNTER — Other Ambulatory Visit: Payer: Medicare Other

## 2023-06-22 ENCOUNTER — Ambulatory Visit (HOSPITAL_BASED_OUTPATIENT_CLINIC_OR_DEPARTMENT_OTHER): Payer: Medicare Other | Attending: Pulmonary Disease | Admitting: Pulmonary Disease

## 2023-06-22 DIAGNOSIS — G35 Multiple sclerosis: Secondary | ICD-10-CM | POA: Diagnosis not present

## 2023-06-22 DIAGNOSIS — J986 Disorders of diaphragm: Secondary | ICD-10-CM | POA: Insufficient documentation

## 2023-06-22 DIAGNOSIS — G4733 Obstructive sleep apnea (adult) (pediatric): Secondary | ICD-10-CM | POA: Diagnosis not present

## 2023-06-26 DIAGNOSIS — Z96652 Presence of left artificial knee joint: Secondary | ICD-10-CM | POA: Diagnosis not present

## 2023-06-26 DIAGNOSIS — M00062 Staphylococcal arthritis, left knee: Secondary | ICD-10-CM | POA: Diagnosis not present

## 2023-06-27 ENCOUNTER — Telehealth: Payer: Self-pay | Admitting: Pulmonary Disease

## 2023-06-27 DIAGNOSIS — G4733 Obstructive sleep apnea (adult) (pediatric): Secondary | ICD-10-CM

## 2023-06-27 NOTE — Telephone Encounter (Signed)
He currently has a CPAP with 2 L of oxygen blended in. Titration study showing need for BiPAP 16/10 with 2 L of oxygen blended in.  If he is willing, please order auto BiPAP EPAP 8 cm, pressure support +6, IPAP maximum 18 with 2 L oxygen blended in  Please schedule routine office visit in 6 to 8 weeks

## 2023-06-27 NOTE — Procedures (Signed)
Patient Name: Mark [name], Mark [name] Date: 06/22/2023 Gender: Male D.O.B: 03-03-1961 Age (years): 64 Referring Provider: Cyril Mourning MD, ABSM Height (inches): 72 Interpreting Physician: Cyril Mourning MD, ABSM Weight (lbs): 300 RPSGT: [place], [place] BMI: 41 MRN: [id number] Neck Size: 20.00 <br> <br> CLINICAL INFORMATION The patient is referred for a BiPAP titration to treat sleep apnea.  PMH : Right hemidiaphragm paralysis ,multiple sclerosis   NPSG 10/2021 severe, AHI 34/h, lowest desat 62% , 2L O2 added N PSG 06/2012 -358 pounds -AHI 98/hour, corrected by CPAP 11 cm  SLEEP STUDY TECHNIQUE As per the AASM Manual for the Scoring of Sleep and Associated Events v2.3 (April 2016) with a hypopnea requiring 4% desaturations.  The channels recorded and monitored were frontal, central and occipital EEG, electrooculogram (EOG), submentalis EMG (chin), nasal and oral airflow, thoracic and abdominal wall motion, anterior tibialis EMG, snore microphone, electrocardiogram, and pulse oximetry. Bilevel positive airway pressure (BPAP) was initiated at the beginning of the study and titrated to treat sleep-disordered breathing.  MEDICATIONS Medications self-administered by patient taken the night of the study : N/A  RESPIRATORY PARAMETERS Optimal IPAP Pressure (cm): 16 AHI at Optimal Pressure (/hr) 0 Optimal EPAP Pressure (cm): 10   Overall Minimal O2 (%): 81.0 Minimal O2 at Optimal Pressure (%): 83.0 SLEEP ARCHITECTURE Start Time: 10:31:08 PM Stop Time: 4:34:21 AM Total Time (min): 363.2 Total Sleep Time (min): 295.5 Sleep Latency (min): 11.4 Sleep Efficiency (%): 81.4% REM Latency (min): 310.5 WASO (min): 56.3 Stage N1 (%): 4.4% Stage N2 (%): 86.5% Stage N3 (%): 2.9% Stage R (%): 6.3 Supine (%): 100.00 Arousal Index (/hr): 8.3     CARDIAC DATA The 2 lead EKG demonstrated sinus rhythm. The mean heart rate was 62.6 beats per minute. Other EKG findings include: None.   LEG MOVEMENT  DATA The total Periodic Limb Movements of Sleep (PLMS) were 0. The PLMS index was 0.0. A PLMS index of <15 is considered normal in adults.  IMPRESSIONS - An optimal pressure may be bipap of 16/10. Events persisted on CPAP 15 cm & bilevel was required due to intolerance of high pressures - Central sleep apnea was not noted during this titration (CAI = 1/h). - Moderete oxygen desaturations were observed during this titration (min O2 = 81.0%). O2 was titrated to 2L due to persistent hypoxia on high levels of pressure - The patient snored with soft snoring volume. - No cardiac abnormalities were observed during this study. - Clinically significant periodic limb movements were not noted during this study. Arousals associated with PLMs were rare.   DIAGNOSIS - Obstructive Sleep Apnea (G47.33)   RECOMMENDATIONS - Recommend a trial of  bilevel 16/10 cm with 2 L O2 blended in - Avoid alcohol, sedatives and other CNS depressants that may worsen sleep apnea and disrupt normal sleep architecture. - Sleep hygiene should be reviewed to assess factors that may improve sleep quality. - Weight management and regular exercise should be initiated or continued. - Return to [facility] for re-evaluation after 4 weeks of therapy  [Electronically signed] 06/27/2023 04:43 PM  Cyril Mourning MD, ABSM Diplomate, American Board of Sleep Medicine NPI: [id number]

## 2023-06-30 ENCOUNTER — Ambulatory Visit (HOSPITAL_BASED_OUTPATIENT_CLINIC_OR_DEPARTMENT_OTHER): Payer: Medicare Other | Admitting: Pulmonary Disease

## 2023-06-30 ENCOUNTER — Encounter (HOSPITAL_BASED_OUTPATIENT_CLINIC_OR_DEPARTMENT_OTHER): Payer: Self-pay | Admitting: Pulmonary Disease

## 2023-06-30 VITALS — BP 112/72 | HR 98 | Resp 18 | Ht 72.0 in | Wt 292.6 lb

## 2023-06-30 DIAGNOSIS — J986 Disorders of diaphragm: Secondary | ICD-10-CM

## 2023-06-30 DIAGNOSIS — J9611 Chronic respiratory failure with hypoxia: Secondary | ICD-10-CM | POA: Diagnosis not present

## 2023-06-30 DIAGNOSIS — G4733 Obstructive sleep apnea (adult) (pediatric): Secondary | ICD-10-CM

## 2023-06-30 NOTE — Progress Notes (Signed)
Subjective:    Patient ID: Mark [name]., male    DOB: May 21, 1961, 62 y.o.   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  We also gave him Lasix, he diuresed from 292 to 286 pounds.  03/2023 OV >>Much improved, unclear if this was due to diuresis about 6 pounds versus poor CPAP usage in the setting of diaphragm paralysis.  He does not desaturate on ambulation      Significant tests/ events reviewed   CTA chest 02/21/23 >> Chronic mild elevation of the right hemidiaphragm with associated passive right lung base atelectasis. Dilated main pulmonary artery, suggesting pulmonary arterial hypertension. Ascending thoracic aortic 4.7 cm aneurysm.   CT chest from 03/2021 shows clear lungs, Thoracic aortic aneurysm 4.4 cm   PFTs 12/2018 show ratio 81 with FVC 52%, TLC 44% and DLCO 84%   PFTs 03/2018 show ratio 78 with FVC 78%, TLC 65% with DLCO 85%   NPSG 10/2021 severe, AHI 34/h, lowest desat 62% , 2L O2 added N PSG 06/2012 -358 pounds -AHI 98/hour, corrected by CPAP 11 cm Titration study 12/204 BiPAP 16/10 with 2 L of oxygen blended in   Review of Systems neg for any significant sore throat, dysphagia, itching, sneezing, nasal congestion or excess/ purulent secretions, fever, chills, sweats, unintended wt loss, pleuritic or exertional cp, hempoptysis, orthopnea pnd or change in chronic leg swelling. Also denies presyncope, palpitations, heartburn, abdominal pain, nausea, vomiting, diarrhea or change in bowel or urinary habits, dysuria,hematuria, rash, arthralgias,  visual complaints, headache, numbness weakness or ataxia.     Objective:   Physical Exam  Gen. Pleasant, obese, in no distress ENT - no lesions, no post nasal drip Neck: No JVD, no thyromegaly, no carotid bruits Lungs: no use of accessory muscles, no dullness to percussion, decreased without rales or rhonchi  Cardiovascular: Rhythm regular, heart sounds  normal, no murmurs or gallops, no peripheral edema Musculoskeletal: No deformities, no cyanosis or clubbing , no tremors       Assessment & Plan:    Assessment and Plan    Severe Obstructive Sleep Apnea Severe obstructive sleep apnea requiring CPAP with 2 liters of oxygen. Recent sleep study indicated the need for BiPAP 16/10 with 2 liters of oxygen. Compliance with CPAP therapy averaging 7 hours per night has reduced apnea events from 30 to 0.3 per hour. Discussed potential benefits of BiPAP, including higher pressure support and better synchronization with breathing, though dramatic improvements are not expected. - Send prescription for BiPAP 16/10 with 2 liters of oxygen to [facility] - Follow up if no contact from provider within 2 weeks  Right Diaphragm Paralysis Right diaphragm paralysis likely secondary to nerve compression. Persistent wheezing and difficulty breathing, especially when lying down. MRI showed nerve compression around the disc. Scheduled for a CT scan of the neck to evaluate hardware and potential need for neck surgery, though surgery is unlikely to resolve diaphragm paralysis. CPAP/BiPAP therapy is the primary management. - Continue CPAP/BiPAP therapy - Proceed with CT scan of the neck - Consider neck surgery if recommended, but do  not expect it to resolve diaphragm paralysis  Weight Management Current weight is 292 lbs, down from 355 lbs a year and a half ago. Further weight loss to around 250 lbs may alleviate diaphragm-related symptoms. - Encourage continued weight loss efforts  CHronic resp failure  with hypoxia Maintaining compliance with CPAP/BiPAP therapy and monitoring oxygen levels. Portable oxygen device for daytime use as needed. - Maintain compliance with CPAP/BiPAP therapy - Use portable oxygen as needed during the day  Follow-up - Follow up with the provider if no contact within 2 weeks regarding BiPAP switch.      Preop clearance for neck surgery -he will be at mild risk due to obesity and intermittent hypoxia but okay to proceed with 2 precautions.  Take perioperative precautions for sleep apnea

## 2023-06-30 NOTE — Patient Instructions (Signed)
Rx for BipAP will be sent  Use oxygen as needed

## 2023-06-30 NOTE — Telephone Encounter (Signed)
Patient was in office today and agreed to have it switched to Bipap. Order has been placed.

## 2023-07-01 DIAGNOSIS — G35 Multiple sclerosis: Secondary | ICD-10-CM | POA: Diagnosis not present

## 2023-07-01 DIAGNOSIS — R208 Other disturbances of skin sensation: Secondary | ICD-10-CM | POA: Diagnosis not present

## 2023-07-01 DIAGNOSIS — G629 Polyneuropathy, unspecified: Secondary | ICD-10-CM | POA: Diagnosis not present

## 2023-07-01 DIAGNOSIS — Z5181 Encounter for therapeutic drug level monitoring: Secondary | ICD-10-CM | POA: Diagnosis not present

## 2023-07-01 DIAGNOSIS — G8381 Brown-Sequard syndrome: Secondary | ICD-10-CM | POA: Diagnosis not present

## 2023-07-01 DIAGNOSIS — R5383 Other fatigue: Secondary | ICD-10-CM | POA: Diagnosis not present

## 2023-07-01 DIAGNOSIS — Z79899 Other long term (current) drug therapy: Secondary | ICD-10-CM | POA: Diagnosis not present

## 2023-07-02 ENCOUNTER — Other Ambulatory Visit: Payer: Self-pay | Admitting: Family Medicine

## 2023-07-02 DIAGNOSIS — R600 Localized edema: Secondary | ICD-10-CM

## 2023-07-03 ENCOUNTER — Encounter: Payer: Self-pay | Admitting: Neurosurgery

## 2023-07-03 ENCOUNTER — Other Ambulatory Visit: Payer: Self-pay | Admitting: Neurosurgery

## 2023-07-03 DIAGNOSIS — M542 Cervicalgia: Secondary | ICD-10-CM

## 2023-07-06 DIAGNOSIS — J9611 Chronic respiratory failure with hypoxia: Secondary | ICD-10-CM | POA: Diagnosis not present

## 2023-07-07 ENCOUNTER — Telehealth: Payer: Self-pay | Admitting: Cardiology

## 2023-07-07 DIAGNOSIS — I712 Thoracic aortic aneurysm, without rupture, unspecified: Secondary | ICD-10-CM

## 2023-07-07 DIAGNOSIS — G4733 Obstructive sleep apnea (adult) (pediatric): Secondary | ICD-10-CM | POA: Diagnosis not present

## 2023-07-07 NOTE — Telephone Encounter (Signed)
Patient identification verified by 2 forms. Marilynn Rail, RN    Called and spoke to patients wife Donaciano Eva states:   -was advised patient needs a CTA every 6 months to review aortic aneurysm   -Last CTA completed 02/2023, showed aneurysm increased to 4.7CM compared to 07/2022 imaging  Informed patient message sent to Dr. Jens Som for assistance with order request

## 2023-07-07 NOTE — Telephone Encounter (Signed)
New Message:    Patient wife is calling to schedule patient's CT. I do not se an order for a CT, Wife is very concerned. She said in August ,the aneurysm had grown since his last Ct.She would like to have the CT asap please.

## 2023-07-08 ENCOUNTER — Ambulatory Visit
Admission: RE | Admit: 2023-07-08 | Discharge: 2023-07-08 | Disposition: A | Discharge status: Home / Self Care | Payer: Medicare Other | Source: Ambulatory Visit | Attending: Neurosurgery | Admitting: Neurosurgery

## 2023-07-08 DIAGNOSIS — Z981 Arthrodesis status: Secondary | ICD-10-CM | POA: Diagnosis not present

## 2023-07-08 DIAGNOSIS — M4802 Spinal stenosis, cervical region: Secondary | ICD-10-CM | POA: Diagnosis not present

## 2023-07-08 DIAGNOSIS — M542 Cervicalgia: Secondary | ICD-10-CM

## 2023-07-08 NOTE — Telephone Encounter (Signed)
Spoke with pt wife, aware CTA due in feb at the [facility]. Order placed

## 2023-07-14 ENCOUNTER — Ambulatory Visit (INDEPENDENT_AMBULATORY_CARE_PROVIDER_SITE_OTHER): Payer: Medicare Other | Admitting: Physician Assistant

## 2023-07-14 ENCOUNTER — Encounter: Payer: Self-pay | Admitting: Physician Assistant

## 2023-07-14 VITALS — BP 120/82 | HR 77 | Temp 98.4°F | Resp 16 | Ht 72.0 in | Wt 295.0 lb

## 2023-07-14 DIAGNOSIS — R21 Rash and other nonspecific skin eruption: Secondary | ICD-10-CM

## 2023-07-14 DIAGNOSIS — L299 Pruritus, unspecified: Secondary | ICD-10-CM

## 2023-07-14 MED ORDER — TRIAMCINOLONE ACETONIDE 0.1 % EX CREA
1.0000 | TOPICAL_CREAM | Freq: Two times a day (BID) | CUTANEOUS | 1 refills | Status: DC
Start: 2023-07-14 — End: 2023-08-26

## 2023-07-14 MED ORDER — HYDROXYZINE PAMOATE 25 MG PO CAPS
25.0000 mg | ORAL_CAPSULE | Freq: Three times a day (TID) | ORAL | 0 refills | Status: DC | PRN
Start: 2023-07-14 — End: 2023-08-26

## 2023-07-14 MED ORDER — METHYLPREDNISOLONE 4 MG PO TBPK
ORAL_TABLET | ORAL | 0 refills | Status: DC
Start: 2023-07-14 — End: 2023-08-01

## 2023-07-14 NOTE — Progress Notes (Signed)
Acute Office Visit  Subjective:     Patient ID: Mark Dimitri., male    DOB: 1961-03-06, 62 y.o.   MRN: [id number]  Chief Complaint  Patient presents with   Rash    Rash bilateral abdomen onset 1 week , otc hydrocortisone,calmine lotion     HPI Patient is in today for bilateral abdominal rash for the last week that is very itchy for the last week. Denies any pain, oozing, vesicles. Denies any use of new medications, lotions, soaps or detergents. Hydrocortisone and calamine lotion OTC have not been helping.   No fever, chills, body aches.   .. 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   Cervical osteoarthritis 09/15/2013   Degenerative arthritis of lumbar spine 09/15/2013   Neuropathy 10/25/2013   Submucosal colonic leiomyoma 09/15/2013   OSA (obstructive sleep apnea) 11/23/2014   Severe obesity (BMI >= 40) (HCC) 11/23/2014   Hearing loss 12/13/2014   HNP (herniated nucleus pulposus), lumbar 04/17/2015   Chronic cough 10/02/2015   Morbid obesity (HCC) 01/03/2016   Elevated BP without diagnosis of hypertension 01/03/2016   Left lumbar radiculitis 02/13/2016   Spinal stenosis of thoracolumbar region 05/17/2016   Myalgia 08/20/2016   Anemia 08/21/2016   Other male erectile dysfunction 11/12/2018   Essential hypertension 11/12/2018   Paraparesis (HCC) 03/20/2016   Pain in both lower extremities 01/15/2016   Male hypogonadism 12/08/2015   Increased frequency of urination 10/30/2018   Fatigue 09/11/2015   Encounter for monitoring immunomodulating therapy 09/11/2015   Burning sensation 09/09/2016   BPH (benign prostatic  hyperplasia) 12/08/2015   Blurry vision 10/30/2018   Fatty liver 07/30/2019   Ascending aortic aneurysm (HCC) 07/30/2019   Ground glass opacity present on imaging of lung 07/30/2019   HNP (herniated nucleus pulposus), cervical 09/29/2019   Spoon nails 02/20/2021   Restrictive lung disease 05/29/2021   Irritable larynx 04/15/2022   IFG (impaired fasting glucose) 09/05/2022   Preoperative respiratory examination 10/18/2022   Chronic respiratory failure with hypoxia (HCC) 03/05/2023   Resolved Ambulatory Problems    Diagnosis Date Noted   Adiposity 09/15/2013   Obstructive apnea 10/25/2013   Viral myositis 09/27/2015   Acute respiratory failure (HCC) 08/20/2022   Past Medical History:  Diagnosis Date   Anxiety    Arthritis    Chronic pain    Constipation due to opioid therapy    COPD (chronic obstructive pulmonary disease) (HCC)    Depression    Dysphonia    Dyspnea    GI bleed    Hypertension    Neuromuscular disorder (HCC) 2014   Pneumonia    Sleep apnea    Umbilical hernia      ROS  See HPI.     Objective:    BP 120/82   Pulse 77   Temp 98.4 F (36.9 C) (Oral)   Resp 16   Ht 6' (1.829 m)   Wt 295 lb (133.8 kg)   SpO2 94%   BMI 40.01 kg/m  BP Readings from Last 3 Encounters:  07/14/23 120/82  06/30/23 112/72  04/24/23 139/80   Wt Readings from Last 3 Encounters:  07/14/23 295 lb (133.8 kg)  06/30/23 292 lb 9.6 oz (132.7 kg)  06/22/23 300 lb (136.1 kg)      Physical Exam Constitutional:      Appearance: Normal appearance.  HENT:     Head: Normocephalic.  Cardiovascular:     Rate and Rhythm: Normal rate.  Pulmonary:     Effort: Pulmonary effort is normal.  Skin:    Comments: Bilateral abdominal macular papular rash with erythema. Irregular borders. No scales. No vesicles or pustules. Both sides approximately 4cm by 5cm.   Neurological:     General: No focal deficit present.     Mental Status: He is alert and oriented to person, place, and  time.  Psychiatric:        Mood and Affect: Mood normal.           Assessment & Plan:  Marland KitchenMarland KitchenGerard was seen today for rash.  Diagnoses and all orders for this visit:  Rash and nonspecific skin eruption -     triamcinolone cream (KENALOG) 0.1 %; Apply 1 Application topically 2 (two) times daily. Over rash. -     methylPREDNISolone (MEDROL DOSEPAK) 4 MG TBPK tablet; Take as directed by package insert.  Pruritus -     triamcinolone cream (KENALOG) 0.1 %; Apply 1 Application topically 2 (two) times daily. Over rash. -     hydrOXYzine (VISTARIL) 25 MG capsule; Take 1 capsule (25 mg total) by mouth every 8 (eight) hours as needed. -     methylPREDNISolone (MEDROL DOSEPAK) 4 MG TBPK tablet; Take as directed by package insert.   Unclear etiology of rash but appears more like atopic dermatitis from irritation from clothes and cold weather. Pt denies any use of new substances. Very pruritic.  Start medrol dose pack Topical steroid for localized rash Hydroxyzine for itching as needed Follow up if symptoms worsen or do not improve.   Tandy Gaw, PA-C

## 2023-07-14 NOTE — Patient Instructions (Addendum)
Start medrol dose pack  Hydroxyzine for itching Triamcinolone topically  Atopic Dermatitis Atopic dermatitis is a skin disorder that causes inflammation of the skin. It is marked by a red rash and itchy, dry, scaly skin. It is the most common type of eczema. Eczema is a group of skin conditions that cause the skin to become rough and swollen. This condition is generally worse during the cooler winter months and often improves during the warm summer months. Atopic dermatitis usually starts showing signs in infancy and can last through adulthood. This condition cannot be passed from one person to another (is not contagious). Atopic dermatitis may not always be present, but when it is, it is called a flare-up. What are the causes? The exact cause of this condition is not known. Flare-ups may be triggered by: Coming in contact with something that you are sensitive or allergic to (allergen). Stress. Certain foods. Extremely hot or cold weather. Harsh chemicals and soaps. Dry air. Chlorine. What increases the risk? This condition is more likely to develop in people who have a personal or family history of: Eczema. Allergies. Asthma. Hay fever. What are the signs or symptoms? Symptoms of this condition include: Dry, scaly skin. Red, itchy rash. Itchiness, which can be severe. This may occur before the skin rash. This can make sleeping difficult. Skin thickening and cracking that can occur over time. How is this diagnosed? This condition is diagnosed based on: Your symptoms. Your medical history. A physical exam. How is this treated? There is no cure for this condition, but symptoms can usually be controlled. Treatment focuses on: Controlling the itchiness and scratching. You may be given medicines, such as antihistamines or steroid creams. Limiting exposure to allergens. Recognizing situations that cause stress and developing a plan to manage stress. If your atopic dermatitis does not  get better with medicines, or if it is all over your body (widespread), a treatment using a specific type of light (phototherapy) may be used. Follow these instructions at home: Skin care  Keep your skin well moisturized. Doing this seals in moisture and helps to prevent dryness. Use unscented lotions that have petroleum in them. Avoid lotions that contain alcohol or water. They can dry the skin. Keep baths or showers short (less than 5 minutes) in warm water. Do not use hot water. Use mild, unscented cleansers for bathing. Avoid soap and bubble bath. Apply a moisturizer to your skin right after a bath or shower. Do not apply anything to your skin without checking with your health care provider. General instructions Take or apply over-the-counter and prescription medicines only as told by your health care provider. Dress in clothes made of cotton or cotton blends. Dress lightly because heat increases itchiness. When washing your clothes, rinse your clothes twice so all of the soap is removed. Avoid any triggers that can cause a flare-up. Keep your fingernails cut short. Avoid scratching. Scratching makes the rash and itchiness worse. A break in the skin from scratching could result in a skin infection (impetigo). Do not be around people who have cold sores or fever blisters. If you get the infection, it may cause your atopic dermatitis to worsen. Keep all follow-up visits. This is important. Contact a health care provider if: Your itchiness interferes with sleep. Your rash gets worse or is not better within one week of starting treatment. You have a fever. You have a rash flare-up after having contact with someone who has cold sores or fever blisters. Get help right away  if: You develop pus or soft yellow scabs in the rash area. Summary Atopic dermatitis causes a red rash and itchy, dry, scaly skin. Treatment focuses on controlling the itchiness and scratching, limiting exposure to  things that you are sensitive or allergic to (allergens), recognizing situations that cause stress, and developing a plan to manage stress. Keep your skin well moisturized. Keep baths or showers shorter than 5 minutes and use warm water. Do not use hot water. This information is not intended to replace advice given to you by your health care provider. Make sure you discuss any questions you have with your health care provider. Document Revised: 04/17/2020 Document Reviewed: 04/17/2020 Elsevier Patient Education  2023 [employer].

## 2023-07-29 DIAGNOSIS — G35 Multiple sclerosis: Secondary | ICD-10-CM | POA: Diagnosis not present

## 2023-07-29 DIAGNOSIS — G959 Disease of spinal cord, unspecified: Secondary | ICD-10-CM | POA: Diagnosis not present

## 2023-07-30 DIAGNOSIS — Z96652 Presence of left artificial knee joint: Secondary | ICD-10-CM | POA: Diagnosis not present

## 2023-07-30 DIAGNOSIS — G35 Multiple sclerosis: Secondary | ICD-10-CM | POA: Diagnosis not present

## 2023-07-30 DIAGNOSIS — B957 Other staphylococcus as the cause of diseases classified elsewhere: Secondary | ICD-10-CM | POA: Diagnosis not present

## 2023-07-30 DIAGNOSIS — T8454XD Infection and inflammatory reaction due to internal left knee prosthesis, subsequent encounter: Secondary | ICD-10-CM | POA: Diagnosis not present

## 2023-07-31 ENCOUNTER — Ambulatory Visit: Payer: Medicare Other | Admitting: Family Medicine

## 2023-08-01 ENCOUNTER — Other Ambulatory Visit: Payer: Self-pay | Admitting: Family Medicine

## 2023-08-01 ENCOUNTER — Encounter: Payer: Self-pay | Admitting: Family Medicine

## 2023-08-01 ENCOUNTER — Ambulatory Visit (INDEPENDENT_AMBULATORY_CARE_PROVIDER_SITE_OTHER): Payer: Medicare Other | Admitting: Family Medicine

## 2023-08-01 VITALS — BP 127/79 | HR 86 | Ht 72.0 in | Wt 295.0 lb

## 2023-08-01 DIAGNOSIS — R7301 Impaired fasting glucose: Secondary | ICD-10-CM | POA: Diagnosis not present

## 2023-08-01 DIAGNOSIS — I10 Essential (primary) hypertension: Secondary | ICD-10-CM

## 2023-08-01 DIAGNOSIS — R609 Edema, unspecified: Secondary | ICD-10-CM

## 2023-08-01 DIAGNOSIS — R600 Localized edema: Secondary | ICD-10-CM

## 2023-08-01 DIAGNOSIS — K760 Fatty (change of) liver, not elsewhere classified: Secondary | ICD-10-CM | POA: Diagnosis not present

## 2023-08-01 LAB — POCT GLYCOSYLATED HEMOGLOBIN (HGB A1C): Hemoglobin A1C: 5.6 % (ref 4.0–5.6)

## 2023-08-01 MED ORDER — POTASSIUM CHLORIDE CRYS ER 10 MEQ PO TBCR: 10.0000 meq | EXTENDED_RELEASE_TABLET | Freq: Every day | ORAL | 1 refills | Status: DC

## 2023-08-01 MED ORDER — LOSARTAN POTASSIUM-HCTZ 50-12.5 MG PO TABS: 1.0000 | ORAL_TABLET | Freq: Every day | ORAL | 1 refills | Status: DC

## 2023-08-01 MED ORDER — FUROSEMIDE 20 MG PO TABS: 20.0000 mg | ORAL_TABLET | Freq: Every day | ORAL | 1 refills | Status: AC | PRN

## 2023-08-01 NOTE — Assessment & Plan Note (Signed)
 Recent liver function looked normal.  Continue to monitor.

## 2023-08-01 NOTE — Telephone Encounter (Signed)
 Spoke w/amber she stated that this is automated and when he picked up his last refill it will sent out the request for another refill.

## 2023-08-01 NOTE — Telephone Encounter (Signed)
 Call the pharmacy.  We just refilled this last month and he should have had a refill on it.  I am not sure why were getting another refill request.

## 2023-08-01 NOTE — Assessment & Plan Note (Signed)
 He is taking the furosemide almost every day.  Recent potassium levels were normal but on the low end.  May add a low-dose potassium pill to take daily.

## 2023-08-01 NOTE — Assessment & Plan Note (Signed)
 A1c looks fantastic today at 5.6 he is really done a great job in bringing that down.  He said he cut back from a gallon of sweet tea a day to about a half of a gallon.  And then he is drinking water.  He is try to be a little bit more careful with his diet as well just encouraged him to keep up the great work and we will plan to recheck again in 6 months.

## 2023-08-01 NOTE — Progress Notes (Signed)
 Established Patient Office Visit  Subjective  Patient ID: Mark DELENA Rogena Mickey., male    DOB: Feb 07, 1961  Age: 63 y.o. MRN: [id number]  Chief Complaint  Patient presents with   ifg    HPI Impaired fasting glucose-no increased thirst or urination. No symptoms consistent with hypoglycemia.  He actually just had a home which blood work done with Dr. Malvin again with infectious disease.  He did let me know that he is going to be having cervical spine surgery probably in the next couple of months they can infuse 2 levels.  For the itchy spot on his side when it flares he is usually able to take the hydroxyzine  and use the cream and it does seem to help and go away.  For his lower extremity swelling he has been taking his Lasix  almost every day.  ROS    Objective:     BP 127/79   Pulse 86   Ht 6' (1.829 m)   Wt 295 lb (133.8 kg)   SpO2 96%   BMI 40.01 kg/m     Physical Exam Vitals and nursing note reviewed.  Constitutional:      Appearance: Normal appearance.  HENT:     Head: Normocephalic and atraumatic.  Eyes:     Conjunctiva/sclera: Conjunctivae normal.  Cardiovascular:     Rate and Rhythm: Normal rate and regular rhythm.  Pulmonary:     Effort: Pulmonary effort is normal.     Breath sounds: Normal breath sounds.  Skin:    General: Skin is warm and dry.  Neurological:     Mental Status: He is alert.  Psychiatric:        Mood and Affect: Mood normal.      Results for orders placed or performed in visit on 08/01/23  POCT HgB A1C  Result Value Ref Range   Hemoglobin A1C 5.6 4.0 - 5.6 %   HbA1c POC (<> result, manual entry)     HbA1c, POC (prediabetic range)     HbA1c, POC (controlled diabetic range)        The ASCVD Risk score (Arnett DK, et al., 2019) failed to calculate for the following reasons:   The valid total cholesterol range is 130 to 320 mg/dL    Assessment & Plan:   Problem List Items Addressed This Visit       Cardiovascular and  Mediastinum   Essential hypertension   Well controlled. Continue current regimen. Follow up in  59mo       Relevant Medications   furosemide  (LASIX ) 20 MG tablet   losartan -hydrochlorothiazide  (HYZAAR) 50-12.5 MG tablet     Digestive   Fatty liver   Recent liver function looked normal.  Continue to monitor.        Endocrine   IFG (impaired fasting glucose) - Primary   A1c looks fantastic today at 5.6 he is really done a great job in bringing that down.  He said he cut back from a gallon of sweet tea a day to about a half of a gallon.  And then he is drinking water.  He is try to be a little bit more careful with his diet as well just encouraged him to keep up the great work and we will plan to recheck again in 6 months.      Relevant Orders   POCT HgB A1C (Completed)     Other   LEG EDEMA, BILATERAL   He is taking the furosemide  almost every day.  Recent potassium levels were normal but on the low end.  May add a low-dose potassium pill to take daily.      Relevant Medications   potassium chloride  (KLOR-CON  M) 10 MEQ tablet   Other Visit Diagnoses       Lower extremity edema       Relevant Medications   furosemide  (LASIX ) 20 MG tablet       Return in about 6 months (around 01/29/2024) for Pre-diabetes.    Dorothyann Byars, MD

## 2023-08-01 NOTE — Assessment & Plan Note (Signed)
 Well controlled. Continue current regimen. Follow up in  6 mo

## 2023-08-04 ENCOUNTER — Ambulatory Visit (HOSPITAL_BASED_OUTPATIENT_CLINIC_OR_DEPARTMENT_OTHER): Payer: Medicare Other | Admitting: Pulmonary Disease

## 2023-08-05 ENCOUNTER — Ambulatory Visit (HOSPITAL_COMMUNITY)
Admission: RE | Admit: 2023-08-05 | Discharge: 2023-08-05 | Disposition: A | Discharge status: Home / Self Care | Payer: Medicare Other | Source: Ambulatory Visit | Attending: Cardiology | Admitting: Cardiology

## 2023-08-05 DIAGNOSIS — I7121 Aneurysm of the ascending aorta, without rupture: Secondary | ICD-10-CM | POA: Diagnosis not present

## 2023-08-05 DIAGNOSIS — R9389 Abnormal findings on diagnostic imaging of other specified body structures: Secondary | ICD-10-CM | POA: Diagnosis not present

## 2023-08-05 DIAGNOSIS — J9811 Atelectasis: Secondary | ICD-10-CM | POA: Diagnosis not present

## 2023-08-05 DIAGNOSIS — I712 Thoracic aortic aneurysm, without rupture, unspecified: Secondary | ICD-10-CM

## 2023-08-05 DIAGNOSIS — G35 Multiple sclerosis: Secondary | ICD-10-CM | POA: Diagnosis not present

## 2023-08-05 MED ORDER — IOHEXOL 350 MG/ML SOLN
100.0000 mL | Freq: Once | INTRAVENOUS | Status: AC | PRN
  Administered 2023-08-05: 100 mL via INTRAVENOUS

## 2023-08-06 DIAGNOSIS — J9611 Chronic respiratory failure with hypoxia: Secondary | ICD-10-CM | POA: Diagnosis not present

## 2023-08-06 LAB — POCT I-STAT CREATININE: Creatinine, Ser: 1 mg/dL (ref 0.61–1.24)

## 2023-08-07 DIAGNOSIS — G4733 Obstructive sleep apnea (adult) (pediatric): Secondary | ICD-10-CM | POA: Diagnosis not present

## 2023-08-11 ENCOUNTER — Encounter: Payer: Self-pay | Admitting: *Deleted

## 2023-08-11 ENCOUNTER — Other Ambulatory Visit: Payer: Self-pay | Admitting: *Deleted

## 2023-08-11 DIAGNOSIS — I7123 Aneurysm of the descending thoracic aorta, without rupture: Secondary | ICD-10-CM

## 2023-08-11 DIAGNOSIS — I712 Thoracic aortic aneurysm, without rupture, unspecified: Secondary | ICD-10-CM

## 2023-08-12 DIAGNOSIS — M5440 Lumbago with sciatica, unspecified side: Secondary | ICD-10-CM | POA: Diagnosis not present

## 2023-08-12 DIAGNOSIS — M542 Cervicalgia: Secondary | ICD-10-CM | POA: Diagnosis not present

## 2023-08-12 DIAGNOSIS — M546 Pain in thoracic spine: Secondary | ICD-10-CM | POA: Diagnosis not present

## 2023-08-12 DIAGNOSIS — F1120 Opioid dependence, uncomplicated: Secondary | ICD-10-CM | POA: Diagnosis not present

## 2023-08-19 DIAGNOSIS — G35 Multiple sclerosis: Secondary | ICD-10-CM | POA: Diagnosis not present

## 2023-08-26 ENCOUNTER — Ambulatory Visit: Payer: Medicare Other

## 2023-08-26 VITALS — Ht 73.0 in | Wt 292.0 lb

## 2023-08-26 DIAGNOSIS — L299 Pruritus, unspecified: Secondary | ICD-10-CM

## 2023-08-26 DIAGNOSIS — R21 Rash and other nonspecific skin eruption: Secondary | ICD-10-CM

## 2023-08-26 DIAGNOSIS — Z0000 Encounter for general adult medical examination without abnormal findings: Secondary | ICD-10-CM

## 2023-08-26 MED ORDER — TRIAMCINOLONE ACETONIDE 0.1 % EX CREA: 1.0000 | TOPICAL_CREAM | Freq: Two times a day (BID) | CUTANEOUS | 1 refills | Status: DC

## 2023-08-26 MED ORDER — HYDROXYZINE PAMOATE 25 MG PO CAPS: 25.0000 mg | ORAL_CAPSULE | Freq: Three times a day (TID) | ORAL | 0 refills | Status: DC | PRN

## 2023-08-26 NOTE — Progress Notes (Signed)
 Subjective:   Mark [name]. is a 63 y.o. male who presents for Medicare Annual/Subsequent preventive examination.  Visit Complete: Virtual I connected with  Mark DELENA Howden Jr. on 08/26/23 by a audio enabled telemedicine application and verified that I am speaking with the correct person using two identifiers.  Patient location. He is in [place] with a friend  Provider Location: Office/Clinic  I discussed the limitations of evaluation and management by telemedicine. The patient expressed understanding and agreed to proceed.  Vital Signs: Because this visit was a virtual/telehealth visit, some criteria may be missing or patient reported. Any vitals not documented were not able to be obtained and vitals that have been documented are patient reported.  Patient Medicare AWV questionnaire was completed by the patient on 07/30/2023; I have confirmed that all information answered by patient is correct and no changes since this date.  Cardiac Risk Factors include: advanced age (>55men, >61 women);obesity (BMI >30kg/m2)     Objective:    Today's Vitals   08/26/23 1409 08/26/23 1410  Weight: 292 lb (132.5 kg)   Height: 6' 1 (1.854 m)   PainSc:  7    Body mass index is 38.52 kg/m.     08/26/2023    2:30 PM 06/22/2023    8:17 PM 08/23/2022    3:37 PM 08/20/2022   10:00 PM 08/17/2021    3:04 PM 09/27/2019   11:23 AM 06/21/2019   11:07 AM  Advanced Directives  Does Patient Have a Medical Advance Directive? No;Yes Yes Yes Yes Yes Yes Yes  Type of Estate Agent of [place];Living will Healthcare Power of [place];Living will Living will Healthcare Power of [place];Living will Living will Healthcare Power of [place];Living will Healthcare Power of [place];Living will  Does patient want to make changes to medical advance directive? No - Patient declined No - Patient declined No - Patient declined No - Patient declined No - Patient declined  No - Patient  declined  Copy of Healthcare Power of Attorney in Chart? No - copy requested No - copy requested No - copy requested No - copy requested  Yes - validated most recent copy scanned in chart (See row information) No - copy requested  Would patient like information on creating a medical advance directive? No - Patient declined  No - Patient declined        Current Medications (verified) Outpatient Encounter Medications as of 08/26/2023  Medication Sig   albuterol  (VENTOLIN  HFA) 108 (90 Base) MCG/ACT inhaler Inhale 2 puffs into the lungs every 6 (six) hours as needed for wheezing or shortness of breath.   amantadine  (SYMMETREL ) 100 MG capsule Take 100 mg by mouth 3 (three) times daily.   amphetamine -dextroamphetamine  (ADDERALL  XR) 30 MG 24 hr capsule Take 30 mg by mouth every morning.   baclofen (LIORESAL) 10 MG tablet Take 10 mg by mouth 3 (three) times daily.   DULoxetine  (CYMBALTA ) 60 MG capsule Take 60 mg by mouth 2 (two) times daily.    famotidine  (PEPCID ) 20 MG tablet TAKE ONE TABLET BY MOUTH TWICE DAILY   fluticasone  (FLONASE ) 50 MCG/ACT nasal spray Place 1 spray into both nostrils daily.   furosemide  (LASIX ) 20 MG tablet Take 1 tablet (20 mg total) by mouth daily as needed.   gabapentin  (NEURONTIN ) 800 MG tablet Take 800 mg by mouth 4 (four) times daily.   HYDROcodone -acetaminophen  (NORCO) 10-325 MG tablet Take 2 tablets by mouth every 4 (four) hours as needed for severe pain.  hydrOXYzine  (VISTARIL ) 25 MG capsule Take 1 capsule (25 mg total) by mouth every 8 (eight) hours as needed.   loratadine  (CLARITIN ) 10 MG tablet Take by mouth.   losartan -hydrochlorothiazide  (HYZAAR) 50-12.5 MG tablet Take 1 tablet by mouth daily.   morphine  (MS CONTIN ) 60 MG 12 hr tablet Take 60 mg by mouth every 12 (twelve) hours.   potassium chloride  (KLOR-CON  M) 10 MEQ tablet Take 1 tablet (10 mEq total) by mouth daily. When you take your furosemide .   triamcinolone  cream (KENALOG ) 0.1 % Apply 1 Application  topically 2 (two) times daily. Over rash.   [DISCONTINUED] hydrOXYzine  (VISTARIL ) 25 MG capsule Take 1 capsule (25 mg total) by mouth every 8 (eight) hours as needed.   [DISCONTINUED] triamcinolone  cream (KENALOG ) 0.1 % Apply 1 Application topically 2 (two) times daily. Over rash.   No facility-administered encounter medications on file as of 08/26/2023.    Allergies (verified) Patient has no known allergies.   History: Past Medical History:  Diagnosis Date   Anxiety    chronic pain treated with narcotics   Arthritis    Chronic pain    Constipation due to opioid therapy    COPD (chronic obstructive pulmonary disease) (HCC)    Depression    Dysphonia    thoracic left sidedimpingement with heminumbness   Dyspnea    GI bleed    abt 15 years ago   Hypertension    Morbid obesity (HCC)    MS (multiple sclerosis) (HCC) 02/11/2013   ABAGIO  Per. Dr Gretel   Neuromuscular disorder [facility]) 2014   Pneumonia    Preoperative respiratory examination 10/18/2022   Sleep apnea    Sleep apnea with use of continuous positive airway pressure (CPAP) 02/11/2013   07-13-12 AHi of 98.6 /hr titrated to 11 cm water ,  average user time 4 hours and 4 minutes. Residual AHI 1.20 December 2012 .   Umbilical hernia    Past Surgical History:  Procedure Laterality Date   ANTERIOR CERVICAL DECOMP/DISCECTOMY FUSION N/A 09/29/2019   Procedure: Anterior Cervical Decompression Fusion - Cervical Four-Cervical Five;  Surgeon: Onetha Kuba, MD;  Location: [facility] OR;  Service: Neurosurgery;  Laterality: N/A;  Anterior Cervical Decompression Fusion - Cervical Four-Cervical Five    CARPAL TUNNEL RELEASE Right    Dr. Onetha CARRY TUNNEL RELEASE Right 09/29/2019   Procedure: Carpal Tunnel Release;  Surgeon: Onetha Kuba, MD;  Location: [facility] OR;  Service: Neurosurgery;  Laterality: Right;   COLONOSCOPY W/ POLYPECTOMY     ESOPHAGOGASTRODUODENOSCOPY     MULTIPLE TOOTH EXTRACTIONS     POSTERIOR LUMBAR FUSION  04/17/2015   Dr. Onetha,  also removed old hardware   SPINE SURGERY     total of six vertebras fused, three lumbar surgeries and one anterior neck fusion   TONSILLECTOMY     Family History  Problem Relation Age of Onset   Arrhythmia Mother    Cancer Mother    Cancer Other    Cancer Other    Sleep walking Son    Obesity Sister    Social History   Socioeconomic History   Marital status: Married    Spouse name: Randine   Number of children: 2   Years of education: 12+   Highest education level: Some college, no degree  Occupational History   Occupation: research scientist (life sciences) upholstry shop    Comment: disablity  Tobacco Use   Smoking status: Never    Passive exposure: Past   Smokeless tobacco: Never   Tobacco  comments:    Occasional cigar  Vaping Use   Vaping status: Never Used  Substance and Sexual Activity   Alcohol use: Not Currently    Comment: occasionally   Drug use: No   Sexual activity: Not Currently  Other Topics Concern   Not on file  Social History Narrative   Lives with his wife. They have two children. He enjoys playing video games.      Social Drivers of Corporate Investment Banker Strain: Low Risk  (08/26/2023)   Overall Financial Resource Strain (CARDIA)    Difficulty of Paying Living Expenses: Not hard at all  Food Insecurity: No Food Insecurity (08/26/2023)   Hunger Vital Sign    Worried About Running Out of Food in the Last Year: Never true    Ran Out of Food in the Last Year: Never true  Transportation Needs: No Transportation Needs (08/26/2023)   PRAPARE - Administrator, Civil Service (Medical): No    Lack of Transportation (Non-Medical): No  Physical Activity: Inactive (08/26/2023)   Exercise Vital Sign    Days of Exercise per Week: 0 days    Minutes of Exercise per Session: 0 min  Stress: No Stress Concern Present (08/26/2023)   [employer] of Occupational Health - Occupational Stress Questionnaire    Feeling of Stress : Not at all  Social Connections: Moderately  Integrated (08/26/2023)   Social Connection and Isolation Panel [NHANES]    Frequency of Communication with Friends and Family: More than three times a week    Frequency of Social Gatherings with Friends and Family: Once a week    Attends Religious Services: More than 4 times per year    Active Member of [employer] or Organizations: No    Attends Engineer, Structural: Never    Marital Status: Married    Tobacco Counseling Counseling given: Not Answered Tobacco comments: Occasional cigar   Clinical Intake:  Pre-visit preparation completed: Yes  Pain : 0-10 Pain Score: 7  Pain Type: Chronic pain Pain Location: Back Pain Orientation: Lower Pain Descriptors / Indicators: Aching Pain Onset: More than a month ago Pain Frequency: Constant Pain Relieving Factors: pain medication Effect of Pain on Daily Activities: He states he is not as active due to the pain. He is going to have surgery soon.  Pain Relieving Factors: pain medication  BMI - recorded: 38.52 Nutritional Status: BMI > 30  Obese Nutritional Risks: None Diabetes: No  How often do you need to have someone help you when you read instructions, pamphlets, or other written materials from your doctor or pharmacy?: 1 - Never What is the last grade level you completed in school?: 16  Interpreter Needed?: No      Activities of Daily Living    08/26/2023    2:14 PM  In your present state of health, do you have any difficulty performing the following activities:  Hearing? 1  Comment Hearing aids/ he doesn't currently have them.  Vision? 0  Comment wears glasses  Difficulty concentrating or making decisions? 1  Comment Has MS. He has a specialist.  Walking or climbing stairs? 1  Comment MS. He has a specialist.  Dressing or bathing? 0  Doing errands, shopping? 0  Preparing Food and eating ? N  Using the Toilet? N  In the past six months, have you accidently leaked urine? Y  Comment He has to watch how much he  drinks before going to bed.  Do you have problems  with loss of bowel control? N  Managing your Medications? N  Managing your Finances? N  Housekeeping or managing your Housekeeping? N    Patient Care Team: Alvan Dorothyann BIRCH, MD as PCP - General (Family Medicine) Pietro Redell RAMAN, MD as PCP - Cardiology (Cardiology) Onetha Kuba, MD as Consulting Physician (Neurosurgery) Dr. Alm Poisson (Neurology) Delphia Alm, MD (Urology) Dohmeier, Dedra, MD as Consulting Physician (Neurology) Carolee Sherwood BIRCH DOUGLAS, MD as Consulting Physician (Urology)  Indicate any recent Medical Services you may have received from other than [facility] providers in the past year (date may be approximate).     Assessment:   This is a routine wellness examination for Darrian.  Hearing/Vision screen Hearing Screening - Comments:: Unable to check.  Vision Screening - Comments:: Unable to check   Goals Addressed             This Visit's Progress    Weight (lb) < 200 lb (90.7 kg)   292 lb (132.5 kg)    He would like to lose some weight.       Depression Screen    08/26/2023    2:28 PM 09/05/2022    3:29 PM 08/23/2022    3:30 PM 04/05/2022    2:51 PM 08/17/2021    3:00 PM 02/20/2021   11:35 AM 06/21/2019   11:09 AM  PHQ 2/9 Scores  PHQ - 2 Score 1 0 0 2 0 2 0  PHQ- 9 Score      6     Fall Risk    08/26/2023    2:32 PM 09/05/2022    3:29 PM 08/23/2022    3:30 PM 04/05/2022    2:52 PM 08/17/2021    3:00 PM  Fall Risk   Falls in the past year? 1 0 1 1 1   Number falls in past yr: 1 0 1 1 0  Injury with Fall? 0 0 0 0 0  Risk for fall due to : Impaired mobility No Fall Risks History of fall(s) History of fall(s) History of fall(s)  Risk for fall due to: Comment He has MS. He does see a specialist.      Follow up Falls evaluation completed Falls evaluation completed Falls prevention discussed;Education provided;Falls evaluation completed Falls evaluation completed Falls evaluation completed;Education provided     MEDICARE RISK AT HOME: Medicare Risk at Home Any stairs in or around the home?: Yes If so, are there any without handrails?: Yes Home free of loose throw rugs in walkways, pet beds, electrical cords, etc?: Yes Adequate lighting in your home to reduce risk of falls?: Yes Life alert?: No Use of a cane, walker or w/c?: Yes Grab bars in the bathroom?: No Shower chair or bench in shower?: No Elevated toilet seat or a handicapped toilet?: Yes  TIMED UP AND GO:  Was the test performed?  No    Cognitive Function:        08/26/2023    2:34 PM 08/23/2022    3:35 PM 08/17/2021    3:09 PM 06/21/2019   11:12 AM  6CIT Screen  What Year? 0 points 0 points 0 points 0 points  What month? 0 points 0 points 0 points 0 points  What time? 0 points 0 points 0 points 0 points  Count back from 20 0 points 0 points 0 points 0 points  Months in reverse 2 points 0 points 0 points 0 points  Repeat phrase 0 points 0 points 0 points 0 points  Total Score  2 points 0 points 0 points 0 points    Immunizations Immunization History  Administered Date(s) Administered   Tdap 01/04/2015    TDAP status: Up to date  Flu Vaccine status: Declined, Education has been provided regarding the importance of this vaccine but patient still declined. Advised may receive this vaccine at local pharmacy or Health Dept. Aware to provide a copy of the vaccination record if obtained from local pharmacy or Health Dept. Verbalized acceptance and understanding.  Pneumococcal vaccine status: Up to date  Covid-19 vaccine status: Declined, Education has been provided regarding the importance of this vaccine but patient still declined. Advised may receive this vaccine at local pharmacy or Health Dept.or vaccine clinic. Aware to provide a copy of the vaccination record if obtained from local pharmacy or Health Dept. Verbalized acceptance and understanding.  Qualifies for Shingles Vaccine? Yes   Zostavax completed No   Shingrix  Completed?: No.    Education has been provided regarding the importance of this vaccine. Patient has been advised to call insurance company to determine out of pocket expense if they have not yet received this vaccine. Advised may also receive vaccine at local pharmacy or Health Dept. Verbalized acceptance and understanding.  Screening Tests Health Maintenance  Topic Date Due   COVID-19 Vaccine (1) Never done   Pneumococcal Vaccine 64-59 Years old (1 of 2 - PCV) Never done   Zoster Vaccines- Shingrix (1 of 2) Never done   INFLUENZA VACCINE  10/20/2023 (Originally 02/20/2023)   Medicare Annual Wellness (AWV)  08/25/2024   DTaP/Tdap/Td (2 - Td or Tdap) 01/03/2025   Colonoscopy  04/30/2027   Hepatitis C Screening  Completed   HIV Screening  Completed   HPV VACCINES  Aged Out    Health Maintenance  Health Maintenance Due  Topic Date Due   COVID-19 Vaccine (1) Never done   Pneumococcal Vaccine 12-3 Years old (1 of 2 - PCV) Never done   Zoster Vaccines- Shingrix (1 of 2) Never done    Colorectal cancer screening: Type of screening: Colonoscopy. Completed 04/29/2022. Repeat every 5 years  Lung Cancer Screening: (Low Dose CT Chest recommended if Age 37-80 years, 20 pack-year currently smoking OR have quit w/in 15years.) does not qualify.   Lung Cancer Screening Referral: n/a  Additional Screening:  Hepatitis C Screening: does qualify; Completed 12/21/2019  Vision Screening: Recommended annual ophthalmology exams for early detection of glaucoma and other disorders of the eye. Is the patient up to date with their annual eye exam?  Yes  Who is the provider or what is the name of the office in which the patient attends annual eye exams? Patient doesn't remember the name.  If pt is not established with a provider, would they like to be referred to a provider to establish care?  N/a .   Dental Screening: Recommended annual dental exams for proper oral hygiene   Community Resource Referral /  Chronic Care Management: CRR required this visit?  No   CCM required this visit?  No     Plan:     I have personally reviewed and noted the following in the patient's chart:   Medical and social history Use of alcohol, tobacco or illicit drugs  Current medications and supplements including opioid prescriptions. Patient is currently taking opioid prescriptions. Information provided to patient regarding non-opioid alternatives. Patient advised to discuss non-opioid treatment plan with their provider. Functional ability and status Nutritional status Physical activity Advanced directives List of other physicians Hospitalizations, surgeries, and  ER visits in previous 12 months Vitals Screenings to include cognitive, depression, and falls Referrals and appointments  In addition, I have reviewed and discussed with patient certain preventive protocols, quality metrics, and best practice recommendations. A written personalized care plan for preventive services as well as general preventive health recommendations were provided to patient.     Bonny Jon Mayor, CMA   08/26/2023   After Visit Summary: (MyChart) Due to this being a telephonic visit, the after visit summary with patients personalized plan was offered to patient via MyChart   Nurse Notes:   Mark DELENA Rogena Mickey. is here for Medicare wellness visit. He states he likes to play video games.   He is due for Flu, Covid and Shingles vaccine. He doesn't want to take at this time.

## 2023-08-26 NOTE — Patient Instructions (Signed)
  Mr. Fees , Thank you for taking time to come for your Medicare Wellness Visit. I appreciate your ongoing commitment to your health goals. Please review the following plan we discussed and let me know if I can assist you in the future.   These are the goals we discussed:  Goals       Patient Stated      Patient would like to cut back on his sweet tea drinking a day. Drinks a half gallon of sweet tea daily.      Patient Stated (pt-stated)      Would like to loose 10 lbs.      Patient Stated (pt-stated)      Patient stated that he would ike to spend more time with the grandkids      Weight (lb) < 200 lb (90.7 kg)      He would like to lose some weight.         This is a list of the screening recommended for you and due dates:  Health Maintenance  Topic Date Due   COVID-19 Vaccine (1) Never done   Pneumococcal Vaccination (1 of 2 - PCV) Never done   Zoster (Shingles) Vaccine (1 of 2) Never done   Flu Shot  10/20/2023*   Medicare Annual Wellness Visit  08/25/2024   DTaP/Tdap/Td vaccine (2 - Td or Tdap) 01/03/2025   Colon Cancer Screening  04/30/2027   Hepatitis C Screening  Completed   HIV Screening  Completed   HPV Vaccine  Aged Out  *Topic was postponed. The date shown is not the original due date.

## 2023-09-02 DIAGNOSIS — G959 Disease of spinal cord, unspecified: Secondary | ICD-10-CM | POA: Diagnosis not present

## 2023-09-02 DIAGNOSIS — G35 Multiple sclerosis: Secondary | ICD-10-CM | POA: Diagnosis not present

## 2023-09-02 DIAGNOSIS — M5440 Lumbago with sciatica, unspecified side: Secondary | ICD-10-CM | POA: Diagnosis not present

## 2023-09-05 ENCOUNTER — Telehealth (HOSPITAL_BASED_OUTPATIENT_CLINIC_OR_DEPARTMENT_OTHER): Payer: Medicare Other | Admitting: Pulmonary Disease

## 2023-09-06 DIAGNOSIS — J9611 Chronic respiratory failure with hypoxia: Secondary | ICD-10-CM | POA: Diagnosis not present

## 2023-09-07 DIAGNOSIS — G4733 Obstructive sleep apnea (adult) (pediatric): Secondary | ICD-10-CM | POA: Diagnosis not present

## 2023-09-19 ENCOUNTER — Other Ambulatory Visit: Payer: Self-pay | Admitting: Physician Assistant

## 2023-09-19 DIAGNOSIS — R21 Rash and other nonspecific skin eruption: Secondary | ICD-10-CM

## 2023-09-19 DIAGNOSIS — L299 Pruritus, unspecified: Secondary | ICD-10-CM

## 2023-09-23 ENCOUNTER — Other Ambulatory Visit: Payer: Self-pay | Admitting: Physician Assistant

## 2023-09-23 DIAGNOSIS — L299 Pruritus, unspecified: Secondary | ICD-10-CM

## 2023-10-04 DIAGNOSIS — J9611 Chronic respiratory failure with hypoxia: Secondary | ICD-10-CM | POA: Diagnosis not present

## 2023-10-09 DIAGNOSIS — G35 Multiple sclerosis: Secondary | ICD-10-CM | POA: Diagnosis not present

## 2023-10-09 DIAGNOSIS — M4326 Fusion of spine, lumbar region: Secondary | ICD-10-CM | POA: Diagnosis not present

## 2023-10-09 DIAGNOSIS — M4325 Fusion of spine, thoracolumbar region: Secondary | ICD-10-CM | POA: Diagnosis not present

## 2023-10-13 DIAGNOSIS — M4325 Fusion of spine, thoracolumbar region: Secondary | ICD-10-CM | POA: Diagnosis not present

## 2023-10-13 DIAGNOSIS — G35 Multiple sclerosis: Secondary | ICD-10-CM | POA: Diagnosis not present

## 2023-10-13 DIAGNOSIS — M4326 Fusion of spine, lumbar region: Secondary | ICD-10-CM | POA: Diagnosis not present

## 2023-10-14 ENCOUNTER — Other Ambulatory Visit: Payer: Self-pay | Admitting: Family Medicine

## 2023-10-14 DIAGNOSIS — L299 Pruritus, unspecified: Secondary | ICD-10-CM

## 2023-10-15 DIAGNOSIS — M4326 Fusion of spine, lumbar region: Secondary | ICD-10-CM | POA: Diagnosis not present

## 2023-10-15 DIAGNOSIS — G35 Multiple sclerosis: Secondary | ICD-10-CM | POA: Diagnosis not present

## 2023-10-15 DIAGNOSIS — M4325 Fusion of spine, thoracolumbar region: Secondary | ICD-10-CM | POA: Diagnosis not present

## 2023-10-16 ENCOUNTER — Other Ambulatory Visit: Payer: Self-pay | Admitting: Family Medicine

## 2023-10-16 DIAGNOSIS — L299 Pruritus, unspecified: Secondary | ICD-10-CM

## 2023-10-16 NOTE — Telephone Encounter (Signed)
 Copied from CRM [id number]. Topic: Clinical - Prescription Issue >> Oct 16, 2023 11:39 AM Marlow Baars wrote: Reason for CRM: The spouse of the patient called stating they spoke with the pharmacy about getting the refill for hydrOXYzine (VISTARIL) 25 MG capsule and it was denied stating request too soon. She says he has been taking as directed 1 every 8 hrs and he was prescribed that on 3/05. Please assist further by calling [phone] as the patient has been out since Tuesday

## 2023-10-17 ENCOUNTER — Other Ambulatory Visit: Payer: Self-pay | Admitting: Family Medicine

## 2023-10-17 DIAGNOSIS — M4326 Fusion of spine, lumbar region: Secondary | ICD-10-CM | POA: Diagnosis not present

## 2023-10-17 DIAGNOSIS — L299 Pruritus, unspecified: Secondary | ICD-10-CM

## 2023-10-17 DIAGNOSIS — M4325 Fusion of spine, thoracolumbar region: Secondary | ICD-10-CM | POA: Diagnosis not present

## 2023-10-17 DIAGNOSIS — G35 Multiple sclerosis: Secondary | ICD-10-CM | POA: Diagnosis not present

## 2023-10-17 NOTE — Telephone Encounter (Signed)
 Copied from CRM [id number]. Topic: Clinical - Medication Refill >> Oct 17, 2023  4:05 PM Mark [name] wrote: Most Recent Primary Care Visit:  Provider: Chalmers Cater  Department: [facility]  Visit Type: MEDICARE AWV, SEQUENTIAL  Date: 08/26/2023  Medication: hydrOXYzine (VISTARIL) 25 MG capsule [[id number]]  Third request. Wife is upset. Patient going into the weekend with no medication. Out since Tuesday.  Has the patient contacted their pharmacy? Yes (Agent: If no, request that the patient contact the pharmacy for the refill. If patient does not wish to contact the pharmacy document the reason why and proceed with request.) (Agent: If yes, when and what did the pharmacy advise?)  Is this the correct pharmacy for this prescription? Yes If no, delete pharmacy and type the correct one.  This is the patient's preferred pharmacy:  [facility] [place], [place] - [address] [place] [address] Phone: [phone] Fax: [phone]   Has the prescription been filled recently? Yes  Is the patient out of the medication? Yes  Has the patient been seen for an appointment in the last year OR does the patient have an upcoming appointment? Yes  Can we respond through MyChart? Yes  Agent: Please be advised that Rx refills may take up to 3 business days. We ask that you follow-up with your pharmacy.

## 2023-10-20 DIAGNOSIS — G35 Multiple sclerosis: Secondary | ICD-10-CM | POA: Diagnosis not present

## 2023-10-20 DIAGNOSIS — M4326 Fusion of spine, lumbar region: Secondary | ICD-10-CM | POA: Diagnosis not present

## 2023-10-20 DIAGNOSIS — M4325 Fusion of spine, thoracolumbar region: Secondary | ICD-10-CM | POA: Diagnosis not present

## 2023-10-20 NOTE — Telephone Encounter (Signed)
 Requesting rx rf  Hydroxyzine 25mg   Last written 09/24/2023 Hydrocodone accet 10-325mg  Last written 09/29/2019 by outside provider  Mark [name] Last OV 09/30/2022 Upcoming appt = none

## 2023-10-20 NOTE — Telephone Encounter (Signed)
 Medication refilled for hydroxyzine.  Did not fill per request for hydrocodone as I do not write this for him it comes from an outside provider.

## 2023-10-21 NOTE — Telephone Encounter (Signed)
 Patient wife informed. States hydrocodone was requested from our office in error.

## 2023-10-22 ENCOUNTER — Other Ambulatory Visit (HOSPITAL_COMMUNITY): Payer: Self-pay

## 2023-10-22 DIAGNOSIS — M4326 Fusion of spine, lumbar region: Secondary | ICD-10-CM | POA: Diagnosis not present

## 2023-10-22 DIAGNOSIS — M4325 Fusion of spine, thoracolumbar region: Secondary | ICD-10-CM | POA: Diagnosis not present

## 2023-10-22 DIAGNOSIS — G35 Multiple sclerosis: Secondary | ICD-10-CM | POA: Diagnosis not present

## 2023-10-22 MED ORDER — MORPHINE SULFATE ER 60 MG PO TBCR
60.0000 mg | EXTENDED_RELEASE_TABLET | Freq: Two times a day (BID) | ORAL | 0 refills | Status: DC
  Filled 2023-10-22: qty 60, 30d supply, fill #0

## 2023-10-24 DIAGNOSIS — M4326 Fusion of spine, lumbar region: Secondary | ICD-10-CM | POA: Diagnosis not present

## 2023-10-24 DIAGNOSIS — M4325 Fusion of spine, thoracolumbar region: Secondary | ICD-10-CM | POA: Diagnosis not present

## 2023-10-24 DIAGNOSIS — G35 Multiple sclerosis: Secondary | ICD-10-CM | POA: Diagnosis not present

## 2023-10-27 ENCOUNTER — Other Ambulatory Visit: Payer: Self-pay | Admitting: Family Medicine

## 2023-10-27 DIAGNOSIS — G35 Multiple sclerosis: Secondary | ICD-10-CM | POA: Diagnosis not present

## 2023-10-27 DIAGNOSIS — R609 Edema, unspecified: Secondary | ICD-10-CM

## 2023-10-27 DIAGNOSIS — M4325 Fusion of spine, thoracolumbar region: Secondary | ICD-10-CM | POA: Diagnosis not present

## 2023-10-27 DIAGNOSIS — M4326 Fusion of spine, lumbar region: Secondary | ICD-10-CM | POA: Diagnosis not present

## 2023-10-29 DIAGNOSIS — M4326 Fusion of spine, lumbar region: Secondary | ICD-10-CM | POA: Diagnosis not present

## 2023-10-29 DIAGNOSIS — M4325 Fusion of spine, thoracolumbar region: Secondary | ICD-10-CM | POA: Diagnosis not present

## 2023-10-29 DIAGNOSIS — G35 Multiple sclerosis: Secondary | ICD-10-CM | POA: Diagnosis not present

## 2023-10-31 DIAGNOSIS — M4325 Fusion of spine, thoracolumbar region: Secondary | ICD-10-CM | POA: Diagnosis not present

## 2023-10-31 DIAGNOSIS — G35 Multiple sclerosis: Secondary | ICD-10-CM | POA: Diagnosis not present

## 2023-10-31 DIAGNOSIS — M4326 Fusion of spine, lumbar region: Secondary | ICD-10-CM | POA: Diagnosis not present

## 2023-11-04 DIAGNOSIS — G35 Multiple sclerosis: Secondary | ICD-10-CM | POA: Diagnosis not present

## 2023-11-04 DIAGNOSIS — M4325 Fusion of spine, thoracolumbar region: Secondary | ICD-10-CM | POA: Diagnosis not present

## 2023-11-04 DIAGNOSIS — M4326 Fusion of spine, lumbar region: Secondary | ICD-10-CM | POA: Diagnosis not present

## 2023-11-07 DIAGNOSIS — M4326 Fusion of spine, lumbar region: Secondary | ICD-10-CM | POA: Diagnosis not present

## 2023-11-07 DIAGNOSIS — G35 Multiple sclerosis: Secondary | ICD-10-CM | POA: Diagnosis not present

## 2023-11-07 DIAGNOSIS — M4325 Fusion of spine, thoracolumbar region: Secondary | ICD-10-CM | POA: Diagnosis not present

## 2023-11-10 DIAGNOSIS — M4325 Fusion of spine, thoracolumbar region: Secondary | ICD-10-CM | POA: Diagnosis not present

## 2023-11-10 DIAGNOSIS — M4326 Fusion of spine, lumbar region: Secondary | ICD-10-CM | POA: Diagnosis not present

## 2023-11-10 DIAGNOSIS — G35 Multiple sclerosis: Secondary | ICD-10-CM | POA: Diagnosis not present

## 2023-11-11 DIAGNOSIS — G35 Multiple sclerosis: Secondary | ICD-10-CM | POA: Diagnosis not present

## 2023-11-11 DIAGNOSIS — F1120 Opioid dependence, uncomplicated: Secondary | ICD-10-CM | POA: Diagnosis not present

## 2023-11-11 DIAGNOSIS — M5440 Lumbago with sciatica, unspecified side: Secondary | ICD-10-CM | POA: Diagnosis not present

## 2023-11-12 DIAGNOSIS — G35 Multiple sclerosis: Secondary | ICD-10-CM | POA: Diagnosis not present

## 2023-11-12 DIAGNOSIS — M4325 Fusion of spine, thoracolumbar region: Secondary | ICD-10-CM | POA: Diagnosis not present

## 2023-11-12 DIAGNOSIS — M4326 Fusion of spine, lumbar region: Secondary | ICD-10-CM | POA: Diagnosis not present

## 2023-11-14 DIAGNOSIS — M4326 Fusion of spine, lumbar region: Secondary | ICD-10-CM | POA: Diagnosis not present

## 2023-11-14 DIAGNOSIS — G35 Multiple sclerosis: Secondary | ICD-10-CM | POA: Diagnosis not present

## 2023-11-14 DIAGNOSIS — M4325 Fusion of spine, thoracolumbar region: Secondary | ICD-10-CM | POA: Diagnosis not present

## 2023-11-17 DIAGNOSIS — G35 Multiple sclerosis: Secondary | ICD-10-CM | POA: Diagnosis not present

## 2023-11-17 DIAGNOSIS — M4326 Fusion of spine, lumbar region: Secondary | ICD-10-CM | POA: Diagnosis not present

## 2023-11-17 DIAGNOSIS — M4325 Fusion of spine, thoracolumbar region: Secondary | ICD-10-CM | POA: Diagnosis not present

## 2023-11-18 ENCOUNTER — Other Ambulatory Visit (HOSPITAL_COMMUNITY): Payer: Self-pay

## 2023-11-18 DIAGNOSIS — S32009K Unspecified fracture of unspecified lumbar vertebra, subsequent encounter for fracture with nonunion: Secondary | ICD-10-CM | POA: Diagnosis not present

## 2023-11-18 DIAGNOSIS — Z6838 Body mass index (BMI) 38.0-38.9, adult: Secondary | ICD-10-CM | POA: Diagnosis not present

## 2023-11-18 DIAGNOSIS — G35 Multiple sclerosis: Secondary | ICD-10-CM | POA: Diagnosis not present

## 2023-11-18 DIAGNOSIS — M4316 Spondylolisthesis, lumbar region: Secondary | ICD-10-CM | POA: Diagnosis not present

## 2023-11-18 MED ORDER — PREDNISONE 10 MG (48) PO TBPK
ORAL_TABLET | ORAL | 0 refills | Status: DC
Start: 2023-11-18 — End: 2024-02-10
  Filled 2023-11-18: qty 48, 12d supply, fill #0

## 2023-11-19 ENCOUNTER — Other Ambulatory Visit (HOSPITAL_COMMUNITY): Payer: Self-pay

## 2023-11-19 ENCOUNTER — Other Ambulatory Visit: Payer: Self-pay | Admitting: Family Medicine

## 2023-11-19 DIAGNOSIS — L299 Pruritus, unspecified: Secondary | ICD-10-CM

## 2023-11-19 DIAGNOSIS — M4326 Fusion of spine, lumbar region: Secondary | ICD-10-CM | POA: Diagnosis not present

## 2023-11-19 DIAGNOSIS — G35 Multiple sclerosis: Secondary | ICD-10-CM | POA: Diagnosis not present

## 2023-11-19 DIAGNOSIS — M4325 Fusion of spine, thoracolumbar region: Secondary | ICD-10-CM | POA: Diagnosis not present

## 2023-11-19 MED ORDER — HYDROCODONE-ACETAMINOPHEN 10-325 MG PO TABS
1.0000 | ORAL_TABLET | ORAL | 0 refills | Status: DC | PRN
  Filled 2023-11-20: qty 150, 30d supply, fill #0

## 2023-11-19 MED ORDER — MORPHINE SULFATE ER 60 MG PO TBCR
60.0000 mg | EXTENDED_RELEASE_TABLET | Freq: Two times a day (BID) | ORAL | 0 refills | Status: DC
  Filled 2023-12-19: qty 60, 30d supply, fill #0

## 2023-11-19 MED ORDER — HYDROCODONE-ACETAMINOPHEN 10-325 MG PO TABS
1.0000 | ORAL_TABLET | ORAL | 0 refills | Status: DC | PRN
Start: 2023-11-11 — End: 2024-04-05
  Filled 2023-12-19: qty 150, 30d supply, fill #0

## 2023-11-19 MED ORDER — HYDROCODONE-ACETAMINOPHEN 10-325 MG PO TABS
1.0000 | ORAL_TABLET | ORAL | 0 refills | Status: DC | PRN
  Filled 2024-01-19 (×2): qty 150, 30d supply, fill #0

## 2023-11-19 MED ORDER — MORPHINE SULFATE ER 60 MG PO TBCR
60.0000 mg | EXTENDED_RELEASE_TABLET | Freq: Two times a day (BID) | ORAL | 0 refills | Status: DC
  Filled 2023-11-20: qty 60, 30d supply, fill #0

## 2023-11-19 MED ORDER — MORPHINE SULFATE ER 60 MG PO TBCR
60.0000 mg | EXTENDED_RELEASE_TABLET | Freq: Two times a day (BID) | ORAL | 0 refills | Status: DC
  Filled 2023-11-20 – 2024-01-19 (×2): qty 60, 30d supply, fill #0

## 2023-11-20 ENCOUNTER — Other Ambulatory Visit (HOSPITAL_COMMUNITY): Payer: Self-pay

## 2023-11-21 DIAGNOSIS — M4326 Fusion of spine, lumbar region: Secondary | ICD-10-CM | POA: Diagnosis not present

## 2023-11-21 DIAGNOSIS — G35 Multiple sclerosis: Secondary | ICD-10-CM | POA: Diagnosis not present

## 2023-11-21 DIAGNOSIS — M4325 Fusion of spine, thoracolumbar region: Secondary | ICD-10-CM | POA: Diagnosis not present

## 2023-11-27 DIAGNOSIS — M4326 Fusion of spine, lumbar region: Secondary | ICD-10-CM | POA: Diagnosis not present

## 2023-11-27 DIAGNOSIS — M4325 Fusion of spine, thoracolumbar region: Secondary | ICD-10-CM | POA: Diagnosis not present

## 2023-11-27 DIAGNOSIS — G35 Multiple sclerosis: Secondary | ICD-10-CM | POA: Diagnosis not present

## 2023-12-02 ENCOUNTER — Telehealth: Payer: Self-pay | Admitting: Cardiology

## 2023-12-02 DIAGNOSIS — G35 Multiple sclerosis: Secondary | ICD-10-CM | POA: Diagnosis not present

## 2023-12-02 DIAGNOSIS — M4326 Fusion of spine, lumbar region: Secondary | ICD-10-CM | POA: Diagnosis not present

## 2023-12-02 DIAGNOSIS — M4325 Fusion of spine, thoracolumbar region: Secondary | ICD-10-CM | POA: Diagnosis not present

## 2023-12-02 NOTE — Telephone Encounter (Signed)
 Pt's wife is requesting a callback regarding her wanting to discuss the CT ANGIO CHEST AORTA W &/OR WO CONTRAST being scheduled. Please advise

## 2023-12-02 NOTE — Telephone Encounter (Signed)
 Spoke with pt wife, aware order is placed for CTA in July.she was given the number to [facility] to call and schedule.

## 2023-12-04 DIAGNOSIS — G35 Multiple sclerosis: Secondary | ICD-10-CM | POA: Diagnosis not present

## 2023-12-04 DIAGNOSIS — M4325 Fusion of spine, thoracolumbar region: Secondary | ICD-10-CM | POA: Diagnosis not present

## 2023-12-04 DIAGNOSIS — M4326 Fusion of spine, lumbar region: Secondary | ICD-10-CM | POA: Diagnosis not present

## 2023-12-04 DIAGNOSIS — J9611 Chronic respiratory failure with hypoxia: Secondary | ICD-10-CM | POA: Diagnosis not present

## 2023-12-09 DIAGNOSIS — G35 Multiple sclerosis: Secondary | ICD-10-CM | POA: Diagnosis not present

## 2023-12-09 DIAGNOSIS — M4325 Fusion of spine, thoracolumbar region: Secondary | ICD-10-CM | POA: Diagnosis not present

## 2023-12-09 DIAGNOSIS — M4326 Fusion of spine, lumbar region: Secondary | ICD-10-CM | POA: Diagnosis not present

## 2023-12-10 ENCOUNTER — Ambulatory Visit: Admitting: Pulmonary Disease

## 2023-12-10 ENCOUNTER — Encounter: Payer: Self-pay | Admitting: Pulmonary Disease

## 2023-12-10 VITALS — BP 118/69 | HR 74 | Ht 73.0 in | Wt 294.0 lb

## 2023-12-10 DIAGNOSIS — E66813 Obesity, class 3: Secondary | ICD-10-CM

## 2023-12-10 DIAGNOSIS — G4730 Sleep apnea, unspecified: Secondary | ICD-10-CM

## 2023-12-10 DIAGNOSIS — G4733 Obstructive sleep apnea (adult) (pediatric): Secondary | ICD-10-CM

## 2023-12-10 DIAGNOSIS — G8929 Other chronic pain: Secondary | ICD-10-CM

## 2023-12-10 DIAGNOSIS — Z6838 Body mass index (BMI) 38.0-38.9, adult: Secondary | ICD-10-CM

## 2023-12-10 DIAGNOSIS — Z9981 Dependence on supplemental oxygen: Secondary | ICD-10-CM

## 2023-12-10 DIAGNOSIS — J984 Other disorders of lung: Secondary | ICD-10-CM

## 2023-12-10 DIAGNOSIS — J9620 Acute and chronic respiratory failure, unspecified whether with hypoxia or hypercapnia: Secondary | ICD-10-CM | POA: Diagnosis not present

## 2023-12-10 NOTE — Progress Notes (Signed)
 Mark [name]    [id number]    31-Jul-1960  Primary Care Physician:Metheney, Corita Diego, MD  Referring Physician: Cydney Draft, MD 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  Overall his health has been stable Still continues to have significant difficulty getting around because of knee pain  She does do some water aerobics and exercising, noted to have significant muscle weakness around his knees  Neck pain feels a lot better  He does have shortness of breath with exertion  He is on CPAP nightly and is very compliant with use  Wakes up feeling like his had a good nights rest  Has a history of chronic respiratory failure He has a history of MS-progressive symptoms History of cord compression for which she has had 2 surgeries previously Cord compression is what is believed to be causing his paralyzed hemidiaphragm  Chronic back pain for which he is on opiates - Back pain is fairly well-managed at present  Never smoker, history of obstructive sleep apnea, recurrent pneumonia  Was started on oxygen  supplementation for exertional desaturations  Recently had a sniff test confirming his paralyzed hemidiaphragm  Most recent PFT was June 2020 with total lung capacity of 44% and a DLCO of 84%  Previous sleep study April 2023 reveals severe obstructive sleep apnea with severe oxygen  desaturations, he is on a CPAP of 11  He is on opiates for analgesia, muscle relaxant, Cymbalta  for neuropathic pain, gabapentin   Outpatient Encounter Medications as of 12/10/2023  Medication Sig   albuterol  (VENTOLIN  HFA) 108 (90 Base) MCG/ACT inhaler Inhale 2 puffs into the lungs every 6 (six) hours as needed for wheezing or shortness of breath.    amantadine  (SYMMETREL ) 100 MG capsule Take 100 mg by mouth 3 (three) times daily.   amphetamine -dextroamphetamine  (ADDERALL  XR) 30 MG 24 hr capsule Take 30 mg by mouth every morning.   baclofen (LIORESAL) 10 MG tablet Take 10 mg by mouth 3 (three) times daily.   DULoxetine  (CYMBALTA ) 60 MG capsule Take 60 mg by mouth 2 (two) times daily.    famotidine  (PEPCID ) 20 MG tablet TAKE ONE TABLET BY MOUTH TWICE DAILY   furosemide  (LASIX ) 20 MG tablet Take 1 tablet (20 mg total) by mouth daily as needed.   HYDROcodone -acetaminophen  (NORCO) 10-325 MG tablet Take 2 tablets by mouth every 4 (four) hours as needed for severe pain.   HYDROcodone -acetaminophen  (NORCO) 10-325 MG tablet Take 1 tablet by mouth every 5 (five) hours as needed for pain. Max 5 tablets/day   HYDROcodone -acetaminophen  (NORCO) 10-325 MG tablet Take 1 tablet by mouth every 5 (five) hours as needed for pain.. Max 5 tablets/day.   HYDROcodone -acetaminophen  (NORCO) 10-325 MG tablet Take 1 tablet by mouth every 5 (five) hours as needed for pain. Max 5 tablets/day.   hydrOXYzine  (VISTARIL ) 25 MG capsule Take 1 capsule (25 mg total) by mouth every 8 (eight) hours as needed.   loratadine  (CLARITIN ) 10 MG tablet Take by mouth.   losartan -hydrochlorothiazide  (HYZAAR) 50-12.5 MG tablet Take 1 tablet by mouth daily.   morphine  (MS CONTIN ) 60 MG 12 hr tablet Take 60 mg by mouth every 12 (twelve) hours.   morphine  (MS CONTIN ) 60 MG 12 hr  tablet Take 1 tablet (60 mg total) by mouth every 12 (twelve) hours.   morphine  (MS CONTIN ) 60 MG 12 hr tablet Take 1 tablet (60 mg total) by mouth every 12 (twelve) hours.   morphine  (MS CONTIN ) 60 MG 12 hr tablet Take 1 tablet (60 mg total) by mouth every 12 (twelve) hours.   potassium chloride  (KLOR-CON  M) 10 MEQ tablet Take 1 tablet (10 mEq total) by mouth daily. When you take your furosemide .   triamcinolone  cream (KENALOG ) 0.1 % Apply 1 Application topically 2 (two) times daily. Over rash.   fluticasone   (FLONASE ) 50 MCG/ACT nasal spray Place 1 spray into both nostrils daily. (Patient not taking: Reported on 12/10/2023)   gabapentin  (NEURONTIN ) 800 MG tablet Take 800 mg by mouth 4 (four) times daily. (Patient not taking: Reported on 12/10/2023)   predniSONE  (STERAPRED UNI-PAK 48 TAB) 10 MG (48) TBPK tablet Take as directed (Patient not taking: Reported on 12/10/2023)   No facility-administered encounter medications on file as of 12/10/2023.    Allergies as of 12/10/2023   (No Known Allergies)    Past Medical History:  Diagnosis Date   Anxiety    chronic pain treated with narcotics   Arthritis    Chronic pain    Constipation due to opioid therapy    COPD (chronic obstructive pulmonary disease) (HCC)    Depression    Dysphonia    thoracic left sided"impingement" with heminumbness   Dyspnea    GI bleed    abt 15 years ago   Hypertension    Morbid obesity (HCC)    MS (multiple sclerosis) (HCC) 02/11/2013   ABAGIO  Per. Dr Serena Dana   Neuromuscular disorder [facility]) 2014   Pneumonia    Preoperative respiratory examination 10/18/2022   Sleep apnea    Sleep apnea with use of continuous positive airway pressure (CPAP) 02/11/2013   07-13-12 AHi of 98.6 /hr titrated to 11 cm water ,  average user time 4 hours and 4 minutes. Residual AHI 1.20 December 2012 .   Umbilical hernia     Past Surgical History:  Procedure Laterality Date   ANTERIOR CERVICAL DECOMP/DISCECTOMY FUSION N/A 09/29/2019   Procedure: Anterior Cervical Decompression Fusion - Cervical Four-Cervical Five;  Surgeon: Gearl Keens, MD;  Location: [facility] OR;  Service: Neurosurgery;  Laterality: N/A;  Anterior Cervical Decompression Fusion - Cervical Four-Cervical Five    CARPAL TUNNEL RELEASE Right    Dr. Angeline Kemps TUNNEL RELEASE Right 09/29/2019   Procedure: Carpal Tunnel Release;  Surgeon: Gearl Keens, MD;  Location: [facility] OR;  Service: Neurosurgery;  Laterality: Right;   COLONOSCOPY W/ POLYPECTOMY     ESOPHAGOGASTRODUODENOSCOPY      MULTIPLE TOOTH EXTRACTIONS     POSTERIOR LUMBAR FUSION  04/17/2015   Dr. Lamon Pillow, also removed old hardware   SPINE SURGERY     total of six vertebras fused, three lumbar surgeries and one anterior neck fusion   TONSILLECTOMY      Family History  Problem Relation Age of Onset   Arrhythmia Mother    Cancer Mother    Cancer Other    Cancer Other    Sleep walking Son    Obesity Sister     Social History   Socioeconomic History   Marital status: Married    Spouse name: Sherrlyn Dolores   Number of children: 2   Years of education: 12+   Highest education level: Some college, no degree  Occupational History   Occupation: Conservation officer, nature [employer]  Comment: disablity  Tobacco Use   Smoking status: Never    Passive exposure: Past   Smokeless tobacco: Never   Tobacco comments:    Occasional cigar  Vaping Use   Vaping status: Never Used  Substance and Sexual Activity   Alcohol use: Not Currently    Comment: occasionally   Drug use: No   Sexual activity: Not Currently  Other Topics Concern   Not on file  Social History Narrative   Lives with his wife. They have two children. He enjoys playing video games.      Social Drivers of Corporate investment banker Strain: Low Risk  (08/26/2023)   Overall Financial Resource Strain (CARDIA)    Difficulty of Paying Living Expenses: Not hard at all  Food Insecurity: No Food Insecurity (08/26/2023)   Hunger Vital Sign    Worried About Running Out of Food in the Last Year: Never true    Ran Out of Food in the Last Year: Never true  Transportation Needs: No Transportation Needs (08/26/2023)   PRAPARE - Administrator, Civil Service (Medical): No    Lack of Transportation (Non-Medical): No  Physical Activity: Inactive (08/26/2023)   Exercise Vital Sign    Days of Exercise per Week: 0 days    Minutes of Exercise per Session: 0 min  Stress: No Stress Concern Present (08/26/2023)   [employer] of Occupational Health - Occupational Stress  Questionnaire    Feeling of Stress : Not at all  Social Connections: Moderately Integrated (08/26/2023)   Social Connection and Isolation Panel [NHANES]    Frequency of Communication with Friends and Family: More than three times a week    Frequency of Social Gatherings with Friends and Family: Once a week    Attends Religious Services: More than 4 times per year    Active Member of [employer] or Organizations: No    Attends Banker Meetings: Never    Marital Status: Married  Catering manager Violence: Not At Risk (08/26/2023)   Humiliation, Afraid, Rape, and Kick questionnaire    Fear of Current or Ex-Partner: No    Emotionally Abused: No    Physically Abused: No    Sexually Abused: No    Review of Systems  Constitutional:  Positive for fatigue.  Respiratory:  Positive for apnea and shortness of breath.   Psychiatric/Behavioral:  Positive for sleep disturbance.     Vitals:   12/10/23 1131  BP: 118/69  Pulse: 74  SpO2: 95%     Physical Exam Constitutional:      Appearance: He is obese.  HENT:     Head: Normocephalic.     Mouth/Throat:     Mouth: Mucous membranes are moist.  Eyes:     Pupils: Pupils are equal, round, and reactive to light.  Cardiovascular:     Rate and Rhythm: Normal rate and regular rhythm.     Heart sounds: No murmur heard.    No friction rub.  Pulmonary:     Effort: No respiratory distress.     Breath sounds: No stridor. No wheezing or rhonchi.     Comments: Decreased air movement at the right base Musculoskeletal:     Cervical back: No rigidity or tenderness.  Neurological:     Mental Status: He is alert.  Psychiatric:        Mood and Affect: Mood normal.    Data Reviewed: Recent sleep study reviewed  Recent sleep study result reviewed  Most recent  compliance data over the last 30 days shows 100% compliance with average use of 6 hours 32 minutes BiPAP settings of 18/8 with a pressure support of 6 Residual AHI of  1.4  Assessment:  Chronic respiratory failure  Obstructive sleep apnea - Encouraged to continue using BiPAP on a nightly basis  Restrictive lung disease secondary to paralyzed hemidiaphragm  Class III obesity  Chronic pain  Did discuss possibility of using GLP-1 agonist to help in his weight loss efforts   Plan/Recommendations:  Continue BiPAP on a nightly basis  Download from his machine shows his BiPAP is working well  Continue to optimize pain management  Continue to work on weight loss efforts  Encouraged to discuss possible GLP-1 agonist with his primary care doctor for his weight loss  Continue graded activities as tolerated  Follow-up in about 6 months  Encouraged to call with significant concerns  Graded activities as tolerated   Myer Artis MD [facility] 12/10/2023, 11:51 AM  CC: Cydney Draft, *

## 2023-12-10 NOTE — Patient Instructions (Signed)
 The download from your machine shows your BiPAP is working very well  Continue using your BiPAP nightly  Continue graded activities as tolerated  Discussed weight loss medications with your primary doctor -I think this will be very helpful especially with the limitations you have with your knees -If this is added to your ongoing efforts to keep your weight in check, I think it will help greatly  Call us  with significant concerns  Follow-up 6 months from here

## 2023-12-12 DIAGNOSIS — M4325 Fusion of spine, thoracolumbar region: Secondary | ICD-10-CM | POA: Diagnosis not present

## 2023-12-12 DIAGNOSIS — G35 Multiple sclerosis: Secondary | ICD-10-CM | POA: Diagnosis not present

## 2023-12-12 DIAGNOSIS — M4326 Fusion of spine, lumbar region: Secondary | ICD-10-CM | POA: Diagnosis not present

## 2023-12-17 DIAGNOSIS — G35 Multiple sclerosis: Secondary | ICD-10-CM | POA: Diagnosis not present

## 2023-12-17 DIAGNOSIS — M4326 Fusion of spine, lumbar region: Secondary | ICD-10-CM | POA: Diagnosis not present

## 2023-12-17 DIAGNOSIS — M4325 Fusion of spine, thoracolumbar region: Secondary | ICD-10-CM | POA: Diagnosis not present

## 2023-12-19 ENCOUNTER — Other Ambulatory Visit (HOSPITAL_COMMUNITY): Payer: Self-pay

## 2023-12-19 DIAGNOSIS — M4326 Fusion of spine, lumbar region: Secondary | ICD-10-CM | POA: Diagnosis not present

## 2023-12-19 DIAGNOSIS — G35 Multiple sclerosis: Secondary | ICD-10-CM | POA: Diagnosis not present

## 2023-12-19 DIAGNOSIS — M4325 Fusion of spine, thoracolumbar region: Secondary | ICD-10-CM | POA: Diagnosis not present

## 2023-12-25 DIAGNOSIS — M00062 Staphylococcal arthritis, left knee: Secondary | ICD-10-CM | POA: Diagnosis not present

## 2023-12-25 DIAGNOSIS — D84821 Immunodeficiency due to drugs: Secondary | ICD-10-CM | POA: Diagnosis not present

## 2023-12-25 DIAGNOSIS — Z9889 Other specified postprocedural states: Secondary | ICD-10-CM | POA: Diagnosis not present

## 2023-12-25 DIAGNOSIS — Z96652 Presence of left artificial knee joint: Secondary | ICD-10-CM | POA: Diagnosis not present

## 2023-12-26 DIAGNOSIS — M4325 Fusion of spine, thoracolumbar region: Secondary | ICD-10-CM | POA: Diagnosis not present

## 2023-12-26 DIAGNOSIS — G35 Multiple sclerosis: Secondary | ICD-10-CM | POA: Diagnosis not present

## 2023-12-26 DIAGNOSIS — M4326 Fusion of spine, lumbar region: Secondary | ICD-10-CM | POA: Diagnosis not present

## 2023-12-31 DIAGNOSIS — M4326 Fusion of spine, lumbar region: Secondary | ICD-10-CM | POA: Diagnosis not present

## 2023-12-31 DIAGNOSIS — G35 Multiple sclerosis: Secondary | ICD-10-CM | POA: Diagnosis not present

## 2023-12-31 DIAGNOSIS — M4325 Fusion of spine, thoracolumbar region: Secondary | ICD-10-CM | POA: Diagnosis not present

## 2024-01-01 DIAGNOSIS — M4325 Fusion of spine, thoracolumbar region: Secondary | ICD-10-CM | POA: Diagnosis not present

## 2024-01-01 DIAGNOSIS — M4326 Fusion of spine, lumbar region: Secondary | ICD-10-CM | POA: Diagnosis not present

## 2024-01-01 DIAGNOSIS — G35 Multiple sclerosis: Secondary | ICD-10-CM | POA: Diagnosis not present

## 2024-01-04 DIAGNOSIS — J9611 Chronic respiratory failure with hypoxia: Secondary | ICD-10-CM | POA: Diagnosis not present

## 2024-01-05 DIAGNOSIS — G4733 Obstructive sleep apnea (adult) (pediatric): Secondary | ICD-10-CM | POA: Diagnosis not present

## 2024-01-06 DIAGNOSIS — G35 Multiple sclerosis: Secondary | ICD-10-CM | POA: Diagnosis not present

## 2024-01-06 DIAGNOSIS — M4325 Fusion of spine, thoracolumbar region: Secondary | ICD-10-CM | POA: Diagnosis not present

## 2024-01-06 DIAGNOSIS — M4326 Fusion of spine, lumbar region: Secondary | ICD-10-CM | POA: Diagnosis not present

## 2024-01-09 DIAGNOSIS — M4326 Fusion of spine, lumbar region: Secondary | ICD-10-CM | POA: Diagnosis not present

## 2024-01-09 DIAGNOSIS — G35 Multiple sclerosis: Secondary | ICD-10-CM | POA: Diagnosis not present

## 2024-01-09 DIAGNOSIS — M4325 Fusion of spine, thoracolumbar region: Secondary | ICD-10-CM | POA: Diagnosis not present

## 2024-01-13 DIAGNOSIS — M4326 Fusion of spine, lumbar region: Secondary | ICD-10-CM | POA: Diagnosis not present

## 2024-01-13 DIAGNOSIS — M4325 Fusion of spine, thoracolumbar region: Secondary | ICD-10-CM | POA: Diagnosis not present

## 2024-01-13 DIAGNOSIS — G35 Multiple sclerosis: Secondary | ICD-10-CM | POA: Diagnosis not present

## 2024-01-15 DIAGNOSIS — G35 Multiple sclerosis: Secondary | ICD-10-CM | POA: Diagnosis not present

## 2024-01-15 DIAGNOSIS — M4326 Fusion of spine, lumbar region: Secondary | ICD-10-CM | POA: Diagnosis not present

## 2024-01-15 DIAGNOSIS — M4325 Fusion of spine, thoracolumbar region: Secondary | ICD-10-CM | POA: Diagnosis not present

## 2024-01-19 ENCOUNTER — Other Ambulatory Visit (HOSPITAL_COMMUNITY): Payer: Self-pay

## 2024-01-19 ENCOUNTER — Other Ambulatory Visit: Payer: Self-pay

## 2024-01-21 DIAGNOSIS — M4325 Fusion of spine, thoracolumbar region: Secondary | ICD-10-CM | POA: Diagnosis not present

## 2024-01-21 DIAGNOSIS — G35 Multiple sclerosis: Secondary | ICD-10-CM | POA: Diagnosis not present

## 2024-01-21 DIAGNOSIS — M4326 Fusion of spine, lumbar region: Secondary | ICD-10-CM | POA: Diagnosis not present

## 2024-01-27 DIAGNOSIS — D485 Neoplasm of uncertain behavior of skin: Secondary | ICD-10-CM | POA: Diagnosis not present

## 2024-01-27 DIAGNOSIS — D225 Melanocytic nevi of trunk: Secondary | ICD-10-CM | POA: Diagnosis not present

## 2024-01-27 DIAGNOSIS — L578 Other skin changes due to chronic exposure to nonionizing radiation: Secondary | ICD-10-CM | POA: Diagnosis not present

## 2024-01-27 DIAGNOSIS — L821 Other seborrheic keratosis: Secondary | ICD-10-CM | POA: Diagnosis not present

## 2024-01-27 DIAGNOSIS — L814 Other melanin hyperpigmentation: Secondary | ICD-10-CM | POA: Diagnosis not present

## 2024-01-28 DIAGNOSIS — M4326 Fusion of spine, lumbar region: Secondary | ICD-10-CM | POA: Diagnosis not present

## 2024-01-28 DIAGNOSIS — M4325 Fusion of spine, thoracolumbar region: Secondary | ICD-10-CM | POA: Diagnosis not present

## 2024-01-28 DIAGNOSIS — G35 Multiple sclerosis: Secondary | ICD-10-CM | POA: Diagnosis not present

## 2024-02-02 DIAGNOSIS — Z5181 Encounter for therapeutic drug level monitoring: Secondary | ICD-10-CM | POA: Diagnosis not present

## 2024-02-02 DIAGNOSIS — R5383 Other fatigue: Secondary | ICD-10-CM | POA: Diagnosis not present

## 2024-02-02 DIAGNOSIS — G629 Polyneuropathy, unspecified: Secondary | ICD-10-CM | POA: Diagnosis not present

## 2024-02-02 DIAGNOSIS — G35 Multiple sclerosis: Secondary | ICD-10-CM | POA: Diagnosis not present

## 2024-02-03 DIAGNOSIS — M4326 Fusion of spine, lumbar region: Secondary | ICD-10-CM | POA: Diagnosis not present

## 2024-02-03 DIAGNOSIS — M4325 Fusion of spine, thoracolumbar region: Secondary | ICD-10-CM | POA: Diagnosis not present

## 2024-02-03 DIAGNOSIS — J9611 Chronic respiratory failure with hypoxia: Secondary | ICD-10-CM | POA: Diagnosis not present

## 2024-02-03 DIAGNOSIS — G35 Multiple sclerosis: Secondary | ICD-10-CM | POA: Diagnosis not present

## 2024-02-05 DIAGNOSIS — M4325 Fusion of spine, thoracolumbar region: Secondary | ICD-10-CM | POA: Diagnosis not present

## 2024-02-05 DIAGNOSIS — G35 Multiple sclerosis: Secondary | ICD-10-CM | POA: Diagnosis not present

## 2024-02-05 DIAGNOSIS — M4326 Fusion of spine, lumbar region: Secondary | ICD-10-CM | POA: Diagnosis not present

## 2024-02-06 ENCOUNTER — Inpatient Hospital Stay: Admission: RE | Admit: 2024-02-06 | Source: Ambulatory Visit

## 2024-02-10 ENCOUNTER — Other Ambulatory Visit (HOSPITAL_COMMUNITY): Payer: Self-pay

## 2024-02-10 DIAGNOSIS — M4316 Spondylolisthesis, lumbar region: Secondary | ICD-10-CM | POA: Diagnosis not present

## 2024-02-10 MED ORDER — MORPHINE SULFATE ER 60 MG PO TBCR
60.0000 mg | EXTENDED_RELEASE_TABLET | Freq: Two times a day (BID) | ORAL | 0 refills | Status: DC
  Filled 2024-02-10 – 2024-02-16 (×3): qty 60, 30d supply, fill #0

## 2024-02-10 MED ORDER — HYDROCODONE-ACETAMINOPHEN 10-325 MG PO TABS
1.0000 | ORAL_TABLET | ORAL | 0 refills | Status: DC | PRN
Start: 2024-02-10 — End: 2024-02-16
  Filled 2024-02-10 – 2024-02-13 (×2): qty 150, 32d supply, fill #0
  Filled 2024-02-16: qty 150, 30d supply, fill #0

## 2024-02-10 MED ORDER — MORPHINE SULFATE ER 60 MG PO TBCR
60.0000 mg | EXTENDED_RELEASE_TABLET | Freq: Two times a day (BID) | ORAL | 0 refills | Status: DC
Start: 2024-02-10 — End: 2024-03-24
  Filled 2024-02-10: qty 60, 30d supply, fill #0

## 2024-02-10 MED ORDER — HYDROCODONE-ACETAMINOPHEN 10-325 MG PO TABS
1.0000 | ORAL_TABLET | ORAL | 0 refills | Status: DC | PRN
  Filled 2024-02-10: qty 150, 32d supply, fill #0
  Filled 2024-03-16: qty 150, 30d supply, fill #0

## 2024-02-10 MED ORDER — HYDROCODONE-ACETAMINOPHEN 10-325 MG PO TABS
1.0000 | ORAL_TABLET | ORAL | 0 refills | Status: DC | PRN
Start: 2024-02-10 — End: 2024-04-22
  Filled 2024-02-10: qty 150, 32d supply, fill #0
  Filled 2024-04-14: qty 150, 30d supply, fill #0

## 2024-02-10 MED ORDER — MORPHINE SULFATE ER 60 MG PO TBCR
60.0000 mg | EXTENDED_RELEASE_TABLET | Freq: Two times a day (BID) | ORAL | 0 refills | Status: DC
  Filled 2024-02-10 – 2024-03-23 (×3): qty 60, 30d supply, fill #0

## 2024-02-12 ENCOUNTER — Ambulatory Visit
Admission: RE | Admit: 2024-02-12 | Discharge: 2024-02-12 | Disposition: A | Discharge status: Home / Self Care | Source: Ambulatory Visit | Attending: Cardiology | Admitting: Cardiology

## 2024-02-12 DIAGNOSIS — M4316 Spondylolisthesis, lumbar region: Secondary | ICD-10-CM | POA: Diagnosis not present

## 2024-02-12 DIAGNOSIS — R918 Other nonspecific abnormal finding of lung field: Secondary | ICD-10-CM | POA: Diagnosis not present

## 2024-02-12 DIAGNOSIS — I7121 Aneurysm of the ascending aorta, without rupture: Secondary | ICD-10-CM | POA: Diagnosis not present

## 2024-02-12 DIAGNOSIS — I712 Thoracic aortic aneurysm, without rupture, unspecified: Secondary | ICD-10-CM

## 2024-02-12 DIAGNOSIS — I7123 Aneurysm of the descending thoracic aorta, without rupture: Secondary | ICD-10-CM

## 2024-02-12 MED ORDER — IOPAMIDOL (ISOVUE-370) INJECTION 76%
75.0000 mL | Freq: Once | INTRAVENOUS | Status: AC | PRN
  Administered 2024-02-12: 75 mL via INTRAVENOUS

## 2024-02-13 ENCOUNTER — Other Ambulatory Visit: Payer: Self-pay | Admitting: Family Medicine

## 2024-02-13 ENCOUNTER — Other Ambulatory Visit (HOSPITAL_COMMUNITY): Payer: Self-pay

## 2024-02-13 DIAGNOSIS — I10 Essential (primary) hypertension: Secondary | ICD-10-CM

## 2024-02-16 ENCOUNTER — Ambulatory Visit: Payer: Self-pay | Admitting: Cardiology

## 2024-02-16 ENCOUNTER — Other Ambulatory Visit (HOSPITAL_COMMUNITY): Payer: Self-pay

## 2024-02-16 ENCOUNTER — Other Ambulatory Visit: Payer: Self-pay

## 2024-02-16 DIAGNOSIS — I712 Thoracic aortic aneurysm, without rupture, unspecified: Secondary | ICD-10-CM

## 2024-02-16 DIAGNOSIS — I7123 Aneurysm of the descending thoracic aorta, without rupture: Secondary | ICD-10-CM

## 2024-02-26 DIAGNOSIS — G35 Multiple sclerosis: Secondary | ICD-10-CM | POA: Diagnosis not present

## 2024-02-26 DIAGNOSIS — M4326 Fusion of spine, lumbar region: Secondary | ICD-10-CM | POA: Diagnosis not present

## 2024-02-26 DIAGNOSIS — M4325 Fusion of spine, thoracolumbar region: Secondary | ICD-10-CM | POA: Diagnosis not present

## 2024-03-02 DIAGNOSIS — M4326 Fusion of spine, lumbar region: Secondary | ICD-10-CM | POA: Diagnosis not present

## 2024-03-02 DIAGNOSIS — M4325 Fusion of spine, thoracolumbar region: Secondary | ICD-10-CM | POA: Diagnosis not present

## 2024-03-02 DIAGNOSIS — G35 Multiple sclerosis: Secondary | ICD-10-CM | POA: Diagnosis not present

## 2024-03-03 ENCOUNTER — Other Ambulatory Visit: Payer: Self-pay | Admitting: Nurse Practitioner

## 2024-03-03 DIAGNOSIS — K219 Gastro-esophageal reflux disease without esophagitis: Secondary | ICD-10-CM

## 2024-03-03 NOTE — Telephone Encounter (Signed)
 Copied from CRM [id number]. Topic: Clinical - Medication Refill >> Mar 03, 2024  9:07 AM Russell PARAS wrote: Medication: famotidine  (PEPCID ) 20 MG tablet   Has the patient contacted their pharmacy? Yes (Agent: If no, request that the patient contact the pharmacy for the refill. If patient does not wish to contact the pharmacy document the reason why and proceed with request.) (Agent: If yes, when and what did the pharmacy advise?)  This is the patient's preferred pharmacy:  [facility] [place], [place] - [address] [place] [address] Phone: [phone] Fax: [phone]  Is this the correct pharmacy for this prescription? Yes If no, delete pharmacy and type the correct one.   Has the prescription been filled recently? No  Is the patient out of the medication? Yes  Has the patient been seen for an appointment in the last year OR does the patient have an upcoming appointment? Yes  Can we respond through MyChart? Yes  Agent: Please be advised that Rx refills may take up to 3 business days. We ask that you follow-up with your pharmacy.

## 2024-03-04 MED ORDER — FAMOTIDINE 20 MG PO TABS: 20.0000 mg | ORAL_TABLET | Freq: Two times a day (BID) | ORAL | 3 refills | Status: DC

## 2024-03-05 DIAGNOSIS — J9611 Chronic respiratory failure with hypoxia: Secondary | ICD-10-CM | POA: Diagnosis not present

## 2024-03-09 DIAGNOSIS — M4326 Fusion of spine, lumbar region: Secondary | ICD-10-CM | POA: Diagnosis not present

## 2024-03-09 DIAGNOSIS — M4325 Fusion of spine, thoracolumbar region: Secondary | ICD-10-CM | POA: Diagnosis not present

## 2024-03-09 DIAGNOSIS — G35 Multiple sclerosis: Secondary | ICD-10-CM | POA: Diagnosis not present

## 2024-03-11 DIAGNOSIS — M4325 Fusion of spine, thoracolumbar region: Secondary | ICD-10-CM | POA: Diagnosis not present

## 2024-03-11 DIAGNOSIS — M4326 Fusion of spine, lumbar region: Secondary | ICD-10-CM | POA: Diagnosis not present

## 2024-03-11 DIAGNOSIS — G35 Multiple sclerosis: Secondary | ICD-10-CM | POA: Diagnosis not present

## 2024-03-16 ENCOUNTER — Other Ambulatory Visit (HOSPITAL_COMMUNITY): Payer: Self-pay

## 2024-03-16 DIAGNOSIS — G35 Multiple sclerosis: Secondary | ICD-10-CM | POA: Diagnosis not present

## 2024-03-16 DIAGNOSIS — M4325 Fusion of spine, thoracolumbar region: Secondary | ICD-10-CM | POA: Diagnosis not present

## 2024-03-16 DIAGNOSIS — M4326 Fusion of spine, lumbar region: Secondary | ICD-10-CM | POA: Diagnosis not present

## 2024-03-17 ENCOUNTER — Other Ambulatory Visit (HOSPITAL_COMMUNITY): Payer: Self-pay

## 2024-03-18 DIAGNOSIS — M4326 Fusion of spine, lumbar region: Secondary | ICD-10-CM | POA: Diagnosis not present

## 2024-03-18 DIAGNOSIS — M4325 Fusion of spine, thoracolumbar region: Secondary | ICD-10-CM | POA: Diagnosis not present

## 2024-03-18 DIAGNOSIS — G35 Multiple sclerosis: Secondary | ICD-10-CM | POA: Diagnosis not present

## 2024-03-23 ENCOUNTER — Other Ambulatory Visit (HOSPITAL_COMMUNITY): Payer: Self-pay

## 2024-03-23 ENCOUNTER — Other Ambulatory Visit: Payer: Self-pay | Admitting: Family Medicine

## 2024-03-23 DIAGNOSIS — L299 Pruritus, unspecified: Secondary | ICD-10-CM

## 2024-03-24 ENCOUNTER — Other Ambulatory Visit (HOSPITAL_COMMUNITY): Payer: Self-pay

## 2024-03-24 DIAGNOSIS — Z125 Encounter for screening for malignant neoplasm of prostate: Secondary | ICD-10-CM | POA: Diagnosis not present

## 2024-03-24 MED ORDER — MORPHINE SULFATE ER 60 MG PO TBCR
60.0000 mg | EXTENDED_RELEASE_TABLET | Freq: Two times a day (BID) | ORAL | 0 refills | Status: DC
  Filled 2024-03-24: qty 60, 30d supply, fill #0

## 2024-03-30 DIAGNOSIS — M4325 Fusion of spine, thoracolumbar region: Secondary | ICD-10-CM | POA: Diagnosis not present

## 2024-03-30 DIAGNOSIS — M4326 Fusion of spine, lumbar region: Secondary | ICD-10-CM | POA: Diagnosis not present

## 2024-03-30 DIAGNOSIS — G35 Multiple sclerosis: Secondary | ICD-10-CM | POA: Diagnosis not present

## 2024-04-01 DIAGNOSIS — M4326 Fusion of spine, lumbar region: Secondary | ICD-10-CM | POA: Diagnosis not present

## 2024-04-01 DIAGNOSIS — M4325 Fusion of spine, thoracolumbar region: Secondary | ICD-10-CM | POA: Diagnosis not present

## 2024-04-01 DIAGNOSIS — G35 Multiple sclerosis: Secondary | ICD-10-CM | POA: Diagnosis not present

## 2024-04-05 ENCOUNTER — Ambulatory Visit (INDEPENDENT_AMBULATORY_CARE_PROVIDER_SITE_OTHER): Admitting: Family Medicine

## 2024-04-05 ENCOUNTER — Encounter: Payer: Self-pay | Admitting: Family Medicine

## 2024-04-05 VITALS — BP 119/76 | HR 69 | Ht 73.0 in | Wt 288.0 lb

## 2024-04-05 DIAGNOSIS — J9611 Chronic respiratory failure with hypoxia: Secondary | ICD-10-CM | POA: Diagnosis not present

## 2024-04-05 DIAGNOSIS — R197 Diarrhea, unspecified: Secondary | ICD-10-CM

## 2024-04-05 DIAGNOSIS — G35 Multiple sclerosis: Secondary | ICD-10-CM

## 2024-04-05 NOTE — Assessment & Plan Note (Signed)
 Follows with Neurology. Legs sxs are progressing. follows with Neurology

## 2024-04-05 NOTE — Progress Notes (Signed)
 Acute Office Visit  Subjective:     Patient ID: Mark DELENA Rogena Mickey., male    DOB: 02-Jun-1961, 63 y.o.   MRN: [id number]  Chief Complaint  Patient presents with   Medical Management of Chronic Issues    HPI Patient is in today for diarrhea  Last infusion 7/14, about 2 weeks later had diarrhea and then on and off since then. Had fever, sweat in mid-August.    Discussed the use of AI scribe software for clinical note transcription with the patient, who gave verbal consent to proceed.  Several updates to med list.    History of Present Illness Mark DELENA Rogena Mickey. Mark [name] is a 63 year old male with multiple sclerosis who presents with persistent diarrhea following Ocrevus  infusion.  Diarrhea and gastrointestinal symptoms - Persistent watery diarrhea since approximately two weeks after second full-strength Ocrevus  infusion on February 02, 2024 - Stool is yellowish, mucousy, and without blood - Episodes of cramping and abdominal soreness, particularly under pressure - Instances of fecal incontinence - Intensity of symptoms varies, with some days more severe than others - No known food triggers; fear of eating due to symptoms - Intermittent fevers - No recent antibiotic or steroid use prior to onset of diarrhea - Weight loss; current weight is 288 pounds, down 6 lbs.    Musculoskeletal and neurological symptoms - Significant weakness in legs, making it difficult to stand - Joint pain - History of multiple sclerosis, associated with sensations of itching on head and face  Urinary symptoms - Increased urination on days with more severe diarrhea - No dysuria - Upcoming urology appointment  Constitutional and other symptoms - No rashes observed - No recent travel outside local area except for a trip to the beach, remained indoors  Laboratory findings - Decrease in lymphocyte count following Ocrevus  infusions     ROS      Objective:    BP 119/76   Pulse 69   Ht  6' 1 (1.854 m)   Wt 288 lb 0.6 oz (130.7 kg)   SpO2 97%   BMI 38.00 kg/m    Physical Exam Vitals and nursing note reviewed.  Constitutional:      Appearance: Normal appearance.  HENT:     Head: Normocephalic and atraumatic.  Eyes:     Conjunctiva/sclera: Conjunctivae normal.  Cardiovascular:     Rate and Rhythm: Normal rate and regular rhythm.  Pulmonary:     Effort: Pulmonary effort is normal.     Breath sounds: Normal breath sounds.  Abdominal:     General: Bowel sounds are normal. There is no distension.     Palpations: Abdomen is soft.  Skin:    General: Skin is warm and dry.  Neurological:     Mental Status: He is alert.  Psychiatric:        Mood and Affect: Mood normal.     No results found for any visits on 04/05/24.      Assessment & Plan:   Problem List Items Addressed This Visit       Nervous and Auditory   MS (multiple sclerosis) (HCC)   Follows with Neurology. Legs sxs are progressing. follows with Neurology       Other Visit Diagnoses       Diarrhea, unspecified type    -  Primary   Relevant Orders   CBC with Differential/Platelet   CMP14+EGFR   Sedimentation rate   C-reactive protein   TSH   GI Profile, Stool,  PCR      Assessment and Plan Assessment & Plan Chronic watery diarrhea Chronic diarrhea post-Ocrevus  infusion with symptoms of yellowish, mucousy diarrhea, fever, cramping, and urgency. Differential includes infectious and autoimmune causes. Low lymphocyte count may impair infection recovery. - Order CBC, inflammatory markers, and metabolic panel. - Order GI pathogen test with stool specimen for E. coli, Salmonella, Shigella, C. diff. - Advise Pepto Bismol as needed for symptom relief. - Instruct to provide a loose stool sample for testing. - Discussed potential medication-related cause and future Ocrevus  implications.    No orders of the defined types were placed in this encounter.   Return if symptoms worsen or fail to  improve.  Dorothyann Byars, MD

## 2024-04-06 DIAGNOSIS — R197 Diarrhea, unspecified: Secondary | ICD-10-CM | POA: Diagnosis not present

## 2024-04-06 DIAGNOSIS — G35 Multiple sclerosis: Secondary | ICD-10-CM | POA: Diagnosis not present

## 2024-04-06 DIAGNOSIS — R3912 Poor urinary stream: Secondary | ICD-10-CM | POA: Diagnosis not present

## 2024-04-06 DIAGNOSIS — N401 Enlarged prostate with lower urinary tract symptoms: Secondary | ICD-10-CM | POA: Diagnosis not present

## 2024-04-06 DIAGNOSIS — M4326 Fusion of spine, lumbar region: Secondary | ICD-10-CM | POA: Diagnosis not present

## 2024-04-06 DIAGNOSIS — M4325 Fusion of spine, thoracolumbar region: Secondary | ICD-10-CM | POA: Diagnosis not present

## 2024-04-06 DIAGNOSIS — N319 Neuromuscular dysfunction of bladder, unspecified: Secondary | ICD-10-CM | POA: Diagnosis not present

## 2024-04-06 DIAGNOSIS — N3944 Nocturnal enuresis: Secondary | ICD-10-CM | POA: Diagnosis not present

## 2024-04-06 LAB — CBC WITH DIFFERENTIAL/PLATELET
Basophils Absolute: 0.1 x10E3/uL (ref 0.0–0.2)
Basos: 1 %
EOS (ABSOLUTE): 0.2 x10E3/uL (ref 0.0–0.4)
Eos: 3 %
Hematocrit: 40.8 % (ref 37.5–51.0)
Hemoglobin: 13 g/dL (ref 13.0–17.7)
Immature Grans (Abs): 0 x10E3/uL (ref 0.0–0.1)
Immature Granulocytes: 0 %
Lymphocytes Absolute: 1.5 x10E3/uL (ref 0.7–3.1)
Lymphs: 23 %
MCH: 28.4 pg (ref 26.6–33.0)
MCHC: 31.9 g/dL (ref 31.5–35.7)
MCV: 89 fL (ref 79–97)
Monocytes Absolute: 0.5 x10E3/uL (ref 0.1–0.9)
Monocytes: 7 %
Neutrophils Absolute: 4.3 x10E3/uL (ref 1.4–7.0)
Neutrophils: 65 %
Platelets: 306 x10E3/uL (ref 150–450)
RBC: 4.57 x10E6/uL (ref 4.14–5.80)
RDW: 12.3 % (ref 11.6–15.4)
WBC: 6.6 x10E3/uL (ref 3.4–10.8)

## 2024-04-06 LAB — CMP14+EGFR
ALT: 12 IU/L (ref 0–44)
AST: 16 IU/L (ref 0–40)
Albumin: 4.3 g/dL (ref 3.9–4.9)
Alkaline Phosphatase: 135 IU/L — ABNORMAL HIGH (ref 47–123)
BUN/Creatinine Ratio: 22 (ref 10–24)
BUN: 21 mg/dL (ref 8–27)
Bilirubin Total: 0.4 mg/dL (ref 0.0–1.2)
CO2: 27 mmol/L (ref 20–29)
Calcium: 9.8 mg/dL (ref 8.6–10.2)
Chloride: 96 mmol/L (ref 96–106)
Creatinine, Ser: 0.94 mg/dL (ref 0.76–1.27)
Globulin, Total: 2 g/dL (ref 1.5–4.5)
Glucose: 90 mg/dL (ref 70–99)
Potassium: 4.1 mmol/L (ref 3.5–5.2)
Sodium: 137 mmol/L (ref 134–144)
Total Protein: 6.3 g/dL (ref 6.0–8.5)
eGFR: 92 mL/min/1.73 (ref >59)

## 2024-04-06 LAB — TSH: TSH: 0.709 u[IU]/mL (ref 0.450–4.500)

## 2024-04-06 LAB — SEDIMENTATION RATE: Sed Rate: 23 mm/h (ref 0–30)

## 2024-04-06 LAB — C-REACTIVE PROTEIN: CRP: 11 mg/L — ABNORMAL HIGH (ref 0–10)

## 2024-04-07 ENCOUNTER — Ambulatory Visit: Payer: Self-pay | Admitting: Family Medicine

## 2024-04-07 DIAGNOSIS — A0472 Enterocolitis due to Clostridium difficile, not specified as recurrent: Secondary | ICD-10-CM

## 2024-04-07 LAB — GI PROFILE, STOOL, PCR
Adenovirus F 40/41: NOT DETECTED
Astrovirus: NOT DETECTED
C difficile toxin A/B: DETECTED — AB
Campylobacter: NOT DETECTED
Cryptosporidium: NOT DETECTED
Cyclospora cayetanensis: NOT DETECTED
Entamoeba histolytica: NOT DETECTED
Enteroaggregative E coli: NOT DETECTED
Enteropathogenic E coli: DETECTED — AB
Enterotoxigenic E coli: NOT DETECTED
Giardia lamblia: NOT DETECTED
Norovirus GI/GII: NOT DETECTED
Plesiomonas shigelloides: NOT DETECTED
Rotavirus A: NOT DETECTED
Salmonella: NOT DETECTED
Sapovirus: NOT DETECTED
Shiga-toxin-producing E coli: NOT DETECTED
Shigella/Enteroinvasive E coli: NOT DETECTED
Vibrio cholerae: NOT DETECTED
Vibrio: NOT DETECTED
Yersinia enterocolitica: NOT DETECTED

## 2024-04-07 LAB — SPECIMEN STATUS REPORT

## 2024-04-07 NOTE — Progress Notes (Signed)
 Hi Mark [name], electrolytes look good this time.  Alkaline phosphatase which is a liver enzyme is up just slightly I do want to keep an eye on this as it has been normal in the past.  So lets plan to recheck again in about 8 weeks.  The other liver enzymes, AST and ALT actually look okay.  Is also reassuring with the amount of diarrhea that you have been having.  Your hemoglobin actually looks pretty good at 13 no elevated white blood cell count which is also reassuring.  One of your inflammatory markers looks good the other ones just slightly elevated.  Thyroid  level looks great so it is not off.  So at this point we will just await the results on the GI stool profile.

## 2024-04-08 ENCOUNTER — Telehealth: Payer: Self-pay

## 2024-04-08 MED ORDER — VANCOMYCIN HCL 125 MG PO CAPS: 125.0000 mg | ORAL_CAPSULE | Freq: Four times a day (QID) | ORAL | 0 refills | Status: AC

## 2024-04-08 MED ORDER — FIDAXOMICIN 200 MG PO TABS
200.0000 mg | ORAL_TABLET | Freq: Two times a day (BID) | ORAL | 0 refills | Status: DC
Start: 2024-04-08 — End: 2024-04-08

## 2024-04-08 NOTE — Telephone Encounter (Signed)
 Spoke with patient wife and forwarded a message to Dr. Alvan regarding concerns over cost of medication prescribed for C. Diff

## 2024-04-08 NOTE — Progress Notes (Signed)
 Hi Mark [name], Your stool sample came back showing you have C diff and enteropathogenic e coli.  I will send over medication to treat this.

## 2024-04-08 NOTE — Telephone Encounter (Signed)
 Message forwarded to provider in separate message previously  This is duplicate message

## 2024-04-08 NOTE — Telephone Encounter (Signed)
 Copied from CRM [id number]. Topic: Clinical - Lab/Test Results >> Apr 07, 2024  3:49 PM Carrielelia G wrote: Reason for CRM: Mrs. Rodick calling and would like to talk with Dr Alvan regarding pt Fionn's lab results.  Please advise

## 2024-04-08 NOTE — Telephone Encounter (Signed)
 Received notification from patient that the Dificid  prescription was too costly.  They are requesting alternate prescription be sent in.  Please notiefy family has been sent in     Meds ordered this encounter  Medications   DISCONTD: fidaxomicin  (DIFICID ) 200 MG TABS tablet    Sig: Take 1 tablet (200 mg total) by mouth 2 (two) times daily with a meal.    Dispense:  20 tablet    Refill:  0   vancomycin  (VANCOCIN ) 125 MG capsule    Sig: Take 1 capsule (125 mg total) by mouth 4 (four) times daily for 10 days.    Dispense:  40 capsule    Refill:  0

## 2024-04-08 NOTE — Telephone Encounter (Signed)
 Copied from CRM [id number]. Topic: Clinical - Prescription Issue >> Apr 08, 2024  9:35 AM Marda G wrote: Reason for CRM: Medication too expensive fidaxomicin  (DIFICID ) 200 MG TABS tablet  Cost: $600.00.   Wife calling for an alternative.   Please advise >> Apr 08, 2024  9:43 AM Diannia H wrote: Patients wife is wanting a callback at [phone]

## 2024-04-13 DIAGNOSIS — M4326 Fusion of spine, lumbar region: Secondary | ICD-10-CM | POA: Diagnosis not present

## 2024-04-13 DIAGNOSIS — G35 Multiple sclerosis: Secondary | ICD-10-CM | POA: Diagnosis not present

## 2024-04-13 DIAGNOSIS — M4325 Fusion of spine, thoracolumbar region: Secondary | ICD-10-CM | POA: Diagnosis not present

## 2024-04-14 ENCOUNTER — Other Ambulatory Visit (HOSPITAL_COMMUNITY): Payer: Self-pay

## 2024-04-15 DIAGNOSIS — M4326 Fusion of spine, lumbar region: Secondary | ICD-10-CM | POA: Diagnosis not present

## 2024-04-15 DIAGNOSIS — M4325 Fusion of spine, thoracolumbar region: Secondary | ICD-10-CM | POA: Diagnosis not present

## 2024-04-15 DIAGNOSIS — G35 Multiple sclerosis: Secondary | ICD-10-CM | POA: Diagnosis not present

## 2024-04-19 ENCOUNTER — Other Ambulatory Visit: Payer: Self-pay | Admitting: Family Medicine

## 2024-04-19 DIAGNOSIS — R600 Localized edema: Secondary | ICD-10-CM

## 2024-04-20 DIAGNOSIS — M4326 Fusion of spine, lumbar region: Secondary | ICD-10-CM | POA: Diagnosis not present

## 2024-04-20 DIAGNOSIS — M4325 Fusion of spine, thoracolumbar region: Secondary | ICD-10-CM | POA: Diagnosis not present

## 2024-04-20 DIAGNOSIS — G35 Multiple sclerosis: Secondary | ICD-10-CM | POA: Diagnosis not present

## 2024-04-22 ENCOUNTER — Other Ambulatory Visit (HOSPITAL_COMMUNITY): Payer: Self-pay

## 2024-04-22 ENCOUNTER — Other Ambulatory Visit: Payer: Self-pay | Admitting: Family Medicine

## 2024-04-22 DIAGNOSIS — R609 Edema, unspecified: Secondary | ICD-10-CM

## 2024-04-22 DIAGNOSIS — M4326 Fusion of spine, lumbar region: Secondary | ICD-10-CM | POA: Diagnosis not present

## 2024-04-22 DIAGNOSIS — M4325 Fusion of spine, thoracolumbar region: Secondary | ICD-10-CM | POA: Diagnosis not present

## 2024-04-22 MED ORDER — MORPHINE SULFATE ER 30 MG PO TBCR
60.0000 mg | EXTENDED_RELEASE_TABLET | Freq: Two times a day (BID) | ORAL | 0 refills | Status: DC
  Filled 2024-04-22: qty 120, 30d supply, fill #0

## 2024-04-22 MED ORDER — MORPHINE SULFATE ER 60 MG PO TBCR
60.0000 mg | EXTENDED_RELEASE_TABLET | Freq: Two times a day (BID) | ORAL | 0 refills | Status: DC
  Filled 2024-04-22: qty 60, 30d supply, fill #0

## 2024-05-04 ENCOUNTER — Other Ambulatory Visit (HOSPITAL_COMMUNITY): Payer: Self-pay

## 2024-05-04 DIAGNOSIS — G35D Multiple sclerosis, unspecified: Secondary | ICD-10-CM | POA: Diagnosis not present

## 2024-05-04 DIAGNOSIS — F1120 Opioid dependence, uncomplicated: Secondary | ICD-10-CM | POA: Diagnosis not present

## 2024-05-04 DIAGNOSIS — M5416 Radiculopathy, lumbar region: Secondary | ICD-10-CM | POA: Diagnosis not present

## 2024-05-04 DIAGNOSIS — S32009K Unspecified fracture of unspecified lumbar vertebra, subsequent encounter for fracture with nonunion: Secondary | ICD-10-CM | POA: Diagnosis not present

## 2024-05-04 MED ORDER — MORPHINE SULFATE ER 60 MG PO TBCR
60.0000 mg | EXTENDED_RELEASE_TABLET | Freq: Two times a day (BID) | ORAL | 0 refills | Status: AC | PRN
  Filled 2024-06-21 – 2024-07-21 (×3): qty 60, 30d supply, fill #0

## 2024-05-04 MED ORDER — HYDROCODONE-ACETAMINOPHEN 10-325 MG PO TABS
1.0000 | ORAL_TABLET | ORAL | 0 refills | Status: DC | PRN
  Filled 2024-06-14: qty 150, 30d supply, fill #0

## 2024-05-04 MED ORDER — HYDROCODONE-ACETAMINOPHEN 10-325 MG PO TABS
1.0000 | ORAL_TABLET | ORAL | 0 refills | Status: DC | PRN
  Filled 2024-07-13: qty 150, 30d supply, fill #0

## 2024-05-04 MED ORDER — HYDROCODONE-ACETAMINOPHEN 10-325 MG PO TABS
1.0000 | ORAL_TABLET | ORAL | 0 refills | Status: DC | PRN
  Filled 2024-05-13: qty 150, 30d supply, fill #0

## 2024-05-04 MED ORDER — MORPHINE SULFATE ER 60 MG PO TBCR
60.0000 mg | EXTENDED_RELEASE_TABLET | Freq: Two times a day (BID) | ORAL | 0 refills | Status: DC
  Filled 2024-05-04 – 2024-06-21 (×2): qty 60, 30d supply, fill #0

## 2024-05-04 MED ORDER — MORPHINE SULFATE ER 60 MG PO TBCR
60.0000 mg | EXTENDED_RELEASE_TABLET | Freq: Two times a day (BID) | ORAL | 0 refills | Status: DC | PRN
  Filled 2024-05-22: qty 60, 30d supply, fill #0

## 2024-05-05 ENCOUNTER — Encounter: Payer: Self-pay | Admitting: Neurosurgery

## 2024-05-05 ENCOUNTER — Other Ambulatory Visit: Payer: Self-pay | Admitting: Neurosurgery

## 2024-05-05 ENCOUNTER — Other Ambulatory Visit (HOSPITAL_COMMUNITY): Payer: Self-pay

## 2024-05-05 DIAGNOSIS — J9611 Chronic respiratory failure with hypoxia: Secondary | ICD-10-CM | POA: Diagnosis not present

## 2024-05-05 DIAGNOSIS — M4316 Spondylolisthesis, lumbar region: Secondary | ICD-10-CM

## 2024-05-06 ENCOUNTER — Ambulatory Visit
Admission: RE | Admit: 2024-05-06 | Discharge: 2024-05-06 | Disposition: A | Discharge status: Home / Self Care | Source: Ambulatory Visit | Attending: Neurosurgery | Admitting: Neurosurgery

## 2024-05-06 DIAGNOSIS — M4316 Spondylolisthesis, lumbar region: Secondary | ICD-10-CM

## 2024-05-13 ENCOUNTER — Other Ambulatory Visit (HOSPITAL_COMMUNITY): Payer: Self-pay

## 2024-05-15 ENCOUNTER — Other Ambulatory Visit (HOSPITAL_COMMUNITY): Payer: Self-pay

## 2024-05-19 ENCOUNTER — Telehealth: Payer: Self-pay | Admitting: Family Medicine

## 2024-05-19 NOTE — Telephone Encounter (Signed)
 Pt's wife advised that he has had a bone density done 1 year ago.

## 2024-05-19 NOTE — Telephone Encounter (Signed)
 Copied from CRM [id number]. Topic: Appointments - Scheduling Inquiry for Clinic >> May 19, 2024  2:54 PM Antwanette L wrote: Reason for CRM: Dwayne, the patient's wife, is calling to schedule a bone density scan for her husband. She prefers an afternoon appointment before June 01, 2024. Dwayne can be contacted at [phone]  Patient needs order for Bone Scan there is not one in the system Please advise

## 2024-05-20 ENCOUNTER — Other Ambulatory Visit (HOSPITAL_COMMUNITY): Payer: Self-pay

## 2024-05-22 ENCOUNTER — Other Ambulatory Visit (HOSPITAL_COMMUNITY): Payer: Self-pay

## 2024-06-01 ENCOUNTER — Other Ambulatory Visit: Payer: Self-pay | Admitting: Nurse Practitioner

## 2024-06-01 DIAGNOSIS — M4316 Spondylolisthesis, lumbar region: Secondary | ICD-10-CM | POA: Diagnosis not present

## 2024-06-01 DIAGNOSIS — S32009K Unspecified fracture of unspecified lumbar vertebra, subsequent encounter for fracture with nonunion: Secondary | ICD-10-CM | POA: Diagnosis not present

## 2024-06-01 DIAGNOSIS — Z6839 Body mass index (BMI) 39.0-39.9, adult: Secondary | ICD-10-CM | POA: Diagnosis not present

## 2024-06-01 DIAGNOSIS — K219 Gastro-esophageal reflux disease without esophagitis: Secondary | ICD-10-CM

## 2024-06-03 DIAGNOSIS — R5383 Other fatigue: Secondary | ICD-10-CM | POA: Diagnosis not present

## 2024-06-03 DIAGNOSIS — M5417 Radiculopathy, lumbosacral region: Secondary | ICD-10-CM | POA: Diagnosis not present

## 2024-06-14 ENCOUNTER — Other Ambulatory Visit (HOSPITAL_COMMUNITY): Payer: Self-pay

## 2024-06-21 ENCOUNTER — Other Ambulatory Visit (HOSPITAL_COMMUNITY): Payer: Self-pay

## 2024-06-25 DIAGNOSIS — G4733 Obstructive sleep apnea (adult) (pediatric): Secondary | ICD-10-CM | POA: Diagnosis not present

## 2024-06-30 DIAGNOSIS — M5417 Radiculopathy, lumbosacral region: Secondary | ICD-10-CM | POA: Diagnosis not present

## 2024-06-30 DIAGNOSIS — G609 Hereditary and idiopathic neuropathy, unspecified: Secondary | ICD-10-CM | POA: Diagnosis not present

## 2024-07-02 ENCOUNTER — Other Ambulatory Visit: Payer: Self-pay | Admitting: Family Medicine

## 2024-07-02 DIAGNOSIS — L299 Pruritus, unspecified: Secondary | ICD-10-CM

## 2024-07-05 DIAGNOSIS — J9611 Chronic respiratory failure with hypoxia: Secondary | ICD-10-CM | POA: Diagnosis not present

## 2024-07-13 ENCOUNTER — Other Ambulatory Visit (HOSPITAL_COMMUNITY): Payer: Self-pay

## 2024-07-20 ENCOUNTER — Other Ambulatory Visit (HOSPITAL_COMMUNITY): Payer: Self-pay

## 2024-07-21 ENCOUNTER — Other Ambulatory Visit: Payer: Self-pay

## 2024-07-21 ENCOUNTER — Other Ambulatory Visit (HOSPITAL_COMMUNITY): Payer: Self-pay

## 2024-07-28 ENCOUNTER — Other Ambulatory Visit: Payer: Self-pay | Admitting: Neurosurgery

## 2024-07-30 ENCOUNTER — Telehealth: Payer: Self-pay | Admitting: Cardiology

## 2024-07-30 NOTE — Telephone Encounter (Signed)
 Pt calling to schedule CT

## 2024-08-03 ENCOUNTER — Ambulatory Visit: Payer: Self-pay | Admitting: Cardiology

## 2024-08-03 ENCOUNTER — Ambulatory Visit (HOSPITAL_COMMUNITY)
Admission: RE | Admit: 2024-08-03 | Discharge: 2024-08-03 | Disposition: A | Discharge status: Home / Self Care | Source: Ambulatory Visit | Attending: Cardiology | Admitting: Cardiology

## 2024-08-03 DIAGNOSIS — I712 Thoracic aortic aneurysm, without rupture, unspecified: Secondary | ICD-10-CM

## 2024-08-03 DIAGNOSIS — I7123 Aneurysm of the descending thoracic aorta, without rupture: Secondary | ICD-10-CM | POA: Insufficient documentation

## 2024-08-03 MED ORDER — IOHEXOL 350 MG/ML SOLN
80.0000 mL | Freq: Once | INTRAVENOUS | Status: AC | PRN
  Administered 2024-08-03: 80 mL via INTRAVENOUS

## 2024-08-05 ENCOUNTER — Other Ambulatory Visit (HOSPITAL_COMMUNITY): Payer: Self-pay

## 2024-08-05 MED ORDER — MORPHINE SULFATE ER 60 MG PO TBCR: 60.0000 mg | EXTENDED_RELEASE_TABLET | Freq: Two times a day (BID) | ORAL | 0 refills | Status: AC

## 2024-08-05 MED ORDER — HYDROCODONE-ACETAMINOPHEN 10-325 MG PO TABS
1.0000 | ORAL_TABLET | ORAL | 0 refills | Status: AC | PRN
  Filled 2024-08-12: qty 150, 30d supply, fill #0
  Filled 2024-08-12: qty 150, 32d supply, fill #0

## 2024-08-05 MED ORDER — MORPHINE SULFATE ER 60 MG PO TBCR
60.0000 mg | EXTENDED_RELEASE_TABLET | Freq: Two times a day (BID) | ORAL | 0 refills | Status: AC
  Filled 2024-08-20: qty 60, 30d supply, fill #0

## 2024-08-07 ENCOUNTER — Other Ambulatory Visit: Payer: Self-pay | Admitting: Family Medicine

## 2024-08-07 DIAGNOSIS — I10 Essential (primary) hypertension: Secondary | ICD-10-CM

## 2024-08-11 NOTE — Pre-Procedure Instructions (Signed)
 Surgical Instructions   Your procedure is scheduled on Friday, January 30th. Report to [facility] Main Entrance A at 09:00 A.M., then check in with the Admitting office. Any questions or running late day of surgery: call [phone]  Questions prior to your surgery date: call [phone], Monday-Friday, 8am-4pm. If you experience any cold or flu symptoms such as cough, fever, chills, shortness of breath, etc. between now and your scheduled surgery, please notify us  at the above number.     Remember:  Do not eat or drink after midnight the night before your surgery    Take these medicines the morning of surgery with A SIP OF WATER  amantadine  (SYMMETREL )  baclofen (LIORESAL)  DULoxetine  (CYMBALTA )  famotidine  (PEPCID )  morphine  (MS CONTIN )  tamsulosin  (FLOMAX )   May take these medicines IF NEEDED: HYDROcodone -acetaminophen  (NORCO)  hydrOXYzine  (VISTARIL )   One week prior to surgery, STOP taking any Aspirin (unless otherwise instructed by your surgeon) Aleve, Naproxen, Ibuprofen , Motrin , Advil , Goody's, BC's, all herbal medications, fish oil, and non-prescription vitamins.                     Do NOT Smoke (Tobacco/Vaping) for 24 hours prior to your procedure.  If you use a CPAP at night, you may bring your mask/headgear for your overnight stay.   You will be asked to remove any contacts, glasses, piercing's, hearing aid's, dentures/partials prior to surgery. Please bring cases for these items if needed.    Your surgeon will determine if you are to be admitted or discharged the same day.  Patients discharged the day of surgery will not be allowed to drive home, and someone needs to stay with them for 24 hours.  SURGICAL WAITING ROOM VISITATION Patients may have no more than 2 support people in the waiting area - these visitors may rotate.   Pre-op nurse will coordinate an appropriate time for 2 ADULT support persons, who may not rotate, to accompany patient in pre-op.   Children under the age of 53 must have an adult with them who is not the patient and must remain in the main waiting area with an adult.  If the patient needs to stay at the hospital during part of their recovery, the visitor guidelines for inpatient rooms apply.  Please refer to the [facility] website for the visitor guidelines for any additional information.   If you received a COVID test during your pre-op visit  it is requested that you wear a mask when out in public, stay away from anyone that may not be feeling well and notify your surgeon if you develop symptoms. If you have been in contact with anyone that has tested positive in the last 10 days please notify you surgeon.      Pre-operative 4 CHG Bathing Instructions   You can play a key role in reducing the risk of infection after surgery. Your skin needs to be as free of germs as possible. You can reduce the number of germs on your skin by washing with CHG (chlorhexidine  gluconate) soap before surgery. CHG is an antiseptic soap that kills germs and continues to kill germs even after washing.   DO NOT use if you have an allergy to chlorhexidine /CHG or antibacterial soaps. If your skin becomes reddened or irritated, stop using the CHG and notify one of our RNs at [phone].   Please shower with the CHG soap starting 4 days before surgery using the following schedule:     Please keep  in mind the following:  DO NOT shave, including legs and underarms, starting the day of your first shower.   You may shave your face at any point before/day of surgery.  Place clean sheets on your bed the day you start using CHG soap. Use a clean washcloth (not used since being washed) for each shower. DO NOT sleep with pets once you start using the CHG.   CHG Shower Instructions:  Wash your face and private area with normal soap. If you choose to wash your hair, wash first with your normal shampoo.  After you use shampoo/soap, rinse your hair  and body thoroughly to remove shampoo/soap residue.  Turn the water OFF and apply  bottle of CHG soap to a CLEAN washcloth.  Apply CHG soap ONLY FROM YOUR NECK DOWN TO YOUR TOES (washing for 3-5 minutes)  DO NOT use CHG soap on face, private areas, open wounds, or sores.  Pay special attention to the area where your surgery is being performed.  If you are having back surgery, having someone wash your back for you may be helpful. Wait 2 minutes after CHG soap is applied, then you may rinse off the CHG soap.  Pat dry with a clean towel  Put on clean clothes/pajamas   If you choose to wear lotion, please use ONLY the CHG-compatible lotions that are listed below.  Additional instructions for the day of surgery:  If you choose, you may shower the morning of surgery with an antibacterial soap.  DO NOT APPLY any lotions, deodorants, cologne, or perfumes.   Do not bring valuables to the hospital. [facility] is not responsible for any belongings/valuables. Do not wear nail polish, gel polish, artificial nails, or any other type of covering on natural nails (fingers and toes) Do not wear jewelry or makeup Put on clean/comfortable clothes.  Please brush your teeth.  Ask your nurse before applying any prescription medications to the skin.     CHG Compatible Lotions   Aveeno Moisturizing lotion  Cetaphil Moisturizing Cream  Cetaphil Moisturizing Lotion  Clairol Herbal Essence Moisturizing Lotion, Dry Skin  Clairol Herbal Essence Moisturizing Lotion, Extra Dry Skin  Clairol Herbal Essence Moisturizing Lotion, Normal Skin  Curel Age Defying Therapeutic Moisturizing Lotion with Alpha Hydroxy  Curel Extreme Care Body Lotion  Curel Soothing Hands Moisturizing Hand Lotion  Curel Therapeutic Moisturizing Cream, Fragrance-Free  Curel Therapeutic Moisturizing Lotion, Fragrance-Free  Curel Therapeutic Moisturizing Lotion, Original Formula  Eucerin Daily Replenishing Lotion  Eucerin Dry Skin  Therapy Plus Alpha Hydroxy Crme  Eucerin Dry Skin Therapy Plus Alpha Hydroxy Lotion  Eucerin Original Crme  Eucerin Original Lotion  Eucerin Plus Crme Eucerin Plus Lotion  Eucerin TriLipid Replenishing Lotion  Keri Anti-Bacterial Hand Lotion  Keri Deep Conditioning Original Lotion Dry Skin Formula Softly Scented  Keri Deep Conditioning Original Lotion, Fragrance Free Sensitive Skin Formula  Keri Lotion Fast Absorbing Fragrance Free Sensitive Skin Formula  Keri Lotion Fast Absorbing Softly Scented Dry Skin Formula  Keri Original Lotion  Keri Skin Renewal Lotion Keri Silky Smooth Lotion  Keri Silky Smooth Sensitive Skin Lotion  Nivea Body Creamy Conditioning Oil  Nivea Body Extra Enriched Lotion  Nivea Body Original Lotion  Nivea Body Sheer Moisturizing Lotion Nivea Crme  Nivea Skin Firming Lotion  NutraDerm 30 Skin Lotion  NutraDerm Skin Lotion  NutraDerm Therapeutic Skin Cream  NutraDerm Therapeutic Skin Lotion  ProShield Protective Hand Cream  Provon moisturizing lotion  Please read over the following fact sheets that you were  given.

## 2024-08-12 ENCOUNTER — Other Ambulatory Visit (HOSPITAL_COMMUNITY): Payer: Self-pay

## 2024-08-12 ENCOUNTER — Encounter (HOSPITAL_COMMUNITY): Payer: Self-pay

## 2024-08-12 ENCOUNTER — Other Ambulatory Visit: Payer: Self-pay

## 2024-08-12 ENCOUNTER — Encounter (HOSPITAL_COMMUNITY)
Admission: RE | Admit: 2024-08-12 | Discharge: 2024-08-12 | Disposition: A | Discharge status: Home / Self Care | Source: Ambulatory Visit | Attending: Neurosurgery | Admitting: Neurosurgery

## 2024-08-12 VITALS — BP 100/66 | HR 74 | Temp 97.8°F | Resp 18 | Ht 73.0 in | Wt 294.3 lb

## 2024-08-12 DIAGNOSIS — X58XXXD Exposure to other specified factors, subsequent encounter: Secondary | ICD-10-CM | POA: Diagnosis not present

## 2024-08-12 DIAGNOSIS — Z01810 Encounter for preprocedural cardiovascular examination: Secondary | ICD-10-CM | POA: Diagnosis present

## 2024-08-12 DIAGNOSIS — E669 Obesity, unspecified: Secondary | ICD-10-CM | POA: Diagnosis not present

## 2024-08-12 DIAGNOSIS — G8929 Other chronic pain: Secondary | ICD-10-CM | POA: Diagnosis not present

## 2024-08-12 DIAGNOSIS — Z96652 Presence of left artificial knee joint: Secondary | ICD-10-CM | POA: Diagnosis not present

## 2024-08-12 DIAGNOSIS — G629 Polyneuropathy, unspecified: Secondary | ICD-10-CM

## 2024-08-12 DIAGNOSIS — Z01818 Encounter for other preprocedural examination: Secondary | ICD-10-CM | POA: Diagnosis not present

## 2024-08-12 DIAGNOSIS — G4733 Obstructive sleep apnea (adult) (pediatric): Secondary | ICD-10-CM | POA: Insufficient documentation

## 2024-08-12 DIAGNOSIS — G4736 Sleep related hypoventilation in conditions classified elsewhere: Secondary | ICD-10-CM | POA: Diagnosis not present

## 2024-08-12 DIAGNOSIS — Z981 Arthrodesis status: Secondary | ICD-10-CM | POA: Insufficient documentation

## 2024-08-12 DIAGNOSIS — I7121 Aneurysm of the ascending aorta, without rupture: Secondary | ICD-10-CM | POA: Insufficient documentation

## 2024-08-12 DIAGNOSIS — Z79899 Other long term (current) drug therapy: Secondary | ICD-10-CM | POA: Insufficient documentation

## 2024-08-12 DIAGNOSIS — S32009K Unspecified fracture of unspecified lumbar vertebra, subsequent encounter for fracture with nonunion: Secondary | ICD-10-CM | POA: Diagnosis not present

## 2024-08-12 DIAGNOSIS — I10 Essential (primary) hypertension: Secondary | ICD-10-CM | POA: Diagnosis not present

## 2024-08-12 DIAGNOSIS — Z01811 Encounter for preprocedural respiratory examination: Secondary | ICD-10-CM

## 2024-08-12 DIAGNOSIS — J449 Chronic obstructive pulmonary disease, unspecified: Secondary | ICD-10-CM | POA: Diagnosis not present

## 2024-08-12 DIAGNOSIS — G35A Relapsing-remitting multiple sclerosis: Secondary | ICD-10-CM | POA: Insufficient documentation

## 2024-08-12 DIAGNOSIS — Z01812 Encounter for preprocedural laboratory examination: Secondary | ICD-10-CM | POA: Diagnosis present

## 2024-08-12 HISTORY — DX: Aneurysm of the ascending aorta, without rupture: I71.21

## 2024-08-12 HISTORY — DX: Disorders of diaphragm: J98.6

## 2024-08-12 LAB — BASIC METABOLIC PANEL WITH GFR
Anion gap: 9 (ref 5–15)
BUN: 16 mg/dL (ref 8–23)
CO2: 32 mmol/L (ref 22–32)
Calcium: 9.6 mg/dL (ref 8.9–10.3)
Chloride: 97 mmol/L — ABNORMAL LOW (ref 98–111)
Creatinine, Ser: 0.97 mg/dL (ref 0.61–1.24)
GFR, Estimated: >60 mL/min
Glucose, Bld: 98 mg/dL (ref 70–99)
Potassium: 4 mmol/L (ref 3.5–5.1)
Sodium: 138 mmol/L (ref 135–145)

## 2024-08-12 LAB — CBC
HCT: 40.1 % (ref 39.0–52.0)
Hemoglobin: 13.4 g/dL (ref 13.0–17.0)
MCH: 28.9 pg (ref 26.0–34.0)
MCHC: 33.4 g/dL (ref 30.0–36.0)
MCV: 86.4 fL (ref 80.0–100.0)
Platelets: 229 K/uL (ref 150–400)
RBC: 4.64 MIL/uL (ref 4.22–5.81)
RDW: 13.2 % (ref 11.5–15.5)
WBC: 5.4 K/uL (ref 4.0–10.5)
nRBC: 0 % (ref 0.0–0.2)

## 2024-08-12 LAB — TYPE AND SCREEN
ABO/RH(D): O NEG
Antibody Screen: NEGATIVE

## 2024-08-12 LAB — SURGICAL PCR SCREEN
MRSA, PCR: NEGATIVE
Staphylococcus aureus: NEGATIVE

## 2024-08-12 NOTE — Progress Notes (Signed)
 PCP - Dr. Dorothyann Byars Cardiologist - Dr. Redell Shallow  PPM/ICD - denies   Chest x-ray - 02/20/23 EKG - 08/12/24 Stress Test - denies ECHO - 07/13/19 Cardiac Cath - denies  Sleep Study - OSA+, CPAP, pt unsure of pressure settings   DM- denies  Last dose of GLP1 agonist-  n/a GLP1 instructions:   ASA/Blood Thinner Instructions: n/a   ERAS Protcol - no, NPO   COVID TEST- n/a   Anesthesia review: yes, cardiac hx  Patient denies shortness of breath, fever, cough and chest pain at PAT appointment   All instructions explained to the patient, with a verbal understanding of the material. Patient agrees to go over the instructions while at home for a better understanding.  The opportunity to ask questions was provided.

## 2024-08-13 ENCOUNTER — Encounter (HOSPITAL_COMMUNITY): Payer: Self-pay

## 2024-08-13 NOTE — Progress Notes (Signed)
 Anesthesia Chart Review:  Case: [id number] Date/Time: 08/20/24 1100   Procedure: POSTERIOR SPINAL FUSION, 6 LEVELS - T10-T11 - T11-T12 - T12-L1 - L1-L2 - L2-L3 - L3-L4 redo posterolateral arthrodesis with removal of hardware and extension to L 4   Anesthesia type: General   Diagnosis: Lumbar pseudoarthrosis [S32.009K]   Pre-op diagnosis: Lumbar pseudoarthrosis   [facility] ROOM [facility] / [facility]   Surgeons: Onetha Kuba, MD       DISCUSSION: Patient is a 64 year old male scheduled for the above procedure.  History includes never smoker, COPD, multiple sclerosis (relapsing remitting MS), chronic pain, obesity, dyspnea, HTN, ascending thoracic aortic aneurysm (4.5 cm with aortic root 4.7 cm 08/03/2024 CTA chest), OSA (uses BiPAP), back surgery (T3-4 left laminectomy, T4-6 diskectomy 05/05/2003; L5-S1 PLIF 11/12/2004 with removal of hardware, L4-5 PLIF, posterolateral arthrodesis L4-S1 01/07/2007; removal of hardware L4-S1, L2-4 PLIF 04/01/2011; removal of hardware L2-4, L1-2 PLIF 04/17/2015; T10-L1 laminectomy/posterolateral arthrodesis 05/17/16; neck surgery (C5-7 ACDF 01/31/2010; C4-5 ACDF 09/29/2019), osteoarthritis (left TKA 01/08/2023, c/b postoperative Staphylococcus warneri infection, s/p ~ 6 month antibiotics per ID, completed cefadroxil 06/2023), chronic right hemidiaphragm elevation.  Cardiologist Dr. Pietro is following his ascending TAA. Last office visit was on 11/20/2022 and has continued to get serial chest imaging. Last CTA chest was on 08/04/2023 with ascending TAA measurement of 4.5 cm with 4.7 cm aortic root. Dr. Pietro recommended repeat CTA in one year.  TTE 08/2018 showed normal AV structure, no AR or AS, LVEF 60-65%, normal LVF, no LVH, grade 1 DD.    Multiple sclerosis with followed by [facility], last visit 06/03/2024 with Joycelyn Anes, PA-C. MS managed on Ocrevus  infusion 600 mg IV Q 6 months, last 02/02/2024 and on Adderall  and amantadine  for fatigue, baclofen for muscle spasms, and  duloxetine  and ibuprofen  for neuropathy.  They did discuss that if he requires additional lumbar surgery then plan to hold off on his January Ocrevus  until he is about 2 months out from surgery.    He had severe OSA and nocturnal hypoxemia by sleep study in 2023.  Per 12/10/2023 OSA follow-up with Dr. Neda, Most recent compliance data over the last 30 days shows 100% compliance with average use of 6 hours 32 minutes BiPAP settings of 18/8 with a pressure support of 6 Residual AHI of 1.4.    Anesthesia team to evaluate on the day of surgery.   VS: BP 100/66   Pulse 74   Temp 36.6 C   Resp 18   Ht 6' 1 (1.854 m)   Wt 133.5 kg   SpO2 97%   BMI 38.83 kg/m   PROVIDERS: Alvan Dorothyann BIRCH, MD is PCP  Pietro Rogue, MD is cardiologist Neda Hammond, MD is pulmonologist (for OSA) Gretel Lenis, MD is neurologist Doss Sharper, MD is ID ([facility]) Carolee Sherwood MOULD, MD is urologist at [facility]. Last seen 04/06/2024 for ED, BPH with LUTS follow-up.   LABS: Labs reviewed: Acceptable for surgery. (all labs ordered are listed, but only abnormal results are displayed)  Labs Reviewed  BASIC METABOLIC PANEL WITH GFR - Abnormal; Notable for the following components:      Result Value   Chloride 97 (*)    All other components within normal limits  SURGICAL PCR SCREEN  CBC  TYPE AND SCREEN    OTHER:  DG Sniff Test 04/10/2023: IMPRESSION: Positive sniff test with absence of diaphragmatic movement on the right.   Sleep Study/NPSG 11/15/2021: IMPRESSIONS - Severe obstructive sleep apnea occurred during  this study (AHI = 34.2/h). - Severe oxygen  desaturation was noted during this study (Min O2 = 62.0%). 2L O2 was added about 3:34 am - The patient snored with loud snoring volume. - EKG findings include PVCs. - Clinically significant periodic limb movements did not occur during sleep. No significant associated arousals.   DIAGNOSIS - Obstructive Sleep  Apnea (G47.33) - Nocturnal Hypoxemia (G47.36)   RECOMMENDATIONS - Therapeutic CPAP titration to determine optimal pressure required to alleviate sleep disordered breathing. - Add 2 L O2 during sleep..   IMAGES: CTA Chest 08/03/2024: IMPRESSION: 1. Stable mild fusiform aneurysmal dilation of the descending thoracic aorta with a maximal diameter of 4.5 cm. Ascending thoracic aortic aneurysm. The aortic root measures 4.7 cm in maximal diameter at the sinuses of ValsalvaRecommend semi-annual imaging followup by CTA or MRA and referral to cardiothoracic surgery if not already obtained. This recommendation follows 2010 ACCF/AHA/AATS/ACR/ASA/SCA/SCAI/SIR/STS/SVM Guidelines for the Diagnosis and Management of Patients With Thoracic Aortic Disease. Circulation. 2010; 121: Z733-z630. Aortic aneurysm NOS (ICD10-I71.9) 2. No acute cardiopulmonary process. 3. Chronic elevation of the right hemidiaphragm.   CT L-spine 05/06/2024: IMPRESSION: IMPRESSION: 1. Extensive prior posterior decompression and fusion with solid arthrodesis from L3-S1 and persistent lack of solid fusion from T12-L3. Unchanged fractures of the interconnecting rods at T12-L1 and of both L2 screws. 2. No evidence of significant osseous spinal canal or neural foraminal stenosis.  CT C-spine 07/08/2023: IMPRESSION: 1. No acute osseous abnormality. 2. Previous C4-C7 ACDF with solid arthrodesis from C5-C7 and evidence of pseudoarthrosis at C4-5. 3. Moderate spinal stenosis at C3-4 and C4-5. 4. Severe neural foraminal stenosis at C3-4, C4-5, and C7-T1.  MRI Brain 05/16/2023 ([facility]): IMPRESSION: 1.  Punctate T2 FLAIR hyperintense foci most notably in the left parasagittal frontal lobe, not confidently identified on prior imaging. These findings may related to differences in technique. However, this could represent some subtle progression of demyelinating disease. None of the lesions enhance to suggest active demyelinating  disease.  2.  Focal area of dural thickening along the right posterior falx measuring up to 1.2 x 0.4 cm which demonstrates apparent enhancement, previously nonenhancing. This finding is favored to reflect a small meningioma, though a benign dural ossification is a possibility as well. Attention on follow-up imaging is recommended.    EKG:  EKG 08/12/2024: Normal sinus rhythm  Low voltage QRS  Incomplete right bundle branch block  Confirmed by Okey Moccasin ([id number]) on 08/12/2024 2:49:36 P   CV: Echo 07/13/2019: MPRESSIONS   1. Left ventricular ejection fraction, by visual estimation, is 60 to  65%. The left ventricle has normal function. There is no left ventricular  hypertrophy.   2. Left ventricular diastolic parameters are consistent with Grade I  diastolic dysfunction (impaired relaxation).   3. The left ventricle has no regional wall motion abnormalities.   4. The mitral valve is normal in structure. No evidence of mitral valve  regurgitation. No evidence of mitral stenosis.   5. The tricuspid valve is normal in structure.   6. The aortic valve is normal in structure. Aortic valve regurgitation is  not visualized. No evidence of aortic valve sclerosis or stenosis.   7. The pulmonic valve was normal in structure. Pulmonic valve  regurgitation is not visualized.   8. Aneurysm of the ascending aorta.   9. There is moderate dilatation of the ascending aorta measuring 49 mm.    Past Medical History:  Diagnosis Date   Anxiety    chronic pain treated with narcotics  Arthritis    Chronic pain    Constipation due to opioid therapy    COPD (chronic obstructive pulmonary disease) (HCC)    Depression    Dysphonia    thoracic left sidedimpingement with heminumbness   Dyspnea    Elevated hemidiaphragm    on right   GI bleed    abt 15 years ago   Hypertension    Morbid obesity (HCC)    MS (multiple sclerosis) 02/11/2013   ABAGIO  Per. Dr Gretel   Neuromuscular disorder [facility])  2014   Pneumonia    Preoperative respiratory examination 10/18/2022   Sleep apnea    Sleep apnea with use of continuous positive airway pressure (CPAP) 02/11/2013   07-13-12 AHi of 98.6 /hr titrated to 11 cm water ,  average user time 4 hours and 4 minutes. Residual AHI 1.20 December 2012 .   Thoracic ascending aortic aneurysm    Umbilical hernia     Past Surgical History:  Procedure Laterality Date   ANTERIOR CERVICAL DECOMP/DISCECTOMY FUSION N/A 09/29/2019   Procedure: Anterior Cervical Decompression Fusion - Cervical Four-Cervical Five;  Surgeon: Onetha Kuba, MD;  Location: [facility] OR;  Service: Neurosurgery;  Laterality: N/A;  Anterior Cervical Decompression Fusion - Cervical Four-Cervical Five    CARPAL TUNNEL RELEASE Right    Dr. Onetha CARRY TUNNEL RELEASE Right 09/29/2019   Procedure: Carpal Tunnel Release;  Surgeon: Onetha Kuba, MD;  Location: [facility] OR;  Service: Neurosurgery;  Laterality: Right;   COLONOSCOPY W/ POLYPECTOMY     ESOPHAGOGASTRODUODENOSCOPY     MULTIPLE TOOTH EXTRACTIONS     POSTERIOR LUMBAR FUSION  04/17/2015   Dr. Onetha, also removed old hardware   SPINE SURGERY     total of six vertebras fused, three lumbar surgeries and one anterior neck fusion   TONSILLECTOMY      MEDICATIONS:  amantadine  (SYMMETREL ) 100 MG capsule   amphetamine -dextroamphetamine  (ADDERALL  XR) 30 MG 24 hr capsule   baclofen (LIORESAL) 10 MG tablet   DULoxetine  (CYMBALTA ) 60 MG capsule   famotidine  (PEPCID ) 20 MG tablet   furosemide  (LASIX ) 20 MG tablet   HYDROcodone -acetaminophen  (NORCO) 10-325 MG tablet   hydrOXYzine  (VISTARIL ) 25 MG capsule   losartan -hydrochlorothiazide  (HYZAAR) 50-12.5 MG tablet   morphine  (MS CONTIN ) 60 MG 12 hr tablet   morphine  (MS CONTIN ) 60 MG 12 hr tablet   morphine  (MS CONTIN ) 60 MG 12 hr tablet   morphine  (MS CONTIN ) 60 MG 12 hr tablet   morphine  (MS CONTIN ) 60 MG 12 hr tablet   morphine  (MS CONTIN ) 60 MG 12 hr tablet   potassium chloride  (KLOR-CON  M) 10 MEQ  tablet   tamsulosin  (FLOMAX ) 0.4 MG CAPS capsule   No current facility-administered medications for this encounter.    Isaiah Ruder, PA-C Surgical Short Stay/Anesthesiology [facility] Phone [phone] [facility] Phone [phone] 08/13/2024 5:32 PM

## 2024-08-13 NOTE — Anesthesia Preprocedure Evaluation (Addendum)
 "                                  Anesthesia Evaluation  Patient identified by MRN, date of birth, ID band Patient awake    Reviewed: Allergy & Precautions, NPO status , Patient's Chart, lab work & pertinent test results  Airway Mallampati: III  TM Distance: >3 FB Neck ROM: Limited    Dental no notable dental hx.    Pulmonary sleep apnea and Continuous Positive Airway Pressure Ventilation , COPD   Pulmonary exam normal        Cardiovascular hypertension,  Rhythm:Regular Rate:Normal  Echo 07/13/2019: MPRESSIONS   1. Left ventricular ejection fraction, by visual estimation, is 60 to  65%. The left ventricle has normal function. There is no left ventricular  hypertrophy.   2. Left ventricular diastolic parameters are consistent with Grade I  diastolic dysfunction (impaired relaxation).   3. The left ventricle has no regional wall motion abnormalities.   4. The mitral valve is normal in structure. No evidence of mitral valve  regurgitation. No evidence of mitral stenosis.   5. The tricuspid valve is normal in structure.   6. The aortic valve is normal in structure. Aortic valve regurgitation is  not visualized. No evidence of aortic valve sclerosis or stenosis.   7. The pulmonic valve was normal in structure. Pulmonic valve  regurgitation is not visualized.   8. Aneurysm of the ascending aorta.   9. There is moderate dilatation of the ascending aorta measuring 49 mm.       Neuro/Psych   Anxiety Depression    MS    GI/Hepatic negative GI ROS, Neg liver ROS,,,  Endo/Other  negative endocrine ROS    Renal/GU negative Renal ROS     Musculoskeletal  (+) Arthritis , Osteoarthritis,    Abdominal Normal abdominal exam  (+)   Peds  Hematology  (+) Blood dyscrasia, anemia Lab Results      Component                Value               Date                      WBC                      5.4                 08/12/2024                HGB                       13.4                08/12/2024                HCT                      40.1                08/12/2024                MCV                      86.4  08/12/2024                PLT                      229                 08/12/2024             Lab Results      Component                Value               Date                      NA                       138                 08/12/2024                K                        4.0                 08/12/2024                CO2                      32                  08/12/2024                GLUCOSE                  98                  08/12/2024                BUN                      16                  08/12/2024                CREATININE               0.97                08/12/2024                CALCIUM                  9.6                 08/12/2024                EGFR                     92                  04/05/2024                GFRNONAA                 >60                 08/12/2024  Anesthesia Other Findings Last CTA chest was on 08/04/2023 with ascending TAA measurement of 4.5 cm with 4.7 cm aortic root.   Extensive prior back surgery: back surgery (T3-4 left laminectomy, T4-6 diskectomy 05/05/2003; L5-S1 PLIF 11/12/2004 with removal of hardware, L4-5 PLIF, posterolateral arthrodesis L4-S1 01/07/2007; removal of hardware L4-S1, L2-4 PLIF 04/01/2011; removal of hardware L2-4, L1-2 PLIF 04/17/2015; T10-L1 laminectomy/posterolateral arthrodesis 05/17/16; neck surgery (C5-7 ACDF 01/31/2010; C4-5 ACDF 09/29/2019  Reproductive/Obstetrics                              Anesthesia Physical Anesthesia Plan  ASA: 3  Anesthesia Plan: General   Post-op Pain Management: Tylenol  PO (pre-op)*, Celebrex  PO (pre-op)* and Ketamine  IV*   Induction: Intravenous  PONV Risk Score and Plan: 2 and Ondansetron , Dexamethasone , Midazolam  and Treatment may vary due to age or medical condition  Airway  Management Planned: Mask, Oral ETT and Video Laryngoscope Planned  Additional Equipment: Arterial line  Intra-op Plan:   Post-operative Plan: Possible Post-op intubation/ventilation  Informed Consent: I have reviewed the patients History and Physical, chart, labs and discussed the procedure including the risks, benefits and alternatives for the proposed anesthesia with the patient or authorized representative who has indicated his/her understanding and acceptance.     Dental advisory given  Plan Discussed with: CRNA  Anesthesia Plan Comments: (PAT note written 08/13/2024 by Isaiah Ruder, PA-C.  )         Anesthesia Quick Evaluation  "

## 2024-08-19 ENCOUNTER — Other Ambulatory Visit: Payer: Self-pay

## 2024-08-19 ENCOUNTER — Other Ambulatory Visit (HOSPITAL_COMMUNITY): Payer: Self-pay

## 2024-08-20 ENCOUNTER — Inpatient Hospital Stay (HOSPITAL_COMMUNITY)

## 2024-08-20 ENCOUNTER — Inpatient Hospital Stay (HOSPITAL_COMMUNITY)
Admission: RE | Admit: 2024-08-20 | Discharge: 2024-08-23 | DRG: 448 | Disposition: A | Attending: Neurosurgery | Admitting: Neurosurgery

## 2024-08-20 ENCOUNTER — Inpatient Hospital Stay (HOSPITAL_COMMUNITY): Payer: Self-pay | Admitting: Vascular Surgery

## 2024-08-20 ENCOUNTER — Encounter (HOSPITAL_COMMUNITY): Payer: Self-pay | Admitting: Neurosurgery

## 2024-08-20 ENCOUNTER — Encounter (HOSPITAL_COMMUNITY): Admission: RE | Disposition: A | Payer: Self-pay | Source: Home / Self Care | Attending: Neurosurgery

## 2024-08-20 ENCOUNTER — Other Ambulatory Visit (HOSPITAL_COMMUNITY): Payer: Self-pay

## 2024-08-20 DIAGNOSIS — S32039A Unspecified fracture of third lumbar vertebra, initial encounter for closed fracture: Secondary | ICD-10-CM | POA: Diagnosis present

## 2024-08-20 DIAGNOSIS — Y792 Prosthetic and other implants, materials and accessory orthopedic devices associated with adverse incidents: Secondary | ICD-10-CM | POA: Diagnosis present

## 2024-08-20 DIAGNOSIS — I7121 Aneurysm of the ascending aorta, without rupture: Secondary | ICD-10-CM | POA: Diagnosis present

## 2024-08-20 DIAGNOSIS — M960 Pseudarthrosis after fusion or arthrodesis: Principal | ICD-10-CM | POA: Diagnosis present

## 2024-08-20 DIAGNOSIS — J449 Chronic obstructive pulmonary disease, unspecified: Secondary | ICD-10-CM | POA: Diagnosis present

## 2024-08-20 DIAGNOSIS — S32009K Unspecified fracture of unspecified lumbar vertebra, subsequent encounter for fracture with nonunion: Principal | ICD-10-CM | POA: Diagnosis present

## 2024-08-20 DIAGNOSIS — Z6838 Body mass index (BMI) 38.0-38.9, adult: Secondary | ICD-10-CM

## 2024-08-20 DIAGNOSIS — F32A Depression, unspecified: Secondary | ICD-10-CM | POA: Diagnosis present

## 2024-08-20 DIAGNOSIS — T84216A Breakdown (mechanical) of internal fixation device of vertebrae, initial encounter: Secondary | ICD-10-CM | POA: Diagnosis present

## 2024-08-20 DIAGNOSIS — G8929 Other chronic pain: Secondary | ICD-10-CM | POA: Diagnosis present

## 2024-08-20 DIAGNOSIS — Z79899 Other long term (current) drug therapy: Secondary | ICD-10-CM

## 2024-08-20 DIAGNOSIS — F419 Anxiety disorder, unspecified: Secondary | ICD-10-CM | POA: Diagnosis present

## 2024-08-20 DIAGNOSIS — Y838 Other surgical procedures as the cause of abnormal reaction of the patient, or of later complication, without mention of misadventure at the time of the procedure: Secondary | ICD-10-CM | POA: Diagnosis present

## 2024-08-20 DIAGNOSIS — I10 Essential (primary) hypertension: Secondary | ICD-10-CM | POA: Diagnosis present

## 2024-08-20 DIAGNOSIS — G35D Multiple sclerosis, unspecified: Secondary | ICD-10-CM | POA: Diagnosis present

## 2024-08-20 DIAGNOSIS — M1990 Unspecified osteoarthritis, unspecified site: Secondary | ICD-10-CM | POA: Diagnosis present

## 2024-08-20 DIAGNOSIS — G4730 Sleep apnea, unspecified: Secondary | ICD-10-CM | POA: Diagnosis present

## 2024-08-20 MED ORDER — FUROSEMIDE 20 MG PO TABS: 20.0000 mg | ORAL_TABLET | Freq: Every day | ORAL | Status: DC | PRN

## 2024-08-20 MED ORDER — FENTANYL CITRATE (PF) 100 MCG/2ML IJ SOLN
25.0000 ug | INTRAMUSCULAR | Status: DC | PRN
  Administered 2024-08-20 (×2): 50 ug via INTRAVENOUS

## 2024-08-20 MED ORDER — DROPERIDOL 2.5 MG/ML IJ SOLN: 0.6250 mg | Freq: Once | INTRAMUSCULAR | Status: DC | PRN

## 2024-08-20 MED ORDER — MORPHINE SULFATE ER 60 MG PO TBCR: 60.0000 mg | EXTENDED_RELEASE_TABLET | Freq: Two times a day (BID) | ORAL | Status: DC

## 2024-08-20 MED ORDER — ONDANSETRON HCL 4 MG/2ML IJ SOLN: 4.0000 mg | Freq: Four times a day (QID) | INTRAMUSCULAR | Status: DC | PRN

## 2024-08-20 MED ORDER — 0.9 % SODIUM CHLORIDE (POUR BTL) OPTIME
TOPICAL | Status: DC | PRN
  Administered 2024-08-20: 1000 mL

## 2024-08-20 MED ORDER — ORAL CARE MOUTH RINSE: 15.0000 mL | Freq: Once | OROMUCOSAL | Status: AC

## 2024-08-20 MED ORDER — FAMOTIDINE 20 MG PO TABS
20.0000 mg | ORAL_TABLET | Freq: Two times a day (BID) | ORAL | Status: DC
  Administered 2024-08-20 – 2024-08-23 (×6): 20 mg via ORAL
  Filled 2024-08-20 (×6): qty 1

## 2024-08-20 MED ORDER — METHADONE HCL IV SYRINGE 10 MG/ML FOR CABG
10.0000 mg | Freq: Once | INTRAMUSCULAR | Status: AC
  Administered 2024-08-20: 10 mg via INTRAVENOUS
  Filled 2024-08-20: qty 1

## 2024-08-20 MED ORDER — DULOXETINE HCL 60 MG PO CPEP
60.0000 mg | ORAL_CAPSULE | Freq: Two times a day (BID) | ORAL | Status: DC
  Administered 2024-08-20 – 2024-08-23 (×6): 60 mg via ORAL
  Filled 2024-08-20 (×5): qty 1
  Filled 2024-08-20: qty 2

## 2024-08-20 MED ORDER — KETAMINE HCL 50 MG/5ML IJ SOSY
PREFILLED_SYRINGE | INTRAMUSCULAR | Status: AC
  Filled 2024-08-20: qty 5

## 2024-08-20 MED ORDER — HYDROMORPHONE HCL 1 MG/ML IJ SOLN: 0.2500 mg | INTRAMUSCULAR | Status: DC | PRN

## 2024-08-20 MED ORDER — LACTATED RINGERS IV SOLN: INTRAVENOUS | Status: DC

## 2024-08-20 MED ORDER — SUGAMMADEX SODIUM 200 MG/2ML IV SOLN
INTRAVENOUS | Status: DC | PRN
  Administered 2024-08-20: 400 mg via INTRAVENOUS

## 2024-08-20 MED ORDER — MENTHOL 3 MG MT LOZG: 1.0000 | LOZENGE | OROMUCOSAL | Status: DC | PRN

## 2024-08-20 MED ORDER — MORPHINE SULFATE 15 MG PO TABS
30.0000 mg | ORAL_TABLET | ORAL | Status: DC | PRN
  Administered 2024-08-21 (×2): 30 mg via ORAL
  Filled 2024-08-20 (×2): qty 2

## 2024-08-20 MED ORDER — LOSARTAN POTASSIUM 50 MG PO TABS
50.0000 mg | ORAL_TABLET | Freq: Every day | ORAL | Status: DC
  Administered 2024-08-20 – 2024-08-23 (×4): 50 mg via ORAL
  Filled 2024-08-20 (×4): qty 1

## 2024-08-20 MED ORDER — ACETAMINOPHEN 650 MG RE SUPP: 650.0000 mg | RECTAL | Status: DC | PRN

## 2024-08-20 MED ORDER — ALBUMIN HUMAN 5 % IV SOLN: INTRAVENOUS | Status: DC | PRN

## 2024-08-20 MED ORDER — PROPOFOL 500 MG/50ML IV EMUL
INTRAVENOUS | Status: DC | PRN
  Administered 2024-08-20: 50 ug/kg/min via INTRAVENOUS

## 2024-08-20 MED ORDER — BUPIVACAINE LIPOSOME 1.3 % IJ SUSP
INTRAMUSCULAR | Status: DC | PRN
  Administered 2024-08-20: 20 mL

## 2024-08-20 MED ORDER — LIDOCAINE-EPINEPHRINE 1 %-1:100000 IJ SOLN
INTRAMUSCULAR | Status: DC | PRN
  Administered 2024-08-20: 10 mL

## 2024-08-20 MED ORDER — ACETAMINOPHEN 500 MG PO TABS
ORAL_TABLET | ORAL | Status: AC
  Filled 2024-08-20: qty 1

## 2024-08-20 MED ORDER — PHENYLEPHRINE HCL-NACL 20-0.9 MG/250ML-% IV SOLN
INTRAVENOUS | Status: DC | PRN
  Administered 2024-08-20: 50 ug/min via INTRAVENOUS

## 2024-08-20 MED ORDER — PHENYLEPHRINE 80 MCG/ML (10ML) SYRINGE FOR IV PUSH (FOR BLOOD PRESSURE SUPPORT)
PREFILLED_SYRINGE | INTRAVENOUS | Status: DC | PRN
  Administered 2024-08-20 (×2): 160 ug via INTRAVENOUS
  Administered 2024-08-20: 240 ug via INTRAVENOUS
  Administered 2024-08-20 (×3): 160 ug via INTRAVENOUS

## 2024-08-20 MED ORDER — ROCURONIUM BROMIDE 10 MG/ML (PF) SYRINGE
PREFILLED_SYRINGE | INTRAVENOUS | Status: DC | PRN
  Administered 2024-08-20: 10 mg via INTRAVENOUS
  Administered 2024-08-20: 20 mg via INTRAVENOUS
  Administered 2024-08-20: 70 mg via INTRAVENOUS
  Administered 2024-08-20: 10 mg via INTRAVENOUS

## 2024-08-20 MED ORDER — CHLORHEXIDINE GLUCONATE CLOTH 2 % EX PADS: 6.0000 | MEDICATED_PAD | Freq: Once | CUTANEOUS | Status: DC

## 2024-08-20 MED ORDER — HYDROCODONE-ACETAMINOPHEN 10-325 MG PO TABS
1.0000 | ORAL_TABLET | ORAL | Status: DC | PRN
  Administered 2024-08-20 – 2024-08-21 (×2): 1 via ORAL
  Filled 2024-08-20 (×2): qty 1

## 2024-08-20 MED ORDER — BUPIVACAINE HCL (PF) 0.25 % IJ SOLN
INTRAMUSCULAR | Status: AC
  Filled 2024-08-20: qty 30

## 2024-08-20 MED ORDER — HYDROCHLOROTHIAZIDE 12.5 MG PO TABS
12.5000 mg | ORAL_TABLET | Freq: Every day | ORAL | Status: DC
  Administered 2024-08-21 – 2024-08-23 (×3): 12.5 mg via ORAL
  Filled 2024-08-20 (×4): qty 1

## 2024-08-20 MED ORDER — ONDANSETRON HCL 4 MG PO TABS: 4.0000 mg | ORAL_TABLET | Freq: Four times a day (QID) | ORAL | Status: DC | PRN

## 2024-08-20 MED ORDER — CYCLOBENZAPRINE HCL 10 MG PO TABS
10.0000 mg | ORAL_TABLET | Freq: Three times a day (TID) | ORAL | Status: DC | PRN
  Administered 2024-08-20 – 2024-08-22 (×4): 10 mg via ORAL
  Filled 2024-08-20 (×4): qty 1

## 2024-08-20 MED ORDER — POTASSIUM CHLORIDE CRYS ER 10 MEQ PO TBCR: 10.0000 meq | EXTENDED_RELEASE_TABLET | Freq: Every day | ORAL | Status: DC | PRN

## 2024-08-20 MED ORDER — MIDAZOLAM HCL (PF) 2 MG/2ML IJ SOLN
INTRAMUSCULAR | Status: DC | PRN
  Administered 2024-08-20: 2 mg via INTRAVENOUS

## 2024-08-20 MED ORDER — LIDOCAINE 2% (20 MG/ML) 5 ML SYRINGE
INTRAMUSCULAR | Status: DC | PRN
  Administered 2024-08-20: 80 mg via INTRAVENOUS

## 2024-08-20 MED ORDER — SODIUM CHLORIDE 0.9% FLUSH: 3.0000 mL | INTRAVENOUS | Status: DC | PRN

## 2024-08-20 MED ORDER — ACETAMINOPHEN 500 MG PO TABS
500.0000 mg | ORAL_TABLET | Freq: Once | ORAL | Status: AC
  Administered 2024-08-20: 500 mg via ORAL

## 2024-08-20 MED ORDER — PROPOFOL 10 MG/ML IV BOLUS
INTRAVENOUS | Status: AC
  Filled 2024-08-20: qty 20

## 2024-08-20 MED ORDER — LOSARTAN POTASSIUM-HCTZ 50-12.5 MG PO TABS: 1.0000 | ORAL_TABLET | Freq: Every day | ORAL | Status: DC

## 2024-08-20 MED ORDER — HYDROXYZINE HCL 25 MG PO TABS
25.0000 mg | ORAL_TABLET | Freq: Three times a day (TID) | ORAL | Status: DC | PRN
  Filled 2024-08-20: qty 3

## 2024-08-20 MED ORDER — MIDAZOLAM HCL 2 MG/2ML IJ SOLN
INTRAMUSCULAR | Status: AC
  Filled 2024-08-20: qty 2

## 2024-08-20 MED ORDER — LIDOCAINE IN D5W 4-5 MG/ML-% IV SOLN
1.0000 mg/min | INTRAVENOUS | Status: AC
  Administered 2024-08-20: 1.33 mg/min via INTRAVENOUS
  Filled 2024-08-20: qty 500

## 2024-08-20 MED ORDER — VASOPRESSIN 20 UNIT/ML IV SOLN
INTRAVENOUS | Status: AC
  Filled 2024-08-20: qty 1

## 2024-08-20 MED ORDER — ACETAMINOPHEN 500 MG PO TABS: 1000.0000 mg | ORAL_TABLET | Freq: Once | ORAL | Status: DC

## 2024-08-20 MED ORDER — CEFAZOLIN SODIUM-DEXTROSE 3-4 GM/150ML-% IV SOLN
3.0000 g | INTRAVENOUS | Status: AC
  Administered 2024-08-20: 3 g via INTRAVENOUS
  Filled 2024-08-20: qty 150

## 2024-08-20 MED ORDER — THROMBIN 5000 UNITS EX KIT
PACK | CUTANEOUS | Status: AC
  Filled 2024-08-20: qty 1

## 2024-08-20 MED ORDER — BUPIVACAINE LIPOSOME 1.3 % IJ SUSP
INTRAMUSCULAR | Status: AC
  Filled 2024-08-20: qty 20

## 2024-08-20 MED ORDER — FENTANYL CITRATE (PF) 100 MCG/2ML IJ SOLN
INTRAMUSCULAR | Status: AC
  Filled 2024-08-20: qty 2

## 2024-08-20 MED ORDER — MORPHINE SULFATE ER 30 MG PO TBCR
60.0000 mg | EXTENDED_RELEASE_TABLET | Freq: Two times a day (BID) | ORAL | Status: DC
  Administered 2024-08-20 – 2024-08-23 (×6): 60 mg via ORAL
  Filled 2024-08-20 (×6): qty 2

## 2024-08-20 MED ORDER — AMANTADINE HCL 100 MG PO CAPS
100.0000 mg | ORAL_CAPSULE | Freq: Three times a day (TID) | ORAL | Status: DC
  Administered 2024-08-20 – 2024-08-23 (×8): 100 mg via ORAL
  Filled 2024-08-20 (×8): qty 1

## 2024-08-20 MED ORDER — CEFAZOLIN SODIUM-DEXTROSE 2-4 GM/100ML-% IV SOLN
2.0000 g | Freq: Three times a day (TID) | INTRAVENOUS | Status: AC
  Administered 2024-08-20 – 2024-08-22 (×6): 2 g via INTRAVENOUS
  Filled 2024-08-20 (×5): qty 100

## 2024-08-20 MED ORDER — THROMBIN 20000 UNITS EX SOLR
CUTANEOUS | Status: AC
  Filled 2024-08-20: qty 20000

## 2024-08-20 MED ORDER — ACETAMINOPHEN 325 MG PO TABS: 650.0000 mg | ORAL_TABLET | ORAL | Status: DC | PRN

## 2024-08-20 MED ORDER — ALUM & MAG HYDROXIDE-SIMETH 200-200-20 MG/5ML PO SUSP: 30.0000 mL | Freq: Four times a day (QID) | ORAL | Status: DC | PRN

## 2024-08-20 MED ORDER — DEXAMETHASONE SOD PHOSPHATE PF 10 MG/ML IJ SOLN
INTRAMUSCULAR | Status: DC | PRN
  Administered 2024-08-20: 10 mg via INTRAVENOUS

## 2024-08-20 MED ORDER — BACLOFEN 10 MG PO TABS
10.0000 mg | ORAL_TABLET | Freq: Three times a day (TID) | ORAL | Status: DC
  Administered 2024-08-21 – 2024-08-23 (×5): 10 mg via ORAL
  Filled 2024-08-20 (×8): qty 1

## 2024-08-20 MED ORDER — LACTATED RINGERS IV SOLN: INTRAVENOUS | Status: DC | PRN

## 2024-08-20 MED ORDER — SODIUM CHLORIDE 0.9 % IV SOLN: 250.0000 mL | INTRAVENOUS | Status: AC

## 2024-08-20 MED ORDER — CELECOXIB 200 MG PO CAPS
200.0000 mg | ORAL_CAPSULE | Freq: Once | ORAL | Status: AC
  Administered 2024-08-20: 200 mg via ORAL
  Filled 2024-08-20: qty 1

## 2024-08-20 MED ORDER — KETAMINE HCL 10 MG/ML IJ SOLN
INTRAMUSCULAR | Status: DC | PRN
  Administered 2024-08-20: 40 mg via INTRAVENOUS
  Administered 2024-08-20: 10 mg via INTRAVENOUS

## 2024-08-20 MED ORDER — HYDROMORPHONE HCL 1 MG/ML IJ SOLN
0.5000 mg | INTRAMUSCULAR | Status: DC | PRN
  Administered 2024-08-20 – 2024-08-21 (×3): 0.5 mg via INTRAVENOUS
  Filled 2024-08-20 (×3): qty 0.5

## 2024-08-20 MED ORDER — VASOPRESSIN 20 UNIT/ML IV SOLN
INTRAVENOUS | Status: DC | PRN
  Administered 2024-08-20 (×7): .5 [IU] via INTRAVENOUS

## 2024-08-20 MED ORDER — PHENYLEPHRINE HCL-NACL 20-0.9 MG/250ML-% IV SOLN
INTRAVENOUS | Status: AC
  Filled 2024-08-20: qty 250

## 2024-08-20 MED ORDER — AMPHETAMINE-DEXTROAMPHET ER 10 MG PO CP24
30.0000 mg | ORAL_CAPSULE | Freq: Every morning | ORAL | Status: DC
  Administered 2024-08-21 – 2024-08-23 (×3): 30 mg via ORAL
  Filled 2024-08-20 (×3): qty 3

## 2024-08-20 MED ORDER — PANTOPRAZOLE SODIUM 40 MG IV SOLR
40.0000 mg | Freq: Every day | INTRAVENOUS | Status: DC
  Administered 2024-08-20: 40 mg via INTRAVENOUS
  Filled 2024-08-20: qty 10

## 2024-08-20 MED ORDER — TAMSULOSIN HCL 0.4 MG PO CAPS
0.4000 mg | ORAL_CAPSULE | Freq: Every day | ORAL | Status: DC
  Administered 2024-08-21 – 2024-08-22 (×2): 0.4 mg via ORAL
  Filled 2024-08-20 (×3): qty 1

## 2024-08-20 MED ORDER — PROPOFOL 10 MG/ML IV BOLUS
INTRAVENOUS | Status: DC | PRN
  Administered 2024-08-20: 170 mg via INTRAVENOUS

## 2024-08-20 MED ORDER — EPHEDRINE SULFATE-NACL 50-0.9 MG/10ML-% IV SOSY
PREFILLED_SYRINGE | INTRAVENOUS | Status: DC | PRN
  Administered 2024-08-20: 10 mg via INTRAVENOUS
  Administered 2024-08-20: 5 mg via INTRAVENOUS
  Administered 2024-08-20 (×2): 10 mg via INTRAVENOUS

## 2024-08-20 MED ORDER — ONDANSETRON HCL 4 MG/2ML IJ SOLN
INTRAMUSCULAR | Status: DC | PRN
  Administered 2024-08-20: 4 mg via INTRAVENOUS

## 2024-08-20 MED ORDER — FENTANYL CITRATE (PF) 100 MCG/2ML IJ SOLN
INTRAMUSCULAR | Status: DC | PRN
  Administered 2024-08-20 (×2): 50 ug via INTRAVENOUS

## 2024-08-20 MED ORDER — CHLORHEXIDINE GLUCONATE 0.12 % MT SOLN
15.0000 mL | Freq: Once | OROMUCOSAL | Status: AC
  Administered 2024-08-20: 15 mL via OROMUCOSAL
  Filled 2024-08-20: qty 15

## 2024-08-20 MED ORDER — LIDOCAINE-EPINEPHRINE 1 %-1:100000 IJ SOLN
INTRAMUSCULAR | Status: AC
  Filled 2024-08-20: qty 1

## 2024-08-20 MED ORDER — PHENOL 1.4 % MT LIQD: 1.0000 | OROMUCOSAL | Status: DC | PRN

## 2024-08-20 MED ORDER — SODIUM CHLORIDE 0.9% FLUSH
3.0000 mL | Freq: Two times a day (BID) | INTRAVENOUS | Status: DC
  Administered 2024-08-20 – 2024-08-23 (×6): 3 mL via INTRAVENOUS

## 2024-08-20 MED ORDER — THROMBIN 20000 UNITS EX SOLR
CUTANEOUS | Status: DC | PRN
  Administered 2024-08-20: 20 mL via TOPICAL

## 2024-08-20 NOTE — Anesthesia Procedure Notes (Signed)
 Arterial Line Insertion Start/End1/30/2026 10:00 AM, 08/20/2024 10:09 AM Performed by: Zelphia Norleen HERO, CRNA, CRNA  Patient location: Pre-op. Preanesthetic checklist: patient identified, IV checked, site marked, risks and benefits discussed, surgical consent, monitors and equipment checked, pre-op evaluation, timeout performed and anesthesia consent Lidocaine  1% used for infiltration Left, radial was placed Catheter size: 20 G Hand hygiene performed  and maximum sterile barriers used   Attempts: 1 Procedure performed using ultrasound to evaluate access site. Ultrasound Notes:relevant anatomy identified, ultrasound used to visualize needle entry and vessel patent under ultrasound. Following insertion, dressing applied. Post procedure assessment: normal and unchanged  Patient tolerated the procedure well with no immediate complications.

## 2024-08-20 NOTE — Progress Notes (Addendum)
 Patient admitted to 4N-05 from post-op. A&O X4 with 8/10 back pain. Medications currently being verified by pharmacist. Wife at bedside and personal belongings with patient. VSS and assessment completed. Patient oriented to unit and staff. Hemovac in place. [place] CDI. Cardiac tele applied, call bell within reach, and bed alarm on.

## 2024-08-20 NOTE — Anesthesia Postprocedure Evaluation (Signed)
"   Anesthesia Post Note  Patient: Mark Gehl Reigel Jr.  Procedure(s) Performed: POSTERIOR SPINAL FUSION THORACIC TEN-THORACIC ELEVEN, THORACIC ELEVEN TO THORACIC TWELVE ,THORACIC TWELVE TO LUMBAR ONE, LUMBAR ONE-LUMBAR TWO ,LUMBAR TWO-LUMBAR THREE, LUMBAR THREE-LUMBAR FOUR ; REDO POSTEROLATERAL ARTHRODESIS WITH REMOVAL HARDWARE EXTENTION TO LUMBAR FOUR     Patient location during evaluation: PACU Anesthesia Type: General Level of consciousness: awake and alert Pain management: pain level controlled Vital Signs Assessment: post-procedure vital signs reviewed and stable Respiratory status: spontaneous breathing, nonlabored ventilation and respiratory function stable Cardiovascular status: blood pressure returned to baseline Postop Assessment: no apparent nausea or vomiting Anesthetic complications: no   There were no known notable events for this encounter.  Last Vitals:  Vitals:   08/20/24 1730 08/20/24 1745  BP: 125/77 120/83  Pulse: 79 80  Resp: 20 16  Temp:    SpO2: 91% 92%      Vertell Row      "

## 2024-08-20 NOTE — Transfer of Care (Signed)
 Immediate Anesthesia Transfer of Care Note  Patient: Mark Weatherly Chipley Jr.  Procedure(s) Performed: POSTERIOR SPINAL FUSION THORACIC TEN-THORACIC ELEVEN, THORACIC ELEVEN TO THORACIC TWELVE ,THORACIC TWELVE TO LUMBAR ONE, LUMBAR ONE-LUMBAR TWO ,LUMBAR TWO-LUMBAR THREE, LUMBAR THREE-LUMBAR FOUR ; REDO POSTEROLATERAL ARTHRODESIS WITH REMOVAL HARDWARE EXTENTION TO LUMBAR FOUR  Patient Location: PACU  Anesthesia Type:General  Level of Consciousness: awake  Airway & Oxygen  Therapy: Patient Spontanous Breathing and Patient connected to face mask  Post-op Assessment: Report given to RN  Post vital signs: stable  Last Vitals:  Vitals Value Taken Time  BP 126/80 08/20/24 16:24  Temp    Pulse 85 08/20/24 16:30  Resp 18 08/20/24 16:30  SpO2 96 % 08/20/24 16:30  Vitals shown include unfiled device data.  Last Pain:  Vitals:   08/20/24 0957  TempSrc:   PainSc: 8       Patients Stated Pain Goal: 5 (08/20/24 0957)  Complications: There were no known notable events for this encounter.

## 2024-08-20 NOTE — H&P (Signed)
 [place] LABOR Lopezperez Mickey. is an 64 y.o. male.   Chief Complaint: Back pain HPI: 64 year old gentleman with longstanding issues with his back previously undergone T10-S1 fusion over successive operations.  The last operation was hardware just from T10-L2.  Since then he*developing progressive worsening back pain workup revealed fracture through the pedicle and pseudoarthrosis at L2-3 and due to the patient's progression of clinical syndrome and imaging findings have recommended reexploration fusion with extension of his fusion with new screws placed back in 3 and 4 with redo posterolateral arthrodesis at those those levels.  Inspection of the screws and hardware possible removal of bilateral L2 screws and rod connecting T10 down to L4 I have extensively gone over the risks and benefits of that operation with him as well as perioperative course expectations of outcome and alternatives to surgery and he understands and agrees to proceed forward.  Past Medical History:  Diagnosis Date   Anxiety    chronic pain treated with narcotics   Arthritis    Chronic pain    Constipation due to opioid therapy    COPD (chronic obstructive pulmonary disease) (HCC)    Depression    Dysphonia    thoracic left sidedimpingement with heminumbness   Dyspnea    Elevated hemidiaphragm    on right   GI bleed    abt 15 years ago   Hypertension    Morbid obesity (HCC)    MS (multiple sclerosis) 02/11/2013   ABAGIO  Per. Dr Gretel   Neuromuscular disorder [facility]) 2014   Pneumonia    Preoperative respiratory examination 10/18/2022   Sleep apnea    Sleep apnea with use of continuous positive airway pressure (CPAP) 02/11/2013   07-13-12 AHi of 98.6 /hr titrated to 11 cm water ,  average user time 4 hours and 4 minutes. Residual AHI 1.20 December 2012 .   Thoracic ascending aortic aneurysm    Umbilical hernia     Past Surgical History:  Procedure Laterality Date   ANTERIOR CERVICAL DECOMP/DISCECTOMY FUSION N/A 09/29/2019    Procedure: Anterior Cervical Decompression Fusion - Cervical Four-Cervical Five;  Surgeon: Onetha Kuba, MD;  Location: [facility] OR;  Service: Neurosurgery;  Laterality: N/A;  Anterior Cervical Decompression Fusion - Cervical Four-Cervical Five    CARPAL TUNNEL RELEASE Right    Dr. Onetha CARRY TUNNEL RELEASE Right 09/29/2019   Procedure: Carpal Tunnel Release;  Surgeon: Onetha Kuba, MD;  Location: [facility] OR;  Service: Neurosurgery;  Laterality: Right;   COLONOSCOPY W/ POLYPECTOMY     ESOPHAGOGASTRODUODENOSCOPY     MULTIPLE TOOTH EXTRACTIONS     POSTERIOR LUMBAR FUSION  04/17/2015   Dr. Onetha, also removed old hardware   SPINE SURGERY     total of six vertebras fused, three lumbar surgeries and one anterior neck fusion   TONSILLECTOMY      Family History  Problem Relation Age of Onset   Arrhythmia Mother    Cancer Mother    Cancer Other    Cancer Other    Sleep walking Son    Obesity Sister    Social History:  reports that he has never smoked. He has been exposed to tobacco smoke. He has never used smokeless tobacco. He reports that he does not currently use alcohol. He reports that he does not use drugs.  Allergies: Allergies[1]  Medications Prior to Admission  Medication Sig Dispense Refill   amantadine  (SYMMETREL ) 100 MG capsule Take 100 mg by mouth 3 (three) times daily.  amphetamine -dextroamphetamine  (ADDERALL  XR) 30 MG 24 hr capsule Take 30 mg by mouth every morning.     baclofen  (LIORESAL ) 10 MG tablet Take 10 mg by mouth 3 (three) times daily.     DULoxetine  (CYMBALTA ) 60 MG capsule Take 60 mg by mouth 2 (two) times daily.      famotidine  (PEPCID ) 20 MG tablet Take 1 tablet (20 mg total) by mouth 2 (two) times daily. 60 tablet 3   furosemide  (LASIX ) 20 MG tablet Take 1 tablet (20 mg total) by mouth daily as needed. 90 tablet 1   HYDROcodone -acetaminophen  (NORCO) 10-325 MG tablet Take 1 tablet by mouth every 5 hours as needed for pain, NTE 5/day (DNF 08/12/24) 150 tablet 0    hydrOXYzine  (VISTARIL ) 25 MG capsule Take 1 capsule (25 mg total) by mouth every 8 (eight) hours as needed. 60 capsule 1   losartan -hydrochlorothiazide  (HYZAAR) 50-12.5 MG tablet TAKE ONE TABLET BY MOUTH EVERY DAY 30 tablet 0   morphine  (MS CONTIN ) 60 MG 12 hr tablet take 1 tablet by mouth every 12 hours (DNF 07/21/24) 60 tablet 0   potassium chloride  (KLOR-CON  M) 10 MEQ tablet Take 1 tablet (10 mEq total) by mouth daily. When you take your furosemide . 90 tablet 1   tamsulosin  (FLOMAX ) 0.4 MG CAPS capsule Take 0.4 mg by mouth in the morning and at bedtime.     morphine  (MS CONTIN ) 60 MG 12 hr tablet take 1 tablet by mouth every 12 hours (DNF 05/22/24) (Patient not taking: Reported on 08/10/2024) 60 tablet 0   morphine  (MS CONTIN ) 60 MG 12 hr tablet Take 1 tablet (60 mg total) by mouth every 12 (twelve) hours. 06/21/24 (Patient not taking: Reported on 08/10/2024) 60 tablet 0   morphine  (MS CONTIN ) 60 MG 12 hr tablet Take 1 tablet by mouth every 12 hours (DNF 09/19/24) 60 tablet 0   morphine  (MS CONTIN ) 60 MG 12 hr tablet Take 1 tablet by mouth every 12 hours (DNF 08/20/24) 60 tablet 0   morphine  (MS CONTIN ) 60 MG 12 hr tablet Ttake 1 tablet by mouth every 12 hours (DNF 10/19/24) 60 tablet 0    No results found for this or any previous visit (from the past 48 hours). No results found.  Review of Systems  Musculoskeletal:  Positive for back pain.  Neurological:  Positive for weakness.    Blood pressure (!) 146/82, pulse 77, temperature 98.1 F (36.7 C), temperature source Oral, resp. rate 17, height 6' 1 (1.854 m), weight 131.5 kg, SpO2 98%. Physical Exam HENT:     Head: Normocephalic.     Right Ear: Tympanic membrane normal.     Nose: Nose normal.     Mouth/Throat:     Mouth: Mucous membranes are moist.  Cardiovascular:     Rate and Rhythm: Normal rate.  Pulmonary:     Effort: Pulmonary effort is normal.  Abdominal:     General: Abdomen is flat.  Musculoskeletal:        General: Normal  range of motion.     Cervical back: Normal range of motion.  Neurological:     Mental Status: He is alert.     Comments: Strength is 5 out of 5 iliopsoas, quads, hamstrings, gastrocs, ant tibialis, EHL.      Assessment/Plan 64 year old presents for reexploration fusion removal hardware with extension with placement of bilateral L3-L4 screws  Arley SHAUNNA Helling, MD 08/20/2024, 12:23 PM       [1] No Known Allergies

## 2024-08-20 NOTE — Op Note (Signed)
 Preoperative diagnosis: Pseudoarthrosis L2-3 broken L2 screws and fracture through L3 pedicle.  Postoperative diagnosis: Same.  Procedure: Exploration of fusion removal of hardware with removal and disconnecting rods and knots from T10-L2.  With removal of broken heads of L2 screws and broken rods  2.  Placement bilateral pedicle screws L3-L4 utilizing the Medtronic Legacy 6.35 pedicle screw system.  3.  T10-L4 fixation utilizing the Medtronic rods with previously placed Medtronic screws at L1 new Medtronic screws at L3 and L4 previous globus screws at T10-T11 and T12 and this was globus Revere with a nonthreaded 6.35.  4.  Posterolateral arthrodesis L1-2, L2-3, L3-4, L4-5 utilizing locally harvested autograft mixed with graft on and Magnifuse.  Surgeon: Arley helling.  Assistant: Dorn Ned.  Anesthesia: General.  EBL: 200.  HPI: 64 year old gentleman longstanding issues with his back previous undergone thoracolumbar fusion and had removal of hardware due to solid fusion with extension up to T10 however at some point had a trauma fall had broke his L2 screws and fractured his L 1 2 rod and then progress worsening back pain with fractures across the L3 facet and pedicle.  So due to patient's progression of clinical syndrome imaging findings failed conservative treatment I recommended reexploration fusion move of hardware with redo posterolateral arthrodesis from L1 down to L4 with placement of new screws at L3 and L4.  Extensive went over the risks and benefits of the operation with the patient as well as perioperative course expectations of outcome and alternatives of surgery and he understood and agreed to proceed forward.  Operative procedure: Patient was brought into the OR was induced under general anesthesia positioned prone on the Wilson frame his back was prepped and draped in routine sterile fashion.  His old incision was opened up and the scar tissue was dissected free and I exposed  the hardware from T10 down to L2 expose the pedicle screw entry points and the facet joints and TPs at L3 and L4 then after adequate exposure been achieved under utilizing AP and lateral fluoroscopy I placed new pedicle screws at L3 and L4 with the Medtronic Legacy 6.35 system with 7.5 x 50 screws placed at L3 and L4.  I then disconnected the old construct with removal of the rods removal of knots and removal of the broken screw heads at L2.  We then aggressively decorticated the facet joints residual lamina TPs from L1 down to L4 contoured 2 rods placed to 2 rods anchored everything in place with top tightening knots and placed some autograft L1-L2 3 L3-4 and L4-5 mixed with graft on and Magnifuse.  Then placed a medium Hemovac drain and after meticulous hemostasis was maintained the wound was copiously irrigated prior to graft placement then closed the wound in layers with interrupted Vicryl and a running 4 subcuticular.  Injected Exparel  in the fascia.  Then the wound was dressed with Dermabond benzoin Steri-Strips and sterile dressing placement to cover him in stable condition with the end the case all needle count sponge counts were correct.

## 2024-08-20 NOTE — Anesthesia Procedure Notes (Signed)
 Procedure Name: Intubation Date/Time: 08/20/2024 12:47 PM  Performed by: Theophilus Burnet, Aloysius Pour, CRNAPre-anesthesia Checklist: Patient identified, Emergency Drugs available, Suction available and Patient being monitored Patient Re-evaluated:Patient Re-evaluated prior to induction Oxygen  Delivery Method: Circle system utilized Preoxygenation: Pre-oxygenation with 100% oxygen  Induction Type: IV induction Ventilation: Mask ventilation without difficulty Laryngoscope Size: Glidescope and 4 Grade View: Grade I Tube type: Oral Tube size: 7.5 mm Number of attempts: 1 Airway Equipment and Method: Stylet and Oral airway Placement Confirmation: ETT inserted through vocal cords under direct vision, positive ETCO2 and breath sounds checked- equal and bilateral Secured at: 23 cm Tube secured with: Tape Dental Injury: Teeth and Oropharynx as per pre-operative assessment

## 2024-08-21 MED ORDER — HYDROMORPHONE 1 MG/ML IV SOLN
INTRAVENOUS | Status: DC
  Administered 2024-08-21: 2.3 mg via INTRAVENOUS
  Administered 2024-08-21: 0.5 mg via INTRAVENOUS
  Administered 2024-08-22: 3.6 mg via INTRAVENOUS
  Administered 2024-08-22: 2.4 mg via INTRAVENOUS
  Filled 2024-08-21: qty 30

## 2024-08-21 MED ORDER — NALOXONE HCL 0.4 MG/ML IJ SOLN: 0.4000 mg | INTRAMUSCULAR | Status: DC | PRN

## 2024-08-21 MED ORDER — OXYCODONE HCL 5 MG PO TABS
10.0000 mg | ORAL_TABLET | ORAL | Status: DC | PRN
  Administered 2024-08-21: 10 mg via ORAL
  Filled 2024-08-21: qty 2

## 2024-08-21 MED ORDER — SODIUM CHLORIDE 0.9% FLUSH: 9.0000 mL | INTRAVENOUS | Status: DC | PRN

## 2024-08-21 MED ORDER — KETOROLAC TROMETHAMINE 15 MG/ML IJ SOLN
15.0000 mg | Freq: Four times a day (QID) | INTRAMUSCULAR | Status: DC | PRN
  Administered 2024-08-21: 15 mg via INTRAVENOUS
  Filled 2024-08-21: qty 1

## 2024-08-21 MED ORDER — DIPHENHYDRAMINE HCL 12.5 MG/5ML PO ELIX: 12.5000 mg | ORAL_SOLUTION | Freq: Four times a day (QID) | ORAL | Status: DC | PRN

## 2024-08-21 MED ORDER — PANTOPRAZOLE SODIUM 40 MG PO TBEC
40.0000 mg | DELAYED_RELEASE_TABLET | Freq: Every day | ORAL | Status: DC
  Administered 2024-08-21 – 2024-08-22 (×2): 40 mg via ORAL
  Filled 2024-08-21 (×2): qty 1

## 2024-08-21 MED ORDER — DIPHENHYDRAMINE HCL 50 MG/ML IJ SOLN: 12.5000 mg | Freq: Four times a day (QID) | INTRAMUSCULAR | Status: DC | PRN

## 2024-08-21 MED ORDER — HYDROMORPHONE HCL 1 MG/ML IJ SOLN
1.0000 mg | INTRAMUSCULAR | Status: DC | PRN
  Administered 2024-08-21 – 2024-08-22 (×2): 1 mg via INTRAVENOUS
  Filled 2024-08-21 (×2): qty 1

## 2024-08-21 NOTE — Progress Notes (Addendum)
" °  °  Providing Compassionate, Quality Care - Together   NEUROSURGERY PROGRESS NOTE     S: Incisional pain o/n with drainage noted. Drainage stopped after dressing reinforcement.  O: EXAM:  BP 118/74 (BP Location: Right Arm)   Pulse 74   Temp 98.6 F (37 C) (Oral)   Resp 20   Ht 6' 1 (1.854 m)   Wt 131.5 kg   SpO2 95%   BMI 38.26 kg/m     Awake, alert, oriented  Speech fluent, appropriate  Strength/sensation grossly intact BUE/BLE Dressing c/d/I, dried blood to superior portion. Dressing changed at bedside without acute complication, steri strips intact.  Hemovac in place   ASSESSMENT:  64 y.o. s/p revision T10-L5 PSF, POD#1    PLAN: -Increased pain control regimen -Activity as tolerated -Continue hemovac -Call w/ questions/concerns.   Camie Pickle, PAC  "

## 2024-08-21 NOTE — Progress Notes (Signed)
 Patient started having severe pain on his surgical site. This nurse asked patient to turn so I can assess the insicion site. Patient started bleeding as soon as he turned. Honeycomb dressing got saturated, Dressing was reinforced with ABD pads, secured with tape. Patient eventually stopped bleeding, and started draining on his Hemovac. Lauraine Pickle [facility] was notified via phone. Patient's VS are stable.

## 2024-08-21 NOTE — Progress Notes (Signed)
 Occupational Therapy Evaluation Patient Details Name: Mark [name]. MRN: [id number] DOB: Nov 12, 1960 Today's Date: 08/21/2024   History of Present Illness   64 year old gentleman admitted 08/20/24 with pseudoarthrosis at L2-3. Underwent exploration 1/30 with removal of hardware, place bil pedicle screws L3-4, T10-L4 fixation, arthodesis L1-5 PMH-arthritis, COPD, HTN, morbid obesity, MS, ACDF, lumbar surgeries x3     Clinical Impressions Pt admitted with above. He demonstrates the below listed deficits and will benefit from continued OT to maximize safety and independence with BADLs.  Pt presents with the following impairments: pain, activity tolerance, balance, ADLs, and functional mobility.  PTA, pt lived with wife.  He was able to complete ADLs independently, wife assisted with IADLs.  Wife supportive.  Anticipate he will not require follow up OT at discharge.       If plan is discharge home, recommend the following:   A little help with walking and/or transfers;A little help with bathing/dressing/bathroom;Assistance with cooking/housework;Assist for transportation     Functional Status Assessment   Patient has had a recent decline in their functional status and demonstrates the ability to make significant improvements in function in a reasonable and predictable amount of time.     Equipment Recommendations   None recommended by OT     Recommendations for Other Services         Precautions/Restrictions   Precautions Precautions: Back Precaution Booklet Issued: No Recall of Precautions/Restrictions: Intact Precaution/Restrictions Comments: Pt demonstrates understanding of precautions Required Braces or Orthoses: Spinal Brace Spinal Brace: Lumbar corset;Applied in sitting position Restrictions Weight Bearing Restrictions Per Provider Order: No     Mobility Bed Mobility Overal bed mobility: Needs Assistance Bed Mobility: Rolling, Sidelying to  Sit Rolling: Supervision Sidelying to sit: Supervision            Transfers Overall transfer level: Needs assistance Equipment used: Rolling walker (2 wheels) Transfers: Sit to/from Stand Sit to Stand: Contact guard assist           General transfer comment: verbal cues for walker safety and hand placement      Balance Overall balance assessment: Needs assistance Sitting-balance support: No upper extremity supported, Feet supported Sitting balance-Leahy Scale: Good     Standing balance support: Bilateral upper extremity supported, Reliant on assistive device for balance Standing balance-Leahy Scale: Poor                             ADL either performed or assessed with clinical judgement   ADL Overall ADL's : Needs assistance/impaired Eating/Feeding: Independent   Grooming: Wash/dry hands;Wash/dry face;Oral care;Brushing hair;Standing;Contact guard assist   Upper Body Bathing: Minimal assistance;Sitting   Lower Body Bathing: Maximal assistance;Moderate assistance;Sit to/from stand   Upper Body Dressing : Minimal assistance;Sitting   Lower Body Dressing: Maximal assistance;Sit to/from stand   Toilet Transfer: Minimal assistance;Ambulation;Comfort height toilet;Rolling walker (2 wheels)   Toileting- Clothing Manipulation and Hygiene: Minimal assistance;Sit to/from stand       Functional mobility during ADLs: Contact guard assist;Rolling walker (2 wheels) General ADL Comments: wife present and encouraging     Vision Baseline Vision/History: 0 No visual deficits Ability to See in Adequate Light: 0 Adequate Patient Visual Report: No change from baseline       Perception         Praxis         Pertinent Vitals/Pain Pain Assessment Pain Assessment: 0-10 Pain Score: 8  Pain Location: back Pain Descriptors / Indicators: Operative  site guarding, Aching Pain Intervention(s): Monitored during session, Repositioned, Premedicated before  session     Extremity/Trunk Assessment Upper Extremity Assessment Upper Extremity Assessment: Overall WFL for tasks assessed   Lower Extremity Assessment Lower Extremity Assessment: Generalized weakness   Cervical / Trunk Assessment Cervical / Trunk Assessment: Back Surgery   Communication Communication Communication: No apparent difficulties   Cognition Arousal: Alert Behavior During Therapy: WFL for tasks assessed/performed Cognition: No apparent impairments                               Following commands: Intact       Cueing  General Comments   Cueing Techniques: Verbal cues  VSS on RA throughout session   Exercises     Shoulder Instructions      Home Living Family/patient expects to be discharged to:: Private residence Living Arrangements: Spouse/significant other Available Help at Discharge: Family;Available 24 hours/day Type of Home: Other(Comment) (RV) Home Access: Stairs to enter     Home Layout: Two level;Bed/bath upstairs Alternate Level Stairs-Number of Steps: 2 Alternate Level Stairs-Rails: Right;Left;Can reach both     Bathroom Toilet: Handicapped height     Home Equipment: Cane - single Librarian, Academic (2 wheels);BSC/3in1;Wheelchair - Building Surveyor          Prior Functioning/Environment Prior Level of Function : Needs assist             Mobility Comments: uses cane ADLs Comments: Pt reports he is able to perform ADLs independently - does not wear socks and  wife assist with IADLs.  Pt drives    OT Problem List: Decreased activity tolerance;Impaired balance (sitting and/or standing);Decreased knowledge of use of DME or AE;Decreased knowledge of precautions;Pain   OT Treatment/Interventions: Self-care/ADL training;DME and/or AE instruction;Therapeutic activities;Patient/family education;Balance training      OT Goals(Current goals can be found in the care plan section)   Acute Rehab OT  Goals Patient Stated Goal: Less pain OT Goal Formulation: With patient/family Time For Goal Achievement: 09/03/24 Potential to Achieve Goals: Good   OT Frequency:  Min 2X/week    Co-evaluation PT/OT/SLP Co-Evaluation/Treatment: Yes Reason for Co-Treatment: For patient/therapist safety;To address functional/ADL transfers PT goals addressed during session: Mobility/safety with mobility OT goals addressed during session: ADL's and self-care      AM-PAC OT 6 Clicks Daily Activity     Outcome Measure Help from another person eating meals?: None Help from another person taking care of personal grooming?: A Little Help from another person toileting, which includes using toliet, bedpan, or urinal?: A Little Help from another person bathing (including washing, rinsing, drying)?: A Little Help from another person to put on and taking off regular upper body clothing?: A Little Help from another person to put on and taking off regular lower body clothing?: A Lot 6 Click Score: 18   End of Session Equipment Utilized During Treatment: Rolling walker (2 wheels);Back brace Nurse Communication: Mobility status;Precautions  Activity Tolerance: Patient tolerated treatment well Patient left: in chair;with call bell/phone within reach;with chair alarm set;with family/visitor present  OT Visit Diagnosis: Unsteadiness on feet (R26.81);Pain;Other abnormalities of gait and mobility (R26.89) Pain - part of body:  (back)                Time: 1020-1049 OT Time Calculation (min): 29 min Charges:  OT General Charges $OT Visit: 1 Visit OT Evaluation $OT Eval Moderate Complexity: 1 Mod  Ercel Normoyle C., OTR/L Acute Rehabilitation Services Office  [phone]   Angeline M Dhanvi Boesen 08/21/2024, 11:07 AM

## 2024-08-21 NOTE — Progress Notes (Signed)
 Rt attempted CPAP w/ nasal mask for PT. PT refused and stated he felt that he was suffocating. CPAP taken out of room, order left as PRN. PT does have home mask. Rt made PT aware to bring his own home machine.

## 2024-08-21 NOTE — Progress Notes (Signed)
 PHARMACIST - PHYSICIAN COMMUNICATION  DR:   Onetha  CONCERNING: IV to Oral Route Change Policy  RECOMMENDATION: This patient is receiving Protonix by the intravenous route.  Based on criteria approved by the Pharmacy and Therapeutics Committee, the intravenous medication(s) is/are being converted to the equivalent oral dose form(s).   DESCRIPTION: These criteria include: The patient is eating (either orally or via tube) and/or has been taking other orally administered medications for a least 24 hours The patient has no evidence of active gastrointestinal bleeding or impaired GI absorption (gastrectomy, short bowel, patient on TNA or NPO).  If you have questions about this conversion, please contact the Pharmacy Department  []   [phone] )  [facility] []   [phone] )  [facility] [x]   [phone] )  [facility] []   [phone] )  [facility] []   [phone] )  [facility]

## 2024-08-21 NOTE — Evaluation (Signed)
 Physical Therapy Evaluation Patient Details Name: Mark [name]. MRN: [id number] DOB: 1961/05/10 Today's Date: 08/21/2024  History of Present Illness  64 year old gentleman admitted 08/20/24 with pseudoarthrosis at L2-3. Underwent exploration 1/30 with removal of hardware, place bil pedicle screws L3-4, T10-L4 fixation, arthodesis L1-5 PMH-arthritis, COPD, HTN, morbid obesity, MS, ACDF, lumbar surgeries x3  Clinical Impression   Pt admitted secondary to problem above with deficits below. PTA patient was living in a large RV with his wife. He has one step to enter and 2 steps up to bedroom/bathroom level. He has been using a cane to ambulate and states he falls frequently (1x last month). Pt currently required supervision for bed mobility (with rail), CGA for transfer and ambulation x 150 ft with RW. He requires cues for upright posture to avoid bending.  Anticipate patient will benefit from PT to address problems listed below. Will continue to follow acutely to maximize functional mobility, independence, and safety. Anticipate no followup PT needs or DME needs on discharge.          If plan is discharge home, recommend the following: Assistance with cooking/housework;Assist for transportation;Help with stairs or ramp for entrance   Can travel by private vehicle        Equipment Recommendations None recommended by PT  Recommendations for Other Services       Functional Status Assessment Patient has had a recent decline in their functional status and demonstrates the ability to make significant improvements in function in a reasonable and predictable amount of time.     Precautions / Restrictions Precautions Precautions: Back Precaution Booklet Issued: No Recall of Precautions/Restrictions: Intact Precaution/Restrictions Comments: Pt demonstrates understanding of precautions Required Braces or Orthoses: Spinal Brace Spinal Brace: Lumbar corset;Applied in sitting  position Restrictions Weight Bearing Restrictions Per Provider Order: No      Mobility  Bed Mobility Overal bed mobility: Needs Assistance Bed Mobility: Rolling, Sidelying to Sit Rolling: Supervision, Used rails Sidelying to sit: Supervision, Used rails       General bed mobility comments: no cues for back precautions needed    Transfers Overall transfer level: Needs assistance Equipment used: Rolling walker (2 wheels) Transfers: Sit to/from Stand Sit to Stand: Contact guard assist           General transfer comment: verbal cues for walker safety and hand placement    Ambulation/Gait Ambulation/Gait assistance: Contact guard assist, +2 safety/equipment Gait Distance (Feet): 150 Feet Assistive device: Rolling walker (2 wheels) Gait Pattern/deviations: Step-through pattern, Decreased stride length, Trunk flexed   Gait velocity interpretation: 1.31 - 2.62 ft/sec, indicative of limited community ambulator   General Gait Details: vc repeatedly for proximity to RW and upright posture  Stairs            Wheelchair Mobility     Tilt Bed    Modified Rankin (Stroke Patients Only)       Balance Overall balance assessment: Needs assistance Sitting-balance support: No upper extremity supported, Feet supported Sitting balance-Leahy Scale: Good     Standing balance support: Bilateral upper extremity supported, Reliant on assistive device for balance Standing balance-Leahy Scale: Poor                               Pertinent Vitals/Pain Pain Assessment Pain Assessment: 0-10 Pain Score: 8  Pain Location: back Pain Descriptors / Indicators: Operative site guarding, Aching Pain Intervention(s): Limited activity within patient's tolerance, Monitored during session, Premedicated before session,  Repositioned    Home Living Family/patient expects to be discharged to:: Private residence Living Arrangements: Spouse/significant other Available Help at  Discharge: Family;Available 24 hours/day Type of Home: Other(Comment) (RV) Home Access: Stairs to enter     Alternate Level Stairs-Number of Steps: 2 Home Layout: Two level;Bed/bath upstairs Home Equipment: Cane - single Librarian, Academic (2 wheels);BSC/3in1;Wheelchair - Building Surveyor      Prior Function Prior Level of Function : Needs assist;History of Falls (last six months);Driving             Mobility Comments: uses cane x1.5 yrs due to knee pain and unsteady; last fell ~1 month ago legs just go out from under me ADLs Comments: Pt reports he is able to perform ADLs independently - does not wear socks and  wife assist with IADLs.  Pt drives     Extremity/Trunk Assessment   Upper Extremity Assessment Upper Extremity Assessment: Defer to OT evaluation    Lower Extremity Assessment Lower Extremity Assessment: Generalized weakness    Cervical / Trunk Assessment Cervical / Trunk Assessment: Back Surgery  Communication   Communication Communication: No apparent difficulties    Cognition Arousal: Alert Behavior During Therapy: WFL for tasks assessed/performed                             Following commands: Intact       Cueing Cueing Techniques: Verbal cues     General Comments General comments (skin integrity, edema, etc.): sats at lowest 90% while walking on RA; 95% seated at rest    Exercises     Assessment/Plan    PT Assessment Patient needs continued PT services  PT Problem List Decreased strength;Decreased balance;Decreased mobility;Decreased knowledge of use of DME;Decreased knowledge of precautions;Pain       PT Treatment Interventions DME instruction;Gait training;Stair training;Functional mobility training;Therapeutic activities;Therapeutic exercise;Balance training;Patient/family education    PT Goals (Current goals can be found in the Care Plan section)  Acute Rehab PT Goals Patient Stated Goal: return to his RV;  decreased pain PT Goal Formulation: With patient Time For Goal Achievement: 09/04/24 Potential to Achieve Goals: Good    Frequency Min 5X/week     Co-evaluation PT/OT/SLP Co-Evaluation/Treatment: Yes Reason for Co-Treatment: For patient/therapist safety;To address functional/ADL transfers PT goals addressed during session: Mobility/safety with mobility OT goals addressed during session: ADL's and self-care       AM-PAC PT 6 Clicks Mobility  Outcome Measure Help needed turning from your back to your side while in a flat bed without using bedrails?: A Little Help needed moving from lying on your back to sitting on the side of a flat bed without using bedrails?: A Little Help needed moving to and from a bed to a chair (including a wheelchair)?: A Little Help needed standing up from a chair using your arms (e.g., wheelchair or bedside chair)?: A Little Help needed to walk in hospital room?: A Little Help needed climbing 3-5 steps with a railing? : Total 6 Click Score: 16    End of Session Equipment Utilized During Treatment: Gait belt;Back brace Activity Tolerance: Patient tolerated treatment well Patient left: in chair;with call bell/phone within reach;with chair alarm set;with family/visitor present Nurse Communication: Mobility status;Patient requests pain meds PT Visit Diagnosis: Unsteadiness on feet (R26.81);Other abnormalities of gait and mobility (R26.89);Muscle weakness (generalized) (M62.81);History of falling (Z91.81)    Time: 8975-8951 PT Time Calculation (min) (ACUTE ONLY): 24 min   Charges:   PT Evaluation $  PT Eval Low Complexity: 1 Low   PT General Charges $$ ACUTE PT VISIT: 1 Visit          Macario RAMAN, PT Acute Rehabilitation Services  Office [phone]   Macario SHAUNNA Soja 08/21/2024, 11:49 AM

## 2024-08-22 LAB — GLUCOSE, CAPILLARY: Glucose-Capillary: 111 mg/dL — ABNORMAL HIGH (ref 70–99)

## 2024-08-22 MED ORDER — HYDROCODONE-ACETAMINOPHEN 10-325 MG PO TABS
1.0000 | ORAL_TABLET | ORAL | Status: DC | PRN
  Administered 2024-08-22 – 2024-08-23 (×2): 1 via ORAL
  Filled 2024-08-22 (×2): qty 1

## 2024-08-22 NOTE — Progress Notes (Signed)
 Physical Therapy Treatment Patient Details Name: Mark [name]. MRN: [id number] DOB: [birth year] Today's Date: 08/22/2024   History of Present Illness 64 year old gentleman admitted 08/20/24 with pseudoarthrosis at L2-3. Underwent exploration 1/30 with removal of hardware, place bil pedicle screws L3-4, T10-L4 fixation, arthodesis L1-5 PMH-arthritis, COPD, HTN, morbid obesity, MS, ACDF, lumbar surgeries x3    PT Comments  Patient required cues today to adhere to back precautions (during bed mobility and gait). Tends to push RW too far ahead with flexed posture. Partially corrects with cues and states that's as straight and tall as I can get. Discussed his stairs into and in his RV and he does not feel he will have any problem and agrees we have no good way to replicate how they are setup. Stair goal discontinued.  Needs continued education on back precautions and application of precautions.    If plan is discharge home, recommend the following: Assistance with cooking/housework;Assist for transportation;Help with stairs or ramp for entrance   Can travel by private vehicle        Equipment Recommendations  None recommended by PT    Recommendations for Other Services       Precautions / Restrictions Precautions Precautions: Back Precaution Booklet Issued: No Recall of Precautions/Restrictions: Impaired Precaution/Restrictions Comments: cues to adhere to precautions Required Braces or Orthoses: Spinal Brace Spinal Brace: Lumbar corset;Applied in sitting position Restrictions Weight Bearing Restrictions Per Provider Order: No     Mobility  Bed Mobility Overal bed mobility: Needs Assistance Bed Mobility: Rolling, Sidelying to Sit Rolling: Used rails, Min assist Sidelying to sit: Used rails, Min assist       General bed mobility comments: cues for back precautions needed    Transfers Overall transfer level: Needs assistance Equipment used: Rolling walker (2  wheels) Transfers: Sit to/from Stand Sit to Stand: Contact guard assist           General transfer comment: verbal cues for walker safety and hand placement    Ambulation/Gait Ambulation/Gait assistance: Contact guard assist, +2 safety/equipment Gait Distance (Feet): 150 Feet Assistive device: Rolling walker (2 wheels) Gait Pattern/deviations: Step-through pattern, Decreased stride length, Trunk flexed   Gait velocity interpretation: 1.31 - 2.62 ft/sec, indicative of limited community ambulator   General Gait Details: vc repeatedly for proximity to RW and upright posture   Stairs             Wheelchair Mobility     Tilt Bed    Modified Rankin (Stroke Patients Only)       Balance Overall balance assessment: Needs assistance Sitting-balance support: No upper extremity supported, Feet supported Sitting balance-Leahy Scale: Good     Standing balance support: Bilateral upper extremity supported, Reliant on assistive device for balance Standing balance-Leahy Scale: Poor                              Communication Communication Communication: No apparent difficulties  Cognition Arousal: Alert Behavior During Therapy: WFL for tasks assessed/performed                             Following commands: Intact      Cueing Cueing Techniques: Verbal cues  Exercises      General Comments        Pertinent Vitals/Pain Pain Assessment Pain Assessment: 0-10 Pain Score: 5  Pain Location: back Pain Descriptors / Indicators: Operative site guarding, Aching Pain Intervention(s):  Limited activity within patient's tolerance, Monitored during session, PCA encouraged    Home Living                          Prior Function            PT Goals (current goals can now be found in the care plan section) Acute Rehab PT Goals Patient Stated Goal: return to his RV; decreased pain Time For Goal Achievement: 09/04/24 Potential to Achieve  Goals: Good Progress towards PT goals: Progressing toward goals    Frequency    Min 5X/week      PT Plan      Co-evaluation              AM-PAC PT 6 Clicks Mobility   Outcome Measure  Help needed turning from your back to your side while in a flat bed without using bedrails?: A Little Help needed moving from lying on your back to sitting on the side of a flat bed without using bedrails?: A Little Help needed moving to and from a bed to a chair (including a wheelchair)?: A Little Help needed standing up from a chair using your arms (e.g., wheelchair or bedside chair)?: A Little Help needed to walk in hospital room?: A Little Help needed climbing 3-5 steps with a railing? : A Little 6 Click Score: 18    End of Session Equipment Utilized During Treatment: Gait belt;Back brace Activity Tolerance: Patient tolerated treatment well Patient left: with call bell/phone within reach;with family/visitor present;in bed   PT Visit Diagnosis: Unsteadiness on feet (R26.81);Other abnormalities of gait and mobility (R26.89);Muscle weakness (generalized) (M62.81);History of falling (Z91.81)     Time: 1040-1059 PT Time Calculation (min) (ACUTE ONLY): 19 min  Charges:    $Gait Training: 8-22 mins PT General Charges $$ ACUTE PT VISIT: 1 Visit                      Macario RAMAN, PT Acute Rehabilitation Services  Office [phone]    Macario SHAUNNA Soja 08/22/2024, 11:45 AM

## 2024-08-22 NOTE — Progress Notes (Signed)
" °  °  Providing Compassionate, Quality Care - Together   NEUROSURGERY PROGRESS NOTE     S: No issues overnight.    O: EXAM:  BP 106/65 (BP Location: Right Arm)   Pulse 85   Temp 98.6 F (37 C) (Oral)   Resp 19   Ht 6' 1 (1.854 m)   Wt 131.5 kg   SpO2 90%   BMI 38.26 kg/m     Awake, alert, oriented  Speech fluent, appropriate  Strength/sensation grossly intact BUE/BLE Dressing c/d/I Hemovac in place    ASSESSMENT:  64 y.o. with s/p revision T10-L5 PSF, POD#2     PLAN: -Continue supportive care -Activity as tolerated -Continue hemovac, likely dc tmrw.  -Call w/ questions/concerns.   Camie Pickle, PAC  "

## 2024-08-22 NOTE — Progress Notes (Signed)
 Patient and wife at bedside c/o severe fatigue and stress from PCA pump + SpO2 monitoring alarms/sounds. Requested this RN to ask neurosurgery team if PCA could be discontinued this evening as the patient feels he will be able to manage pain adequately if home medication norco was added to current PRNs. Johnanna PA notified. See new orders.

## 2024-08-22 NOTE — Progress Notes (Signed)
 Order to discontinue PCA pump. 22mg  of dilaudid  PCA injection wasted into designated sales promotion account executive by this [employer]ITY [employer]DRAISER. Lauree HAMS RN as witness.

## 2024-08-22 NOTE — Progress Notes (Signed)
 PT Cancellation Note  Patient Details Name: Mark [name]. MRN: [id number] DOB: 1960-12-14   Cancelled Treatment:    Reason Eval/Treat Not Completed: Other (comment)  Patient already sat up this morning and just got back in bed. Agrees to work with PT later this morning.    Macario RAMAN, PT Acute Rehabilitation Services  Office [phone]  Macario SHAUNNA Soja 08/22/2024, 9:17 AM

## 2024-08-23 MED ORDER — DOXYCYCLINE HYCLATE 100 MG PO TABS: 100.0000 mg | ORAL_TABLET | Freq: Two times a day (BID) | ORAL | 0 refills | Status: AC

## 2024-08-24 NOTE — Progress Notes (Signed)
 Patient discharged, Important Message Letter mailed to patient.

## 2024-12-15 ENCOUNTER — Ambulatory Visit: Admitting: Cardiology

## 2025-02-01 ENCOUNTER — Other Ambulatory Visit (HOSPITAL_COMMUNITY)
